# Patient Record
Sex: Female | Born: 1960 | Race: Black or African American | Hispanic: No | Marital: Married | Smoking: Former smoker
Health system: Southern US, Community
[De-identification: ages and names within clinical notes are randomized; demographics above are authoritative.]

## PROBLEM LIST (undated history)

## (undated) DIAGNOSIS — L309 Dermatitis, unspecified: Secondary | ICD-10-CM

## (undated) DIAGNOSIS — I2699 Other pulmonary embolism without acute cor pulmonale: Secondary | ICD-10-CM

## (undated) DIAGNOSIS — M1990 Unspecified osteoarthritis, unspecified site: Secondary | ICD-10-CM

## (undated) DIAGNOSIS — I10 Essential (primary) hypertension: Secondary | ICD-10-CM

## (undated) DIAGNOSIS — R011 Cardiac murmur, unspecified: Secondary | ICD-10-CM

## (undated) DIAGNOSIS — K5790 Diverticulosis of intestine, part unspecified, without perforation or abscess without bleeding: Secondary | ICD-10-CM

## (undated) DIAGNOSIS — N63 Unspecified lump in unspecified breast: Secondary | ICD-10-CM

## (undated) DIAGNOSIS — D649 Anemia, unspecified: Secondary | ICD-10-CM

## (undated) DIAGNOSIS — I82409 Acute embolism and thrombosis of unspecified deep veins of unspecified lower extremity: Secondary | ICD-10-CM

## (undated) DIAGNOSIS — D259 Leiomyoma of uterus, unspecified: Secondary | ICD-10-CM

## (undated) DIAGNOSIS — D4860 Neoplasm of uncertain behavior of unspecified breast: Secondary | ICD-10-CM

## (undated) HISTORY — DX: Anemia, unspecified: D64.9

## (undated) HISTORY — DX: Unspecified lump in unspecified breast: N63.0

## (undated) HISTORY — DX: Essential (primary) hypertension: I10

## (undated) HISTORY — DX: Other pulmonary embolism without acute cor pulmonale: I26.99

## (undated) HISTORY — DX: Neoplasm of uncertain behavior of unspecified breast: D48.60

## (undated) HISTORY — DX: Acute embolism and thrombosis of unspecified deep veins of unspecified lower extremity: I82.409

## (undated) HISTORY — DX: Leiomyoma of uterus, unspecified: D25.9

## (undated) HISTORY — PX: CHOLECYSTECTOMY: SHX55

---

## 1999-05-05 ENCOUNTER — Encounter: Payer: Self-pay | Admitting: Emergency Medicine

## 1999-05-05 ENCOUNTER — Emergency Department (HOSPITAL_COMMUNITY): Admission: EM | Admit: 1999-05-05 | Discharge: 1999-05-05 | Payer: Self-pay | Admitting: Emergency Medicine

## 1999-07-09 ENCOUNTER — Emergency Department (HOSPITAL_COMMUNITY): Admission: EM | Admit: 1999-07-09 | Discharge: 1999-07-09 | Payer: Self-pay

## 1999-08-21 DIAGNOSIS — D259 Leiomyoma of uterus, unspecified: Secondary | ICD-10-CM

## 1999-08-21 HISTORY — DX: Leiomyoma of uterus, unspecified: D25.9

## 2000-01-04 ENCOUNTER — Ambulatory Visit (HOSPITAL_COMMUNITY): Admission: RE | Admit: 2000-01-04 | Discharge: 2000-01-04 | Payer: Self-pay | Admitting: Gastroenterology

## 2000-01-22 ENCOUNTER — Emergency Department (HOSPITAL_COMMUNITY): Admission: EM | Admit: 2000-01-22 | Discharge: 2000-01-22 | Payer: Self-pay | Admitting: Emergency Medicine

## 2000-01-22 ENCOUNTER — Encounter: Payer: Self-pay | Admitting: Emergency Medicine

## 2000-02-04 ENCOUNTER — Emergency Department (HOSPITAL_COMMUNITY): Admission: EM | Admit: 2000-02-04 | Discharge: 2000-02-05 | Payer: Self-pay | Admitting: Emergency Medicine

## 2000-07-18 ENCOUNTER — Encounter: Payer: Self-pay | Admitting: Emergency Medicine

## 2000-07-18 ENCOUNTER — Emergency Department (HOSPITAL_COMMUNITY): Admission: EM | Admit: 2000-07-18 | Discharge: 2000-07-18 | Payer: Self-pay | Admitting: Emergency Medicine

## 2000-12-27 ENCOUNTER — Encounter: Payer: Self-pay | Admitting: Emergency Medicine

## 2000-12-27 ENCOUNTER — Observation Stay (HOSPITAL_COMMUNITY): Admission: EM | Admit: 2000-12-27 | Discharge: 2000-12-27 | Payer: Self-pay | Admitting: Emergency Medicine

## 2001-04-11 ENCOUNTER — Emergency Department (HOSPITAL_COMMUNITY): Admission: EM | Admit: 2001-04-11 | Discharge: 2001-04-11 | Payer: Self-pay | Admitting: Unknown Physician Specialty

## 2001-09-02 ENCOUNTER — Emergency Department (HOSPITAL_COMMUNITY): Admission: EM | Admit: 2001-09-02 | Discharge: 2001-09-03 | Payer: Self-pay | Admitting: Emergency Medicine

## 2002-01-29 ENCOUNTER — Emergency Department (HOSPITAL_COMMUNITY): Admission: EM | Admit: 2002-01-29 | Discharge: 2002-01-30 | Payer: Self-pay | Admitting: Emergency Medicine

## 2002-02-06 ENCOUNTER — Emergency Department (HOSPITAL_COMMUNITY): Admission: EM | Admit: 2002-02-06 | Discharge: 2002-02-06 | Payer: Self-pay | Admitting: Emergency Medicine

## 2002-03-28 ENCOUNTER — Emergency Department (HOSPITAL_COMMUNITY): Admission: EM | Admit: 2002-03-28 | Discharge: 2002-03-28 | Payer: Self-pay | Admitting: Emergency Medicine

## 2002-03-28 ENCOUNTER — Encounter: Payer: Self-pay | Admitting: Emergency Medicine

## 2002-05-15 ENCOUNTER — Inpatient Hospital Stay (HOSPITAL_COMMUNITY): Admission: EM | Admit: 2002-05-15 | Discharge: 2002-05-16 | Payer: Self-pay | Admitting: Emergency Medicine

## 2002-05-15 ENCOUNTER — Encounter (INDEPENDENT_AMBULATORY_CARE_PROVIDER_SITE_OTHER): Payer: Self-pay | Admitting: Cardiology

## 2002-10-09 ENCOUNTER — Encounter: Payer: Self-pay | Admitting: *Deleted

## 2002-10-09 ENCOUNTER — Emergency Department (HOSPITAL_COMMUNITY): Admission: EM | Admit: 2002-10-09 | Discharge: 2002-10-09 | Payer: Self-pay | Admitting: *Deleted

## 2003-03-08 ENCOUNTER — Emergency Department (HOSPITAL_COMMUNITY): Admission: EM | Admit: 2003-03-08 | Discharge: 2003-03-08 | Payer: Self-pay | Admitting: Emergency Medicine

## 2003-06-10 ENCOUNTER — Encounter: Payer: Self-pay | Admitting: Emergency Medicine

## 2003-06-10 ENCOUNTER — Inpatient Hospital Stay (HOSPITAL_COMMUNITY): Admission: EM | Admit: 2003-06-10 | Discharge: 2003-06-15 | Payer: Self-pay | Admitting: Emergency Medicine

## 2003-06-19 ENCOUNTER — Emergency Department (HOSPITAL_COMMUNITY): Admission: EM | Admit: 2003-06-19 | Discharge: 2003-06-19 | Payer: Self-pay | Admitting: Emergency Medicine

## 2003-06-22 ENCOUNTER — Encounter: Admission: RE | Admit: 2003-06-22 | Discharge: 2003-06-22 | Payer: Self-pay | Admitting: Internal Medicine

## 2003-06-24 ENCOUNTER — Encounter: Admission: RE | Admit: 2003-06-24 | Discharge: 2003-06-24 | Payer: Self-pay | Admitting: Internal Medicine

## 2003-07-01 ENCOUNTER — Encounter: Admission: RE | Admit: 2003-07-01 | Discharge: 2003-07-01 | Payer: Self-pay | Admitting: Internal Medicine

## 2003-07-05 ENCOUNTER — Encounter: Admission: RE | Admit: 2003-07-05 | Discharge: 2003-07-05 | Payer: Self-pay | Admitting: Internal Medicine

## 2003-07-19 ENCOUNTER — Encounter: Admission: RE | Admit: 2003-07-19 | Discharge: 2003-07-19 | Payer: Self-pay | Admitting: Internal Medicine

## 2003-07-29 ENCOUNTER — Ambulatory Visit (HOSPITAL_COMMUNITY): Admission: RE | Admit: 2003-07-29 | Discharge: 2003-07-29 | Payer: Self-pay | Admitting: Internal Medicine

## 2003-07-29 ENCOUNTER — Encounter: Admission: RE | Admit: 2003-07-29 | Discharge: 2003-07-29 | Payer: Self-pay | Admitting: Internal Medicine

## 2003-08-05 ENCOUNTER — Encounter: Admission: RE | Admit: 2003-08-05 | Discharge: 2003-08-05 | Payer: Self-pay | Admitting: Internal Medicine

## 2003-08-19 ENCOUNTER — Encounter: Admission: RE | Admit: 2003-08-19 | Discharge: 2003-08-19 | Payer: Self-pay | Admitting: Internal Medicine

## 2003-09-01 ENCOUNTER — Encounter: Admission: RE | Admit: 2003-09-01 | Discharge: 2003-09-01 | Payer: Self-pay | Admitting: Internal Medicine

## 2003-09-01 ENCOUNTER — Ambulatory Visit (HOSPITAL_COMMUNITY): Admission: RE | Admit: 2003-09-01 | Discharge: 2003-09-01 | Payer: Self-pay | Admitting: Internal Medicine

## 2003-09-02 ENCOUNTER — Encounter: Admission: RE | Admit: 2003-09-02 | Discharge: 2003-09-02 | Payer: Self-pay | Admitting: Internal Medicine

## 2003-09-20 ENCOUNTER — Encounter: Admission: RE | Admit: 2003-09-20 | Discharge: 2003-09-20 | Payer: Self-pay | Admitting: Internal Medicine

## 2003-10-21 ENCOUNTER — Encounter: Admission: RE | Admit: 2003-10-21 | Discharge: 2003-10-21 | Payer: Self-pay | Admitting: Internal Medicine

## 2003-10-27 ENCOUNTER — Emergency Department (HOSPITAL_COMMUNITY): Admission: EM | Admit: 2003-10-27 | Discharge: 2003-10-28 | Payer: Self-pay | Admitting: Emergency Medicine

## 2003-11-19 ENCOUNTER — Other Ambulatory Visit: Admission: RE | Admit: 2003-11-19 | Discharge: 2003-11-19 | Payer: Self-pay | Admitting: *Deleted

## 2003-12-02 ENCOUNTER — Encounter: Admission: RE | Admit: 2003-12-02 | Discharge: 2003-12-02 | Payer: Self-pay | Admitting: Internal Medicine

## 2003-12-20 ENCOUNTER — Encounter: Admission: RE | Admit: 2003-12-20 | Discharge: 2003-12-20 | Payer: Self-pay | Admitting: Internal Medicine

## 2003-12-22 ENCOUNTER — Emergency Department (HOSPITAL_COMMUNITY): Admission: EM | Admit: 2003-12-22 | Discharge: 2003-12-22 | Payer: Self-pay | Admitting: Family Medicine

## 2003-12-29 ENCOUNTER — Encounter: Admission: RE | Admit: 2003-12-29 | Discharge: 2003-12-29 | Payer: Self-pay | Admitting: Internal Medicine

## 2004-01-13 ENCOUNTER — Encounter: Admission: RE | Admit: 2004-01-13 | Discharge: 2004-01-13 | Payer: Self-pay | Admitting: Internal Medicine

## 2004-01-24 ENCOUNTER — Emergency Department (HOSPITAL_COMMUNITY): Admission: EM | Admit: 2004-01-24 | Discharge: 2004-01-25 | Payer: Self-pay | Admitting: Emergency Medicine

## 2004-02-03 ENCOUNTER — Encounter: Admission: RE | Admit: 2004-02-03 | Discharge: 2004-02-03 | Payer: Self-pay | Admitting: Internal Medicine

## 2004-03-13 ENCOUNTER — Encounter: Admission: RE | Admit: 2004-03-13 | Discharge: 2004-03-13 | Payer: Self-pay | Admitting: Internal Medicine

## 2004-03-26 ENCOUNTER — Emergency Department (HOSPITAL_COMMUNITY): Admission: EM | Admit: 2004-03-26 | Discharge: 2004-03-27 | Payer: Self-pay | Admitting: Emergency Medicine

## 2004-05-15 ENCOUNTER — Ambulatory Visit: Payer: Self-pay | Admitting: Internal Medicine

## 2004-06-15 ENCOUNTER — Ambulatory Visit: Payer: Self-pay | Admitting: Internal Medicine

## 2004-07-03 ENCOUNTER — Ambulatory Visit: Payer: Self-pay | Admitting: Internal Medicine

## 2004-07-19 ENCOUNTER — Ambulatory Visit: Payer: Self-pay | Admitting: Internal Medicine

## 2004-08-07 ENCOUNTER — Ambulatory Visit: Payer: Self-pay | Admitting: Internal Medicine

## 2004-09-04 ENCOUNTER — Emergency Department (HOSPITAL_COMMUNITY): Admission: EM | Admit: 2004-09-04 | Discharge: 2004-09-05 | Payer: Self-pay | Admitting: Emergency Medicine

## 2004-09-05 ENCOUNTER — Ambulatory Visit: Payer: Self-pay | Admitting: Internal Medicine

## 2004-10-02 ENCOUNTER — Ambulatory Visit: Payer: Self-pay | Admitting: Internal Medicine

## 2004-11-16 ENCOUNTER — Ambulatory Visit: Payer: Self-pay | Admitting: Internal Medicine

## 2004-11-17 ENCOUNTER — Ambulatory Visit: Payer: Self-pay | Admitting: Internal Medicine

## 2004-11-17 ENCOUNTER — Ambulatory Visit (HOSPITAL_COMMUNITY): Admission: RE | Admit: 2004-11-17 | Discharge: 2004-11-17 | Payer: Self-pay | Admitting: Internal Medicine

## 2004-12-21 ENCOUNTER — Ambulatory Visit: Payer: Self-pay | Admitting: Internal Medicine

## 2005-01-11 ENCOUNTER — Ambulatory Visit (HOSPITAL_COMMUNITY): Admission: RE | Admit: 2005-01-11 | Discharge: 2005-01-11 | Payer: Self-pay | Admitting: Internal Medicine

## 2005-01-11 ENCOUNTER — Ambulatory Visit: Payer: Self-pay | Admitting: Internal Medicine

## 2005-01-18 ENCOUNTER — Ambulatory Visit (HOSPITAL_COMMUNITY): Admission: RE | Admit: 2005-01-18 | Discharge: 2005-01-18 | Payer: Self-pay | Admitting: Internal Medicine

## 2005-02-19 ENCOUNTER — Ambulatory Visit: Payer: Self-pay | Admitting: Internal Medicine

## 2005-03-12 ENCOUNTER — Ambulatory Visit: Payer: Self-pay | Admitting: Internal Medicine

## 2005-04-19 ENCOUNTER — Ambulatory Visit: Payer: Self-pay | Admitting: Internal Medicine

## 2005-06-08 ENCOUNTER — Ambulatory Visit: Payer: Self-pay | Admitting: Internal Medicine

## 2005-06-21 ENCOUNTER — Ambulatory Visit: Payer: Self-pay | Admitting: Internal Medicine

## 2005-06-26 ENCOUNTER — Ambulatory Visit: Payer: Self-pay | Admitting: Internal Medicine

## 2005-07-02 ENCOUNTER — Ambulatory Visit (HOSPITAL_COMMUNITY): Admission: RE | Admit: 2005-07-02 | Discharge: 2005-07-02 | Payer: Self-pay | Admitting: Internal Medicine

## 2005-07-02 ENCOUNTER — Ambulatory Visit: Payer: Self-pay | Admitting: Internal Medicine

## 2005-07-30 ENCOUNTER — Ambulatory Visit: Payer: Self-pay | Admitting: Internal Medicine

## 2005-08-08 ENCOUNTER — Encounter: Admission: RE | Admit: 2005-08-08 | Discharge: 2005-08-19 | Payer: Self-pay | Admitting: Internal Medicine

## 2005-08-08 ENCOUNTER — Emergency Department (HOSPITAL_COMMUNITY): Admission: EM | Admit: 2005-08-08 | Discharge: 2005-08-08 | Payer: Self-pay | Admitting: Emergency Medicine

## 2005-08-30 ENCOUNTER — Ambulatory Visit (HOSPITAL_COMMUNITY): Admission: RE | Admit: 2005-08-30 | Discharge: 2005-08-30 | Payer: Self-pay | Admitting: Internal Medicine

## 2005-08-30 ENCOUNTER — Ambulatory Visit: Payer: Self-pay | Admitting: Internal Medicine

## 2005-09-27 ENCOUNTER — Ambulatory Visit: Payer: Self-pay | Admitting: Internal Medicine

## 2005-10-01 ENCOUNTER — Emergency Department (HOSPITAL_COMMUNITY): Admission: EM | Admit: 2005-10-01 | Discharge: 2005-10-01 | Payer: Self-pay | Admitting: Family Medicine

## 2005-10-27 ENCOUNTER — Emergency Department (HOSPITAL_COMMUNITY): Admission: EM | Admit: 2005-10-27 | Discharge: 2005-10-27 | Payer: Self-pay | Admitting: Emergency Medicine

## 2005-11-01 ENCOUNTER — Ambulatory Visit: Payer: Self-pay | Admitting: Internal Medicine

## 2005-11-15 ENCOUNTER — Ambulatory Visit: Payer: Self-pay | Admitting: Internal Medicine

## 2005-11-26 ENCOUNTER — Ambulatory Visit: Payer: Self-pay | Admitting: Hospitalist

## 2005-12-25 ENCOUNTER — Ambulatory Visit: Payer: Self-pay | Admitting: Internal Medicine

## 2005-12-27 ENCOUNTER — Ambulatory Visit: Payer: Self-pay | Admitting: Internal Medicine

## 2006-01-28 ENCOUNTER — Ambulatory Visit: Payer: Self-pay | Admitting: Internal Medicine

## 2006-03-20 DIAGNOSIS — D4860 Neoplasm of uncertain behavior of unspecified breast: Secondary | ICD-10-CM

## 2006-03-20 HISTORY — DX: Neoplasm of uncertain behavior of unspecified breast: D48.60

## 2006-03-20 HISTORY — PX: MASTECTOMY, PARTIAL: SHX709

## 2006-03-27 ENCOUNTER — Encounter: Admission: RE | Admit: 2006-03-27 | Discharge: 2006-03-27 | Payer: Self-pay | Admitting: General Surgery

## 2006-03-28 ENCOUNTER — Ambulatory Visit (HOSPITAL_BASED_OUTPATIENT_CLINIC_OR_DEPARTMENT_OTHER): Admission: RE | Admit: 2006-03-28 | Discharge: 2006-03-28 | Payer: Self-pay | Admitting: General Surgery

## 2006-03-28 ENCOUNTER — Encounter (INDEPENDENT_AMBULATORY_CARE_PROVIDER_SITE_OTHER): Payer: Self-pay | Admitting: *Deleted

## 2006-05-29 DIAGNOSIS — J40 Bronchitis, not specified as acute or chronic: Secondary | ICD-10-CM | POA: Insufficient documentation

## 2006-05-29 DIAGNOSIS — Z86718 Personal history of other venous thrombosis and embolism: Secondary | ICD-10-CM

## 2006-05-29 DIAGNOSIS — D259 Leiomyoma of uterus, unspecified: Secondary | ICD-10-CM | POA: Insufficient documentation

## 2006-05-29 DIAGNOSIS — D509 Iron deficiency anemia, unspecified: Secondary | ICD-10-CM | POA: Insufficient documentation

## 2006-05-29 DIAGNOSIS — I10 Essential (primary) hypertension: Secondary | ICD-10-CM | POA: Insufficient documentation

## 2006-05-29 HISTORY — DX: Personal history of other venous thrombosis and embolism: Z86.718

## 2006-06-14 ENCOUNTER — Ambulatory Visit: Payer: Self-pay | Admitting: Internal Medicine

## 2006-06-14 ENCOUNTER — Encounter (INDEPENDENT_AMBULATORY_CARE_PROVIDER_SITE_OTHER): Payer: Self-pay | Admitting: Pulmonary Disease

## 2006-06-14 LAB — CONVERTED CEMR LAB
BUN: 12 mg/dL (ref 6–23)
CO2: 24 meq/L (ref 19–32)
Calcium: 9.3 mg/dL (ref 8.4–10.5)
Chloride: 103 meq/L (ref 96–112)
Creatinine, Ser: 0.93 mg/dL (ref 0.40–1.20)
Glucose, Bld: 85 mg/dL (ref 70–99)
Potassium: 3.7 meq/L (ref 3.5–5.3)
Sodium: 138 meq/L (ref 135–145)

## 2006-07-01 ENCOUNTER — Ambulatory Visit: Payer: Self-pay | Admitting: Hospitalist

## 2006-07-10 ENCOUNTER — Ambulatory Visit (HOSPITAL_COMMUNITY): Admission: RE | Admit: 2006-07-10 | Discharge: 2006-07-10 | Payer: Self-pay | Admitting: *Deleted

## 2006-07-24 ENCOUNTER — Emergency Department (HOSPITAL_COMMUNITY): Admission: EM | Admit: 2006-07-24 | Discharge: 2006-07-24 | Payer: Self-pay | Admitting: Emergency Medicine

## 2006-09-15 DIAGNOSIS — N920 Excessive and frequent menstruation with regular cycle: Secondary | ICD-10-CM | POA: Insufficient documentation

## 2006-09-19 ENCOUNTER — Encounter: Admission: RE | Admit: 2006-09-19 | Discharge: 2006-09-19 | Payer: Self-pay | Admitting: General Surgery

## 2006-10-02 ENCOUNTER — Emergency Department (HOSPITAL_COMMUNITY): Admission: EM | Admit: 2006-10-02 | Discharge: 2006-10-02 | Payer: Self-pay | Admitting: Emergency Medicine

## 2006-11-19 ENCOUNTER — Emergency Department (HOSPITAL_COMMUNITY): Admission: EM | Admit: 2006-11-19 | Discharge: 2006-11-19 | Payer: Self-pay | Admitting: Emergency Medicine

## 2006-12-03 ENCOUNTER — Ambulatory Visit: Payer: Self-pay | Admitting: Internal Medicine

## 2006-12-03 DIAGNOSIS — R05 Cough: Secondary | ICD-10-CM

## 2006-12-03 DIAGNOSIS — R058 Other specified cough: Secondary | ICD-10-CM | POA: Insufficient documentation

## 2007-02-13 ENCOUNTER — Inpatient Hospital Stay (HOSPITAL_COMMUNITY): Admission: AD | Admit: 2007-02-13 | Discharge: 2007-02-15 | Payer: Self-pay | Admitting: Hospitalist

## 2007-02-13 ENCOUNTER — Ambulatory Visit: Payer: Self-pay | Admitting: Internal Medicine

## 2007-02-13 ENCOUNTER — Encounter (INDEPENDENT_AMBULATORY_CARE_PROVIDER_SITE_OTHER): Payer: Self-pay | Admitting: *Deleted

## 2007-02-13 DIAGNOSIS — R002 Palpitations: Secondary | ICD-10-CM | POA: Insufficient documentation

## 2007-02-14 ENCOUNTER — Encounter (INDEPENDENT_AMBULATORY_CARE_PROVIDER_SITE_OTHER): Payer: Self-pay | Admitting: Hospitalist

## 2007-02-16 ENCOUNTER — Encounter (INDEPENDENT_AMBULATORY_CARE_PROVIDER_SITE_OTHER): Payer: Self-pay | Admitting: *Deleted

## 2007-02-17 ENCOUNTER — Ambulatory Visit: Payer: Self-pay | Admitting: Internal Medicine

## 2007-02-17 LAB — CONVERTED CEMR LAB: INR: 0.9

## 2007-03-03 ENCOUNTER — Ambulatory Visit: Payer: Self-pay | Admitting: Internal Medicine

## 2007-03-03 LAB — CONVERTED CEMR LAB: INR: 1.5

## 2007-03-10 ENCOUNTER — Ambulatory Visit: Payer: Self-pay | Admitting: Internal Medicine

## 2007-03-10 LAB — CONVERTED CEMR LAB: INR: 1.7

## 2007-03-31 ENCOUNTER — Ambulatory Visit: Payer: Self-pay | Admitting: *Deleted

## 2007-03-31 LAB — CONVERTED CEMR LAB: INR: 1.9

## 2007-04-11 ENCOUNTER — Telehealth: Payer: Self-pay | Admitting: *Deleted

## 2007-04-23 ENCOUNTER — Telehealth: Payer: Self-pay | Admitting: *Deleted

## 2007-04-23 ENCOUNTER — Telehealth (INDEPENDENT_AMBULATORY_CARE_PROVIDER_SITE_OTHER): Payer: Self-pay | Admitting: *Deleted

## 2007-05-05 ENCOUNTER — Ambulatory Visit: Payer: Self-pay | Admitting: Internal Medicine

## 2007-05-05 LAB — CONVERTED CEMR LAB: INR: 1.2

## 2007-05-26 ENCOUNTER — Ambulatory Visit: Payer: Self-pay | Admitting: *Deleted

## 2007-05-26 LAB — CONVERTED CEMR LAB
Free T4: 1.24 ng/dL (ref 0.89–1.80)
TSH: 0.898 microintl units/mL (ref 0.350–5.50)

## 2007-06-27 ENCOUNTER — Telehealth: Payer: Self-pay | Admitting: *Deleted

## 2007-06-30 ENCOUNTER — Ambulatory Visit: Payer: Self-pay | Admitting: Hospitalist

## 2007-06-30 LAB — CONVERTED CEMR LAB: INR: 2.1

## 2007-07-03 ENCOUNTER — Ambulatory Visit: Payer: Self-pay | Admitting: Internal Medicine

## 2007-07-03 ENCOUNTER — Encounter (INDEPENDENT_AMBULATORY_CARE_PROVIDER_SITE_OTHER): Payer: Self-pay | Admitting: Internal Medicine

## 2007-07-03 DIAGNOSIS — R51 Headache: Secondary | ICD-10-CM | POA: Insufficient documentation

## 2007-07-03 DIAGNOSIS — R519 Headache, unspecified: Secondary | ICD-10-CM | POA: Insufficient documentation

## 2007-07-03 LAB — CONVERTED CEMR LAB
ALT: 31 units/L (ref 0–35)
AST: 24 units/L (ref 0–37)
Albumin: 4.3 g/dL (ref 3.5–5.2)
Alkaline Phosphatase: 72 units/L (ref 39–117)
BUN: 9 mg/dL (ref 6–23)
CO2: 23 meq/L (ref 19–32)
Calcium: 9.2 mg/dL (ref 8.4–10.5)
Chloride: 101 meq/L (ref 96–112)
Creatinine, Ser: 1.09 mg/dL (ref 0.40–1.20)
Glucose, Bld: 82 mg/dL (ref 70–99)
Potassium: 3.9 meq/L (ref 3.5–5.3)
Sed Rate: 40 mm/hr — ABNORMAL HIGH (ref 0–22)
Sodium: 138 meq/L (ref 135–145)
TSH: 1.135 microintl units/mL (ref 0.350–5.50)
Total Bilirubin: 0.3 mg/dL (ref 0.3–1.2)
Total Protein: 7.5 g/dL (ref 6.0–8.3)

## 2007-07-15 ENCOUNTER — Telehealth: Payer: Self-pay | Admitting: Internal Medicine

## 2007-08-21 DIAGNOSIS — D649 Anemia, unspecified: Secondary | ICD-10-CM

## 2007-08-21 HISTORY — DX: Anemia, unspecified: D64.9

## 2007-09-15 ENCOUNTER — Ambulatory Visit: Payer: Self-pay | Admitting: Internal Medicine

## 2007-09-15 LAB — CONVERTED CEMR LAB: INR: 1.1

## 2007-09-29 ENCOUNTER — Ambulatory Visit: Payer: Self-pay | Admitting: Internal Medicine

## 2007-09-29 LAB — CONVERTED CEMR LAB: INR: 1.7

## 2007-10-27 ENCOUNTER — Ambulatory Visit: Payer: Self-pay | Admitting: Internal Medicine

## 2007-10-27 LAB — CONVERTED CEMR LAB: INR: 2

## 2007-11-13 ENCOUNTER — Encounter (INDEPENDENT_AMBULATORY_CARE_PROVIDER_SITE_OTHER): Payer: Self-pay | Admitting: Internal Medicine

## 2007-11-13 ENCOUNTER — Ambulatory Visit: Payer: Self-pay | Admitting: *Deleted

## 2007-11-13 DIAGNOSIS — R109 Unspecified abdominal pain: Secondary | ICD-10-CM | POA: Insufficient documentation

## 2007-11-13 DIAGNOSIS — R21 Rash and other nonspecific skin eruption: Secondary | ICD-10-CM | POA: Insufficient documentation

## 2007-11-16 LAB — CONVERTED CEMR LAB
ALT: 29 units/L (ref 0–35)
AST: 21 units/L (ref 0–37)
Albumin: 4.2 g/dL (ref 3.5–5.2)
Alkaline Phosphatase: 62 units/L (ref 39–117)
BUN: 12 mg/dL (ref 6–23)
Basophils Absolute: 0 10*3/uL (ref 0.0–0.1)
Basophils Relative: 0 % (ref 0–1)
CO2: 25 meq/L (ref 19–32)
Calcium: 9.2 mg/dL (ref 8.4–10.5)
Chloride: 104 meq/L (ref 96–112)
Creatinine, Ser: 1.05 mg/dL (ref 0.40–1.20)
Eosinophils Absolute: 0.1 10*3/uL (ref 0.0–0.7)
Eosinophils Relative: 2 % (ref 0–5)
Glucose, Bld: 80 mg/dL (ref 70–99)
HCT: 34.5 % — ABNORMAL LOW (ref 36.0–46.0)
Hemoglobin: 11.2 g/dL — ABNORMAL LOW (ref 12.0–15.0)
Lipase: 28 units/L (ref 0–75)
Lymphocytes Relative: 44 % (ref 12–46)
Lymphs Abs: 2.2 10*3/uL (ref 0.7–4.0)
MCHC: 32.5 g/dL (ref 30.0–36.0)
MCV: 85.2 fL (ref 78.0–100.0)
Monocytes Absolute: 0.4 10*3/uL (ref 0.1–1.0)
Monocytes Relative: 7 % (ref 3–12)
Neutro Abs: 2.4 10*3/uL (ref 1.7–7.7)
Neutrophils Relative %: 47 % (ref 43–77)
Platelets: 353 10*3/uL (ref 150–400)
Potassium: 3.7 meq/L (ref 3.5–5.3)
RBC: 4.05 M/uL (ref 3.87–5.11)
RDW: 14.8 % (ref 11.5–15.5)
Sodium: 139 meq/L (ref 135–145)
Total Bilirubin: 0.5 mg/dL (ref 0.3–1.2)
Total Protein: 7.5 g/dL (ref 6.0–8.3)
WBC: 5 10*3/uL (ref 4.0–10.5)

## 2007-11-29 ENCOUNTER — Emergency Department (HOSPITAL_COMMUNITY): Admission: EM | Admit: 2007-11-29 | Discharge: 2007-11-29 | Payer: Self-pay | Admitting: Emergency Medicine

## 2007-12-01 ENCOUNTER — Encounter (INDEPENDENT_AMBULATORY_CARE_PROVIDER_SITE_OTHER): Payer: Self-pay | Admitting: *Deleted

## 2008-02-02 ENCOUNTER — Ambulatory Visit: Payer: Self-pay | Admitting: Internal Medicine

## 2008-02-02 LAB — CONVERTED CEMR LAB: INR: 2.4

## 2008-02-17 ENCOUNTER — Telehealth (INDEPENDENT_AMBULATORY_CARE_PROVIDER_SITE_OTHER): Payer: Self-pay | Admitting: Internal Medicine

## 2008-02-18 DIAGNOSIS — N63 Unspecified lump in unspecified breast: Secondary | ICD-10-CM

## 2008-02-18 HISTORY — DX: Unspecified lump in unspecified breast: N63.0

## 2008-03-01 ENCOUNTER — Inpatient Hospital Stay (HOSPITAL_COMMUNITY): Admission: EM | Admit: 2008-03-01 | Discharge: 2008-03-02 | Payer: Self-pay | Admitting: Emergency Medicine

## 2008-03-01 ENCOUNTER — Ambulatory Visit: Payer: Self-pay | Admitting: Cardiovascular Disease

## 2008-03-02 ENCOUNTER — Encounter: Payer: Self-pay | Admitting: Cardiovascular Disease

## 2008-03-08 ENCOUNTER — Ambulatory Visit: Payer: Self-pay | Admitting: Internal Medicine

## 2008-03-08 ENCOUNTER — Encounter (INDEPENDENT_AMBULATORY_CARE_PROVIDER_SITE_OTHER): Payer: Self-pay | Admitting: Internal Medicine

## 2008-03-08 ENCOUNTER — Ambulatory Visit: Payer: Self-pay | Admitting: *Deleted

## 2008-03-08 DIAGNOSIS — N63 Unspecified lump in unspecified breast: Secondary | ICD-10-CM | POA: Insufficient documentation

## 2008-03-08 LAB — CONVERTED CEMR LAB
ALT: 21 units/L (ref 0–35)
AST: 16 units/L (ref 0–37)
Albumin: 4.1 g/dL (ref 3.5–5.2)
Alkaline Phosphatase: 64 units/L (ref 39–117)
BUN: 9 mg/dL (ref 6–23)
CO2: 25 meq/L (ref 19–32)
Calcium: 9 mg/dL (ref 8.4–10.5)
Chloride: 104 meq/L (ref 96–112)
Creatinine, Ser: 1.06 mg/dL (ref 0.40–1.20)
Glucose, Bld: 76 mg/dL (ref 70–99)
INR: 2.1
Potassium: 3.5 meq/L (ref 3.5–5.3)
Sodium: 140 meq/L (ref 135–145)
Total Bilirubin: 0.2 mg/dL — ABNORMAL LOW (ref 0.3–1.2)
Total Protein: 6.9 g/dL (ref 6.0–8.3)

## 2008-03-11 ENCOUNTER — Encounter: Admission: RE | Admit: 2008-03-11 | Discharge: 2008-03-11 | Payer: Self-pay | Admitting: Internal Medicine

## 2008-03-22 ENCOUNTER — Ambulatory Visit: Payer: Self-pay | Admitting: Infectious Diseases

## 2008-03-22 LAB — CONVERTED CEMR LAB: INR: 2.8

## 2008-03-26 ENCOUNTER — Ambulatory Visit (HOSPITAL_COMMUNITY): Admission: RE | Admit: 2008-03-26 | Discharge: 2008-03-26 | Payer: Self-pay | Admitting: Obstetrics & Gynecology

## 2008-04-05 ENCOUNTER — Ambulatory Visit: Payer: Self-pay

## 2008-04-05 ENCOUNTER — Encounter (INDEPENDENT_AMBULATORY_CARE_PROVIDER_SITE_OTHER): Payer: Self-pay | Admitting: Internal Medicine

## 2008-04-16 ENCOUNTER — Ambulatory Visit: Payer: Self-pay

## 2008-05-24 ENCOUNTER — Ambulatory Visit: Payer: Self-pay | Admitting: Internal Medicine

## 2008-05-24 LAB — CONVERTED CEMR LAB: INR: 1

## 2008-07-19 ENCOUNTER — Ambulatory Visit: Payer: Self-pay | Admitting: Internal Medicine

## 2008-07-19 LAB — CONVERTED CEMR LAB: INR: 1.7

## 2008-07-22 ENCOUNTER — Telehealth: Payer: Self-pay | Admitting: *Deleted

## 2008-10-10 ENCOUNTER — Encounter: Payer: Self-pay | Admitting: Emergency Medicine

## 2008-10-11 ENCOUNTER — Observation Stay (HOSPITAL_COMMUNITY): Admission: EM | Admit: 2008-10-11 | Discharge: 2008-10-12 | Payer: Self-pay | Admitting: Internal Medicine

## 2008-10-11 ENCOUNTER — Ambulatory Visit: Payer: Self-pay | Admitting: Internal Medicine

## 2008-10-11 ENCOUNTER — Encounter: Payer: Self-pay | Admitting: *Deleted

## 2008-10-11 LAB — CONVERTED CEMR LAB
Cholesterol: 166 mg/dL
HDL: 37 mg/dL
LDL Cholesterol: 116 mg/dL
Triglycerides: 67 mg/dL

## 2008-10-12 ENCOUNTER — Encounter: Payer: Self-pay | Admitting: *Deleted

## 2008-10-13 ENCOUNTER — Telehealth (INDEPENDENT_AMBULATORY_CARE_PROVIDER_SITE_OTHER): Payer: Self-pay | Admitting: Internal Medicine

## 2008-10-14 ENCOUNTER — Ambulatory Visit: Payer: Self-pay | Admitting: Internal Medicine

## 2008-10-14 ENCOUNTER — Encounter (INDEPENDENT_AMBULATORY_CARE_PROVIDER_SITE_OTHER): Payer: Self-pay | Admitting: Internal Medicine

## 2008-10-14 LAB — CONVERTED CEMR LAB: INR: 1.6

## 2008-10-15 ENCOUNTER — Encounter: Payer: Self-pay | Admitting: *Deleted

## 2008-10-18 ENCOUNTER — Ambulatory Visit: Payer: Self-pay | Admitting: Internal Medicine

## 2008-10-18 LAB — CONVERTED CEMR LAB: INR: 1.7

## 2008-11-08 ENCOUNTER — Ambulatory Visit: Payer: Self-pay | Admitting: Internal Medicine

## 2008-11-08 LAB — CONVERTED CEMR LAB: INR: 3.8

## 2009-03-28 ENCOUNTER — Ambulatory Visit: Payer: Self-pay | Admitting: Internal Medicine

## 2009-03-28 LAB — CONVERTED CEMR LAB: INR: 1

## 2009-04-05 ENCOUNTER — Ambulatory Visit: Payer: Self-pay | Admitting: Internal Medicine

## 2009-04-05 ENCOUNTER — Encounter (INDEPENDENT_AMBULATORY_CARE_PROVIDER_SITE_OTHER): Payer: Self-pay | Admitting: Internal Medicine

## 2009-04-05 DIAGNOSIS — M2550 Pain in unspecified joint: Secondary | ICD-10-CM | POA: Insufficient documentation

## 2009-04-07 LAB — CONVERTED CEMR LAB
ALT: 55 units/L — ABNORMAL HIGH (ref 0–35)
AST: 30 units/L (ref 0–37)
Albumin: 4.2 g/dL (ref 3.5–5.2)
Alkaline Phosphatase: 63 units/L (ref 39–117)
Anti Nuclear Antibody(ANA): NEGATIVE
BUN: 13 mg/dL (ref 6–23)
CO2: 19 meq/L (ref 19–32)
Calcium: 9.5 mg/dL (ref 8.4–10.5)
Chloride: 105 meq/L (ref 96–112)
Creatinine, Ser: 1.01 mg/dL (ref 0.40–1.20)
Glucose, Bld: 76 mg/dL (ref 70–99)
Potassium: 3.7 meq/L (ref 3.5–5.3)
Rhuematoid fact SerPl-aCnc: 21 intl units/mL — ABNORMAL HIGH (ref 0–20)
Sodium: 141 meq/L (ref 135–145)
Total Bilirubin: 0.4 mg/dL (ref 0.3–1.2)
Total Protein: 7.3 g/dL (ref 6.0–8.3)

## 2009-04-12 ENCOUNTER — Encounter: Admission: RE | Admit: 2009-04-12 | Discharge: 2009-04-12 | Payer: Self-pay | Admitting: Internal Medicine

## 2009-04-12 LAB — HM MAMMOGRAPHY

## 2009-04-20 ENCOUNTER — Encounter (INDEPENDENT_AMBULATORY_CARE_PROVIDER_SITE_OTHER): Payer: Self-pay | Admitting: Internal Medicine

## 2009-05-05 ENCOUNTER — Encounter (INDEPENDENT_AMBULATORY_CARE_PROVIDER_SITE_OTHER): Payer: Self-pay | Admitting: Internal Medicine

## 2009-05-12 LAB — HM COLONOSCOPY

## 2010-01-11 ENCOUNTER — Ambulatory Visit: Payer: Self-pay | Admitting: Internal Medicine

## 2010-01-11 LAB — CONVERTED CEMR LAB
ALT: 21 units/L (ref 0–35)
AST: 22 units/L (ref 0–37)
Albumin: 4 g/dL (ref 3.5–5.2)
Alkaline Phosphatase: 51 units/L (ref 39–117)
Anti Nuclear Antibody(ANA): NEGATIVE
BUN: 10 mg/dL (ref 6–23)
CO2: 25 meq/L (ref 19–32)
Calcium: 8.9 mg/dL (ref 8.4–10.5)
Chloride: 101 meq/L (ref 96–112)
Creatinine, Ser: 0.86 mg/dL (ref 0.40–1.20)
Glucose, Bld: 76 mg/dL (ref 70–99)
INR: 1.4
Potassium: 3.6 meq/L (ref 3.5–5.3)
Rhuematoid fact SerPl-aCnc: <20 intl units/mL (ref 0–20)
Sed Rate: 23 mm/hr — ABNORMAL HIGH (ref 0–22)
Sodium: 136 meq/L (ref 135–145)
Total Bilirubin: 0.3 mg/dL (ref 0.3–1.2)
Total Protein: 6.9 g/dL (ref 6.0–8.3)

## 2010-01-12 ENCOUNTER — Telehealth (INDEPENDENT_AMBULATORY_CARE_PROVIDER_SITE_OTHER): Payer: Self-pay | Admitting: Pharmacist

## 2010-03-07 ENCOUNTER — Ambulatory Visit: Payer: Self-pay | Admitting: Internal Medicine

## 2010-03-07 ENCOUNTER — Encounter: Payer: Self-pay | Admitting: Internal Medicine

## 2010-03-07 LAB — CONVERTED CEMR LAB
ALT: 22 units/L (ref 0–35)
AST: 18 units/L (ref 0–37)
Albumin: 3.6 g/dL (ref 3.5–5.2)
Alkaline Phosphatase: 54 units/L (ref 39–117)
BUN: 10 mg/dL (ref 6–23)
Basophils Absolute: 0 10*3/uL (ref 0.0–0.1)
Basophils Relative: 0 % (ref 0–1)
CO2: 28 meq/L (ref 19–32)
Calcium: 9.4 mg/dL (ref 8.4–10.5)
Chloride: 100 meq/L (ref 96–112)
Creatinine, Ser: 0.91 mg/dL (ref 0.40–1.20)
Eosinophils Absolute: 0.1 10*3/uL (ref 0.0–0.7)
Eosinophils Relative: 1 % (ref 0–5)
Glucose, Bld: 79 mg/dL (ref 70–99)
HCT: 33.5 % — ABNORMAL LOW (ref 36.0–46.0)
Hemoglobin: 11.3 g/dL — ABNORMAL LOW (ref 12.0–15.0)
INR: 0.91 (ref <1.50)
Lymphocytes Relative: 44 % (ref 12–46)
Lymphs Abs: 2.7 10*3/uL (ref 0.7–4.0)
MCHC: 33.8 g/dL (ref 30.0–36.0)
MCV: 91.8 fL (ref >=78.0)
Monocytes Absolute: 0.5 10*3/uL (ref 0.1–1.0)
Monocytes Relative: 7 % (ref 3–12)
Neutro Abs: 3 10*3/uL (ref 1.7–7.7)
Neutrophils Relative %: 48 % (ref 43–77)
Platelets: 271 10*3/uL (ref 150–400)
Potassium: 3.5 meq/L (ref 3.5–5.3)
Prothrombin Time: 12.2 s (ref 11.6–15.2)
RBC: 3.65 M/uL — ABNORMAL LOW (ref 3.87–5.11)
RDW: 15.1 % (ref 11.5–15.5)
Sodium: 135 meq/L (ref 135–145)
Total Bilirubin: 0.4 mg/dL (ref 0.3–1.2)
Total Protein: 7 g/dL (ref 6.0–8.3)
WBC: 6.3 10*3/uL (ref 4.0–10.5)
aPTT: 28 s (ref 24–37)

## 2010-03-16 ENCOUNTER — Ambulatory Visit (HOSPITAL_COMMUNITY): Admission: RE | Admit: 2010-03-16 | Discharge: 2010-03-16 | Payer: Self-pay | Admitting: Obstetrics

## 2010-03-20 ENCOUNTER — Encounter: Admission: RE | Admit: 2010-03-20 | Discharge: 2010-03-20 | Payer: Self-pay | Admitting: Obstetrics

## 2010-03-29 ENCOUNTER — Telehealth: Payer: Self-pay | Admitting: Internal Medicine

## 2010-03-29 ENCOUNTER — Encounter: Payer: Self-pay | Admitting: Internal Medicine

## 2010-04-05 ENCOUNTER — Ambulatory Visit: Payer: Self-pay | Admitting: Internal Medicine

## 2010-04-13 ENCOUNTER — Encounter: Payer: Self-pay | Admitting: Obstetrics

## 2010-04-13 ENCOUNTER — Inpatient Hospital Stay (HOSPITAL_COMMUNITY): Admission: RE | Admit: 2010-04-13 | Discharge: 2010-04-21 | Payer: Self-pay | Admitting: Obstetrics

## 2010-04-13 HISTORY — PX: ABDOMINAL HYSTERECTOMY: SHX81

## 2010-04-21 ENCOUNTER — Telehealth: Payer: Self-pay | Admitting: Internal Medicine

## 2010-04-23 ENCOUNTER — Telehealth: Payer: Self-pay | Admitting: Internal Medicine

## 2010-04-24 ENCOUNTER — Telehealth (INDEPENDENT_AMBULATORY_CARE_PROVIDER_SITE_OTHER): Payer: Self-pay | Admitting: Pharmacist

## 2010-04-25 ENCOUNTER — Telehealth (INDEPENDENT_AMBULATORY_CARE_PROVIDER_SITE_OTHER): Payer: Self-pay | Admitting: Pharmacist

## 2010-04-27 ENCOUNTER — Telehealth (INDEPENDENT_AMBULATORY_CARE_PROVIDER_SITE_OTHER): Payer: Self-pay | Admitting: Pharmacist

## 2010-09-09 DIAGNOSIS — Z7901 Long term (current) use of anticoagulants: Secondary | ICD-10-CM

## 2010-09-09 DIAGNOSIS — Z86718 Personal history of other venous thrombosis and embolism: Secondary | ICD-10-CM

## 2010-09-10 ENCOUNTER — Encounter: Payer: Self-pay | Admitting: Internal Medicine

## 2010-09-19 NOTE — Assessment & Plan Note (Signed)
Summary: 261.CFB  Anticoagulant Therapy Managed by: Barbera Setters. Janie Morning  PharmD CACP PCP: Charlaine Dalton Attending: Lowella Bandy MD Indication 1: Deep vein thrombus Indication 2: Encounter for therapeutic drug monitoring  V58.83 Start date: 07/01/2003 Duration: Indefinite  Patient Assessment Reviewed by: Chancy Milroy PharmD  November 08, 2008 Medication review: verified warfarin dosage & schedule,verified previous prescription medications, verified doses & any changes, verified new medications, reviewed OTC medications, reviewed OTC health products-vitamins supplements etc Complications: none Dietary changes: none   Health status changes: none   Lifestyle changes: none   Recent/future hospitalizations: none   Recent/future procedures: none   Recent/future dental: none Patient Assessment Part 2:  Have you MISSED ANY DOSES or CHANGED TABLETS?  No missed Warfarin doses or changed tablets.  Have you had any BRUISING or BLEEDING ( nose or gum bleeds,blood in urine or stool)?  No reported bruising or bleeding in nose, gums, urine, stool.  Have you STARTED or STOPPED any MEDICATIONS, including OTC meds,herbals or supplements?  No other medications or herbal supplements were started or stopped.  Have you CHANGED your DIET, especially green vegetables,or ALCOHOL intake?  No changes in diet or alcohol intake.  Have you had any ILLNESSES or HOSPITALIZATIONS?  No reported illnesses or hospitalizations  Have you had any signs of CLOTTING?(chest discomfort,dizziness,shortness of breath,arms tingling,slurred speech,swelling or redness in leg)    No chest discomfort, dizziness, shortness of breath, tingling in arm, slurred speech, swelling, or redness in leg.     Treatment  Target INR: 2.0-3.0 INR: 3.8  Date: 11/08/2008 Regimen In:  155.0mg /week INR reflects regimen in: 3.8  New  Tablet strength: : 10mg  Regimen Out:     Sunday: 2 Tablet     Monday: 2 Tablet     Tuesday: 2 Tablet  Wednesday: 2 & 1/2 Tablet     Thursday: 2 Tablet      Friday: 2 Tablet     Saturday: 2 Tablet Total Weekly: 145.0mg /week mg  Next INR Due: 12/06/2008 Adjusted by: Barbera Setters. Alexandria Lodge III PharmD CACP   Return to anticoagulation clinic:  12/06/2008 Time of next visit: 1615

## 2010-09-19 NOTE — Assessment & Plan Note (Signed)
Summary: 261/CFB  Anticoagulant Therapy Managed by: Barbera Setters. Janie Morning  PharmD CACP PCP: Charlaine Dalton Attending: Lowella Bandy MD Indication 1: Deep vein thrombus Indication 2: Encounter for therapeutic drug monitoring  V58.83 Start date: 07/01/2003 Duration: Indefinite  Patient Assessment Reviewed by: Chancy Milroy PharmD  September 15, 2007 Medication review: verified warfarin dosage & schedule,verified previous prescription medications, verified doses & any changes, verified new medications, reviewed OTC medications, reviewed OTC health products-vitamins supplements etc Complications: none Dietary changes: none   Health status changes: none   Lifestyle changes: none   Recent/future hospitalizations: none   Recent/future procedures: none   Recent/future dental: none Patient Assessment Part 2:  Have you MISSED ANY DOSES or CHANGED TABLETS?  No missed Warfarin doses or changed tablets.  Have you had any BRUISING or BLEEDING ( nose or gum bleeds,blood in urine or stool)?  No reported bruising or bleeding in nose, gums, urine, stool.  Have you STARTED or STOPPED any MEDICATIONS, including OTC meds,herbals or supplements?  No other medications or herbal supplements were started or stopped.  Have you CHANGED your DIET, especially green vegetables,or ALCOHOL intake?  No changes in diet or alcohol intake.  Have you had any ILLNESSES or HOSPITALIZATIONS?  No reported illnesses or hospitalizations  Have you had any signs of CLOTTING?(chest discomfort,dizziness,shortness of breath,arms tingling,slurred speech,swelling or redness in leg)    No chest discomfort, dizziness, shortness of breath, tingling in arm, slurred speech, swelling, or redness in leg.     Treatment  Target INR: 2.0-3.0 INR: 1.1  Date: 09/15/2007 Regimen In:  155.0mg /week INR reflects regimen in: 1.1  New  Tablet strength: : 10mg  Regimen Out:     Sunday: 2 & 1/2 Tablet     Monday: 2 & 1/2 Tablet     Tuesday:  2 & 1/2 Tablet     Wednesday: 2 & 1/2 Tablet     Thursday: 2 & 1/2 Tablet      Friday: 2 & 1/2 Tablet     Saturday: 2 & 1/2 Tablet Total Weekly: 175.0mg /week mg  Next INR Due: 09/29/2007 Adjusted by: Barbera Setters. Alexandria Lodge III PharmD CACP   Return to anticoagulation clinic:  09/29/2007 Time of next visit: 1400

## 2010-09-19 NOTE — Assessment & Plan Note (Signed)
Summary: FU/ SB.   Vital Signs:  Patient Profile:   50 Years Old Female Height:     71 inches (180.34 cm) Weight:      273.1 pounds (124.14 kg) BMI:     38.23 Temp:     98.0 degrees F (36.67 degrees C) oral Pulse rate:   89 / minute BP sitting:   137 / 81  (right arm)  Pt. in pain?   no  Vitals Entered By: Chinita Pester RN (November 13, 2007 9:41 AM)              Is Patient Diabetic? No Nutritional Status BMI of > 30 = obese  Have you ever been in a relationship where you felt threatened, hurt or afraid?No   Does patient need assistance? Functional Status Self care Ambulation Normal     PCP:  Shannan Harper  Chief Complaint:  After eating; have abd pain.  History of Present Illness: 50 yo F who presents with some abd pain.  Sometimes when she eats about 10-15 minutes later she has abd pain.  She has a hx of fibroids.  She get nauseated but she doesn't thrown up.  It only happens when she eats and then takes her meds.  She eats first and then the takes her medicines - coumadin, maxzide and iron.  This has been going on for 2-3 months.  She only takes 1 pill of iron per day.  She has no hx of PUD or ulcers. She does have a hx of GERD, but she's not on any meds.  She's been on iron for 3 years.  Her coumadin was incresed about 3-4 months ago.  She always has black stools, most of the time they are formed, but sometimes loose and sometimes tarry.  No blood in urine and no abnormal vaginal bleeding.  She has her GB out.  The pain is located in the midepigastric area, but also down in a line from there to her lower abd.  The pain does not radiate.  She gets some SOB with the pain, no diaphoresis, she feels "drained" with the pain.  The pain lasts about 1-2 minutes and is severe and described as a sharp pain.  It's 10/10 at it's worst.  It's only associated with dinner which is when she takes her meds.  She can eat any other time without taking her meds and she has no pain.  But when she eats,  then takes her meds, she has the pain. She does not smoke or drink or use any illicit drugs. She has no pain now, nor has she eaten or drank anything yet today. She states she's had eczema since she was a child.  The rash she has on her body is sometimes itchy, is always present, never painful.  She sometimes gets it on her ears and face, but she does not get it on the insides of her elbows or behind the knees.    Updated Prior Medication List: MAXZIDE-25 37.5-25 MG TABS (TRIAMTERENE-HCTZ) Take 1 tablet by mouth once a day FERROUS SULFATE 325 (65 FE) MG TABS (FERROUS SULFATE) Take 1 tablet by mouth once a day COUMADIN 10 MG TAB (WARFARIN SODIUM) Take as directed. COUMADIN 5 MG TAB (WARFARIN SODIUM) Take as directed.  Current Allergies (reviewed today): No known allergies   Past Medical History:    Reviewed history from 05/29/2006 and no changes required:       Obesity       Hypertension  H/o multiple episodes of PE, DVTs 2004       Family h/o colon cancer       Bronchitis       H/o Dizziness, MRI/MRA neg, ENT f/u pen dec 2007       H/o menorrhagia with h/o fibroids       H/o iron def anemialast Hb 10.2 3/07       Left partial mastectomy with fibrocystic, phylloids tumour changes       S/p colonoscopy 2001 internal hemorrhois, no polyps       H/o self limited episode of Afib 2003  Past Surgical History:    Reviewed history from 05/29/2006 and no changes required:       Left partial mastectomy with fibrocystic, phylloids tumour changes    Risk Factors: Tobacco use:  never Passive smoke exposure:  no Drug use:  no HIV high-risk behavior:  no Alcohol use:  no Exercise:  no Seatbelt use:  100 %  Family History Risk Factors:    Family History of MI in females < 71 years old:  no    Family History of MI in males < 57 years old:  no   Review of Systems      See HPI   Physical Exam  General:     Well-developed,well-nourished,in no acute distress; alert,appropriate  and cooperative throughout examination Lungs:     Clear Heart:     normal rate, regular rhythm, and no murmur.   Abdomen:     soft, non-tender, normal bowel sounds, no distention, no masses, no guarding, no rigidity, and no rebound tenderness, obese.   Extremities:     No edema Neurologic:     alert & oriented X3.   Skin:     Small to medium sized patchy lesions on her chest, elbows and arms.  The areas appear dry and are flaky/scaly.  The skin on her elbows is thick and whitish. Cervical Nodes:     no anterior cervical adenopathy and no posterior cervical adenopathy.   Axillary Nodes:     no R axillary adenopathy and no L axillary adenopathy.   Psych:     Appropriate    Impression & Recommendations:  Problem # 1:  ABDOMINAL PAIN (ICD-789.00) Most likely related to the timing of her meds.  I suggested she try taking her meds before she eats to see if this would help her pain.  If anything, the iron pills would be the most likely culprit.  She was told to hold her iron if changing the order in which she takes her meds does not help relieve the pain.  Will check LFTs and a lipase to be complete.  No urgent need for imaging at this time as she is pain-free.  It may also be considered that she could have a retained CBD stone (despite being s/p chole) which can cause abd pain after eating, so the next step would be an abd [place] if needed.  Will also check a CBC to see if she needs to be on iron anymore.  Orders: T-Comprehensive Metabolic Panel [phone]) T-CBC w/Diff [phone]) T-Lipase [phone])   Problem # 2:  SKIN RASH (ICD-782.1) The pt was also seen by Dr. Aundria Rud and he and I do not think the rash she has is eczema - it is not in the usual places and looks more like psoriasis.  Will refer to dermatology for a more definitive dx and tx. Future Orders: Dermatology Referral (Derma) ... 11/25/2007  Problem # 3:  HYPERTENSION (ICD-401.9) BP at goal.  No changes made.  Will  check Cr and K today. Her updated medication list for this problem includes:    Maxzide-25 37.5-25 Mg Tabs (Triamterene-hctz) .Marland Kitchen... Take 1 tablet by mouth once a day  BP today: 137/81 Prior BP: 125/80 (07/03/2007)  Labs Reviewed: Creat: 1.09 (07/03/2007)   Complete Medication List: 1)  Maxzide-25 37.5-25 Mg Tabs (Triamterene-hctz) .... Take 1 tablet by mouth once a day 2)  Ferrous Sulfate 325 (65 Fe) Mg Tabs (Ferrous sulfate) .... Take 1 tablet by mouth once a day 3)  Coumadin 10 Mg Tab (Warfarin sodium) .... Take as directed. 4)  Coumadin 5 Mg Tab (Warfarin sodium) .... Take as directed.   Patient Instructions: 1)  Please schedule a follow-up appointment as needed. 2)  Try taking your medicines BEFORE you eat your meal. 3)  You will be called with any abnormalities in your bloodwork from today. 4)  You will be called with an appointment to see a Dermatologist, but if you don't hear from [place] in the next 2 weeks, call us back.    ]

## 2010-09-19 NOTE — Progress Notes (Signed)
Summary: Telemagagement of OP warfarin s/p dischg from hosp over Labor Da  Phone Note From Other Clinic   Caller: Nurse Call For: [name] PharmD CACP Reason for Call: Schedule Patient Appt, Medication Check, Diagnosis Check Request: Talk with Provider, Call Patient Action Taken: Phone Call Completed, Provider Notified, Patient called Details for Reason: HHA RN Called reporting INR = 2.85 on alt 15mg /10mg  warfarin every other day since discharge from hospital. Details of Complaint: Oozing at [facility] site. Dr. Gwenlyn Perking decreased dose yesterday to 10mg  warfarin by mouth once daily until seen in [facility] on Tuesday 6-Sep-11. Details of Action Taken: Patient called and apprised the same. Summary of Call: Telemanagement of OP warfarin s/p discharge from hospital over Labor Day weekend Initial call taken by: [name] PharmD

## 2010-09-19 NOTE — Assessment & Plan Note (Signed)
Summary: PATIENT COMING IN AT 2:00/ SB.  Anticoagulant Therapy Managed by: Barbera Setters. Janie Morning  PharmD CACP PCP: Charlaine Dalton Attending: Clydie Braun MD Indication 1: Deep vein thrombus Indication 2: Encounter for therapeutic drug monitoring  V58.83 Start date: 07/01/2003 Duration: Indefinite  Patient Assessment Reviewed by: Chancy Milroy PharmD  March 22, 2008 Medication review: verified warfarin dosage & schedule,verified previous prescription medications, verified doses & any changes, verified new medications, reviewed OTC medications, reviewed OTC health products-vitamins supplements etc Complications: none Dietary changes: none   Health status changes: none   Lifestyle changes: none   Recent/future hospitalizations: none   Recent/future procedures: none   Recent/future dental: none Patient Assessment Part 2:  Have you MISSED ANY DOSES or CHANGED TABLETS?  No missed Warfarin doses or changed tablets.  Have you had any BRUISING or BLEEDING ( nose or gum bleeds,blood in urine or stool)?  No reported bruising or bleeding in nose, gums, urine, stool.  Have you STARTED or STOPPED any MEDICATIONS, including OTC meds,herbals or supplements?  No other medications or herbal supplements were started or stopped.  Have you CHANGED your DIET, especially green vegetables,or ALCOHOL intake?  No changes in diet or alcohol intake.  Have you had any ILLNESSES or HOSPITALIZATIONS?  No reported illnesses or hospitalizations  Have you had any signs of CLOTTING?(chest discomfort,dizziness,shortness of breath,arms tingling,slurred speech,swelling or redness in leg)    No chest discomfort, dizziness, shortness of breath, tingling in arm, slurred speech, swelling, or redness in leg.     Treatment  Target INR: 2.0-3.0 INR: 2.8  Date: 03/22/2008 Regimen In:  120.0mg /week INR reflects regimen in: 2.8  New  Tablet strength: : 10mg  Next INR Due: 04/19/2008 Adjusted by: Barbera Setters. Alexandria Lodge III  PharmD CACP   Return to anticoagulation clinic:  04/19/2008 Time of next visit: 1445

## 2010-09-19 NOTE — Assessment & Plan Note (Signed)
Summary: medical clearance for surgery/gg  see last note   Vital Signs:  Patient profile:   50 year old female Height:      71 inches (180.34 cm) Weight:      254.0 pounds (115.45 kg) BMI:     35.55 Temp:     97.6 degrees F (36.44 degrees C) oral Pulse rate:   64 / minute BP sitting:   152 / 96  (left arm) Cuff size:   large  Vitals Entered By: Cynda Familia Duncan Dull) (April 05, 2010 2:55 PM) CC: med refill, out of bp meds, discuss upcoming surgery Is Patient Diabetic? No Nutritional Status BMI of > 30 = obese  Have you ever been in a relationship where you felt threatened, hurt or afraid?No   Does patient need assistance? Functional Status Self care Ambulation Normal   Primary Care Natalynn Pedone:  Zara Council MD  CC:  med refill, out of bp meds, and discuss upcoming surgery.  History of Present Illness: 50 y/o female with pmh as described in the EMR; who come to the clinic to discuss upcoming hysterectomy and to get refills on her BP meds.  Patient is really upset and altered due to the surgery, all the social changes coming in her life and also due to the cost and expenses she is passing through.   Patient is compliant with her medications and denies any CP, SOB, HA, nausea, vomiting, melena, hematochezia, blurred vision or any other complaints.  Patient BP is elevated today due to been out of her medications and also due to all the stress that she is dealing with at this time.  Patient had a medical clearance visit for her surgery during prior visit and will be seen again for pre-surgery labs on Monday next week.  Preventive Screening-Counseling & Management  Alcohol-Tobacco     Smoking Status: never     Year Quit: years     Passive Smoke Exposure: no  Problems Prior to Update: 1)  Pulmonary Embolism, Hx of  (ICD-V12.51) 2)  Hypertension  (ICD-401.9) 3)  Anemia-iron Deficiency  (ICD-280.9) 4)  Arthralgia  (ICD-719.40) 5)  Preventive Health Care  (ICD-V70.0) 6)   Lump or Mass in Breast  (ICD-611.72) 7)  Abdominal Pain  (ICD-789.00) 8)  Skin Rash  (ICD-782.1) 9)  Headache  (ICD-784.0) 10)  Hx of Palpitations  (ICD-785.1) 11)  Cough  (ICD-786.2) 12)  Menorrhagia  (ICD-626.2) 13)  Fibroids, Uterus  (ICD-218.9) 14)  Obesity Nos  (ICD-278.00) 15)  Bronchitis Nos  (ICD-490)  Current Problems (verified): 1)  Pulmonary Embolism, Hx of  (ICD-V12.51) 2)  Hypertension  (ICD-401.9) 3)  Anemia-iron Deficiency  (ICD-280.9) 4)  Arthralgia  (ICD-719.40) 5)  Preventive Health Care  (ICD-V70.0) 6)  Lump or Mass in Breast  (ICD-611.72) 7)  Abdominal Pain  (ICD-789.00) 8)  Skin Rash  (ICD-782.1) 9)  Headache  (ICD-784.0) 10)  Hx of Palpitations  (ICD-785.1) 11)  Cough  (ICD-786.2) 12)  Menorrhagia  (ICD-626.2) 13)  Fibroids, Uterus  (ICD-218.9) 14)  Obesity Nos  (ICD-278.00) 15)  Bronchitis Nos  (ICD-490)  Medications Prior to Update: 1)  Maxzide-25 37.5-25 Mg Tabs (Triamterene-Hctz) .... Take 1 Tablet By Mouth Once A Day 2)  Ferrous Sulfate 325 (65 Fe) Mg Tabs (Ferrous Sulfate) .... Take 1 Tablet By Mouth Once A Day 3)  Cvs Omeprazole 20 Mg Tbec (Omeprazole) .... Take One Tablet By Mouth Once Daily 4)  Warfarin Sodium 10 Mg Tabs (Warfarin Sodium) .... Tablet Strength: 10mg  Take As  Directed 5)  Proventil Hfa 108 (90 Base) Mcg/act  Aers (Albuterol Sulfate) .... 2 Puffs As Required For Breathing Problem.  Current Medications (verified): 1)  Maxzide-25 37.5-25 Mg Tabs (Triamterene-Hctz) .... Take 1 Tablet By Mouth Once A Day 2)  Ferrous Sulfate 325 (65 Fe) Mg Tabs (Ferrous Sulfate) .... Take 1 Tablet By Mouth Once A Day 3)  Cvs Omeprazole 20 Mg Tbec (Omeprazole) .... Take One Tablet By Mouth Once Daily 4)  Warfarin Sodium 10 Mg Tabs (Warfarin Sodium) .... Tablet Strength: 10mg  Take As Directed 5)  Proventil Hfa 108 (90 Base) Mcg/act  Aers (Albuterol Sulfate) .... 2 Puffs As Required For Breathing Problem.  Allergies (verified): No Known Drug  Allergies  Past History:  Past Medical History: Last updated: 05/29/2006 Obesity Hypertension H/o multiple episodes of PE, DVTs 2004 Family h/o colon cancer Bronchitis H/o Dizziness, MRI/MRA neg, ENT f/u pen dec 2007 H/o menorrhagia with h/o fibroids H/o iron def anemialast Hb 10.2 3/07 Left partial mastectomy with fibrocystic, phylloids tumour changes S/p colonoscopy 2001 internal hemorrhois, no polyps H/o self limited episode of Afib 2003  Past Surgical History: Last updated: 05/29/2006 Left partial mastectomy with fibrocystic, phylloids tumour changes  Risk Factors: Exercise: no (03/07/2010)  Risk Factors: Smoking Status: never (04/05/2010) Passive Smoke Exposure: no (04/05/2010)  Review of Systems       As per HPI.  Physical Exam  General:  alert, well-developed, well-nourished, and well-hydrated.   Lungs:  normal respiratory effort and normal breath sounds.   Heart:  normal rate and regular rhythm.   Abdomen:  soft and non-tender.   Neurologic:  alert & oriented X3, cranial nerves II-XII intact, strength normal in all extremities, and gait normal.     Impression & Recommendations:  Problem # 1:  PULMONARY EMBOLISM, HX OF (ICD-V12.51) Patient will continue tx for her PE and increased coagulation risk with coumadin long-life tx. Therapy would be follow by Dr. Alexandria Lodge in the coumadin clinic as soons as she had her hysterectomy done. Patient 's Gynecologist has been advised to start therapy with lovenox post surgery and to start dual therapy with coumadin priro to discharge, during follow up will determine if her INR is therapeutic and stable to stop the lovenox.  Her updated medication list for this problem includes:    Warfarin Sodium 10 Mg Tabs (Warfarin sodium) .Marland Kitchen... Tablet strength: 10mg  take as directed  Orders: No Charge Patient Arrived (NCPA0) (NCPA0)  Problem # 2:  ANEMIA-IRON DEFICIENCY (ICD-280.9) Secondary to menorrhagia due to fibroids. Patient will  received hysterectomy per Dr. Clearance Coots.  Her updated medication list for this problem includes:    Ferrous Sulfate 325 (65 Fe) Mg Tabs (Ferrous sulfate) .Marland Kitchen... Take 1 tablet by mouth once a day  Problem # 3:  HYPERTENSION (ICD-401.9) BP milddly elevated today, due to stress of current situation and alo because she ran out of her meds. will continue current regimen, will refill her meds and will ask her to follow a low sodium diet.  Her updated medication list for this problem includes:    Maxzide-25 37.5-25 Mg Tabs (Triamterene-hctz) .Marland Kitchen... Take 1 tablet by mouth once a day  Orders: No Charge Patient Arrived (NCPA0) (NCPA0)  Problem # 4:  FIBROIDS, UTERUS (ICD-218.9) Patient will received her hysterectomy next week by Dr. Clearance Coots. Hgb, electrolytes, renal function and coagulation panel within normal limits. She also had a recent EKG and cardiac enzymes check during last admission and everything was normal. She would also received pre-surgical visit during her appointmnet  on Monday, because of that will hold on any other studies at this time.  Orders: No Charge Patient Arrived (NCPA0) (NCPA0)  Complete Medication List: 1)  Maxzide-25 37.5-25 Mg Tabs (Triamterene-hctz) .... Take 1 tablet by mouth once a day 2)  Ferrous Sulfate 325 (65 Fe) Mg Tabs (Ferrous sulfate) .... Take 1 tablet by mouth once a day 3)  Cvs Omeprazole 20 Mg Tbec (Omeprazole) .... Take one tablet by mouth once daily 4)  Warfarin Sodium 10 Mg Tabs (Warfarin sodium) .... Tablet strength: 10mg  take as directed 5)  Proventil Hfa 108 (90 Base) Mcg/act Aers (Albuterol sulfate) .... 2 puffs as required for breathing problem.  Patient Instructions: 1)  Please schedule a follow-up appointment in 2 month. 2)  Follow with your gynecology appoinment. 3)  Take your meds as prescribed.

## 2010-09-19 NOTE — Consult Note (Signed)
Summary: [facility] [id number]  [facility] [id number]   Imported By: Florinda Marker 12/03/2007 15:17:36  _____________________________________________________________________  External Attachment:    Type:   Image     Comment:   External Document

## 2010-09-19 NOTE — Assessment & Plan Note (Signed)
Summary: STOMACH PAIN/SB/DS(HEAD)/DS   Vital Signs:  Patient Profile:   50 Years Old Female Height:     71 inches (180.34 cm) Weight:      277.0 pounds (125.91 kg) BMI:     38.77 Temp:     97.8 degrees F (36.56 degrees C) oral Pulse rate:   80 / minute BP sitting:   137 / 88  (right arm) Cuff size:   large  Pt. in pain?   yes    Location:   abdomen    Intensity:   6    Type:       ? UPSET  Vitals Entered By: Theotis Barrio NT II (February 02, 2008 2:38 PM)              Is Patient Diabetic? No Nutritional Status BMI of > 30 = obese  Have you ever been in a relationship where you felt threatened, hurt or afraid?No   Does patient need assistance? Functional Status Self care Ambulation Normal     PCP:  Shannan Harper  Chief Complaint:  ABD PAIN  FOR ABOUT 2 WEEKS .  History of Present Illness: This is a 50 year old woman with a past medical history of Obesity Hypertension H/o multiple episodes of PE, DVTs 2004 Family h/o colon cancer Bronchitis H/o Dizziness, MRI/MRA neg, ENT f/u pen dec 2007 H/o menorrhagia with h/o fibroids H/o iron def anemialast Hb 10.2 3/07 Left partial mastectomy with fibrocystic, phylloids tumour changes S/p colonoscopy 2001 internal hemorrhois, no polyps H/o self limited episode of Afib 2003   who is coming in today for a complaint of abdominal pain.  She has had this complaint for about 6 months now.  It started as mid-abdominal/epigastric pain only after dinner and thought to be from her medicine (she was always taking them before dinenr), but her pain has now progressed over the past few weeks to almost all day, not made worse or better by food.  It feels like "acidic" pain.  It has no radiation.  It is associated with nausea, but no vomiting, no regurgitation, no weight loss, no hematemesis, melena or hematochezia.  She also complains of worsening constipation for the past few weeks.  She has no fever.    Prior Medications Reviewed Using:  Patient Recall  Prior Medication List:  MAXZIDE-25 37.5-25 MG TABS (TRIAMTERENE-HCTZ) Take 1 tablet by mouth once a day FERROUS SULFATE 325 (65 FE) MG TABS (FERROUS SULFATE) Take 1 tablet by mouth once a day COUMADIN 10 MG TAB (WARFARIN SODIUM) Take as directed. COUMADIN 5 MG TAB (WARFARIN SODIUM) Take as directed.   Current Allergies (reviewed today): No known allergies     Risk Factors: Tobacco use:  never Passive smoke exposure:  no Drug use:  no HIV high-risk behavior:  no Alcohol use:  no Exercise:  no Seatbelt use:  100 %  Family History Risk Factors:    Family History of MI in females < 56 years old:  no    Family History of MI in males < 66 years old:  no   Review of Systems  General      Complains of loss of appetite.      Denies chills, fatigue, fever, and weight loss.  CV      Denies chest pain or discomfort and shortness of breath with exertion.  Resp      Denies cough and shortness of breath.  GI      See HPI  GU  Complains of abnormal vaginal bleeding.      Her menorrhagia is getting worse.  Known for uterine fibroids.   Physical Exam  General:     alert, well-developed, well-nourished, and well-hydrated.   Head:     normocephalic and atraumatic.   Eyes:     vision grossly intact.   Mouth:     pharynx pink and moist.   Lungs:     Normal respiratory effort, chest expands symmetrically. Lungs are clear to auscultation, no crackles or wheezes. Heart:     Normal rate and regular rhythm. S1 and S2 normal without gallop, murmur, click, rub or other extra sounds. Abdomen:     soft, no masses, no guarding, no rigidity, and bowel sounds hypoactive.   Has some tenderness in epigastrium and left upper quadrant, and some discomfort in mid abdomen.  Palpable stool. Extremities:     No clubbing, cyanosis, edema, or deformity noted with normal full range of motion of all joints.   Neurologic:     alert & oriented X3.      Impression &  Recommendations:  Problem # 1:  ABDOMINAL PAIN (ICD-789.00) Gastritis vs peptic ulcer, +/- constipation.  Will do a trial of omeprazole 40 mg two times a day for 30 days, and also suggested Miralax powder or other OTC laxatives for her constipation.  If her pain is not resolved in a month or if she develops any red flag signs (weight loss, rectal bleeding), will refer her to GI for endoscopy.  Problem # 2:  MENORRHAGIA (ICD-626.2) Refer to GYN. Orders: Gynecologic Referral (Gyn)   Complete Medication List: 1)  Maxzide-25 37.5-25 Mg Tabs (Triamterene-hctz) .... Take 1 tablet by mouth once a day 2)  Ferrous Sulfate 325 (65 Fe) Mg Tabs (Ferrous sulfate) .... Take 1 tablet by mouth once a day 3)  Coumadin 10 Mg Tab (Warfarin sodium) .... Take as directed. 4)  Coumadin 5 Mg Tab (Warfarin sodium) .... Take as directed. 5)  Omeprazole 40 Mg Cpdr (Omeprazole) .... Take 1 capsule by mouth two times a day   Patient Instructions: 1)  Please schedule a follow-up appointment in 1 month to see if your abdominal pain has responded to the medication   Prescriptions: OMEPRAZOLE 40 MG  CPDR (OMEPRAZOLE) Take 1 capsule by mouth two times a day  #60 x 2   Entered and Authorized by:   Ruben Reason. Candis Musa MD   Signed by:   Ruben Reason. Candis Musa MD on 02/02/2008   Method used:   Print then Give to Patient   RxID:   [id number]  ]

## 2010-09-19 NOTE — Progress Notes (Signed)
Summary: refill/ hla  Phone Note Refill Request Message from:  Fax from Pharmacy on July 15, 2007 2:04 PM  Refills Requested: Medication #1:  MAXZIDE-25 37.5-25 MG TABS Take 1 tablet by mouth once a day   Last Refilled: 10/10 Initial call taken by: Marin Roberts RN,  July 15, 2007 2:04 PM  Follow-up for Phone Call        Refill approved-nurse to complete Follow-up by: Ulyess Mort MD,  July 15, 2007 3:33 PM      Prescriptions: MAXZIDE-25 37.5-25 MG TABS (TRIAMTERENE-HCTZ) Take 1 tablet by mouth once a day  #30 x 4   Entered and Authorized by:   Ulyess Mort MD   Signed by:   Ulyess Mort MD on 07/15/2007   Method used:   Telephoned to ...       [facility] [phone]*       [address] [place], [place]  [address]       Ph: [phone] or [phone]       Fax: [phone]   RxID:   [id number]

## 2010-09-19 NOTE — Miscellaneous (Signed)
Clinical Lists Changes Hospital Discharge  Date of admission: 10/11/08  Date of discharge: 10/12/08  Brief reason for admission/active problems: Epigastric pain of unclear etiology and noncardiac chest pain.  Also, pt was subtherapeutic with regards to her chronic coumadin therapy.   Followup needed:  Pt will f/u with Dr.Groce on 10/14/08 for a PT/INR.  She will need a minimum of 5 days treatment of Lovenox, and 2 days of overlap once she becomes therapeutic on her coumadin.    The medication and problem lists have been updated.  Please see the dictated discharge summary for details.  Problems: Assessed PULMONARY EMBOLISM, HX OF as comment only - Pt was noncompliant with her coumadin and with her Dr. Alexandria Lodge follow-ups.  No documented DVT or PE during this hospitalization, and D-Dimer negative.  However, pt will be bridged with Lovenox as she is in needed of life-long anticoagulation 2/2 3 prior PEs.  Her updated medication list for this problem includes:    Warfarin Sodium 10 Mg Tabs (Warfarin sodium) .Marland Kitchen... Tablet strength: 10mg  take as directed    Lovenox 120 Mg/0.63ml Soln (Enoxaparin sodium) ..... Inject 115mg  subcutaneously twice daily  Medications: Added new medication of LOVENOX 120 MG/0.8ML SOLN (ENOXAPARIN SODIUM) Inject 115mg  subcutaneously twice daily - Signed Changed medication from OMEPRAZOLE 40 MG  CPDR (OMEPRAZOLE) Take 1 capsule by mouth two times a day to [facility] OMEPRAZOLE 20 MG TBEC (OMEPRAZOLE) Take one tablet by mouth once daily Rx of LOVENOX 120 MG/0.8ML SOLN (ENOXAPARIN SODIUM) Inject 115mg  subcutaneously twice daily;  #1 x 0;  Signed;  Entered by: Genia Del MD;  Authorized by: Genia Del MD;  Method used: Electronically to [facility] [phone]*, [address] [place], [place]  [address], Ph: [phone] or [phone], Fax: [phone] Observations: Added new observation of COUM DOSE: N/A (10/12/2008 12:30) Added new observation of COUM TOT WK: N/A (10/12/2008  12:30)    Prescriptions: LOVENOX 120 MG/0.8ML SOLN (ENOXAPARIN SODIUM) Inject 115mg  subcutaneously twice daily  #1 x 0   Entered and Authorized by:   Genia Del MD   Signed by:   Genia Del MD on 10/12/2008   Method used:   Electronically to        [facility] [phone]* (retail)       [address] [place], [place]  [address]       Ph: [phone] or [phone]       Fax: [phone]   RxID:   [id number]    Impression & Recommendations:  Problem # 1:  PULMONARY EMBOLISM, HX OF (ICD-V12.51) Pt was noncompliant with her coumadin and with her Dr. Alexandria Lodge follow-ups.  No documented DVT or PE during this hospitalization, and D-Dimer negative.  However, pt will be bridged with Lovenox as she is in needed of life-long anticoagulation 2/2 3 prior PEs.  Her updated medication list for this problem includes:    Warfarin Sodium 10 Mg Tabs (Warfarin sodium) .Marland Kitchen... Tablet strength: 10mg  take as directed    Lovenox 120 Mg/0.58ml Soln (Enoxaparin sodium) ..... Inject 115mg  subcutaneously twice daily   Complete Medication List: 1)  Maxzide-25 37.5-25 Mg Tabs (Triamterene-hctz) .... Take 1 tablet by mouth once a day 2)  Ferrous Sulfate 325 (65 Fe) Mg Tabs (Ferrous sulfate) .... Take 1 tablet by mouth once a day 3)  [facility] Omeprazole 20 Mg Tbec (Omeprazole) .... Take one tablet by mouth once daily 4)  Warfarin Sodium 10 Mg Tabs (Warfarin  sodium) .... Tablet strength: 10mg  take as directed 5)  Proventil Hfa 108 (90 Base) Mcg/act Aers (Albuterol sulfate) .... 2 puffs as required for breathing problem. 6)  Lovenox 120 Mg/0.38ml Soln (Enoxaparin sodium) .... Inject 115mg  subcutaneously twice daily  Anticoagulation Management Assessment/Plan:       Anticoagulation Management History:      Her last INR was 1.7.    Appended Document:  Pt is taking Coumadin 15mg  once daily at time of discharge.  This will be adjusted at time of follow-up with Dr. Alexandria Lodge.

## 2010-09-19 NOTE — Assessment & Plan Note (Signed)
Summary: COU/CH  Anticoagulant Therapy Managed by: Barbera Setters. Alexandria Lodge III  PharmD CACP PCP: Shannan Harper [facility] Attending: Chauncey Reading DO Indication 1: Deep vein thrombus Indication 2: Encounter for therapeutic drug monitoring  V58.83 Start date: 07/01/2003 Duration: Indefinite  Patient Assessment Reviewed by: Chancy Milroy PharmD  October 14, 2008 Medication review: verified warfarin dosage & schedule,verified previous prescription medications, verified doses & any changes, verified new medications, reviewed OTC medications, reviewed OTC health products-vitamins supplements etc Complications: none Dietary changes: none   Health status changes: none   Lifestyle changes: none   Recent/future hospitalizations: yes  Details: Admitted to hospital with questionable pneumonia/"shadow" on CT/CXRAY---for which the team commenced her upon treatment doses of LMWH b/c upon admission her INR was noted to be subtherapeutic. She frequently IS subtherapeutic because of suspected and even volunteered admission of non-compliance--often due to difficulty getting/paying for her medications she states. Recent/future procedures: none   Recent/future dental: none Patient Assessment Part 2:  Have you MISSED ANY DOSES or CHANGED TABLETS?  No missed Warfarin doses or changed tablets.  Have you had any BRUISING or BLEEDING ( nose or gum bleeds,blood in urine or stool)?  No reported bruising or bleeding in nose, gums, urine, stool.  Have you STARTED or STOPPED any MEDICATIONS, including OTC meds,herbals or supplements?  No other medications or herbal supplements were started or stopped.  Have you CHANGED your DIET, especially green vegetables,or ALCOHOL intake?  No changes in diet or alcohol intake.  Have you had any ILLNESSES or HOSPITALIZATIONS?  Yes. Discharged from hospital on 23-Feb-10.  Have you had any signs of CLOTTING?(chest discomfort,dizziness,shortness of breath,arms tingling,slurred speech,swelling or  redness in leg)    No chest discomfort, dizziness, shortness of breath, tingling in arm, slurred speech, swelling, or redness in leg.     Treatment  Target INR: 2.0-3.0 INR: 1.6  Date: 10/14/2008 Regimen In:  N/A INR reflects regimen in: 1.6  New  Tablet strength: : 10mg  Next INR Due: 10/18/2008 Adjusted by: Barbera Setters. Alexandria Lodge III PharmD CACP   Return to anticoagulation clinic:  10/18/2008 Time of next visit: 1430   Comments: Will dispense to her another 3 days of LMWH. Increase her warfarin dosing to previous dose (prior to admission) and repeat INR on 1-Mar-10

## 2010-09-19 NOTE — Consult Note (Signed)
Summary: [facility]  [facility]   Imported By: Florinda Marker 04/22/2009 15:26:25  _____________________________________________________________________  External Attachment:    Type:   Image     Comment:   External Document

## 2010-09-19 NOTE — Assessment & Plan Note (Signed)
Summary: resch from 09/08/cfb  Anticoagulant Therapy Managed by: Barbera Setters. [name]  PharmD CACP PCP: Shannan Harper Indication 1: Deep vein thrombus Indication 2: Encounter for therapeutic drug monitoring  V58.83 Start date: 07/01/2003 Duration: Indefinite  Patient Assessment Reviewed by: Chancy Milroy PharmD  May 05, 2007 Medication review: verified warfarin dosage & schedule, verified previous prescription medications, verified doses & any changes, verified new medications, reviewed OTC medications, reviewed OTC health products-vitamins supplements etc Complications: none Comments: When queried--states that she drinks a lot of Lipton's Green Tea beverage--possibley accounting for decreased INR response. Dietary changes: none   Health status changes: none   Lifestyle changes: none   Recent/future hospitalizations: none   Recent/future procedures: none   Recent/future dental: none Patient Assessment Part 2:  Have you MISSED ANY DOSES or CHANGED TABLETS?  No missed Warfarin doses or changed tablets.  Have you had any BRUISING or BLEEDING ( nose or gum bleeds,blood in urine or stool)?  No reported bruising or bleeding in nose, gums, urine, stool.  Have you STARTED or STOPPED any MEDICATIONS, including OTC meds,herbals or supplements?  No other medications or herbal supplements were started or stopped.  Have you CHANGED your DIET, especially green vegetables,or ALCOHOL intake?  No changes in diet or alcohol intake.  Have you had any ILLNESSES or HOSPITALIZATIONS?  No reported illnesses or hospitalizations  Have you had any signs of CLOTTING?(chest discomfort,dizziness,shortness of breath,arms tingling,slurred speech,swelling or redness in leg)    No chest discomfort, dizziness, shortness of breath, tingling in arm, slurred speech, swelling, or redness in leg.     Treatment  Target INR: 2.0-3.0 INR: 1.2  Date: 05/05/2007 Regimen In:  135.0mg /week INR reflects regimen in:  1.2  New  Tablet strength: : 5mg  Regimen Out:     Sunday: 4 Tablet     Monday: 4 Tablet     Tuesday: 4 Tablet     Wednesday: 4 Tablet     Thursday: 4 Tablet      Friday: 4 Tablet     Saturday: 4 Tablet Total Weekly: 140mg /wk mg  Next INR Due: 05/12/2007 Adjusted by: Barbera Setters. Alexandria Lodge III PharmD CACP   Return to anticoagulation clinic:  05/12/2007   Comments: Because she is out of her 10mg  warfarin---she is requesting dispensing of 5mg  warfarin tablets.

## 2010-09-19 NOTE — Progress Notes (Signed)
Summary: phone/gg  Phone Note From Other Clinic   Summary of Call: Received a call from Dr Coral Ceo with [facility] [phone] ) He wants to do a hysterectomy on Tom[place] and needs a letter of Medical Clearance before surgery.  Surgery is scheduled for 8/26. Also pt is on coumadin and he needs recommendation of when to stop, if pt needs lovenox after surgery. I will fax to his office [phone]  appointment scheduled for 8/17. pt aware.   Please have M/Cleanance faxed into Dr Clearance Coots. Initial call taken by: Merrie Roof RN,  March 29, 2010 10:33 AM  Follow-up for Phone Call        Please make sure patient has followup with coumadin clinic after surgery is done.   Thanks!!!     Appended Document: phone/gg Medical Clearance has been faxed to Dr Clearance Coots. Post surgical coumadin appointment made for 04/17/10 Dr Clearance Coots will call Dr Alexandria Lodge about when to stop and restart coumadin.

## 2010-09-19 NOTE — Progress Notes (Signed)
Summary: refill/gg  Phone Note Refill Request  on April 23, 2007 12:46 PM  Refills Requested: Medication #1:  COUMADIN 10 MG TAB Take as directed.. refill written on 03/31/07 but was not filled.  pt out of coumadin.  Can I refill today with one refill using the 8/11 Rx?  Initial call taken by: Merrie Roof RN,  April 23, 2007 12:46 PM  Follow-up for Phone Call        Can you ask Dr. Alexandria Lodge? ..................................................................Marland KitchenUlyess Mort MD  April 23, 2007 3:14 PM   Additional Follow-up for Phone Call Additional follow up Details #1::        okay to refill per Dr Alexandria Lodge Additional Follow-up by: Merrie Roof RN,  April 23, 2007 3:34 PM

## 2010-09-19 NOTE — Assessment & Plan Note (Signed)
Summary: COU/VS  Anticoagulant Therapy Managed by: Barbera Setters. Janie Morning  PharmD CACP PCP: Charlaine Dalton Attending: Manning Charity MD Indication 1: Deep vein thrombus Indication 2: Encounter for therapeutic drug monitoring  V58.83 Start date: 07/01/2003 Duration: Indefinite  Patient Assessment Reviewed by: Chancy Milroy PharmD  March 10, 2007 Medication review: verified warfarin dosage & schedule,verified previous prescription medications, verified doses & any changes, verified new medications, reviewed OTC medications, reviewed OTC health products-vitamins supplements etc Complications: none Dietary changes: none   Health status changes: none   Lifestyle changes: none   Recent/future hospitalizations: none   Recent/future procedures: none   Recent/future dental: none Patient Assessment Part 2:  Have you MISSED ANY DOSES or CHANGED TABLETS?  No missed Warfarin doses or changed tablets.  Have you had any BRUISING or BLEEDING ( nose or gum bleeds,blood in urine or stool)?  No reported bruising or bleeding in nose, gums, urine, stool.  Have you STARTED or STOPPED any MEDICATIONS, including OTC meds,herbals or supplements?  No other medications or herbal supplements were started or stopped.  Have you CHANGED your DIET, especially green vegetables,or ALCOHOL intake?  No changes in diet or alcohol intake.  Have you had any ILLNESSES or HOSPITALIZATIONS?  No reported illnesses or hospitalizations  Have you had any signs of CLOTTING?(chest discomfort,dizziness,shortness of breath,arms tingling,slurred speech,swelling or redness in leg)    No chest discomfort, dizziness, shortness of breath, tingling in arm, slurred speech, swelling, or redness in leg.     Treatment  Target INR: 2.0-3.0 INR: 1.7  Date: 03/10/2007 Regimen In:  115.0mg /week INR reflects regimen in: 1.7  New  Tablet strength: : 10mg  Regimen Out:     Sunday: 2 Tablet     Monday: 1 & 1/2 Tablet     Tuesday: 1  Tablet     Wednesday: 1 & 1/2 Tablet     Thursday: 2 Tablet      Friday: 1 & 1/2 Tablet     Saturday: 2 Tablet Total Weekly: 125.0mg /week mg  Next INR Due: 03/31/2007 Adjusted by: Barbera Setters. Alexandria Lodge III PharmD CACP   Return to anticoagulation clinic:  03/31/2007 Time of next visit: 1415

## 2010-09-19 NOTE — Assessment & Plan Note (Signed)
Summary: pt.inr per dr yerrabapu/cfb  Anticoagulant Therapy Managed by: Barbera Setters. Janie Morning  PharmD CACP PCP: Charlaine Dalton Attending: Ned Grace MD Indication 1: Deep vein thrombus Indication 2: Encounter for therapeutic drug monitoring  V58.83 Start date: 07/01/2003 Duration: Indefinite  Patient Assessment Reviewed by: Chancy Milroy PharmD  February 17, 2007 Medication review: verified warfarin dosage & schedule,verified previous prescription medications, verified doses & any changes, verified new medications, reviewed OTC medications, reviewed OTC health products-vitamins supplements etc Complications: none Dietary changes: none   Health status changes: none   Lifestyle changes: none   Recent/future hospitalizations: none   Recent/future procedures: none   Recent/future dental: none Patient Assessment Part 2:  Have you MISSED ANY DOSES or CHANGED TABLETS?  No missed Warfarin doses or changed tablets.  Have you had any BRUISING or BLEEDING ( nose or gum bleeds,blood in urine or stool)?  No reported bruising or bleeding in nose, gums, urine, stool.  Have you STARTED or STOPPED any MEDICATIONS, including OTC meds,herbals or supplements?  No other medications or herbal supplements were started or stopped.  Have you CHANGED your DIET, especially green vegetables,or ALCOHOL intake?  No changes in diet or alcohol intake.  Have you had any ILLNESSES or HOSPITALIZATIONS?  Recent hospitalization, discharged on Saturday 28-Jun-08 for suspicion of PE which was subsequently ruled out.    Have you had any signs of CLOTTING?(chest discomfort,dizziness,shortness of breath,arms tingling,slurred speech,swelling or redness in leg)    No chest discomfort, dizziness, shortness of breath, tingling in arm, slurred speech, swelling, or redness in leg.     Treatment  Target INR: 2.0-3.0 INR: 0.9  Date: 02/17/2007 INR reflects regimen in: 0.9  New  Tablet strength: : 5mg  Regimen Out:     Sunday:  3 Tablet     Monday: 2 & 1/2 Tablet     Tuesday: 3 Tablet     Wednesday: 2 & 1/2 Tablet     Thursday: 3 Tablet      Friday: 2 & 1/2 Tablet     Saturday: 3 Tablet Total Weekly: 97.5mg /week mg  Next INR Due: 02/24/2007 Adjusted by: Barbera Setters. Alexandria Lodge III PharmD CACP   Return to anticoagulation clinic:  02/24/2007 Time of next visit: 1430   Comments: Has been on 10mg  once daily since discharge. Her previous dosing history reveals that she was taking as much as 100mg /wk on several occasions.  I will increase to 97.5mg /wk between now and next visit on Monday 7-Jul-08.

## 2010-09-19 NOTE — Assessment & Plan Note (Signed)
Summary: COU/VS  Anticoagulant Therapy Managed by: Barbera Setters. Janie Morning  PharmD CACP PCP: Charlaine Dalton Attending: Lowella Bandy MD Indication 1: Deep vein thrombus Indication 2: Encounter for therapeutic drug monitoring  V58.83 Start date: 07/01/2003 Duration: Indefinite  Patient Assessment Reviewed by: Chancy Milroy PharmD  March 03, 2007 Medication review: verified warfarin dosage & schedule,verified previous prescription medications, verified doses & any changes, verified new medications, reviewed OTC medications, reviewed OTC health products-vitamins supplements etc Complications: none Dietary changes: none   Health status changes: none   Lifestyle changes: none   Recent/future hospitalizations: none   Recent/future procedures: none   Recent/future dental: none Patient Assessment Part 2:  Have you MISSED ANY DOSES or CHANGED TABLETS?  No missed Warfarin doses or changed tablets.  Have you had any BRUISING or BLEEDING ( nose or gum bleeds,blood in urine or stool)?  No reported bruising or bleeding in nose, gums, urine, stool.  Have you STARTED or STOPPED any MEDICATIONS, including OTC meds,herbals or supplements?  No other medications or herbal supplements were started or stopped.  Have you CHANGED your DIET, especially green vegetables,or ALCOHOL intake?  No changes in diet or alcohol intake.  Have you had any ILLNESSES or HOSPITALIZATIONS?  No reported illnesses or hospitalizations  Have you had any signs of CLOTTING?(chest discomfort,dizziness,shortness of breath,arms tingling,slurred speech,swelling or redness in leg)    No chest discomfort, dizziness, shortness of breath, tingling in arm, slurred speech, swelling, or redness in leg.     Treatment  Target INR: 2.0-3.0 INR: 1.5  Date: 03/03/2007 Regimen In:  97.5mg /week INR reflects regimen in: 1.5  New  Tablet strength: : 10mg  Regimen Out:     Sunday: 1 & 1/2 Tablet     Monday: 2 Tablet     Tuesday: 1 & 1/2  Tablet     Wednesday: 1 & 1/2 Tablet     Thursday: 2 Tablet      Friday: 1 & 1/2 Tablet     Saturday: 1 & 1/2 Tablet Total Weekly: 115.0mg /week mg  Next INR Due: 03/17/2007 Adjusted by: Barbera Setters. Alexandria Lodge III PharmD CACP   Return to anticoagulation clinic:  03/17/2007 Time of next visit: 1430

## 2010-09-19 NOTE — Assessment & Plan Note (Signed)
Summary: ACUTE-REF TO WOMEN'S FOR HEAVY CYCLE X 1 MONTH DOES NOT GO TO...   Vital Signs:  Patient profile:   50 year old female Height:      71 inches (180.34 cm) Weight:      254.7 pounds (115.77 kg) Temp:     97 degrees F (36.11 degrees C) oral Pulse rate:   71 / minute BP sitting:   122 / 77  (right arm) Cuff size:   large  Vitals Entered By: Theotis Barrio NT II (March 07, 2010 2:33 PM) CC: REQUEST  GYN REFERRAL  FOR HEAVY CYCLE  Is Patient Diabetic? No Nutritional Status BMI of > 30 = obese  Have you ever been in a relationship where you felt threatened, hurt or afraid?No   Does patient need assistance? Functional Status Self care Ambulation Normal Comments RQUEST GYN REFERRAL FOR HEAVY BLEEDING   Primary Care Provider:  Zara Council MD  CC:  REQUEST  GYN REFERRAL  FOR HEAVY CYCLE .  History of Present Illness: 50 y/o female with pmh as described in the EMR; who come to the clinic complaining of heavy menstrual bleeding for the last 4 weeks; patient was using higher dose of coumadin to put her INR into therapeutic range, but stopped at the moment the bleeding begun. She is currently having palpitations with activities, but denies dizziness, lightheadness or syncope.  Patient is compliant with her medications and denies any CP,SOB, HA, nausea, vomiting, melena, hematochezia,blurred vision or any other complaints.  Orthostatic vital signs were WNL at the office.  Preventive Screening-Counseling & Management  Alcohol-Tobacco     Smoking Status: never     Year Quit: years     Passive Smoke Exposure: no  Caffeine-Diet-Exercise     Does Patient Exercise: no  Problems Prior to Update: 1)  Pulmonary Embolism, Hx of  (ICD-V12.51) 2)  Hypertension  (ICD-401.9) 3)  Anemia-iron Deficiency  (ICD-280.9) 4)  Arthralgia  (ICD-719.40) 5)  Preventive Health Care  (ICD-V70.0) 6)  Lump or Mass in Breast  (ICD-611.72) 7)  Abdominal Pain  (ICD-789.00) 8)  Skin Rash   (ICD-782.1) 9)  Headache  (ICD-784.0) 10)  Hx of Palpitations  (ICD-785.1) 11)  Cough  (ICD-786.2) 12)  Menorrhagia  (ICD-626.2) 13)  Fibroids, Uterus  (ICD-218.9) 14)  Obesity Nos  (ICD-278.00) 15)  Bronchitis Nos  (ICD-490)  Current Problems (verified): 1)  Pulmonary Embolism, Hx of  (ICD-V12.51) 2)  Hypertension  (ICD-401.9) 3)  Anemia-iron Deficiency  (ICD-280.9) 4)  Arthralgia  (ICD-719.40) 5)  Preventive Health Care  (ICD-V70.0) 6)  Lump or Mass in Breast  (ICD-611.72) 7)  Abdominal Pain  (ICD-789.00) 8)  Skin Rash  (ICD-782.1) 9)  Headache  (ICD-784.0) 10)  Hx of Palpitations  (ICD-785.1) 11)  Cough  (ICD-786.2) 12)  Menorrhagia  (ICD-626.2) 13)  Fibroids, Uterus  (ICD-218.9) 14)  Obesity Nos  (ICD-278.00) 15)  Bronchitis Nos  (ICD-490)  Medications Prior to Update: 1)  Maxzide-25 37.5-25 Mg Tabs (Triamterene-Hctz) .... Take 1 Tablet By Mouth Once A Day 2)  Ferrous Sulfate 325 (65 Fe) Mg Tabs (Ferrous Sulfate) .... Take 1 Tablet By Mouth Once A Day 3)  Cvs Omeprazole 20 Mg Tbec (Omeprazole) .... Take One Tablet By Mouth Once Daily 4)  Warfarin Sodium 10 Mg Tabs (Warfarin Sodium) .... Tablet Strength: 10mg  Take As Directed 5)  Proventil Hfa 108 (90 Base) Mcg/act  Aers (Albuterol Sulfate) .... 2 Puffs As Required For Breathing Problem. 6)  Mobic 7.5 Mg Tabs (Meloxicam) .Marland KitchenMarland KitchenMarland Kitchen  Take 1 Tablet By Mouth Once A Day, You May Take Up To Two Tab in A Day.  Current Medications (verified): 1)  Maxzide-25 37.5-25 Mg Tabs (Triamterene-Hctz) .... Take 1 Tablet By Mouth Once A Day 2)  Ferrous Sulfate 325 (65 Fe) Mg Tabs (Ferrous Sulfate) .... Take 1 Tablet By Mouth Once A Day 3)  Cvs Omeprazole 20 Mg Tbec (Omeprazole) .... Take One Tablet By Mouth Once Daily 4)  Warfarin Sodium 10 Mg Tabs (Warfarin Sodium) .... Tablet Strength: 10mg  Take As Directed 5)  Proventil Hfa 108 (90 Base) Mcg/act  Aers (Albuterol Sulfate) .... 2 Puffs As Required For Breathing Problem.  Allergies (verified): No  Known Drug Allergies  Past History:  Past Medical History: Last updated: 05/29/2006 Obesity Hypertension H/o multiple episodes of PE, DVTs 2004 Family h/o colon cancer Bronchitis H/o Dizziness, MRI/MRA neg, ENT f/u pen dec 2007 H/o menorrhagia with h/o fibroids H/o iron def anemialast Hb 10.2 3/07 Left partial mastectomy with fibrocystic, phylloids tumour changes S/p colonoscopy 2001 internal hemorrhois, no polyps H/o self limited episode of Afib 2003  Past Surgical History: Last updated: 05/29/2006 Left partial mastectomy with fibrocystic, phylloids tumour changes  Risk Factors: Exercise: no (03/07/2010)  Risk Factors: Smoking Status: never (03/07/2010) Passive Smoke Exposure: no (03/07/2010)  Review of Systems       As per HPI.  Physical Exam  General:  alert, well-developed, well-nourished, and well-hydrated.   Lungs:  normal respiratory effort and normal breath sounds.   Heart:  normal rate and regular rhythm.   Abdomen:  soft and non-tender.   Extremities:  no cyanosis, clubbing or edema; good pulses. Neurologic:  alert & oriented X3, cranial nerves II-XII intact, strength normal in all extremities, and gait normal.     Impression & Recommendations:  Problem # 1:  MENORRHAGIA (ICD-626.2) Patient with hx of fibroid and also recently restarted on coumading ata  higher dose to achieved therapeutic anticoagulation range for PE's (needs long-life therapy, since she has experienced more than 3 episodes so far).  Patient bleeding could be secondary to herfibroids and aggravated by coumadin therapy; also secondary to menopausal changes and endometrial hyperplasia/malignancy. Since she is having mild symptoms of anemia, Will check coagulation panel, CBC with diff and CMET. Will also increased her ferrous sulfate to three times daily and change to every other day her maxzide until acute bleeding is over, to avoid hypotension. Willmake referral to GYN for further evaluation  and to determine need of hysterectomy.  Orders: T-CBC w/Diff ([id number]) T-CMP with Estimated GFR ([id number]) T-Protime, Auto ([id number]) T-PTT ([id number]) Gynecologic Referral (Gyn)  Problem # 2:  HYPERTENSION (ICD-401.9) Well controlled and stable. Willcontinue same regimen,but fornow will ask patient to use BP medications every other day while acute bleeding is present.  Her updated medication list for this problem includes:    Maxzide-25 37.5-25 Mg Tabs (Triamterene-hctz) .Marland Kitchen... Take 1 tablet by mouth once a day  Problem # 3:  ANEMIA-IRON DEFICIENCY (ICD-280.9) Secondary to heavy vaginal bleeding. Will increased ferous sulfate to three times a day and will make referral to GYN in order to address main cause of her anemiaand excessive bleeding.  Her updated medication list for this problem includes:    Ferrous Sulfate 325 (65 Fe) Mg Tabs (Ferrous sulfate) .Marland Kitchen... Take 1 tablet by mouth once a day  Complete Medication List: 1)  Maxzide-25 37.5-25 Mg Tabs (Triamterene-hctz) .... Take 1 tablet by mouth once a day 2)  Ferrous Sulfate 325 (65 Fe) Mg Tabs (Ferrous sulfate) .Marland KitchenMarland KitchenMarland Kitchen  Take 1 tablet by mouth once a day 3)  Cvs Omeprazole 20 Mg Tbec (Omeprazole) .... Take one tablet by mouth once daily 4)  Warfarin Sodium 10 Mg Tabs (Warfarin sodium) .... Tablet strength: 10mg  take as directed 5)  Proventil Hfa 108 (90 Base) Mcg/act Aers (Albuterol sulfate) .... 2 puffs as required for breathing problem.  Patient Instructions: 1)  Please schedule a follow-up appointment in 1-2 month. 2)  Follow with your gynecology appoinment. 3)  Increase your iron pills dose to three times a day. 4)  start taking your BP meds every other day for now. 5)  Keep yourself hydrated. 6)  You will be called with any abnormalities in the tests scheduled or performed today.  If you don't hear from [place] within a week from when the test was performed, you can assume that your test was normal. Process Orders Check  Orders Results:     [facility]: ABN not required for this insurance Tests Sent for requisitioning (March 07, 2010 3:29 PM):     03/07/2010: [facility] -- T-CBC w/Diff [[id number]] (signed)     03/07/2010: [facility] -- T-CMP with Estimated GFR [[id number]] (signed)     03/07/2010: [facility] -- T-Protime, Auto [[id number]] (signed)     03/07/2010: [facility] -- T-PTT [[id number]] (signed)    Prevention & Chronic Care Immunizations   Influenza vaccine: Not documented   Influenza vaccine deferral: Deferred  (01/11/2010)    Tetanus booster: Not documented   Td booster deferral: Deferred  (01/11/2010)    Pneumococcal vaccine: Not documented  Colorectal Screening   Hemoccult: Not documented   Hemoccult action/deferral: Deferred  (01/11/2010)    Colonoscopy: diverticula hemorrhoids  (05/12/2009)   Colonoscopy action/deferral: repeat in 5 years  (05/12/2009)   Colonoscopy due: 05/2014  Other Screening   Pap smear: Not documented    Mammogram: BI-RADS CATEGORY 3:  Probably benign finding(s) - short interval^MM DIGITAL DIAGNOSTIC BILAT  (04/12/2009)   Mammogram due: 09/2008   Smoking status: never  (03/07/2010)  Lipids   Total Cholesterol: 166  (10/11/2008)   LDL: 116  (10/11/2008)   LDL Direct: Not documented   HDL: 37  (10/11/2008)   Triglycerides: 67  (10/11/2008)  Hypertension   Last Blood Pressure: 122 / 77  (03/07/2010)   Serum creatinine: 0.86  (01/11/2010)   Serum potassium 3.6  (01/11/2010)  Self-Management Support :   Personal Goals (by the next clinic visit) :      Personal blood pressure goal: 130/80  (03/07/2010)   Patient will work on the following items until the next clinic visit to reach self-care goals:     Medications and monitoring: take my medicines every day, bring all of my medications to every visit, weigh myself weekly, examine my feet every day  (01/11/2010)      Eating: drink diet soda or water instead of juice or soda, eat more vegetables, use fresh or frozen vegetables, eat foods that are low in salt, eat baked foods instead of fried foods, eat fruit for snacks and desserts, limit or avoid alcohol  (03/07/2010)     Activity: take a 30 minute walk every day  (03/07/2010)    Hypertension self-management support: BP self-monitoring log, Written self-care plan, Education handout, Pre-printed educational material  (01/11/2010)

## 2010-09-19 NOTE — Assessment & Plan Note (Signed)
Summary: COUMADIN/ SB.  Anticoagulant Therapy Managed by: Barbera Setters. Janie Morning  PharmD CACP PCP: Charlaine Dalton Attending: Manning Charity MD Indication 1: Deep vein thrombus Indication 2: Encounter for therapeutic drug monitoring  V58.83 Start date: 07/01/2003 Duration: Indefinite  Patient Assessment Reviewed by: Chancy Milroy PharmD  October 27, 2007 Medication review: verified warfarin dosage & schedule,verified previous prescription medications, verified doses & any changes, verified new medications, reviewed OTC medications, reviewed OTC health products-vitamins supplements etc Complications: none Dietary changes: none   Health status changes: none   Lifestyle changes: none   Recent/future hospitalizations: none   Recent/future procedures: none   Recent/future dental: none Patient Assessment Part 2:  Have you MISSED ANY DOSES or CHANGED TABLETS?  No missed Warfarin doses or changed tablets.  Have you had any BRUISING or BLEEDING ( nose or gum bleeds,blood in urine or stool)?  No reported bruising or bleeding in nose, gums, urine, stool.  Have you STARTED or STOPPED any MEDICATIONS, including OTC meds,herbals or supplements?  No other medications or herbal supplements were started or stopped.  Have you CHANGED your DIET, especially green vegetables,or ALCOHOL intake?  No changes in diet or alcohol intake.  Have you had any ILLNESSES or HOSPITALIZATIONS?  No reported illnesses or hospitalizations  Have you had any signs of CLOTTING?(chest discomfort,dizziness,shortness of breath,arms tingling,slurred speech,swelling or redness in leg)    No chest discomfort, dizziness, shortness of breath, tingling in arm, slurred speech, swelling, or redness in leg.     Treatment  Target INR: 2.0-3.0 INR: 2.0  Date: 10/27/2007 Regimen In:  190.0mg /week INR reflects regimen in: 2.0  New  Tablet strength: : 10mg  Regimen Out:     Sunday: 1 & 1/2 Tablet     Monday: 2 Tablet     Tuesday: 1  & 1/2 Tablet     Wednesday: 2 Tablet     Thursday: 1 & 1/2 Tablet      Friday: 2 Tablet     Saturday: 1 & 1/2 Tablet Total Weekly: 120.0mg /week mg  Next INR Due: 11/10/2007 Adjusted by: Barbera Setters. Alexandria Lodge III PharmD CACP   Return to anticoagulation clinic:  11/10/2007 Time of next visit: 1430   Comments: Patient, on her own volition--had DECREASED her dose from that instructed---to "just 1.5 x 10mg  = 15mg " warfarin by mouth once daily --- citing that the higher doses I had asked her to take were causing her GI irritation (DENIES BRBPR or melanotic stools; no hematemesis, etc.) I have asked her to take this regimen exactly as prescribed and re-evaluate in 2 weeks. I suspect that all along, she has had issues with compliance that makes her true responsiveness to warfarin hard to determine, i.e. WHAT DOSE IS she on?

## 2010-09-19 NOTE — Progress Notes (Signed)
  Phone Note From Other Clinic   Caller: Nurse Call For: [name] PharmD CACP Reason for Call: Schedule Patient Appt, Medication Check, Diagnosis Check Request: Talk with Provider, Call Patient Action Taken: Phone Call Completed, Provider Notified, Patient called Details for Reason: [facility] RN Arline Asp called at home of patient reporting INR = 2.8 on warfarin 10mg  by mouth once daily.  Details of Complaint: No complaints. Wound revealed no acute bleeding but with evidence of continued bleeding (but no current active bleeding). Details of Action Taken: Continue 10mg  warfarin by mouth once daily. INR to be performed on Friday 9-Sep-11 at 1000h. Summary of Call: Telemanagment of warfarin. Initial call taken by: [name] PharmD CACP
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
## 2010-09-19 NOTE — Assessment & Plan Note (Signed)
Summary: coumadin [mkj]  Anticoagulant Therapy Managed by: Barbera Setters. Janie Morning  PharmD CACP PCP: Charlaine Dalton Attending: Ned Grace MD Indication 1: Deep vein thrombus Indication 2: Encounter for therapeutic drug monitoring  V58.83 Start date: 07/01/2003 Duration: Indefinite  Patient Assessment Reviewed by: Chancy Milroy PharmD  March 28, 2009 Medication review: verified warfarin dosage & schedule,verified previous prescription medications, verified doses & any changes, verified new medications, reviewed OTC medications, reviewed OTC health products-vitamins supplements etc Complications: none Dietary changes: none   Health status changes: none   Lifestyle changes: none   Recent/future hospitalizations: none   Recent/future procedures: none   Recent/future dental: none Patient Assessment Part 2:  Have you MISSED ANY DOSES or CHANGED TABLETS?  No missed Warfarin doses or changed tablets.  Have you had any BRUISING or BLEEDING ( nose or gum bleeds,blood in urine or stool)?  No reported bruising or bleeding in nose, gums, urine, stool.  Have you STARTED or STOPPED any MEDICATIONS, including OTC meds,herbals or supplements?  No other medications or herbal supplements were started or stopped.  Have you CHANGED your DIET, especially green vegetables,or ALCOHOL intake?  No changes in diet or alcohol intake.  Have you had any ILLNESSES or HOSPITALIZATIONS?  No reported illnesses or hospitalizations  Have you had any signs of CLOTTING?(chest discomfort,dizziness,shortness of breath,arms tingling,slurred speech,swelling or redness in leg)    No chest discomfort, dizziness, shortness of breath, tingling in arm, slurred speech, swelling, or redness in leg.     Treatment  Target INR: 2.0-3.0 INR: 1.0  Date: 03/28/2009 Regimen In:  145.0mg /week INR reflects regimen in: 1.0   Next INR Due: 05/02/2009 Adjusted by: Barbera Setters. Alexandria Lodge III PharmD CACP   Return to anticoagulation  clinic:  05/02/2009 Time of next visit: 1500   Comments: Patient states she has only been taking "10"mg warfarin. She is always provided with written and 2-D picture-gram(s) of her dosing regimen from visit to visit. She is non-compliant with her instructions even today. She is citing that she has been on warfarin for 6 years and is ready to come off--citing that even when she is sub-therapeutic--nothing happens--insofar as VTE. Discussed with Dr. Phifer/Attending Physician today. She indicates best course of action would to have patient seen in [facility] by first available physician--for the purpose of having a conversation of FULL DISCLOSESURE/RISKS vs. BENEFITS of discontinuing or remaining on warfarin.   Allergies: No Known Drug Allergies

## 2010-09-19 NOTE — Progress Notes (Signed)
Summary: med refill/gp  Phone Note Refill Request Message from:  Fax from Pharmacy on February 17, 2008 8:39 AM  Refills Requested: Medication #1:  MAXZIDE-25 37.5-25 MG TABS Take 1 tablet by mouth once a day   Last Refilled: 01/27/2008  Method Requested: electronic Initial call taken by: Chinita Pester RN,  February 17, 2008 8:40 AM  Follow-up for Phone Call        Filled electronically.          Appended Document: med refill/gp    Prescriptions: MAXZIDE-25 37.5-25 MG TABS (TRIAMTERENE-HCTZ) Take 1 tablet by mouth once a day  #31 x 3   Entered and Authorized by:   Zara Council MD   Signed by:   Zara Council MD on 02/17/2008   Method used:   Electronically sent to ...       [facility] [phone]*       [address] [place], [place]  [address]       Ph: [phone] or [phone]       Fax: [phone]   RxID:   [id number]

## 2010-09-19 NOTE — Progress Notes (Signed)
  Phone Note Outgoing Call   Summary of Call: Called number and the message was for a Renee so I didn;t leave a message. I am concerned about coumadin - indicated for PE per notes. Last INR indicates she was not taking or was non comlianet. Last note Dr Gwenlyn Perking seemed to indicate she was to hold for surgery and use bridging lovenox. Sugery was to be the 26th I think and Dr Gwenlyn Perking cleary indicated pt was to F/U with Dr Alexandria Lodge after sugery but I see no appt s with Dr Alexandria Lodge. Are we able to we additional info before I refill this?  Follow-up for Phone Call        Called pt and got her. Just got out of surgery. Was on Lovenox - last dose yesterday. Coumadin 15 mg once daily. Has Dr Alexandria Lodge appt Tues 6th. Will fill Follow-up by: Blanch Media MD,  April 21, 2010 5:01 PM    New/Updated Medications: WARFARIN SODIUM 10 MG TABS (WARFARIN SODIUM) Tablet Strength: 10mg . Please provide a scored tablet. Take one and one half pill by mouth once daily until you see Dr Alexandria Lodge on 04/25/10 Prescriptions: WARFARIN SODIUM 10 MG TABS (WARFARIN SODIUM) Tablet Strength: 10mg . Please provide a scored tablet. Take one and one half pill by mouth once daily until you see Dr Alexandria Lodge on 04/25/10  #45 x 0   Entered and Authorized by:   Blanch Media MD   Signed by:   Blanch Media MD on 04/21/2010   Method used:   Electronically to        [facility] [phone]* (retail)       [address] [place], [place]  [address]       Ph: [phone] or [phone]       Fax: [phone]   RxID:   [id number]

## 2010-09-19 NOTE — Assessment & Plan Note (Signed)
Summary: FU VISIT/DS   Vital Signs:  Patient profile:   50 year old female Height:      71 inches (180.34 cm) Weight:      246.3 pounds (111.95 kg) BMI:     34.48 Temp:     97.2 degrees F (36.22 degrees C) oral Pulse rate:   89 / minute BP sitting:   131 / 92  (right arm)  Vitals Entered By: Stanton Kidney Ditzler RN (April 05, 2009 10:37 AM) Is Patient Diabetic? No Pain Assessment Patient in pain? no      Nutritional Status BMI of > 30 = obese Nutritional Status Detail appetite good  Have you ever been in a relationship where you felt threatened, hurt or afraid?denies   Does patient need assistance? Functional Status Self care Ambulation Normal Comments Discuss Coumadin. Cramps past 3-4 weeks legs, toes, hands and arms. 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 In here today to discuss if she can stop her coumadin. She says she has been on the coumadin for a long time now. She is worried about the bruises on her legs and is also having muscle cramps and stiffness of hands, with occasional swelling. She is also concerned that she is not able to eat as she wishes because of the possible interaction with coumadin. She has had subtherapeutic INR many times without any bad results. So she wants [place] to consider stopping her coumadin. She wants to try aspirin instead.   Depression History:      The patient denies a depressed mood most of the day and a diminished interest in her usual daily activities.         Preventive Screening-Counseling & Management  Alcohol-Tobacco     Smoking Status: never     Year Quit: years     Passive Smoke Exposure: no  Caffeine-Diet-Exercise     Does Patient Exercise: no  Medications Prior to Update: 1)  Maxzide-25 37.5-25 Mg Tabs (Triamterene-Hctz) .... Take 1 Tablet By Mouth Once A Day 2)  Ferrous Sulfate 325 (65 Fe) Mg Tabs  (Ferrous Sulfate) .... Take 1 Tablet By Mouth Once A Day 3)  [facility] Omeprazole 20 Mg Tbec (Omeprazole) .... Take One Tablet By Mouth Once Daily 4)  Warfarin Sodium 10 Mg Tabs (Warfarin Sodium) .... Tablet Strength: 10mg  Take As Directed 5)  Proventil Hfa 108 (90 Base) Mcg/act  Aers (Albuterol Sulfate) .... 2 Puffs As Required For Breathing Problem. 6)  Lovenox 120 Mg/0.77ml Soln (Enoxaparin Sodium) .... Inject 115mg  Subcutaneously Twice Daily  Allergies: No Known Drug Allergies  Past History:  Past Medical History: Last updated: 05/29/2006 Obesity Hypertension H/o multiple episodes of PE, DVTs 2004 Family h/o colon cancer Bronchitis H/o Dizziness, MRI/MRA neg, ENT f/u pen dec 2007 H/o menorrhagia with h/o fibroids H/o iron def anemialast Hb 10.2 3/07 Left partial mastectomy with fibrocystic, phylloids tumour changes S/p colonoscopy 2001 internal hemorrhois, no polyps H/o self limited episode of Afib 2003  Past Surgical History: Last updated: 05/29/2006 Left partial mastectomy with fibrocystic, phylloids tumour changes  Risk Factors: Exercise: no (04/05/2009)  Risk Factors: Smoking Status: never (04/05/2009) Passive Smoke Exposure: no (04/05/2009)  Review of Systems       Patient denies fever, weight loss, fatigue, weakness/numbness, headache, NVD, abdominal pain, hematemesis/melena, chest pain, SOB, palpitations, cough, rashes.    Physical Exam  General:  alert and well-developed.   Head:  normocephalic and atraumatic.   Lungs:  normal respiratory effort and normal breath sounds.   Heart:  normal rate and regular rhythm.   Abdomen:  soft and non-tender.   Msk:  no evidence of joint or spine tenderness/ swelling.  Pulses:  normal peripheral pulses Extremities:  no cyanosis, clubbing or edema  Neurologic:  alert & oriented X3.  non focal.  Psych:  normally interactive.     Impression & Recommendations:  Problem # 1:  PULMONARY EMBOLISM, HX OF (ICD-V12.51) Discussed  pros and cons of stopping warfarin. Explained to her the need to continue long-term treatment given multiple episodes of clots in the past. She is willing to continue with coumadin for the time being and discuss more on follow up visit.   Her updated medication list for this problem includes:    Warfarin Sodium 10 Mg Tabs (Warfarin sodium) .Marland Kitchen... Tablet strength: 10mg  take as directed    Lovenox 120 Mg/0.11ml Soln (Enoxaparin sodium) ..... Inject 115mg  subcutaneously twice daily  Problem # 2:  HYPERTENSION (ICD-401.9) Borderling high BP noted. Patient says she feels stressed. Will check b-met. No change in regimen today.   Her updated medication list for this problem includes:    Maxzide-25 37.5-25 Mg Tabs (Triamterene-hctz) .Marland Kitchen... Take 1 tablet by mouth once a day  Problem # 3:  ARTHRALGIA (ICD-719.40) No evidence of active inflammation or swelling. Will check screening blood work to look for connective tissue disorder. Will treat symptomatically for now.   Orders: T-Comprehensive Metabolic Panel [phone]) T-RA (Rheumatoid Factor) [phone]) Jackie Plum ([id number])  Problem # 4:  PREVENTIVE HEALTH CARE (ICD-V70.0) Will order colonoscopy and mammogram. She will need a pap smear on next visit.  Orders: Mammogram (Screening) (Mammo) Gastroenterology Referral (GI)  Complete Medication List: 1)  Maxzide-25 37.5-25 Mg Tabs (Triamterene-hctz) .... Take 1 tablet by mouth once a day 2)  Ferrous Sulfate 325 (65 Fe) Mg Tabs (Ferrous sulfate) .... Take 1 tablet by mouth once a day 3)  [facility]vs Omeprazole 20 Mg Tbec (Omeprazole) .... Take one tablet by mouth once daily 4)  Warfarin Sodium 10 Mg Tabs (Warfarin sodium) .... Tablet strength: 10mg  take as directed 5)  Proventil Hfa 108 (90 Base) Mcg/act Aers (Albuterol sulfate) .... 2 puffs as required for breathing problem. 6)  Lovenox 120 Mg/0.12ml Soln (Enoxaparin sodium) .... Inject 115mg  subcutaneously twice daily  Patient Instructions: 1)  Please  schedule a follow-up appointment in 1 month. 2)  Please keep up the appointment with Dr. Alexandria Lodge.  Prescriptions: WARFARIN SODIUM 10 MG TABS (WARFARIN SODIUM) Tablet Strength: 10mg  Take as directed  #90 x 3   Entered and Authorized by:   Zara Council MD   Signed by:   Zara Council MD on 04/05/2009   Method used:   Electronically to        [facility] [phone]* (retail)       [address] [place], [place]  [address]       Ph: [phone] or [phone]       Fax: [phone]   RxID:   [id number]

## 2010-09-19 NOTE — Progress Notes (Signed)
Summary: Telemanagement of warfarin  Phone Note From Other Clinic   Caller: Nurse Call For: [name] PharmD CACP Reason for Call: Schedule Patient Appt, Medication Check, Diagnosis Check Request: Talk with Provider, Call Patient Action Taken: Phone Call Completed, Provider Notified, Patient called Details for Reason: [facility] RN Arline Asp calls from the home of the patient reporting INR today = 2.4 Details of Complaint: Wound dry, minimal bleeding. No other complaints--no other sites/sources of bleeding. Details of Action Taken: Patient instructed to continue taking 2x5mg  = 10mg  warfarin by mouth once daily until seen in [facility] on Monday 12-Sep-11. Summary of Call: Telemanagement of warfarin. Initial call taken by: [name] PharmD CACP

## 2010-09-19 NOTE — Letter (Signed)
Summary: Consultant Letter  All     ,     Phone:   Fax:     03/29/2010  Dear: Dr. Clearance Coots  I am writing to report my evaluation of [name], a 50 Years Old Female on whom I consulted 03/17/2010 at . When I saw the patient the Chief Complaint was "REQUEST  GYN REFERRAL  FOR HEAVY CYCLE ".  At that time the history of the present illness was as follows: 51 y/o female with pmh significant for multiple episodes of PE and Fibroids, who require long-term anticoagulation therapy and until now unable to be compliant with it due to heavy menstrual cycle and anemia.  She is currently otherwise stable and pretty healthy and is clear for from our medical point of view for surgical intervention; would be safe to stop coumadin prior to surgery , but she will need lovenox after surgical proccedureis over with overlapping coumadin therapy to reach therapeutic INR after surgery is over.  Will arrange visits with Dr. Alexandria Lodge at the coumadin clinic once surgery is done for further adjustments to her coumadin and to determine length  of lovenox therapy.  Patient is compliant with rest of her medications and denies any CP,SOB, HA, nausea, vomiting, melena, hematochezia, blurred vision or any other complaints.  The medication list includes: MAXZIDE-25 37.5-25 MG TABS (TRIAMTERENE-HCTZ) Take 1 tablet by mouth once a day FERROUS SULFATE 325 (65 FE) MG TABS (FERROUS SULFATE) Take 1 tablet by mouth once a day [facility] OMEPRAZOLE 20 MG TBEC (OMEPRAZOLE) Take one tablet by mouth once daily WARFARIN SODIUM 10 MG TABS (WARFARIN SODIUM) Tablet Strength: 10mg  Take as directed PROVENTIL HFA 108 (90 BASE) MCG/ACT  AERS (ALBUTEROL SULFATE) 2 puffs as required for breathing problem.    Yours truly,   Vassie Loll MD

## 2010-09-19 NOTE — Progress Notes (Signed)
Summary: rash, call for appt/ hla  Phone Note Call from Patient   Caller: Patient Reason for Call: Acute Illness Summary of Call: pt called c/o rash across chest and itching x 3days, appt made for 9/4. asking about coumadin refill also. Initial call taken by: Marin Roberts RN,  April 23, 2007 1:45 PM

## 2010-09-19 NOTE — Progress Notes (Signed)
Summary: refill/ hla  Phone Note Refill Request Message from:  Patient on June 27, 2007 4:26 PM  Refills Requested: Medication #1:  COUMADIN 10 MG TAB Take as directed. Initial call taken by: Marin Roberts RN,  June 27, 2007 4:27 PM  Follow-up for Phone Call        Refill approved-nurse to complete Follow-up by: Ulyess Mort MD,  June 27, 2007 5:02 PM      Prescriptions: COUMADIN 10 MG TAB (WARFARIN SODIUM) Take as directed.  #60 x 1   Entered and Authorized by:   Ulyess Mort MD   Signed by:   Ulyess Mort MD on 06/27/2007   Method used:   Electronically sent to ...       [facility] [phone]*       [address] [place], [place]  [address]       Ph: [phone] or [phone]       Fax: [phone]   RxID:   [id number]

## 2010-09-19 NOTE — Progress Notes (Signed)
Summary: refill/gg  Phone Note Refill Request  on July 22, 2008 3:35 PM  Refills Requested: Medication #1:  MAXZIDE-25 37.5-25 MG TABS Take 1 tablet by mouth once a day  Method Requested: Electronic Initial call taken by: Merrie Roof RN,  July 22, 2008 3:35 PM  Follow-up for Phone Call        Refill approved-nurse to complete  Additional Follow-up for Phone Call Additional follow up Details #1::        Rx called to pharmacy Additional Follow-up by: Merrie Roof RN,  July 23, 2008 11:34 AM      Prescriptions: MAXZIDE-25 37.5-25 MG TABS (TRIAMTERENE-HCTZ) Take 1 tablet by mouth once a day  #30 Tablet x 2   Entered and Authorized by:   Zara Council MD   Signed by:   Zara Council MD on 07/22/2008   Method used:   Telephoned to ...       [facility] [phone]* (retail)       [address] [place], [place]  [address]       Ph: [phone] or [phone]       Fax: [phone]   RxID:   [id number]

## 2010-09-19 NOTE — Assessment & Plan Note (Signed)
Summary: CHECKUP/SB.   Vital Signs:  Patient profile:   50 year old female Height:      71 inches (180.34 cm) Weight:      256.06 pounds (116.39 kg) BMI:     35.84 Temp:     97.5 degrees F (36.39 degrees C) oral Pulse rate:   69 / minute BP sitting:   133 / 84  (left arm) Cuff size:   large  Vitals Entered By: Angelina Ok RN (Jan 11, 2010 2:51 PM) CC: Depression Is Patient Diabetic? No Pain Assessment Patient in pain? no      Nutritional Status BMI of > 30 = obese  Have you ever been in a relationship where you felt threatened, hurt or afraid?No   Does patient need assistance? Functional Status Self care Ambulation Normal Comments Left shoulder and knee pain. Problem sleeping. Need refill on Coumadin.   Primary Care Provider:  Zara Council MD  CC:  Depression.  History of Present Illness: [name] is a 50 year old Female with PMH/problems as outlined in the EMR, who presents to the [facility] with chief complaint(s) of:     1. Pain in the shoulder and knee. It's been going on for the past month or so. Has had similar problem off and on in the past that seemed to respond to oral meds. . No fevers. Off and on. Left shoulder, left knee hurting and no pain on any other joints, it feels only left side is bothering her. Able to walk okay. Occasional pain in the neck. No family history of arthritis. Denies morning stiffness. Hurts regardless of activities.    2. Needs refills on her coumadin. She has been off her meds for sometime wants refill. She has had trouble taking the meds on regular basis as prescribed. Has not seen Dr. Alexandria Lodge for a long time. She is willing to continue taking coumadin.   Depression History:      The patient denies a depressed mood most of the day and a diminished interest in her usual daily activities.         Preventive Screening-Counseling & Management  Alcohol-Tobacco     Smoking Status: never     Year Quit: years     Passive Smoke Exposure:  no  Current Medications (verified): 1)  Maxzide-25 37.5-25 Mg Tabs (Triamterene-Hctz) .... Take 1 Tablet By Mouth Once A Day 2)  Ferrous Sulfate 325 (65 Fe) Mg Tabs (Ferrous Sulfate) .... Take 1 Tablet By Mouth Once A Day 3)  Cvs Omeprazole 20 Mg Tbec (Omeprazole) .... Take One Tablet By Mouth Once Daily 4)  Warfarin Sodium 10 Mg Tabs (Warfarin Sodium) .... Tablet Strength: 10mg  Take As Directed 5)  Proventil Hfa 108 (90 Base) Mcg/act  Aers (Albuterol Sulfate) .... 2 Puffs As Required For Breathing Problem. 6)  Ketorolac Tromethamine 10 Mg Tabs (Ketorolac Tromethamine) .... Please Take One Pill As Needed For Pain, Up To Four Times A Day  Allergies (verified): No Known Drug Allergies  Past History:  Past Medical History: Last updated: 05/29/2006 Obesity Hypertension H/o multiple episodes of PE, DVTs 2004 Family h/o colon cancer Bronchitis H/o Dizziness, MRI/MRA neg, ENT f/u pen dec 2007 H/o menorrhagia with h/o fibroids H/o iron def anemialast Hb 10.2 3/07 Left partial mastectomy with fibrocystic, phylloids tumour changes S/p colonoscopy 2001 internal hemorrhois, no polyps H/o self limited episode of Afib 2003  Past Surgical History: Last updated: 05/29/2006 Left partial mastectomy with fibrocystic, phylloids tumour changes  Risk Factors: Exercise: no (  04/05/2009)  Risk Factors: Smoking Status: never (01/11/2010) Passive Smoke Exposure: no (01/11/2010)  Review of Systems       as per HPI  Physical Exam  General:  alert and well-developed.   Head:  normocephalic and atraumatic.   Eyes:  vision grossly intact.   Mouth:  pharynx pink and moist.   Neck:  supple.   Lungs:  normal respiratory effort and normal breath sounds.   Heart:  normal rate and regular rhythm.   Abdomen:  soft and non-tender.   Msk:  Mildly tender left shoulder and left knee, normal range of motion. Able to walk normally. No other joint problems.  Pulses:  normal peripheral pulses  Extremities:   no cyanosis, clubbing or edema  Neurologic:  non focal.  Psych:  normally interactive.     Impression & Recommendations:  Problem # 1:  ARTHRALGIA (ICD-719.40) Mild tenderness noted. She has had similar problem in the past; few months ago had a weakly positive RA and negative ANA. I will repeat the tests today and treat her symptomatically. One thing of concern is that her problem is unilateral that makes me wonder if it is something spinal with referred pain, but her troubles are located only in the joints. Won't image C-spine for now.   Problem # 2:  PULMONARY EMBOLISM, HX OF (ICD-V12.51) She has taker herself off coumadin a few times in the past. She needs to come back on her regular dose. I will refill her coumadin and get a repeat INR next week.   The following medications were removed from the medication list:    Lovenox 120 Mg/0.35ml Soln (Enoxaparin sodium) ..... Inject 115mg  subcutaneously twice daily Her updated medication list for this problem includes:    Warfarin Sodium 10 Mg Tabs (Warfarin sodium) .Marland Kitchen... Tablet strength: 10mg  take as directed  Orders: T-Protime (in-house) ([id number])  Problem # 3:  HYPERTENSION (ICD-401.9) Well-controlled. Continue the current regimen.   Her updated medication list for this problem includes:    Maxzide-25 37.5-25 Mg Tabs (Triamterene-hctz) .Marland Kitchen... Take 1 tablet by mouth once a day  Complete Medication List: 1)  Maxzide-25 37.5-25 Mg Tabs (Triamterene-hctz) .... Take 1 tablet by mouth once a day 2)  Ferrous Sulfate 325 (65 Fe) Mg Tabs (Ferrous sulfate) .... Take 1 tablet by mouth once a day 3)  Cvs Omeprazole 20 Mg Tbec (Omeprazole) .... Take one tablet by mouth once daily 4)  Warfarin Sodium 10 Mg Tabs (Warfarin sodium) .... Tablet strength: 10mg  take as directed 5)  Proventil Hfa 108 (90 Base) Mcg/act Aers (Albuterol sulfate) .... 2 puffs as required for breathing problem. 6)  Mobic 7.5 Mg Tabs (Meloxicam) .... Take 1 tablet by mouth once a  day, you may take up to two tab in a day.  Other Orders: T-Comprehensive Metabolic Panel [phone]) T-Sed Rate (Automated) [id number]) T-Rheumatoid Factor [phone]) T-Antinuclear Antib (ANA) [phone])  Patient Instructions: 1)  Please schedule a follow-up appointment in 2 weeks. 2)  We will let you know if anything wrong with your lab work.   Prescriptions: WARFARIN SODIUM 10 MG TABS (WARFARIN SODIUM) Tablet Strength: 10mg  Take as directed  #90 x 3   Entered and Authorized by:   Zara Council MD   Signed by:   Zara Council MD on 01/11/2010   Method used:   Print then Give to Patient   RxID:   [id number] MOBIC 7.5 MG TABS (MELOXICAM) Take 1 tablet by mouth once a day, you may take up to two tab  in a day.  #15 x 1   Entered and Authorized by:   Zara Council MD   Signed by:   Zara Council MD on 01/11/2010   Method used:   Print then Give to Patient   RxID:   [id number]   Prevention & Chronic Care Immunizations   Influenza vaccine: Not documented   Influenza vaccine deferral: Deferred  (01/11/2010)    Tetanus booster: Not documented   Td booster deferral: Deferred  (01/11/2010)    Pneumococcal vaccine: Not documented  Colorectal Screening   Hemoccult: Not documented   Hemoccult action/deferral: Deferred  (01/11/2010)    Colonoscopy: diverticula hemorrhoids  (05/12/2009)   Colonoscopy action/deferral: repeat in 5 years  (05/12/2009)   Colonoscopy due: 05/2014  Other Screening   Pap smear: Not documented    Mammogram: BI-RADS CATEGORY 3:  Probably benign finding(s) - short interval^MM DIGITAL DIAGNOSTIC BILAT  (04/12/2009)   Mammogram due: 09/2008   Smoking status: never  (01/11/2010)  Lipids   Total Cholesterol: 166  (10/11/2008)   LDL: 116  (10/11/2008)   LDL Direct: Not documented   HDL: 37  (10/11/2008)   Triglycerides: 67  (10/11/2008)  Hypertension   Last Blood Pressure: 133 / 84  (01/11/2010)   Serum creatinine: 1.01   (04/05/2009)   Serum potassium 3.7  (04/05/2009) CMP ordered     Hypertension flowsheet reviewed?: Yes   Progress toward BP goal: Unchanged  Self-Management Support :    Patient will work on the following items until the next clinic visit to reach self-care goals:     Medications and monitoring: take my medicines every day, bring all of my medications to every visit, weigh myself weekly, examine my feet every day  (01/11/2010)     Eating: drink diet soda or water instead of juice or soda, eat more vegetables, use fresh or frozen vegetables, eat foods that are low in salt, eat baked foods instead of fried foods, eat fruit for snacks and desserts, limit or avoid alcohol  (01/11/2010)     Activity: take a 30 minute walk every day, take the stairs instead of the elevator, park at the far end of the parking lot  (01/11/2010)    Hypertension self-management support: BP self-monitoring log, Written self-care plan, Education handout, Pre-printed educational material  (01/11/2010)   Hypertension self-care plan printed.   Hypertension education handout printed    Vital Signs:  Patient profile:   50 year old female Height:      71 inches (180.34 cm) Weight:      256.06 pounds (116.39 kg) BMI:     35.84 Temp:     97.5 degrees F (36.39 degrees C) oral Pulse rate:   69 / minute BP sitting:   133 / 84  (left arm) Cuff size:   large  Vitals Entered By: Angelina Ok RN (Jan 11, 2010 2:51 PM)   Process Orders Check Orders Results:     [facility]: ABN not required for this insurance Tests Sent for requisitioning (Jan 12, 2010 8:32 AM):     01/11/2010: [facility] -- T-Comprehensive Metabolic Panel [[id number]] (signed)     01/11/2010: [facility] -- T-Sed Rate (Automated) [phone] (signed)     01/11/2010: [facility] -- T-Rheumatoid Factor [id number] (signed)     01/11/2010: [facility] -- T-Antinuclear  Antib (ANA) [[id number]] (signed)   Last LDL:  116 (10/11/2008 2:45:07 PM)      Laboratory Results   Blood Tests   Date/Time Received: Jan 11, 2010 4:22 PM Date/Time Reported: Alric Quan  Jan 11, 2010 4:22 PM    INR: 1.4   (Normal Range: 0.88-1.12   Therap INR: 2.0-3.5)

## 2010-09-19 NOTE — Assessment & Plan Note (Signed)
Summary: ACUTE-HEART PALPATATIONS PER PT(YERRABAPU)/CFB   Vital Signs:  Patient Profile:   50 Years Old Female Height:     71 inches (180.34 cm) Weight:      279.5 pounds (127.05 kg) BMI:     39.12 Temp:     98.7 degrees F (37.06 degrees C) oral Pulse rate:   81 / minute BP sitting:   136 / 88  (right arm) Cuff size:   large  Vitals Entered By: Theotis Barrio (February 13, 2007 3:12 PM)             Is Patient Diabetic? No Nutritional Status normal  Does patient need assistance? Functional Status Self care Ambulation Normal   PCP:  Shannan Harper  Chief Complaint:  sob/ palpatation for about two weeks.  History of Present Illness: Pt is a 50yr old lady with h/o HTN, obesity, unprovoked  DVT/PE in 2004, prior h/o Afib in 2003, spontaneously stopped Coumadin 1 yr ago. She presents today with h/o 2wks of heart palpitations on and off ass with SOB, occ CP, diaphoresis and nausea. She has also felt light headed occ. She is anxious and seems to her when she was afib. Also the fact that she stopped the Coumadin on her own  predisposes her for blood clots and that scares her. She has not had recent heavy periods, infections, stress. Her cardiac risk factors are HTN, Obesity, unknown lipid status, family history of CAD.    Current Allergies: No known allergies     Risk Factors:  Tobacco use:  quit    Year quit:  years Passive smoke exposure:  no Drug use:  no HIV high-risk behavior:  no Alcohol use:  no Exercise:  no Seatbelt use:  100 %  Family History Risk Factors:    Family History of MI in females < 33 years old:  no    Family History of MI in males < 53 years old:  no    Physical Exam  General:     alert, well-developed, overweight-appearing, and diaphoretic.   Head:     normocephalic.   Eyes:     vision grossly intact, pupils equal, pupils round, and pupils reactive to light.   Mouth:     pharynx pink and moist and fair dentition.   Neck:     supple, no  thyromegaly, and no JVD.   Lungs:     normal respiratory effort, no intercostal retractions, no accessory muscle use, normal breath sounds, no crackles, and no wheezes.  O2 sats on RA 97-100% Heart:     normal rate, regular rhythm, no murmur, no gallop, and no rub.   Abdomen:     soft, non-tender, and normal bowel sounds.   Extremities:     Trace edema, Homan's neg, no discrepancy in leg diameter. Neurologic:     Non focal    Impression & Recommendations:  Problem # 1:  PULMONARY EMBOLISM, HX OF (ICD-V12.51) From history and presentation it appears that this could very well be another episode of PE, especially with her past history of several episodes of unprovoked PE. Her sats are stable for now but will go ahead and admit her on tele, put her on therapeutic dose of Lovenox till PE ruled out. MEanwhile will go ahead and check a CBC, TSH, CMET, Cardiac enzymes, D-Dimer and CT angio chest stat. She has multiple cardiac risk factors to r/o angina/ MI too. A 2 D Echoardiogram will be done to r/o any motion abn, and also r/o  Pul HTN with her h/o HTN and ? OSA. Pt will benefit from outpt sleep study in the long run.       Medication Administration  Infusion # 1:    Comments: 22 G IVSL inserted into left hand, blood returned, flushed well. It is noted that the catheter was not inserted completely to the hub d/t a bend in the vein. Sterile dressing applied. Pt tolerated well. ..................................................................Marland KitchenHenderson Cloud  February 13, 2007 5:12 PM   Orders Added: 1)  Est. Patient Level IV [81191]  Report called to 3700. Accepting nurse is Victorino Dike. Pt transported to 3705-1 via wheelchair...................................................................Marland KitchenHenderson Cloud  February 13, 2007 6:07 PM

## 2010-09-19 NOTE — Assessment & Plan Note (Signed)
Summary: COU/VS  Anticoagulant Therapy Managed by: Barbera Setters. Janie Morning  PharmD CACP PCP: Charlaine Dalton Attending: Eliseo Gum MD Indication 1: Deep vein thrombus Indication 2: Encounter for therapeutic drug monitoring  V58.83 Start date: 07/01/2003 Duration: Indefinite  Patient Assessment Reviewed by: Chancy Milroy PharmD  September 29, 2007 Medication review: verified warfarin dosage & schedule,verified previous prescription medications, verified doses & any changes, verified new medications, reviewed OTC medications, reviewed OTC health products-vitamins supplements etc Complications: none Dietary changes: none   Health status changes: none   Lifestyle changes: none   Recent/future hospitalizations: none   Recent/future procedures: none   Recent/future dental: none Patient Assessment Part 2:  Have you MISSED ANY DOSES or CHANGED TABLETS?  No missed Warfarin doses or changed tablets.  Have you had any BRUISING or BLEEDING ( nose or gum bleeds,blood in urine or stool)?  No reported bruising or bleeding in nose, gums, urine, stool.  Have you STARTED or STOPPED any MEDICATIONS, including OTC meds,herbals or supplements?  No other medications or herbal supplements were started or stopped.  Have you CHANGED your DIET, especially green vegetables,or ALCOHOL intake?  No changes in diet or alcohol intake.  Have you had any ILLNESSES or HOSPITALIZATIONS?  No reported illnesses or hospitalizations  Have you had any signs of CLOTTING?(chest discomfort,dizziness,shortness of breath,arms tingling,slurred speech,swelling or redness in leg)    No chest discomfort, dizziness, shortness of breath, tingling in arm, slurred speech, swelling, or redness in leg.     Treatment  Target INR: 2.0-3.0 INR: 1.7  Date: 09/29/2007 Regimen In:  175.0mg /week INR reflects regimen in: 1.7  New  Tablet strength: : 10mg  Regimen Out:     Monday: 3 Tablet     Wednesday: 3 Tablet       Friday: 3  Tablet  Total Weekly: 190.0mg /week mg  Next INR Due: 10/13/2007 Adjusted by: Barbera Setters. Groce III PharmD CACP   Return to anticoagulation clinic:  10/13/2007 Time of next visit: 1500   Comments: Increase to 3x10mg  tablets warfarin on MWF; 2.5x10mg  tablets warfarin on S/Tu/Th/Sa = 190mg  total per week

## 2010-09-19 NOTE — Assessment & Plan Note (Signed)
Summary: 261/cfb  Anticoagulant Therapy Managed by: Barbera Setters. [name]  PharmD CACP PCP: Shannan Harper Indication 1: Deep vein thrombus Indication 2: Encounter for therapeutic drug monitoring  V58.83 Start date: 07/01/2003 Duration: Indefinite  Patient Assessment Reviewed by: Chancy Milroy PharmD  June 30, 2007 Medication review: verified warfarin dosage & schedule,verified previous prescription medications, verified doses & any changes, verified new medications, reviewed OTC medications, reviewed OTC health products-vitamins supplements etc Complications: none Dietary changes: none   Health status changes: none   Lifestyle changes: none   Recent/future hospitalizations: none   Recent/future procedures: none   Recent/future dental: none Patient Assessment Part 2:  Have you MISSED ANY DOSES or CHANGED TABLETS?  No missed Warfarin doses or changed tablets.  Have you had any BRUISING or BLEEDING ( nose or gum bleeds,blood in urine or stool)?  No reported bruising or bleeding in nose, gums, urine, stool.  Have you STARTED or STOPPED any MEDICATIONS, including OTC meds,herbals or supplements?  No other medications or herbal supplements were started or stopped.  Have you CHANGED your DIET, especially green vegetables,or ALCOHOL intake?  No changes in diet or alcohol intake.  Have you had any ILLNESSES or HOSPITALIZATIONS?  No reported illnesses or hospitalizations  Have you had any signs of CLOTTING?(chest discomfort,dizziness,shortness of breath,arms tingling,slurred speech,swelling or redness in leg)    No chest discomfort, dizziness, shortness of breath, tingling in arm, slurred speech, swelling, or redness in leg.     Treatment  Target INR: 2.0-3.0 INR: 2.1  Date: 06/30/2007 Regimen In:  155.0mg /week INR reflects regimen in: 2.1  New  Tablet strength: : 10mg  Regimen Out:     Sunday: 2 Tablet     Monday: 2 & 1/2 Tablet     Tuesday: 2 Tablet     Wednesday: 2 & 1/2  Tablet     Thursday: 2 Tablet      Friday: 2 & 1/2 Tablet     Saturday: 2 Tablet Total Weekly: 155.0mg /week mg  Next INR Due: 07/21/2007 Adjusted by: Barbera Setters. Alexandria Lodge III PharmD CACP   Return to anticoagulation clinic:  07/21/2007 Time of next visit: 1615

## 2010-09-19 NOTE — Assessment & Plan Note (Signed)
Summary: EST-REGULAR CHECK UP/CFB   Vital Signs:  Patient Profile:   50 Years Old Female Height:     71 inches (180.34 cm) Weight:      278.4 pounds (126.55 kg) BMI:     38.97 Temp:     97.1 degrees F (36.17 degrees C) oral Pulse rate:   73 / minute BP sitting:   126 / 81  (right arm)  Pt. in pain?   no  Vitals Entered By: Krystal Eaton Duncan Dull) (March 08, 2008 2:30 PM)              Is Patient Diabetic? No Nutritional Status BMI of > 30 = obese  Have you ever been in a relationship where you felt threatened, hurt or afraid?Unable to ask   Does patient need assistance? Functional Status Self care Ambulation Normal     PCP:  Shannan Harper  Chief Complaint:  HFU.  History of Present Illness: Ms. [name] comes to the clinic today for routine follow-up. She is accompanied by her friend. 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 Also recently found to have a right breast lesion during a scan done for her pulm embolism. She is following up with [facility] hospital for that and is scheduled to have a stress test to get clearance for the operative procedure she is going to undergo at the [facility] hospital.  Recent hospital admission for chest pain. Denies any active presenting complaint. She wants to do something about her weight, wants [place] to consider drug treatment. Had been on Sibutramine before, she is not sure whether that helped her any. Wants [place] to prescribe inhalation treatment for her bronchitis.       Prior Medication List:  MAXZIDE-25 37.5-25 MG TABS (TRIAMTERENE-HCTZ) Take 1 tablet by mouth once a day FERROUS SULFATE 325 (65 FE) MG TABS (FERROUS SULFATE) Take 1 tablet by mouth once a day COUMADIN 10 MG TAB (WARFARIN SODIUM) Take as directed. COUMADIN 5 MG TAB (WARFARIN SODIUM) Take as directed. OMEPRAZOLE 40 MG  CPDR (OMEPRAZOLE) Take 1 capsule by mouth two times a day   Current Allergies (reviewed today): No known allergies   Past Medical  History:    Reviewed history from 05/29/2006 and no changes required:       Obesity       Hypertension       H/o multiple episodes of PE, DVTs 2004       Family h/o colon cancer       Bronchitis       H/o Dizziness, MRI/MRA neg, ENT f/u pen dec 2007       H/o menorrhagia with h/o fibroids       H/o iron def anemialast Hb 10.2 3/07       Left partial mastectomy with fibrocystic, phylloids tumour changes       S/p colonoscopy 2001 internal hemorrhois, no polyps       H/o self limited episode of Afib 2003    Risk Factors: Tobacco use:  never Passive smoke exposure:  no Drug use:  no HIV high-risk behavior:  no Alcohol use:  no Exercise:  no Seatbelt use:  100 %  Family History Risk Factors:    Family History of MI in females < 33 years old:  no    Family History of MI in males < 56 years old:  no   Review of Systems      See HPI   Physical Exam  General:  alert and overweight-appearing.   Head:     normocephalic, atraumatic, and no abnormalities observed.   Eyes:     vision grossly intact, pupils equal, pupils round, and pupils reactive to light.   Ears:     no external deformities.   Nose:     no external deformity.   Mouth:     pharynx pink and moist, no erythema, and no exudates.   Neck:     supple.   Lungs:     normal respiratory effort and normal breath sounds.   Heart:     normal rate, regular rhythm, and no murmur.   Abdomen:     soft, non-tender, and normal bowel sounds.   Pulses:     Normal peripheral pulses Extremities:     No edema or calf swelling Neurologic:     alert & oriented X3, cranial nerves II-XII intact, and strength normal in all extremities.   Axillary Nodes:     No axillary LN.  Psych:     Oriented X3 and normally interactive.      Impression & Recommendations:  Problem # 1:  LUMP OR MASS IN BREAST (ICD-611.72) Assessment: Comment Only Noted on recent CT. Per patient she is already following-up for this with the [facility]  hospital. Will follow their recommendation on this.   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 Alexandria Lodge.  Her updated medication list for this problem includes:    Warfarin Sodium 10 Mg Tabs (Warfarin sodium) .Marland Kitchen... Tablet strength: 10mg  take as directed   Problem # 3:  HYPERTENSION (ICD-401.9) Continue present regimen. Will check b-met to keep an eye on electrolytes and renal function.  Her updated medication list for this problem includes:    Maxzide-25 37.5-25 Mg Tabs (Triamterene-hctz) .Marland Kitchen... Take 1 tablet by mouth once a day  Orders: T-Comprehensive Metabolic Panel ([id number])   Problem # 4:  OBESITY NOS (ICD-278.00) Will ask her to continue with life style modifications for now. Will consider restarting sibutramine when she comes for follow-up.  Problem # 5:  BRONCHITIS NOS (ICD-490) Stable at the moment. Will go ahead with the prescription for albuterol, and review during next visit. A possibility of OSA was raised during last hospital visit. Will consider sleep studies if breathing troublesome.   Her updated medication list for this problem includes:    Proventil Hfa 108 (90 Base) Mcg/act Aers (Albuterol sulfate) .Marland Kitchen... 2 puffs as required for breathing problem.   Complete Medication List: 1)  Maxzide-25 37.5-25 Mg Tabs (Triamterene-hctz) .... Take 1 tablet by mouth once a day 2)  Ferrous Sulfate 325 (65 Fe) Mg Tabs (Ferrous sulfate) .... Take 1 tablet by mouth once a day 3)  Omeprazole 40 Mg Cpdr (Omeprazole) .... Take 1 capsule by mouth two times a day 4)  Warfarin Sodium 10 Mg Tabs (Warfarin sodium) .... Tablet strength: 10mg  take as directed 5)  Proventil Hfa 108 (90 Base) Mcg/act Aers (Albuterol sulfate) .... 2 puffs as required for breathing problem.   Patient Instructions: 1)  Please schedule a follow-up appointment in 1 month. 2)  Your prescriptions have been sent to your pharmacy. Please let us know if you need  any refills.   Prescriptions: WARFARIN SODIUM 10 MG TABS (WARFARIN SODIUM) Tablet Strength: 10mg  Take as directed  #50 x 3   Entered and Authorized by:   Zara Council MD   Signed by:   Zara Council MD on 03/08/2008   Method used:  Electronically sent to ...       [facility]  [address] [facility] [phone]*       [address] [place], [place]  [address]       Ph: [phone] or [phone]       Fax: [phone]   RxID:   [id number] PROVENTIL HFA 108 (90 BASE) MCG/ACT  AERS (ALBUTEROL SULFATE) 2 puffs as required for breathing problem.  #1 x 3   Entered and Authorized by:   Zara Council MD   Signed by:   Zara Council MD on 03/08/2008   Method used:   Electronically sent to ...       [facility]  [address] [facility] [phone]*       [address] [place], [place]  [address]       Ph: [phone] or [phone]       Fax: [phone]   RxID:   [id number]  ]

## 2010-09-19 NOTE — Progress Notes (Signed)
Summary: Office Visit: Work Conservation officer, historic buildings Visit: Work Excuse   Imported By: Florinda Marker 03/07/2007 15:40:04  _____________________________________________________________________  External Attachment:    Type:   Image     Comment:   External Document

## 2010-09-19 NOTE — Assessment & Plan Note (Signed)
Summary: COUM/ SB.  Anticoagulant Therapy Managed by: Barbera Setters. Janie Morning  PharmD CACP PCP: Charlaine Dalton Attending: Margarito Liner MD Indication 1: Deep vein thrombus Indication 2: Encounter for therapeutic drug monitoring  V58.83 Start date: 07/01/2003 Duration: Indefinite  Patient Assessment Reviewed by: Chancy Milroy PharmD  March 08, 2008 Medication review: verified warfarin dosage & schedule,verified previous prescription medications, verified doses & any changes, verified new medications, reviewed OTC medications, reviewed OTC health products-vitamins supplements etc Complications: none Dietary changes: none   Health status changes: none   Lifestyle changes: none   Recent/future hospitalizations: none   Recent/future procedures: none   Recent/future dental: none Patient Assessment Part 2:  Have you MISSED ANY DOSES or CHANGED TABLETS?  No missed Warfarin doses or changed tablets.  Have you had any BRUISING or BLEEDING ( nose or gum bleeds,blood in urine or stool)?  No reported bruising or bleeding in nose, gums, urine, stool.  Have you STARTED or STOPPED any MEDICATIONS, including OTC meds,herbals or supplements?  No other medications or herbal supplements were started or stopped.  Have you CHANGED your DIET, especially green vegetables,or ALCOHOL intake?  No changes in diet or alcohol intake.  Have you had any ILLNESSES or HOSPITALIZATIONS?  No reported illnesses or hospitalizations  Have you had any signs of CLOTTING?(chest discomfort,dizziness,shortness of breath,arms tingling,slurred speech,swelling or redness in leg)    No chest discomfort, dizziness, shortness of breath, tingling in arm, slurred speech, swelling, or redness in leg.     Treatment  Target INR: 2.0-3.0 INR: 2.1  Date: 03/08/2008 Regimen In:  110.0mg /week INR reflects regimen in: 2.1  New  Tablet strength: : 10mg  Regimen Out:     Sunday: 1 & 1/2 Tablet     Monday: 2 Tablet     Tuesday: 1 & 1/2  Tablet     Wednesday: 2 Tablet     Thursday: 1 & 1/2 Tablet      Friday: 2 Tablet     Saturday: 1 & 1/2 Tablet Total Weekly: 120.0mg /week mg  Next INR Due: 04/05/2008 Adjusted by: Barbera Setters. Alexandria Lodge III PharmD CACP   Return to anticoagulation clinic:  04/05/2008 Time of next visit: 1430

## 2010-09-19 NOTE — Assessment & Plan Note (Signed)
Summary: COU/VS  Anticoagulant Therapy Managed by: Barbera Setters. Janie Morning  PharmD CACP PCP: Charlaine Dalton Attending: Manning Charity MD Indication 1: Deep vein thrombus Indication 2: Encounter for therapeutic drug monitoring  V58.83 Start date: 07/01/2003 Duration: Indefinite  Patient Assessment Reviewed by: Chancy Milroy PharmD  March 31, 2007 Medication review: verified warfarin dosage & schedule,verified previous prescription medications, verified doses & any changes, verified new medications, reviewed OTC medications, reviewed OTC health products-vitamins supplements etc Complications: none Dietary changes: none   Health status changes: none   Lifestyle changes: none   Recent/future hospitalizations: none   Recent/future procedures: none   Recent/future dental: none Patient Assessment Part 2:  Have you MISSED ANY DOSES or CHANGED TABLETS?  No missed Warfarin doses or changed tablets.  Have you had any BRUISING or BLEEDING ( nose or gum bleeds,blood in urine or stool)?  No reported bruising or bleeding in nose, gums, urine, stool.  Have you STARTED or STOPPED any MEDICATIONS, including OTC meds,herbals or supplements?  No other medications or herbal supplements were started or stopped.  Have you CHANGED your DIET, especially green vegetables,or ALCOHOL intake?  No changes in diet or alcohol intake.  Have you had any ILLNESSES or HOSPITALIZATIONS?  No reported illnesses or hospitalizations  Have you had any signs of CLOTTING?(chest discomfort,dizziness,shortness of breath,arms tingling,slurred speech,swelling or redness in leg)    No chest discomfort, dizziness, shortness of breath, tingling in arm, slurred speech, swelling, or redness in leg.     Treatment  Target INR: 2.0-3.0 INR: 1.9  Date: 03/31/2007 Regimen In:  125.0mg /week INR reflects regimen in: 1.9  New  Tablet strength: : 10mg  Regimen Out:     Sunday: 2 Tablet     Monday: 2 Tablet     Tuesday: 2 Tablet  Wednesday: 1 & 1/2 Tablet     Thursday: 2 Tablet      Friday: 2 Tablet     Saturday: 2 Tablet Total Weekly: 135.0mg /week mg  Next INR Due: 04/28/2007 Adjusted by: Barbera Setters. Alexandria Lodge III PharmD CACP   Return to anticoagulation clinic:  04/28/2007 Time of next visit: 1415     Prescriptions: COUMADIN 10 MG TAB (WARFARIN SODIUM) Take as directed.  #60 x 1   Entered by:   Chancy Milroy PharmD   Authorized by:   Manning Charity MD   Signed by:   Chancy Milroy PharmD on 03/31/2007   Method used:   Print then Give to Patient   RxID:   [id number]

## 2010-09-19 NOTE — Assessment & Plan Note (Signed)
Summary: coumadin [mkj]  Anticoagulant Therapy Managed by: Barbera Setters. Janie Morning  PharmD CACP PCP: Charlaine Dalton Attending: Lowella Bandy MD Indication 1: Deep vein thrombus Indication 2: Encounter for therapeutic drug monitoring  V58.83 Start date: 07/01/2003 Duration: Indefinite  Patient Assessment Reviewed by: Chancy Milroy PharmD  July 19, 2008 Medication review: verified warfarin dosage & schedule,verified previous prescription medications, verified doses & any changes, verified new medications, reviewed OTC medications, reviewed OTC health products-vitamins supplements etc Complications: none Dietary changes: none   Health status changes: none   Lifestyle changes: none   Recent/future hospitalizations: none   Recent/future procedures: none   Recent/future dental: none Patient Assessment Part 2:  Have you MISSED ANY DOSES or CHANGED TABLETS?  No missed Warfarin doses or changed tablets.  Have you had any BRUISING or BLEEDING ( nose or gum bleeds,blood in urine or stool)?  No reported bruising or bleeding in nose, gums, urine, stool.  Have you STARTED or STOPPED any MEDICATIONS, including OTC meds,herbals or supplements?  No other medications or herbal supplements were started or stopped.  Have you CHANGED your DIET, especially green vegetables,or ALCOHOL intake?  No changes in diet or alcohol intake.  Have you had any ILLNESSES or HOSPITALIZATIONS?  No reported illnesses or hospitalizations  Have you had any signs of CLOTTING?(chest discomfort,dizziness,shortness of breath,arms tingling,slurred speech,swelling or redness in leg)    No chest discomfort, dizziness, shortness of breath, tingling in arm, slurred speech, swelling, or redness in leg.     Treatment  Target INR: 2.0-3.0 INR: 1.7  Date: 07/19/2008 Regimen In:  135.0mg /week INR reflects regimen in: 1.7  New  Tablet strength: : 10mg  Regimen Out:     Sunday: 2 Tablet     Monday: 2 Tablet     Tuesday: 2  Tablet     Wednesday: 2 Tablet     Thursday: 2 Tablet      Friday: 2 Tablet     Saturday: 2 Tablet Total Weekly: 140.0mg /week mg  Next INR Due: 08/09/2008 Adjusted by: Barbera Setters. Alexandria Lodge III PharmD CACP   Return to anticoagulation clinic:  08/09/2008 Time of next visit: 1445

## 2010-09-19 NOTE — Assessment & Plan Note (Signed)
Summary: SB.  Anticoagulant Therapy Managed by: Barbera Setters. Janie Morning  PharmD CACP PCP: Shannan Harper Indication 1: Deep vein thrombus Indication 2: Encounter for therapeutic drug monitoring  V58.83 Start date: 07/01/2003 Duration: Indefinite              Appended Document: Coumadin Clinic    Anticoagulant Therapy Managed by: Barbera Setters. Janie Morning  PharmD CACP PCP: Charlaine Dalton Attending: Manning Charity MD Indication 1: Deep vein thrombus Indication 2: Encounter for therapeutic drug monitoring  V58.83 Start date: 07/01/2003 Duration: Indefinite  Patient Assessment Reviewed by: Chancy Milroy PharmD  May 26, 2007 Medication review: verified warfarin dosage & schedule,verified previous prescription medications, verified doses & any changes, verified new medications, reviewed OTC medications, reviewed OTC health products-vitamins supplements etc Complications: none Dietary changes: none   Health status changes: none   Lifestyle changes: none   Recent/future hospitalizations: none   Recent/future procedures: none   Recent/future dental: none Patient Assessment Part 2:  Have you MISSED ANY DOSES or CHANGED TABLETS?  No missed Warfarin doses or changed tablets.  Have you had any BRUISING or BLEEDING ( nose or gum bleeds,blood in urine or stool)?  No reported bruising or bleeding in nose, gums, urine, stool.  Have you STARTED or STOPPED any MEDICATIONS, including OTC meds,herbals or supplements?  No other medications or herbal supplements were started or stopped.  Have you CHANGED your DIET, especially green vegetables,or ALCOHOL intake?  No changes in diet or alcohol intake.  Have you had any ILLNESSES or HOSPITALIZATIONS?  No reported illnesses or hospitalizations  Have you had any signs of CLOTTING?(chest discomfort,dizziness,shortness of breath,arms tingling,slurred speech,swelling or redness in leg)    No chest discomfort, dizziness, shortness of breath, tingling in  arm, slurred speech, swelling, or redness in leg.     Treatment  Target INR: 2.0-3.0 INR: 1.9  Date: 05/26/2007 Regimen In:  140mg /wk INR reflects regimen in: 1.9  New  Tablet strength: : 10mg  Regimen Out:     Sunday: 2 Tablet     Monday: 2 & 1/2 Tablet     Tuesday: 2 Tablet     Wednesday: 2 & 1/2 Tablet     Thursday: 2 Tablet      Friday: 2 & 1/2 Tablet     Saturday: 2 Tablet Total Weekly: 155.0mg /week mg  Next INR Due: 06/02/2007 Adjusted by: Barbera Setters. Alexandria Lodge III PharmD CACP   Return to anticoagulation clinic:  06/02/2007 Time of next visit: 1400   Comments: Discussed with Dr. Lyda Perone:  Patient has cited recent weight gain, and  sluggishness.  She has documented anemia and mennorrhagia. Given the sluggishness of her response to escalating doses of warfarin, we elect to get a thyroid panel to determine if she might be hypothyroid. Dr. Lyda Perone agrees and has ordered these tests.

## 2010-09-19 NOTE — Progress Notes (Signed)
  Phone Note From Other Clinic   Caller: [name], Lab [facility] Call For: [name] PharmD CACP Reason for Call: Schedule Patient Appt, Medication Check, Diagnosis Check Request: Talk with Provider, Call Patient Action Taken: Phone Call Completed, Provider Notified, Patient called Details for Reason: Called by French Ana in [facility] reporting INR for patient seen by their PCP today outside of my clinic. Details of Complaint: INR = 1.4  This reflects per patient reporting--that she has ONLY been taking 1/2 of last prescribed dose. She was supposed to be taking 2x10mg  for 6 days of week and 2 and 1/2 x 10mg  (25mg ) on ONE day of the week. She has only been taking 10mg  warfarin by mouth once daily per her reporting. She denies any symptoms of VTE or signs suggestive of bleeding to me. Details of Action Taken: Patient has been instructed to recommence taking 2x10mg  = 20mg  warfarin by mouth once daily and see me in follow up in the anticoagulation clinic on 6-Jun-11 at 2:30pm. Summary of Call: Telemanagment of warfarin. Initial call taken by: [name] PharmD CACP

## 2010-09-19 NOTE — Assessment & Plan Note (Signed)
Summary: ACUTE-PERSISTANT COUGH X 1WK/(YERRABAPU)/CFB   Vital Signs:  Patient Profile:   50 Years Old Female Height:     71 inches (180.34 cm) Weight:      276.01 pounds (125.46 kg) O2 Sat:      100 % Temp:     97.8 degrees F (36.56 degrees C) oral Pulse rate:   75 / minute BP sitting:   116 / 70  (right arm)  Pt. in pain?   yes    Location:   chest    Intensity:   10    Type:       ache  Vitals Entered By: Angelina Ok RN (December 03, 2006 2:50 PM) Oxygen therapy Room Air              Is Patient Diabetic? No Nutritional Status Obese  Have you ever been in a relationship where you felt threatened, hurt or afraid?No   Does patient need assistance? Functional Status Self care Ambulation Normal   Chief Complaint:  Cough and clear thick mucous.  History of Present Illness: Patient states was diagnosed with pneumonia in ED 2 weeks ago, and although most of her symptoms have improved, she still has a nagging productive cough of loose clear sputum, occurring during day and at night and embarassing. Patient states does hve postnasal drip sometimes. Patient denies any fever, nausea, emesis, SOB, CP. Patient denies any use of ACE inhibitors and denies any greater frequency of cough in the supine position. Patient denies any heartburn, or belching. Patient states took full course of antibiotics, but was unable to afford the  tussionex. No other complaints.  Prior Medications (reviewed today): MAXZIDE-25 37.5-25 MG TABS (TRIAMTERENE-HCTZ) Take 1 tablet by mouth once a day ALLEGRA-D 60-120 MG TB12 (FEXOFENADINE-PSEUDOEPHEDRINE) 60 mg two times a day as needed FERROUS SULFATE 325 (65 FE) MG TABS (FERROUS SULFATE) Take 1 tablet by mouth once a day VITAMIN E 200 UNIT CAPS (VITAMIN E) Take 1 tablet by mouth once a day VITAMIN C 500 MG TABS (ASCORBIC ACID) Take 1 tablet by mouth once a day Current Allergies (reviewed today): No known allergies     Risk Factors:  Tobacco use:  quit   Year quit:  years Passive smoke exposure:  no Drug use:  no HIV high-risk behavior:  no Alcohol use:  no Exercise:  no  Family History Risk Factors:    Family History of MI in females < 73 years old:  no    Family History of MI in males < 30 years old:  no   Review of Systems       The patient complains of prolonged cough.  The patient denies fever, hoarseness, chest pain, syncope, dyspnea on exhertion, and hemoptysis.         Per HPI   Physical Exam  General:     alert, well-developed, well-nourished, and normal appearance.   Head:     normocephalic and atraumatic.   Eyes:     vision grossly intact, pupils equal, pupils round, pupils reactive to light, and pupils react to accomodation.   Ears:     R ear normal and L ear normal.   Nose:     Nasal turbinates nonerythemous, nonedematous, with clear nasal discharge. Mouth:     pharynx pink and moist, no erythema, no exudates, and no lesions.   Neck:     supple, full ROM, and no JVD.   Lungs:     normal respiratory effort, no intercostal retractions, no  accessory muscle use, normal breath sounds, no crackles, and no wheezes.   Heart:     normal rate, regular rhythm, no murmur, no gallop, no rub, and no JVD.   Abdomen:     soft, non-tender, normal bowel sounds, no distention, no masses, no guarding, no rigidity, and no rebound tenderness.   Msk:     normal ROM.   Pulses:     2 + BDP and radial pulses Extremities:     No c/c/e    Impression & Recommendations:  Problem # 1:  COUGH (ICD-786.2) Likely secondary to postnasal drip. Patient recently treated for PNA, with some sxs of postnasal. Will treat symptomatically with tessalon Perles 100mg  three times a day x 10 days. If cough unimproved after 2 weeks may consider a trial of PPI for presumed GERD. Patient not on an ACE inhibitor. Patient advised if worsening sxs and no improvement with cough to call back to be seen. Follow up with PCP in next 1-2 months.  Problem #  2:  HYPERTENSION (ICD-401.9) Well controlled. Continue current medications with maxzide. Her updated medication list for this problem includes:    Maxzide-25 37.5-25 Mg Tabs (Triamterene-hctz) .Marland Kitchen... Take 1 tablet by mouth once a day   Medications Added to Medication List This Visit: 1)  Maxzide-25 37.5-25 Mg Tabs (Triamterene-hctz) .... Take 1 tablet by mouth once a day 2)  Ferrous Sulfate 325 (65 Fe) Mg Tabs (Ferrous sulfate) .... Take 1 tablet by mouth once a day 3)  Vitamin E 200 Unit Caps (Vitamin e) .... Take 1 tablet by mouth once a day 4)  Vitamin C 500 Mg Tabs (Ascorbic acid) .... Take 1 tablet by mouth once a day 5)  Tessalon Perles 100 Mg Caps (Benzonatate) .Marland Kitchen.. 1 tablet 3 times daily   Patient Instructions: 1)  Follow up with PCP in next 1-2 months for chronic issues 2)  Tessalon Perles 100mg  1 tablet 3 times daily x 10 days 3)  If no improvement with cough and worsening symptoms after 10-14 days, please call back to be seen in the clinic. 4)  Continue current medications. Prescriptions: TESSALON PERLES 100 MG CAPS (BENZONATATE) 1 tablet 3 times daily  #30 x 1   Entered and Authorized by:   Ramiro Harvest MD   Signed by:   Ramiro Harvest MD on 12/03/2006   Method used:   Print then Give to Patient   RxID:   [id number]

## 2010-09-19 NOTE — Miscellaneous (Signed)
Summary: LIPIDS  Clinical Lists Changes  Observations: Added new observation of LDL: 116 mg/dL (82/95/6213 08:65) Added new observation of HDL: 37 mg/dL (78/46/9629 52:84) Added new observation of TRIGLYC TOT: 67 mg/dL (13/24/4010 27:25) Added new observation of CHOLESTEROL: 166 mg/dL (36/64/4034 74:25)

## 2010-09-19 NOTE — Progress Notes (Signed)
  Phone Note Other Incoming   Caller: [facility]. Reason for Call: Discuss lab or test results Summary of Call: Call from [facility] with results of PT (30) and INR (2.8). Patient is following with Dr. Alexandria Lodge on Tuesday and currently has been using coumadin daily 10mg  with alternated 15mg  dose. [facility] reported that she was woozing to much from her wound and since she is in the high normal therapeutic range, I have order for her not to take any more 15mg  and to continue just with 10mg  daily until she follow with Dr. Alexandria Lodge on Tuesday. [facility] will try to contact Dr. Alexandria Lodge through his pager number tomorrow as well for further instructions.

## 2010-09-30 ENCOUNTER — Emergency Department (HOSPITAL_COMMUNITY): Payer: Self-pay

## 2010-09-30 ENCOUNTER — Observation Stay (HOSPITAL_COMMUNITY)
Admission: EM | Admit: 2010-09-30 | Discharge: 2010-10-01 | DRG: 313 | Disposition: A | Discharge status: Home / Self Care | Payer: Self-pay | Source: Other Acute Inpatient Hospital | Attending: Internal Medicine | Admitting: Internal Medicine

## 2010-09-30 ENCOUNTER — Encounter: Payer: Self-pay | Admitting: Internal Medicine

## 2010-09-30 ENCOUNTER — Emergency Department (HOSPITAL_COMMUNITY)
Admission: EM | Admit: 2010-09-30 | Discharge: 2010-09-30 | Disposition: A | Discharge status: Other Acute Inpatient Hospital | Payer: Self-pay | Attending: Emergency Medicine | Admitting: Emergency Medicine

## 2010-09-30 DIAGNOSIS — Z86718 Personal history of other venous thrombosis and embolism: Secondary | ICD-10-CM | POA: Insufficient documentation

## 2010-09-30 DIAGNOSIS — R110 Nausea: Secondary | ICD-10-CM | POA: Insufficient documentation

## 2010-09-30 DIAGNOSIS — D509 Iron deficiency anemia, unspecified: Secondary | ICD-10-CM | POA: Insufficient documentation

## 2010-09-30 DIAGNOSIS — R0789 Other chest pain: Principal | ICD-10-CM | POA: Insufficient documentation

## 2010-09-30 DIAGNOSIS — Z86711 Personal history of pulmonary embolism: Secondary | ICD-10-CM | POA: Insufficient documentation

## 2010-09-30 DIAGNOSIS — R079 Chest pain, unspecified: Secondary | ICD-10-CM | POA: Insufficient documentation

## 2010-09-30 DIAGNOSIS — I10 Essential (primary) hypertension: Secondary | ICD-10-CM | POA: Insufficient documentation

## 2010-09-30 DIAGNOSIS — E669 Obesity, unspecified: Secondary | ICD-10-CM | POA: Insufficient documentation

## 2010-09-30 DIAGNOSIS — M542 Cervicalgia: Secondary | ICD-10-CM | POA: Insufficient documentation

## 2010-09-30 LAB — DIFFERENTIAL
Basophils Absolute: 0 10*3/uL (ref 0.0–0.1)
Basophils Relative: 0 % (ref 0–1)
Eosinophils Absolute: 0.1 10*3/uL (ref 0.0–0.7)
Eosinophils Relative: 1 % (ref 0–5)
Lymphocytes Relative: 46 % (ref 12–46)
Lymphs Abs: 2.8 10*3/uL (ref 0.7–4.0)
Monocytes Absolute: 0.4 10*3/uL (ref 0.1–1.0)
Monocytes Relative: 7 % (ref 3–12)
Neutro Abs: 2.9 10*3/uL (ref 1.7–7.7)
Neutrophils Relative %: 46 % (ref 43–77)

## 2010-09-30 LAB — POCT I-STAT, CHEM 8
BUN: 10 mg/dL (ref 6–23)
Calcium, Ion: 1.19 mmol/L (ref 1.12–1.32)
Chloride: 102 mEq/L (ref 96–112)
Creatinine, Ser: 1 mg/dL (ref 0.4–1.2)
Glucose, Bld: 85 mg/dL (ref 70–99)
HCT: 36 % (ref 36.0–46.0)
Hemoglobin: 12.2 g/dL (ref 12.0–15.0)
Potassium: 3.6 mEq/L (ref 3.5–5.1)
Sodium: 139 mEq/L (ref 135–145)
TCO2: 24 mmol/L (ref 0–100)

## 2010-09-30 LAB — PROTIME-INR
INR: 0.99 (ref 0.00–1.49)
Prothrombin Time: 13.3 seconds (ref 11.6–15.2)

## 2010-09-30 LAB — CBC
HCT: 33.9 % — ABNORMAL LOW (ref 36.0–46.0)
Hemoglobin: 11.8 g/dL — ABNORMAL LOW (ref 12.0–15.0)
MCH: 29.6 pg (ref 26.0–34.0)
MCHC: 34.8 g/dL (ref 30.0–36.0)
MCV: 85 fL (ref 78.0–100.0)
Platelets: 297 10*3/uL (ref 150–400)
RBC: 3.99 MIL/uL (ref 3.87–5.11)
RDW: 13.6 % (ref 11.5–15.5)
WBC: 6.2 10*3/uL (ref 4.0–10.5)

## 2010-09-30 LAB — HEPATIC FUNCTION PANEL
ALT: 27 U/L (ref 0–35)
AST: 21 U/L (ref 0–37)
Albumin: 3.3 g/dL — ABNORMAL LOW (ref 3.5–5.2)
Alkaline Phosphatase: 53 U/L (ref 39–117)
Bilirubin, Direct: 0.1 mg/dL (ref 0.0–0.3)
Indirect Bilirubin: 0.3 mg/dL (ref 0.3–0.9)
Total Bilirubin: 0.4 mg/dL (ref 0.3–1.2)
Total Protein: 6.7 g/dL (ref 6.0–8.3)

## 2010-09-30 LAB — APTT: aPTT: 31 seconds (ref 24–37)

## 2010-09-30 LAB — LIPASE, BLOOD: Lipase: 32 U/L (ref 11–59)

## 2010-09-30 LAB — POCT CARDIAC MARKERS
CKMB, poc: 1.4 ng/mL (ref 1.0–8.0)
Myoglobin, poc: 49.6 ng/mL (ref 12–200)
Troponin i, poc: <0.05 ng/mL (ref 0.00–0.09)

## 2010-09-30 LAB — D-DIMER, QUANTITATIVE: D-Dimer, Quant: 0.22 ug/mL-FEU (ref 0.00–0.48)

## 2010-10-01 LAB — LIPID PANEL
Cholesterol: 165 mg/dL (ref 0–200)
HDL: 46 mg/dL (ref >39)
LDL Cholesterol: 111 mg/dL — ABNORMAL HIGH (ref 0–99)
Total CHOL/HDL Ratio: 3.6 RATIO
Triglycerides: 42 mg/dL (ref <150)
VLDL: 8 mg/dL (ref 0–40)

## 2010-10-01 LAB — CBC
HCT: 34.5 % — ABNORMAL LOW (ref 36.0–46.0)
Hemoglobin: 11.9 g/dL — ABNORMAL LOW (ref 12.0–15.0)
MCH: 29.1 pg (ref 26.0–34.0)
MCHC: 34.5 g/dL (ref 30.0–36.0)
MCV: 84.4 fL (ref 78.0–100.0)
Platelets: 285 10*3/uL (ref 150–400)
RBC: 4.09 MIL/uL (ref 3.87–5.11)
RDW: 13.6 % (ref 11.5–15.5)
WBC: 5.7 10*3/uL (ref 4.0–10.5)

## 2010-10-01 LAB — CARDIAC PANEL(CRET KIN+CKTOT+MB+TROPI)
CK, MB: 1.2 ng/mL (ref 0.3–4.0)
CK, MB: 1.3 ng/mL (ref 0.3–4.0)
CK, MB: 1.3 ng/mL (ref 0.3–4.0)
Relative Index: 1.1 (ref 0.0–2.5)
Relative Index: 1.2 (ref 0.0–2.5)
Relative Index: 1.2 (ref 0.0–2.5)
Total CK: 110 U/L (ref 7–177)
Total CK: 109 U/L (ref 7–177)
Total CK: 108 U/L (ref 7–177)
Troponin I: <0.01 ng/mL (ref 0.00–0.06)
Troponin I: <0.01 ng/mL (ref 0.00–0.06)
Troponin I: 0.01 ng/mL (ref 0.00–0.06)

## 2010-10-01 LAB — HEMOGLOBIN A1C
Hgb A1c MFr Bld: 5.6 % (ref <5.7)
Mean Plasma Glucose: 114 mg/dL (ref <117)

## 2010-10-01 LAB — HEPARIN LEVEL (UNFRACTIONATED): Heparin Unfractionated: 0.79 IU/mL — ABNORMAL HIGH (ref 0.30–0.70)

## 2010-10-01 LAB — TSH: TSH: 0.705 u[IU]/mL (ref 0.350–4.500)

## 2010-10-01 LAB — HOMOCYSTEINE: Homocysteine: 4.3 umol/L (ref 4.0–15.4)

## 2010-10-02 ENCOUNTER — Telehealth: Payer: Self-pay | Admitting: *Deleted

## 2010-10-02 LAB — LUPUS ANTICOAGULANT PANEL
DRVVT: 38.6 secs (ref 36.2–44.3)
Lupus Anticoagulant: NOT DETECTED
PTT Lupus Anticoagulant: 37.1 secs (ref 30.0–45.6)

## 2010-10-02 LAB — CARDIOLIPIN ANTIBODIES, IGG, IGM, IGA
Anticardiolipin IgA: 4 APL U/mL — ABNORMAL LOW (ref <22)
Anticardiolipin IgG: 2 GPL U/mL — ABNORMAL LOW (ref <23)
Anticardiolipin IgM: 4 MPL U/mL — ABNORMAL LOW (ref <11)

## 2010-10-02 LAB — ANTITHROMBIN III: AntiThromb III Func: 133 % — ABNORMAL HIGH (ref 76–126)

## 2010-10-02 LAB — BETA-2-GLYCOPROTEIN I ABS, IGG/M/A
Beta-2 Glyco I IgG: 0 G Units (ref <20)
Beta-2-Glycoprotein I IgA: 8 A Units (ref <20)
Beta-2-Glycoprotein I IgM: 4 M Units (ref <20)

## 2010-10-02 LAB — PROTEIN S ACTIVITY: Protein S Activity: 82 % (ref 69–129)

## 2010-10-02 LAB — PROTEIN C ACTIVITY: Protein C Activity: 134 % — ABNORMAL HIGH (ref 75–133)

## 2010-10-02 NOTE — Telephone Encounter (Signed)
Pt calls and states she needs a return to work note, addressed to: attn: BJ freeman fax # [phone], i am sending this to several md's since i cannot see a disch note at the moment.

## 2010-10-03 ENCOUNTER — Encounter: Payer: Self-pay | Admitting: Internal Medicine

## 2010-10-04 LAB — FACTOR 5 LEIDEN

## 2010-10-04 LAB — PROTEIN S, TOTAL: Protein S Ag, Total: 100 % (ref 70–140)

## 2010-10-04 LAB — PROTHROMBIN GENE MUTATION

## 2010-10-04 LAB — PROTEIN C, TOTAL: Protein C, Total: 139 % (ref 70–140)

## 2010-10-04 NOTE — Telephone Encounter (Signed)
Ella Bodo, I only worked to admit this patient overnight. I believe Dr. Dorthula Rue helped to care for and discharge the patient. Therefore, she may be able to provided the needed documentation. Thank you.  Sincerely, Burnett Harry

## 2010-10-11 NOTE — Initial Assessments (Signed)
INTERNAL MEDICINE ADMISSION HISTORY AND PHYSICAL Attending: Dr. Ninetta Lights First contact: Dr. Anselm Jungling [phone]) Second contact: Dr. Denton Meek [phone]) Weekend, After Hours: First Contact [phone]), Second Contact [phone])   CC: Neck pain/Left shoulder/Chest pain  HPI: Patient is a 50 yr old F with pmhx as described below who presents to [facility] for evaluation of worsening neck and back pain that started on the day of admission and which are associated with left shoulder/arm numbness and chest pain. Neck pain was described as being throbbing in nature, localized to left neck, without neck stiffness. Afterwards, patient had onset of left shoulder pain described as numbness which originates from left shoulder and extends to left wrist. This shoulder pain has been ongoing for a few weeks, but acutely intensified prior to admission. Patient also describes chest pain located under left breast described as sharp, constant, not associated with palpitations, diaphoresis, or SOB. Chest pain start while at rest, without aggravating or alleviating factors. Patient also complains of achy upper back pain. The constellation of pain prompted the patient's presentation today. Reports that current symptoms are not like PE episodes. Denies fever, chills, coughing, blurry vision, slurred speech, focal weakness, n/v/diarrhea. No recent trauma, falls, car accidents. 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 1) MAXZIDE-25 37.5-25 MG TABS (TRIAMTERENE-HCTZ) Take 1 tablet by mouth once a day 2) FERROUS SULFATE 325 (65 FE) MG TABS (FERROUS SULFATE) Take 1 tablet by mouth once a day 3) WARFARIN SODIUM 10 MG TABS (WARFARIN SODIUM) Tablet Strength: 10mg . Please provide a scored tablet. Take one and one half pill by mouth once daily until you see Dr Alexandria Lodge on 04/25/10  SOCIAL HISTORY: Engaged. Works in Office manager. No alcohol, drugs, or tobacco use.  FAMILY HISTORY: Colon cancer - Mother CAD with MI - Father, died age 91yo secondary to MI DVT, PE - Sister, Aunt Diabetes HTN Multiple cancer diagnoses incling bone cancer, stomach cancer, colorectal cancer.  ROS: per HPI  VITALS: T:97.9  P:75  BP:120/84  R:20  O2SAT:98%  ON: RA  PHYSICAL EXAM: General:  alert, well-developed, NAD, cooperative, A&Ox3 Head:  normocephalic and atraumatic.   Eyes:  PERRLA, EOMI, vision grossly intact, conjuctive and sclerae within normal limits.   Mouth:  MMM, no erythema, no exudates, or lesions.   Neck:  supple, full ROM, trachea midline, no palp masses, no JVD, no carotid bruits.   Lungs:  CTAB, normal respiratory effort Heart: RRR, no M/R/G Abdomen:  soft, NT, ND, BS present and normoactive, no palpable masses Musculoskeletal: hypertonisity of left trapezius muscle Ext: ttp over bicipital groove, pain on left shoulder raising arm above head, empty can test positive on left side, no edema or cyanosis lower extremities, Homan's sign negative BL. Neurologic:  CN II-XII intact,+5 strength globally, sensation grossly intact.   Skin:  turgor normal and no rashes.    LABS: iSTAT Chem 8 TCO2  24                0-100            mmol/L  Ionized Calcium                         1.19              1.12-1.32        mmol/L  Hemoglobin (HGB)                        12.2               12.0-15.0        g/dL  Hematocrit (HCT)                        36.0              36.0-46.0        %  Sodium (NA)                             139               135-145          mEq/L  Potassium (K)                           3.6               3.5-5.1          mEq/L  Chloride                                102               96-112           mEq/L  Glucose                                 85                70-99            mg/dL  BUN                                     10                6-23             mg/dL  Creatinine                              1.0               0.4-1.2          mg/dL  CBC WBC                                      6.2               4.0-10.5  K/uL  RBC                                     3.99              3.87-5.11        MIL/uL  Hemoglobin (HGB)                        11.8       l      12.0-15.0        g/dL  Hematocrit (HCT)                        33.9       l      36.0-46.0        %  MCV                                     85.0              78.0-100.0       fL  MCH -                                   29.6              26.0-34.0        pg  MCHC                                    34.8              30.0-36.0        g/dL  RDW                                     13.6              11.5-15.5        %  Platelet Count (PLT)                    297               150-400          K/uL  Neutrophils, %                          46                43-77            %  Lymphocytes, %                          46                12-46            %  Monocytes, %                            7  3-12             %  Eosinophils, %                          1                 0-5              %  Basophils, %                            0                 0-1              %  Neutrophils, Absolute                   2.9               1.7-7.7          K/uL  Lymphocytes, Absolute                   2.8               0.7-4.0          K/uL  Monocytes, Absolute                      0.4               0.1-1.0          K/uL  Eosinophils, Absolute                   0.1               0.0-0.7          K/uL  Basophils, Absolute                     0.0               0.0-0.1          K/uL  CE: CKMB, POC                                1.4               1.0-8.0          ng/mL  Troponin I, POC                         <0.05             0.00-0.09        ng/mL  Myoglobin, POC                          49.6              12-200           ng/mL  Hepatic Function Panel:  Bilirubin, Total                        0.4               0.3-1.2          mg/dL  Bilirubin, Direct  0.1               0.0-0.3          mg/dL  Indirect Bilirubin                      0.3               0.3-0.9          mg/dL  Alkaline Phosphatase                    53                39-117           U/L  SGOT (AST)                              21                0-37             U/L  SGPT (ALT)                              27                0-35             U/L  Total  Protein                          6.7               6.0-8.3          g/dL  Albumin-Blood                           3.3        l      3.5-5.2          g/dL  Lipase                                   32                11-59            U/L  Protime ( Prothrombin Time)              3.3               11.6-15.2        seconds INR                                      0.99              0.00-1.49 PTT(a-Partial Thromboplastn Time)        31                24-37            seconds   IMAGING: 1) CHEST - 2 View - Mild cardiomegaly.  Central pulmonary vascular prominence.  No segmental infiltrate or congestive heart failure. IMPRESSION: Mild cardiomegaly. Central pulmonary vascular prominence. No segmental infiltrate or congestive heart failure.   ASSESSMENT AND PLAN:  1)Atypical chest pain - Etiology unclear at this time, differential diagnoses are vast including musculoskeletal pain, PE, acute cardiac event, gastritis. Symptoms are most consistent  with musculoskeletal etiology given reproducibility of chest, back, shoulder pain with palpation, and positive extremity exam as described above. However, in the setting of subtherapeutic INR and noncompliance with Coumadin, recurrent PE is a consideration, however although the patient is not tachycardic, tachypneic, or desaturating. Additionally, with siginificant risk factors including obesity, hypertension, medication noncompliance myocardial ishemia/ infarction possible, first set CE negative, EKG showing no new ST/ T changes at this time. Demand ishemia in the setting of chronic iron-deficiency anemia a possibility, although unlikely with stable baseline Hgb, no active bleeding, and no EKG changes. Additionally, patient has extended history of dyspepsia, improved with GI cocktail. PNA/ PTX ruled out via Chest Xray. Physical exam shows signs of rotator cuff tendinopathy may be cause of left shoulder pain.  - Will check D-Dimer, then CT Angio if indicated - Continue cycle cardiac enzymes, cardiology consult if patient rules in. - Serial EKGs - Check TSH - Check Fasting Lipid Panel and HgA1c for risk stratification. - Start full dose lovenox as to bridge to initiate coumadin - Further management of rotator cuff tendinopathy as an outpatient which may include steroid injection and/or physical therapy. Consider Sport medicine referral.   2) Nausea - Abdominal pain seems to be most consitent with chronic gastritis. Patient admitedly not taking prescribed Omeprazole. Lipase within normal limits.  - Will continue with as needed GI cocktail  3) HTN - stable. Continue home medications.  4) VTE PROPH - Full dose Lovenox    I have seen and examined the patient. I reviewed the resident/fellow note and agree with the findings and plan of care as documented. My additions and revisions are included.    Signature:     _____________________________________     Internal Medicine Teaching Service  Attending  Date:       _____________________________________

## 2010-10-17 ENCOUNTER — Encounter: Payer: Self-pay | Admitting: Internal Medicine

## 2010-10-25 NOTE — Discharge Summary (Addendum)
[name], FLIPPO             ACCOUNT NO.:  [id number]  MEDICAL RECORD NO.:  [id number]           PATIENT TYPE:  I  LOCATION:  4705                         FACILITY:  [facility]  PHYSICIAN:  Katrina Stack.D.DATE OF BIRTH:  1961/02/22  DATE OF ADMISSION:  09/30/2010 DATE OF DISCHARGE:  10/01/2010                              DISCHARGE SUMMARY   DISCHARGE DIAGNOSES: 1. Atypical chest pain, most likely musculoskeletal, ruled out for     acute coronary syndrome and pulmonary embolism. 2. Nausea likely secondary to gastritis. 3. Hypertension. 4. Obesity. 5. 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 6. History of menorrhagia with history of uterine fibroids - status     post total abdominal hysterectomy. 7. Iron-deficiency anemia. 8. History of cholecystectomy.  DISCHARGE MEDICATIONS WITH ACCURATE DOSES: 1. Aspirin 81 mg, take 1 tablet by mouth once daily. 2. Maxzide 37.5/25 mg, take 1 tablet by mouth once a day. 3. Ferrous sulfate 325 mg, take 1 tablet by mouth once a day. 4. Tylenol 650 mg, take 1 tablet q.6 h. p.r.n. for pain. 5. Omeprazole 20 mg, take 1 capsule by mouth in the morning.  DISPOSITION AND FOLLOWUP:  The patient was discharged home in stable and improved condition from [name] on October 01, 2010.  The patient has been scheduled a followup appointment with [name] on October 17, 2010, at 8:15 a.m.  At that time, it needs to be discussed with the patient about restarting her back on Coumadin.  The patient has a history of DVTs and PE, and she was supposed to be on Coumadin.  It needs to be discussed with the patient and may be [name] if she needs to be on lifelong Coumadin.  PROCEDURES PERFORMED:  Chest x-ray on September 30, 2010, which showed mild cardiomegaly.  Central pulmonary vascular prominence.  CONSULTATIONS:  None.  ADMITTING HISTORY AND PHYSICAL:  The patient is a 50 year old  woman with past medical history significant for DVT and PEs who presented to  [name] for evaluation of worsening neck and back pain that started on the day of admission, which is associated with left shoulder and arm numbness, and chest pain.  Neck pain was described as being throbbing in nature, localized to the left neck without deafness.  Afterwards, the patient had onset of left shoulder.  Pain was described as numbness which originated from the left shoulder and extends to the left wrist.  The shoulder pain has been ongoing for weeks, but acutely intensified prior to admission.  The patient also describes chest pain located under the left breast described as sharp, constant, and not associated with palpitations, diaphoresis, or shortness of breath.  The patient also complains of upper back pain.  The constellation of pain grounded the patient's admission today.  She reports that current symptoms are not like Pulmonary embolism episodes.  Denies fever, chills, coughing, blurry vision,  slurredspeech, focal weakness, nausea, vomiting, or diarrhea.  No history of recent trauma or car accidents.  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  VITALS:  Temperature 97.9, pulse 75, blood pressure 128/84, respiratory rate 20, O2 sats 98% on room air. PHYSICAL EXAMINATION: GENERAL:  Alert, well developed, in no acute distress, cooperative. HEAD:  Normocephalic and atraumatic. EYES:  Pupils equal, round, and reactive to light.  Extraocular movements intact.  Vision grossly intact. MOUTH:  Mucous membranes moist and pink.  No erythema, no exudates or lesions. NECK:  Supple.  Full range of motion.  Trachea midline.  No palpable masses. LUNGS:  Clear to auscultation bilaterally.  Normal respiratory effort. HEART:  Regular rate and rhythm.  No murmurs, rubs, or gallops. ABDOMEN:  Soft and nontender.  Bowel sounds present and normoactive.  No palpable  masses. MUSCULOSKELETAL:  Hypertonicity of left trapezius muscle. EXTREMITIES:  Tender to palpation over by bicipital group.  Pain on the left shoulder raising arm above head.  No edema, cyanosis, or clubbing of all extremities. NEUROLOGIC:  Cranial nerves II-XII intact.  Strength 5/5 bilaterally in all four extremities.  Sensation is intact to light touch.  ADMISSION LABORATORIES:  White cell count of 6.2, hemoglobin 11.8, hematocrit 33.9, platelets of 297.  Her sodium was 139, potassium 3.6, chloride 102, glucose 85, BUN 10, creatinine 1.  HOSPITAL COURSE BY PROBLEM: 1. Atypical chest pain.  Differential for her chest pain included PE,     acute coronary syndrome, gastritis, but given the location of her     chest pain, duration as well as tenderness being reproducible on     physical exam, this was most likely musculoskeletal in     origin.  The patient was ruled out for acute coronary syndrome with     three sets of cardiac enzymes (negative).  Her EKG did     not show any new ST-T changes at that time. Pulmonary Embolism was      also ruled out with negative D-dimer ( 0.22). 3. Nausea.  The patient complained of some nausea during her     admission, which was thought likely to be secondary to gastritis.     The patient was started back on omeprazole for her gastritis. 4. History of PE and DVT.  The patient has a strong history of     previous episodes of PE and DVT in 2004.  The patient needs to be     on lifelong Coumadin, but the patient is off Coumadin since     November 2011.  We discussed with the patient that she needs to be     on lifelong Coumadin, and she understood and verbalizes this, but she would also like to discuss with Dr.      Alexandria [name] and would like her PCP to address this problem again with her next     visit in the clinic.  The patient was discharged home on aspirin 81 mg.       Hypercoagulable panel was also drawn, but that was pending at the      time of  discharge.  DISCHARGE VITALS:  Temperature 96.9, pulse 79, respiratory rate 20, blood pressure 115/81, O2 sats 97% on room.  DISCHARGE LABORATORIES:  Hemoglobin 11.9, hematocrit 34.5, white cell count 5.7, platelets 285.  Her A1c was 5.6.  Her TSH was 0.705.    ______________________________ Elyse Jarvis, MD   ______________________________ Johny Sax, M.D.    MS/MEDQ  D:  10/02/2010  T:  10/03/2010  Job:  [id number]  Electronically Signed by Elyse Jarvis MD on 10/19/2010 10:43:38 AM Electronically Signed by Johny Sax M.D. on 10/24/2010  07:32:31 PM

## 2010-10-30 ENCOUNTER — Encounter: Payer: Self-pay | Admitting: Internal Medicine

## 2010-11-02 LAB — PROTIME-INR
INR: 2.05 — ABNORMAL HIGH (ref 0.00–1.49)
INR: 2.39 — ABNORMAL HIGH (ref 0.00–1.49)
Prothrombin Time: 23.3 seconds — ABNORMAL HIGH (ref 11.6–15.2)
Prothrombin Time: 26.2 seconds — ABNORMAL HIGH (ref 11.6–15.2)

## 2010-11-03 LAB — CBC
HCT: 34 % — ABNORMAL LOW (ref 36.0–46.0)
HCT: 34.5 % — ABNORMAL LOW (ref 36.0–46.0)
HCT: 36.9 % (ref 36.0–46.0)
HCT: 37.2 % (ref 36.0–46.0)
Hemoglobin: 11.5 g/dL — ABNORMAL LOW (ref 12.0–15.0)
Hemoglobin: 11.8 g/dL — ABNORMAL LOW (ref 12.0–15.0)
Hemoglobin: 12.6 g/dL (ref 12.0–15.0)
Hemoglobin: 12.6 g/dL (ref 12.0–15.0)
MCH: 30.8 pg (ref 26.0–34.0)
MCH: 30.8 pg (ref 26.0–34.0)
MCH: 31.1 pg (ref 26.0–34.0)
MCH: 31.2 pg (ref 26.0–34.0)
MCHC: 33.7 g/dL (ref 30.0–36.0)
MCHC: 33.9 g/dL (ref 30.0–36.0)
MCHC: 34.1 g/dL (ref 30.0–36.0)
MCHC: 34.2 g/dL (ref 30.0–36.0)
MCV: 90.9 fL (ref 78.0–100.0)
MCV: 91.2 fL (ref 78.0–100.0)
MCV: 91.3 fL (ref 78.0–100.0)
MCV: 91.3 fL (ref 78.0–100.0)
Platelets: 295 10*3/uL (ref 150–400)
Platelets: 317 10*3/uL (ref 150–400)
Platelets: 367 10*3/uL (ref 150–400)
Platelets: 390 10*3/uL (ref 150–400)
RBC: 3.72 MIL/uL — ABNORMAL LOW (ref 3.87–5.11)
RBC: 3.77 MIL/uL — ABNORMAL LOW (ref 3.87–5.11)
RBC: 4.04 MIL/uL (ref 3.87–5.11)
RBC: 4.1 MIL/uL (ref 3.87–5.11)
RDW: 13 % (ref 11.5–15.5)
RDW: 13 % (ref 11.5–15.5)
RDW: 13.3 % (ref 11.5–15.5)
RDW: 13.5 % (ref 11.5–15.5)
WBC: 12.4 10*3/uL — ABNORMAL HIGH (ref 4.0–10.5)
WBC: 5.2 10*3/uL (ref 4.0–10.5)
WBC: 6.6 10*3/uL (ref 4.0–10.5)
WBC: 7.1 10*3/uL (ref 4.0–10.5)

## 2010-11-03 LAB — BASIC METABOLIC PANEL
BUN: 10 mg/dL (ref 6–23)
CO2: 28 mEq/L (ref 19–32)
Calcium: 9.4 mg/dL (ref 8.4–10.5)
Chloride: 100 mEq/L (ref 96–112)
Creatinine, Ser: 0.87 mg/dL (ref 0.4–1.2)
GFR calc Af Amer: >60 mL/min (ref >60)
GFR calc non Af Amer: >60 mL/min (ref >60)
Glucose, Bld: 87 mg/dL (ref 70–99)
Potassium: 3.4 mEq/L — ABNORMAL LOW (ref 3.5–5.1)
Sodium: 135 mEq/L (ref 135–145)

## 2010-11-03 LAB — SURGICAL PCR SCREEN
MRSA, PCR: NEGATIVE
Staphylococcus aureus: NEGATIVE

## 2010-11-03 LAB — TYPE AND SCREEN
ABO/RH(D): B POS
Antibody Screen: NEGATIVE

## 2010-11-03 LAB — WOUND CULTURE: Culture: NO GROWTH

## 2010-11-03 LAB — PROTIME-INR
INR: 1.07 (ref 0.00–1.49)
INR: 1.15 (ref 0.00–1.49)
INR: 1.4 (ref 0.00–1.49)
INR: 1.51 — ABNORMAL HIGH (ref 0.00–1.49)
INR: 1.54 — ABNORMAL HIGH (ref 0.00–1.49)
INR: 1.55 — ABNORMAL HIGH (ref 0.00–1.49)
INR: 1.77 — ABNORMAL HIGH (ref 0.00–1.49)
Prothrombin Time: 14.1 seconds (ref 11.6–15.2)
Prothrombin Time: 14.9 seconds (ref 11.6–15.2)
Prothrombin Time: 17.4 seconds — ABNORMAL HIGH (ref 11.6–15.2)
Prothrombin Time: 18.4 seconds — ABNORMAL HIGH (ref 11.6–15.2)
Prothrombin Time: 18.7 seconds — ABNORMAL HIGH (ref 11.6–15.2)
Prothrombin Time: 18.8 seconds — ABNORMAL HIGH (ref 11.6–15.2)
Prothrombin Time: 20.8 seconds — ABNORMAL HIGH (ref 11.6–15.2)

## 2010-11-03 LAB — APTT
aPTT: 54 seconds — ABNORMAL HIGH (ref 24–37)
aPTT: 75 seconds — ABNORMAL HIGH (ref 24–37)
aPTT: 81 seconds — ABNORMAL HIGH (ref 24–37)

## 2010-11-03 LAB — ABO/RH: ABO/RH(D): B POS

## 2010-11-03 LAB — PREGNANCY, URINE: Preg Test, Ur: NEGATIVE

## 2010-12-05 LAB — COMPREHENSIVE METABOLIC PANEL
ALT: 42 U/L — ABNORMAL HIGH (ref 0–35)
AST: 34 U/L (ref 0–37)
Albumin: 3.5 g/dL (ref 3.5–5.2)
Alkaline Phosphatase: 56 U/L (ref 39–117)
BUN: 9 mg/dL (ref 6–23)
CO2: 23 mEq/L (ref 19–32)
Calcium: 9.3 mg/dL (ref 8.4–10.5)
Chloride: 107 mEq/L (ref 96–112)
Creatinine, Ser: 0.94 mg/dL (ref 0.4–1.2)
GFR calc Af Amer: >60 mL/min (ref >60)
GFR calc non Af Amer: >60 mL/min (ref >60)
Glucose, Bld: 98 mg/dL (ref 70–99)
Potassium: 3.7 mEq/L (ref 3.5–5.1)
Sodium: 138 mEq/L (ref 135–145)
Total Bilirubin: 1 mg/dL (ref 0.3–1.2)
Total Protein: 6.6 g/dL (ref 6.0–8.3)

## 2010-12-05 LAB — BASIC METABOLIC PANEL
BUN: 6 mg/dL (ref 6–23)
CO2: 22 mEq/L (ref 19–32)
Calcium: 8.4 mg/dL (ref 8.4–10.5)
Chloride: 108 mEq/L (ref 96–112)
Creatinine, Ser: 1.09 mg/dL (ref 0.4–1.2)
GFR calc Af Amer: >60 mL/min (ref >60)
GFR calc non Af Amer: 53 mL/min — ABNORMAL LOW (ref >60)
Glucose, Bld: 83 mg/dL (ref 70–99)
Potassium: 4.1 mEq/L (ref 3.5–5.1)
Sodium: 137 mEq/L (ref 135–145)

## 2010-12-05 LAB — CBC
HCT: 33 % — ABNORMAL LOW (ref 36.0–46.0)
HCT: 34.4 % — ABNORMAL LOW (ref 36.0–46.0)
HCT: 35 % — ABNORMAL LOW (ref 36.0–46.0)
Hemoglobin: 11.3 g/dL — ABNORMAL LOW (ref 12.0–15.0)
Hemoglobin: 11.6 g/dL — ABNORMAL LOW (ref 12.0–15.0)
Hemoglobin: 11.6 g/dL — ABNORMAL LOW (ref 12.0–15.0)
MCHC: 33 g/dL (ref 30.0–36.0)
MCHC: 33.7 g/dL (ref 30.0–36.0)
MCHC: 34.1 g/dL (ref 30.0–36.0)
MCV: 85 fL (ref 78.0–100.0)
MCV: 85.5 fL (ref 78.0–100.0)
MCV: 85.6 fL (ref 78.0–100.0)
Platelets: 263 10*3/uL (ref 150–400)
Platelets: 281 10*3/uL (ref 150–400)
Platelets: 296 10*3/uL (ref 150–400)
RBC: 3.87 MIL/uL (ref 3.87–5.11)
RBC: 4.01 MIL/uL (ref 3.87–5.11)
RBC: 4.12 MIL/uL (ref 3.87–5.11)
RDW: 15.3 % (ref 11.5–15.5)
RDW: 15.5 % (ref 11.5–15.5)
RDW: 15.5 % (ref 11.5–15.5)
WBC: 4.4 10*3/uL (ref 4.0–10.5)
WBC: 5.1 10*3/uL (ref 4.0–10.5)
WBC: 5.4 10*3/uL (ref 4.0–10.5)

## 2010-12-05 LAB — LIPID PANEL
Cholesterol: 166 mg/dL (ref 0–200)
HDL: 37 mg/dL — ABNORMAL LOW (ref >39)
LDL Cholesterol: 116 mg/dL — ABNORMAL HIGH (ref 0–99)
Total CHOL/HDL Ratio: 4.5 RATIO
Triglycerides: 67 mg/dL (ref <150)
VLDL: 13 mg/dL (ref 0–40)

## 2010-12-05 LAB — FOLATE: Folate: 7.7 ng/mL

## 2010-12-05 LAB — URINALYSIS, ROUTINE W REFLEX MICROSCOPIC
Bilirubin Urine: NEGATIVE
Glucose, UA: NEGATIVE mg/dL
Hgb urine dipstick: NEGATIVE
Ketones, ur: NEGATIVE mg/dL
Nitrite: NEGATIVE
Protein, ur: NEGATIVE mg/dL
Specific Gravity, Urine: 1.013 (ref 1.005–1.030)
Urobilinogen, UA: 1 mg/dL (ref 0.0–1.0)
pH: 7.5 (ref 5.0–8.0)

## 2010-12-05 LAB — RETICULOCYTES
RBC.: 4.01 MIL/uL (ref 3.87–5.11)
Retic Count, Absolute: 48.1 10*3/uL (ref 19.0–186.0)
Retic Ct Pct: 1.2 % (ref 0.4–3.1)

## 2010-12-05 LAB — DIFFERENTIAL
Basophils Absolute: 0 10*3/uL (ref 0.0–0.1)
Basophils Relative: 0 % (ref 0–1)
Eosinophils Absolute: 0 10*3/uL (ref 0.0–0.7)
Eosinophils Relative: 1 % (ref 0–5)
Lymphocytes Relative: 42 % (ref 12–46)
Lymphs Abs: 2.3 10*3/uL (ref 0.7–4.0)
Monocytes Absolute: 0.4 10*3/uL (ref 0.1–1.0)
Monocytes Relative: 7 % (ref 3–12)
Neutro Abs: 2.7 10*3/uL (ref 1.7–7.7)
Neutrophils Relative %: 50 % (ref 43–77)

## 2010-12-05 LAB — PROTIME-INR
INR: 1.2 (ref 0.00–1.49)
INR: 1.3 (ref 0.00–1.49)
INR: 1.5 (ref 0.00–1.49)
Prothrombin Time: 15.5 seconds — ABNORMAL HIGH (ref 11.6–15.2)
Prothrombin Time: 16.9 seconds — ABNORMAL HIGH (ref 11.6–15.2)
Prothrombin Time: 18.4 seconds — ABNORMAL HIGH (ref 11.6–15.2)

## 2010-12-05 LAB — URINE CULTURE: Colony Count: 7000

## 2010-12-05 LAB — CARDIAC PANEL(CRET KIN+CKTOT+MB+TROPI)
CK, MB: 0.9 ng/mL (ref 0.3–4.0)
CK, MB: 1.1 ng/mL (ref 0.3–4.0)
CK, MB: 1.2 ng/mL (ref 0.3–4.0)
Relative Index: 0.8 (ref 0.0–2.5)
Relative Index: 1 (ref 0.0–2.5)
Relative Index: 1 (ref 0.0–2.5)
Total CK: 111 U/L (ref 7–177)
Total CK: 113 U/L (ref 7–177)
Total CK: 122 U/L (ref 7–177)
Troponin I: <0.01 ng/mL (ref 0.00–0.06)
Troponin I: <0.01 ng/mL (ref 0.00–0.06)
Troponin I: <0.01 ng/mL (ref 0.00–0.06)

## 2010-12-05 LAB — IRON AND TIBC
Iron: 92 ug/dL (ref 42–135)
Saturation Ratios: 34 % (ref 20–55)
TIBC: 274 ug/dL (ref 250–470)
UIBC: 182 ug/dL

## 2010-12-05 LAB — VITAMIN B12: Vitamin B-12: 282 pg/mL (ref 211–911)

## 2010-12-05 LAB — HEMOCCULT GUIAC POC 1CARD (OFFICE): Fecal Occult Bld: NEGATIVE

## 2010-12-05 LAB — H. PYLORI ANTIBODY, IGG: H Pylori IgG: 6.3 {ISR} — ABNORMAL HIGH

## 2010-12-05 LAB — D-DIMER, QUANTITATIVE: D-Dimer, Quant: <0.22 ug/mL-FEU (ref 0.00–0.48)

## 2010-12-05 LAB — LIPASE, BLOOD: Lipase: 29 U/L (ref 11–59)

## 2010-12-05 LAB — FERRITIN: Ferritin: 18 ng/mL (ref 10–291)

## 2010-12-22 ENCOUNTER — Encounter: Payer: Self-pay | Admitting: Internal Medicine

## 2011-01-02 NOTE — Consult Note (Signed)
NAMEMCKINZI, ERIKSEN             ACCOUNT NO.:  [id number]   MEDICAL RECORD NO.:  [id number]          PATIENT TYPE:  INP   LOCATION:  0105                         FACILITY:  [facility]   PHYSICIAN:  Noralyn Pick. Eden Emms, MD, FACCDATE OF BIRTH:  Oct 07, 1960   DATE OF CONSULTATION:  03/01/2008  DATE OF DISCHARGE:                                 CONSULTATION   A 47-year patient of Dr. Juanito Doom seen in the ER at [facility] for  chest pain.  The chest pain is approximately 46 hours old. This is  atypical.  It is sharp, it is in the center of her chest.  It is  associated with some shortness of breath.   There is no pleuritic component.  No cough or fever.   The patient does have a history of pulmonary emboli and two previous  DVTs.  She is on chronic Coumadin therapy that is followed by Dr. Shaune Leeks at [facility].   The patient had a chest x-ray that showed no acute disease.  Her CT scan  done in the ER showed no pulmonary emboli.   The patient has not had relief with analgesics.   The pain is in the center of her chest.  It can radiate up into both  shoulders and feels like a tightness.   There is no associated diaphoresis or pre-syncope.  There is a question  of a previous history of SVT.  She does note occasional palpitations,  however, while I was examining her, she said she felt palpitation there  monitor showed normal sinus rhythm.   IDENTIFICATION:  The patient's review of systems is otherwise negative.   PAST MEDICAL HISTORY:  1. Is remarkable for a cyst in left breast removed by Dr. Zachery Dakins.      She did not have breast cancer.  Unfortunately, it appears that her      CT scan that was done in the ER showed an enlarging right breast      nodule that needs further workup and mammogram.  2. The patient has had a history of cholecystectomy.  3. There is a question of fibroid tumors.  4. Anemia.  5. She has had two previous DVTs and PE.  6. She has had lower extremity edema.   SHE  IS ALLERGIC TO DILAUDID.   She was on Coumadin 15 mg a day, iron and triamterene  hydrochlorothiazide 37.5/25.   FAMILY HISTORY:  Is remarkable for premature coronary disease on her  father's side.   The patient has two children.  She is divorced.  She works as a Engineer, site at [employer].  She is otherwise sedentary and overweight.  She  does not smoke or drink.   PHYSICAL EXAMINATION:  Is remarkable for morbidly obese black female in  no distress.  Affect is appropriate, blood pressure is 130/70, pulse is  70 and regular, respiratory rate 14, afebrile.  HEENT:  Unremarkable.  Carotids are normal without bruit.  No  lymphadenopathy, thyromegaly, JVP elevation.  LUNGS:  Clear with good diaphragmatic motion.  No wheezing.  S1-S2 with distant heart sounds.  PMI  is not palpable.  ABDOMEN:  Protuberant, status post cholecystectomy.  Bowel sounds  positive.  No AAA.  No tenderness, no bruit.  No hepatosplenomegaly or  hepatojugular reflux.  Distal pulses are intact.  Trace edema.  NEURO:  Nonfocal.  SKIN:  Warm and dry.  No muscular weakness.  There is not an EKG in the  chart.  ER note indicates no acute changes.   I pulled up her EKG from use and it was normal with a ventricular rate  of 68, PR interval of 162, QRS 78, QT 434.  A chest x-ray shows no  active disease.  CT scan was as indicated with no PE and enlarging right  breast nodule.   LAB WORK:  Is remarkable for an anemia with a hematocrit of 31 and  potassium 3.3.  Other labs unremarkable.   IMPRESSION:  1. Atypical chest pain.  Admit for 24 observation. Check 2-D      echocardiogram in the morning.  If no regional wall motion      abnormalities can be discharged to follow-up with Dr. Daleen Squibb for      possible outpatient Myoview.  2. 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  Followup with Shaune Leeks      for pro times.  3. Anemia.  Continue iron  replacement.  Followup with primary care MD.  4. Lower extremity edema.  Continue triamterene hydrochlorothiazide,      decrease salt intake, weight loss.  5. History of left breast cyst with enlarging nodule in the right      breast.  Needs followup MRI or mammogram. I specifically told this      to the patient to make sure it did not fall through the cracks.      Noralyn Pick. Eden Emms, MD, [facility]  Electronically Signed     PCN/MEDQ  D:  03/01/2008  T:  03/01/2008  Job:  [id number]

## 2011-01-02 NOTE — Discharge Summary (Signed)
[name], [name]             ACCOUNT NO.:  [id number]   MEDICAL RECORD NO.:  [id number]          PATIENT TYPE:  INP   LOCATION:  2037                         FACILITY:  [facility]   PHYSICIAN:  Judie Grieve, MD  DATE OF BIRTH:  [birth year]   DATE OF ADMISSION:  02/13/2007  DATE OF DISCHARGE:  02/15/2007                               DISCHARGE SUMMARY   ATTENDING PHYSICIAN:  Eliseo Gum, M.D.   DISCHARGE DIAGNOSES:  1. Palpitations, etiology unclear, resolved during the      hospitalization.  2. 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  Restarted back on Coumadin therapy  3. History of atrial fibrillation with rapid ventricular response,      onset in September, 2003, self-limited.  4. Hypertension.  5. Obesity.  6. Iron deficiency anemia with history of menorrhagia from fibroids,      hemoglobin 11.7 on the day of discharge.  7. 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 Zachery Dakins in August, 2007.  8. Status post cholecystectomy.  9. Family history significant for colon carcinoma.  Mother passed in      59s with colon carcinoma.  Grandmother also had a history.  10.Hypokalemia, secondary to Maxzide.  11.Obesity, body mass index 39.  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   DISCHARGE MEDICATIONS:  1. Coumadin 10 mg p.o. daily for goal INR 2-3.  2. Ferrous sulfate 325 mg p.o. b.i.d.  3. Maxzide 37.5/25 mg p.o. daily.  4. Potassium chloride 20 mEq p.o. daily.   FOLLOWUP:  1. Ms. Strebel has been discharged home in fair condition, to follow up      at outpatient clinic, [name], on      Thursday, that is February 20, 2007.  On the same day, she will get her      PT/INR and also Coumadin clinic followup with Dr. Michaell Cowing to adjust      her Coumadin level.  2. She also needs  to follow up with cardiology, Dr. Algie Coffer, on February 25, 2007, at 3 p.m. for a possible stress test given her recent      history of palpitations and significant cardiac risk factors.  3. She also will follow up with a gynecologist for discussion on      further treatment of her fibroids, possible hysterectomy.  4. At the time of her followup, need to check her CBC to make sure      hemoglobin is stable.  Also need to check on her clinical status,      especially palpitations, and make sure she follows up with      cardiology.  5. Need to check hypercoagulable panel during her clinic followup.  6. Need to check a BMET to follow up on potassium and consider      discontinuing K-Dur replacement.  7. In case her symptoms were to recur,  she needs to contact us as soon      as possible and that she might need an event monitor in the future      to monitor her rhythm at  the time of symptoms.   CONSULTATIONS:  We called cardiology, Dr. Algie Coffer, who suggested  outpatient management with stress test and, as mentioned above, the  patient will follow on February 25, 2007, at 3 p.m. in their office.   PROCEDURES:  1. She had a CT angio on March 15, 2007, which was negative for PE.  2. 2D echocardiogram was done on February 14, 2007, which showed overall      left ventricular systolic function normal, left ventricular      ejection fraction estimated 65%, left ventricular wall thickness      mildly increased.   HISTORY OF PRESENT ILLNESS:  Ms. [name] is a 50 year old obese  African-American woman with a past medical history significant for  multiple DVTs and PEs, last one in 2004, off of Coumadin since one year  prior to admission, hypertension, iron deficiency, history of atrial  fibrillation with RVR in September, 2003, self-limited, anemia,  secondary to iron deficiency from menorrhagia, secondary to her  fibroids, presented to the [facility] with a two-week  history of  experiencing bouts of palpitations described as harder and  stronger beats with skipped beats sensation.  Occurs one to two times  per day, but a bit more frequently on the day of admission, accompanied  with dyspnea, sharp pinching, nonradiating substernal chest pain 10/10,  lasting for seconds, sometimes associated with dizziness and presyncope.  She said she felt this way when she had atrial fibrillation when she had  PEs in the past.  Family history is also significant for first- and  second-degree premature coronary artery disease dad, but no sudden  deaths.  She was also off of warfarin since one year, which was  concerning for PE at the time of admission, so she was admitted for  further evaluation.   PHYSICAL EXAMINATION:  VITAL SIGNS:  Temperature 37.0, blood pressure  136/88, pulse 81, respiratory rate 18, O2 sat 100% on room air.  Weight  127 kg.  GENERAL EXAMINATION:  No acute distress, obese.  EYES:  Pupils equal, reactive to light.  Extraocular muscles are intact.  Anicteric.  ENT:  Oropharynx clear, moist mucous membranes.  NECK:  Supple, no lymphadenopathy.  No JVD.  No carotid bruit.  No  thyromegaly.  RESPIRATORY SYSTEM:  Clear to auscultation with superficial breath.  CVS:  Regular rate and rhythm.  No murmurs, gallops or rubs.  GI:  Soft, nontender, nondistended.  Positive bowel sounds.  No  pulsatile masses or organomegaly.  EXTREMITIES:  No cyanosis, clubbing or edema.  Macular-papular violet  plaques on left shin since warfarin.  GU:  Deferred.  SKIN:  Warm and dry, no rash.  LYMPHADENOPATHY:  None appreciated.  MUSCULOSKELETAL SYSTEM:  Within normal limits.  NEUROLOGICAL EXAMINATION:  Alert, awake, oriented x3.  Cranial nerves II-  XII intact, moving four extremities, Babinski's negative.  Strength 5/5,  symmetrically.  PSYCH:  Appropriate.   ADMISSION LABS:  ABG 7.429, pCO2 29.1, pO2 88.5, bicarb 19.8 on room air.  CBC with hemoglobin 11.3, baseline 9.4  in February, 2008.  White  count 7.2, platelets 371, MCV 83.0, ANC 3.8.  BMET:  Sodium 138,  potassium 3.2, chloride 107, bicarb 21, BUN 15, creatinine 0.96 and  glucose 155.  RDW 16.0.  Anion gap 10, bilirubin 0.4, alk phos 54, AST  20, ALT 32, protein 7.2, albumin 3.6, calcium 9.3.  Cardiac enzymes x3:  Negative.  D-dimer:  0.36, PT 13.2, INR 1.2, PTT 30.   HOSPITAL COURSE:  1. Paroxysmal palpitations with associated dyspnea and substernal      intermittent chest pain.  With her history of atrial fibrillation      at the time of admission, differential diagnosis was broad.  PVCs      and extrasystoles that she had were consistent with skipped beats.      We did check a 2D echocardiogram, which was negative for valvular      abnormalities except for mild pulmonic regurgitation.  No signs of      mitral valve prolapse and no murmur on exam.    We did check a TSH level, which was within normal limits at 1.49, so  unlikely that her symptoms were secondary to that.  There was also a  question of anxiety versus panic disorder, but the patient denied any  significant stress or change in the life situation recently.  Given her multiple cardiovascular risk factors, including hypertension,  obesity, premature family history of coronary artery disease, we were  concerned about acute coronary syndrome, and she had an EKG which was  negative for signs of ischemia.  Cardiac enzymes were cycled, which were  negative as well.  On telemetry except for occasional skipped beats, her  rhythm remained pretty stable.  She denied having any episodes of  arrhythmia or atrial fibrillation throughout her hospitalization and her  symptoms also resolved on the day of discharge.  So the decision was  made to consult a cardiologist, who also suggested outpatient management  with a stress test, and an appointment was made for February 25, 2007, at 3  p.m. with Dr. Algie Coffer for further followup.  She was also intimated  that  in case her symptoms were to recur, she needs to contact us as soon as  possible and that she might need an event monitor in the future to  monitor her rhythm at the time of symptoms.   1. Past history of multiple DVTs and PEs and paroxysmal atrial      fibrillation in 2003, now with history of vague chest pain and      transient shortness of breath, which was concerning for a PE at the      time of admission, especially given the fact that she was off of      the Coumadin for a year prior to the day of admission. This      decision of discontinuing coumdin was made partly per patients      choice , noncompliance issues and Dr. Alexandria Lodge also suggested this      since she has been on coumadin for >2 years from her last PE in      2004.    Her CT angiogram was checked, which was negative for PE.  But given  her hypercoagulable status and history of multiple PEs and DVTs, we have decided to restart her back on Coumadin.  She was started on 10 mg of  Coumadin and she will follow with the Coumadin clinic, Dr. Michaell Cowing, for  further management of this.  There is no documented evidence of workup  for hypercoagulable status, so we recommend workup for hypercoagulable  status as an outpatient during her hospital visit.   1. History of anemia with, most likely, iron  deficiency given her iron      studies with iron of 37, TIBC 343, percent saturation 11%      (decreased at 11%), ferritin decreased at 14.  This is most likely      secondary to menorrhagia from fibroids, and she will continue iron      sulfate p.o. b.i.d. and needs to follow with her gynecologist      further for discussion regarding possible hysterectomy.   1. Hypertension, well controlled.  She was continued on Maxzide      37.5/25 mg p.o. daily.  2. Hypokalemia, secondary to diuretic.  She was placed on replacement      therapy with K-Dur 20 mEq p.o. daily at the time of discharge.  Her      BMET needs to be checked at the  time of followup, and if it remains      stable, the potassium chloride can be discontinued.  3. Obesity, with BMI of 39.  Need to further assess lifestyle      modification and continue followup during the outpatient visits.  4. History of first-degree family member with colon carcinoma.  Mother      passed in 80s with colon carcinoma.  Her last colonoscopy was in      2007, showing internal hemorrhoids.  Need a repeat colonoscopy as      an outpatient as soon as possible, given significant family      history.  5. History of paroxysmal atrial fibrillation 2003, self limited      episode, currently normal sinus rhythm.  Again, anticoagulation has      been reinitiated.   DISCHARGE VITAL SIGNS:  Temperature 97.3, blood pressure 113-130,  diastolic blood pressure 77-86, pulse 75, respiratory rate 18.   LABS:  Hemoglobin 11.7, white count 5.4.  BMET:  Sodium 135, potassium  4.1 from 3.6, chloride 107, bicarb 22, BUN 13, creatinine 1.86.  USD  negative.  UA:  Negative.  HCG:  Negative.  Microalbumin/creatinine  ratio:  7.2.      Judie Grieve, MD  Electronically Signed     SY/MEDQ  D:  02/17/2007  T:  02/17/2007  Job:  [id number]

## 2011-01-02 NOTE — Discharge Summary (Signed)
[name], [name]             ACCOUNT NO.:  [id number]   MEDICAL RECORD NO.:  [id number]          PATIENT TYPE:  INP   LOCATION:  1406                         FACILITY:  [facility]   PHYSICIAN:  Madolyn Frieze. Jens Som, MD, FACCDATE OF BIRTH:  14-Jun-1961   DATE OF ADMISSION:  03/01/2008  DATE OF DISCHARGE:  03/02/2008                               DISCHARGE SUMMARY   DISPOSITION:  [name] is being discharged home after being seen by  Dr. Jens Som.   FOLLOW UP:  1. She is to follow up with Dr. Juanito Doom, her primary cardiologist.  2. She will also follow up with Dr. Zara Council at the [facility].   DISCHARGE DIAGNOSES:  1. Chest pain felt to be atypical, negative cardiac enzymes.  EKG      without acute changes.  2. Abnormal CT of the chest.  The patient found to have enlarging      right breast nodule by CT scan.  3. Anemia.  4. History of deep venous thrombosis/pulmonary emboli.  5. Chronic anticoagulation therapy with Coumadin with history of      noncompliance secondary to heavy menorrhagia.  6. Questionable obstructive sleep apnea.  7. Obesity.  8. Poor insight to health.  9. Pulmonary arterial hypertension noted on CT of the chest.  10.Small hiatal hernia noted on CT of the chest.  11.A 2-D echocardiogram obtained this admission.  Preliminary report      read by Dr. Myrtis Ser showing technically difficult study.  EF appears      normal with no left wall motion abnormality noted.  12.Hypokalemia as she was found to have a potassium of 3.3 this      admission.  13.Hypertension.   HOSPITAL COURSE:  [name] is a very pleasant 50 year old African  American female with past medical history as stated above who had a  brief hospitalization, but multiple issues to address.  She presented to  [facility] Room complaining of chest discomfort with a past  medical history noted for DVTs and pulmonary emboli.  There is also a  question whether or not  she has a history of paroxysmal atrial fib per  patient.  [name] was admitted.  She was found to have a  subtherapeutic INR of 1.2.  [name] did confirm that she does miss  doses of her Coumadin secondary to heavy menstrual bleeding and she felt  the Coumadin made this worse.  The patient with poor understanding of  her multiple risk factors for CVA.  She is followed in the [facility], but states she has not been very diligent in  preventative medicine care.  She does get her INR levels checked by Dr.  Shaune Leeks, pharmacist at the clinic.  [name] underwent CT of the  chest as stated above and a 2-D echocardiogram.  She also had blood work  that ruled out acute myocardial infarction.  CT of the chest of course  negative for pulmonary emboli.  EKG without acute changes.   On day of discharge, Dr. Olga Millers  in to examine and assess the  patient.  Blood pressure is stable at 122/83, 100% on room air.  Patient  being discharged home to follow up with Dr. Daleen Squibb.   DISCHARGE MEDICATIONS:  1. I have given the patient 40 mEq of KCl prior to discharge and      written her a prescription for potassium 20 mEq daily.  2. I have given her a prescription for nitroglycerin p.r.n.  3. I have given her 15 tablets of Ambien 5 mg p.r.n. for sleep.  4. She is to continue her Coumadin at 15 mg daily or as prescribed by      Dr. Michaell Cowing at her next lab check.  INR will be March 16, 2008 at 8      a.m.  5. Triamterine/HCTZ 37.5/25 mg daily.  6. Ferrous sulfate 325 mg daily.   I have spent greater than 45 minutes addressing the patient's concerns  and scheduling her for follow up outpatient.  I have got her set up for  a stress test at our office on March 16, 2008 at 8 a.m. for the stress  test.  She will need blood work including a BMET checked at that time.  She has a follow up with Dr. Daleen Squibb March 19, 2008 at 4:30.  She is  scheduled for a diagnostic mammogram at the [facility] March 05, 2008 at 10:20 a.m.  Then she has a follow up appointment  with Dr. Polly Cobia at the  [facility] on March 08, 2008.  [name] states her next Coumadin check is this coming Thursday, not  Monday, March 16, 2008.   I have given her a copy of her PT/INR that was obtained here this  admission.  I have also given her copy of her chest CT for her own  records.  I have strongly encouraged her to follow up with a primary  care physician for continuity of care.  She has multiple risk factors  for chronic illnesses.  I am also concerned that [name] may have  obstructive sleep apnea.  Her husband confirms that she has periods of  disruptive sleep.  This can be followed up outpatient.  Hopefully, Dr.  Daleen Squibb can arrange for a sleep study.  If not, we will ask that Dr. Polly Cobia  address this.   Duration of discharge encounter well over 1 hour.      Dorian Pod, ACNP      Madolyn Frieze. Jens Som, MD, [facility]  Electronically Signed    MB/MEDQ  D:  03/02/2008  T:  03/02/2008  Job:  [id number]

## 2011-01-02 NOTE — Discharge Summary (Signed)
[name], [name]            ACCOUNT NO.:  [id number]   MEDICAL RECORD NO.:  [id number]          PATIENT TYPE:  INP   LOCATION:  4731                         FACILITY:  [facility]   PHYSICIAN:  C. Ulyess Mort, M.D.DATE OF BIRTH:  27-Jun-1961   DATE OF ADMISSION:  10/11/2008  DATE OF DISCHARGE:  10/12/2008                               DISCHARGE SUMMARY   DISCHARGE DIAGNOSES:  1. Epigastric discomfort of unclear etiology.  2. Noncardiac chest pain.  3. History of deep venous thrombosis and pulmonary embolus on chronic      Coumadin therapy, currently with Coumadin noncompliance.  4. Hypertension.  5. Iron-deficiency anemia.  6. Obesity.  7. History of cholecystectomy.   DISCHARGE MEDICATIONS:  1. Triamterene - hydrochlorothiazide 37.5 - 25 mg p.o. once daily.  2. Ferrous sulfate 325 mg p.o. once daily.  3. Albuterol MDI 2 puffs every 6 hours as needed for shortness of      breath.  4. Coumadin 15 mg p.o. once daily.  5. Lovenox 120 mg - 0.8 mL inject 115 mg subcutaneously twice daily.  6. Omeprazole 20 mg p.o. daily.   DISPOSITION AND FOLLOWUP:  The patient is being discharged from [facility] on October 12, 2008.  She is to follow up with  Dr. Erline Levine in the [facility] on October 14, 2008, at 3:00 p.m. in the afternoon.  At which time, she will need a PT  and INR checked and also her Coumadin adjusted.  She began her Lovenox  therapy on October 11, 2008.  She will need a minimum of 5 days of  Lovenox therapy and a minimum of 2 days of overlapping Lovenox and  Coumadin once the patient's INR becomes therapeutic.  The patient will  need to follow up with Dr. Zara Council in the [facility], and the clinic was closed at the time of discharge.  The patient  is being provided with the clinic phone number and will contact the  clinic as needed to schedule an outpatient followup.   PROCEDURES PERFORMED:  1.  Acute abdominal x-ray on October 10, 2008, shows benign-appearing      abdomen and chest  2. CT of abdomen with contrast on October 10, 2008, reveals negative      CT scan of abdomen, previous cholecystectomy, bibasilar atelectasis      and/or pneumonia.  3. CT pelvis with contrast on October 10, 2008, shows massive      leiomyomatous enlargements of the uterus including a large      pedunculated lesion, projecting into the right side of the pelvis,      possible hydrosalpinx on the right.  No inflammatory changes are      evident.   CONSULTATIONS:  None.   BRIEF ADMITTING HISTORY AND PHYSICAL:  The patient is a 50 year old  African American female with history of hypertension and 3 prior  episodes of pulmonary embolus on chronic Coumadin therapy who presents  with epigastric and chest discomfort.  The patient had acute onset of  epigastric pain on the morning of admission,  which she said radiated  into her chest.  The pain is described as tingling and sharp.  The  patient denies diaphoresis, shortness of breath, syncope,  lightheadedness, or radiation of the pain.  The patient states that the  pain was constant, only relieved by Dilaudid in the [facility] ED.  The  patient does endorse a feeling of clamminess with some occasional nausea  but no fever, nausea, or vomiting or diarrhea.  The patient denies any  sick contacts.  The patient states that her current pain is different  than her 3 prior episodes of pulmonary embolus.   PHYSICAL EXAMINATION:  VITAL SIGNS:  Temperature 97.7, blood pressure  142/85, pulse 88, respiration 20, saturating at 100% on room air.  GENERAL:  The patient is in mild discomfort.  HEENT:  Eyes, pupils equal, round, and reactive to light.  Extraocular  movements intact.  ENT, oropharynx is clear.  NECK:  Supple.  CHEST:  Respirations clear to auscultation bilaterally.  Good air  movement.  No wheezes appreciated.  CARDIOVASCULAR:  Regular rate and  rhythm.  No murmurs, rubs, or gallops.  Chest wall is mildly tender to palpation anteriorly.  GASTROINTESTINAL:  Abdomen is obese, soft, nontender, nondistended with  positive bowel sounds.  EXTREMITIES:  Trace lower extremity edema bilaterally.  GENITOURINARY:  There is no CVA tenderness.  SKIN:  No lesions.  LYMPHATIC:  No lymphadenopathy.  MUSCULOSKELETAL:  No joint abnormalities.  NEUROLOGIC:  Alert and oriented x3.  Cranial nerves II through XII  grossly intact.  Nonfocal neuro exam.  PSYCH:  Anxious.   LABORATORY DATA:  Sodium 138, potassium 3.7, chloride 107, bicarb 23,  BUN 9, creatinine 0.94, glucose 98.  White blood cell count 5.4,  hemoglobin 11.6, platelets 281.  Lipase 29.  INR 1.5.   HOSPITAL COURSE:  1. Epigastric discomfort:  The patient was admitted to [facility] for evaluation of her acute-onset epigastric      discomfort, and the patient was provided initially with narcotics      in order to relieve her epigastric discomfort that was radiating      into her chest.  She also got a EKG upon reaching the floor at the      [facility], and the EKG showed no evidence of acute      ischemia.  She also had her cardiac enzymes cycled x3, and there      was no elevation in any of her biomarkers.  There was some concern      that the patient might have had recurrence of a pulmonary embolus      by primary team and discussed obtaining a CT angiogram of the      chest; however, in the setting of the patient needing chronic      Coumadin therapy, it was decided that a CT angiogram would not      change her acute management, and subsequently, the patient was      placed on therapeutic Lovenox in order to be bridged with her      Coumadin, which was nontherapeutic at the time of admission.  The      patient was also provided a GI cocktail on the first day of      admission, which did provide her with some relief.  Fecal occult      blood scan was  obtained, which was negative, and H. pylori antigen  was obtained and the results were pending at the time of discharge.      The patient had a UA and urine culture, which were negative as      well.  The patient's pain regimen was changed from IV morphine to      p.o. Percocet, and on the second of hospitalization, she described      a complete resolution of her abdominal pain and chest pain.  The      primary team felt that her chest pain was noncardiac in origin.      Her epigastric discomfort had resolved at time of discharge, and      was determined to be of unclear etiology.  2. Subtherapeutic INR:  As mentioned in #1, the primary team decided      to treat the patient with therapeutic Lovenox and that her INR was      subtherapeutic and Lovenox was started on her first day of      hospitalization and will need to be continued for a minimum of 5      days, and the patient will also need 2 days of overlapping once her      INR level becomes therapeutic.  The patient stated that she was      aware of the importance of taking her Coumadin every day and also      aware of the importance of following up with Dr. Erline Levine in the      [facility].  The patient will be      provided with a prescription for both Lovenox and Coumadin at the      time of discharge and that she stated that she has been out of her      Coumadin.  3. Hypertension:  The patient was maintained on her home dosage of      Maxzide.  Throughout her hospitalization, her blood pressures were      well controlled.   DISCHARGE VITAL SIGNS:  Temperature 97.5, blood pressure 115/81, pulse  77, respirations 18, saturating 94% on room air.   DISCHARGE LABORATORY DATA:  White blood cell count 5.1, hemoglobin 11.6,  platelets 296.  Sodium 137, potassium 4.1, chloride 108, bicarb 22, BUN  6, creatinine 1.09, glucose 83.  PT 15.5, INR 1.2.   The patient was discharged home in stable and improved  condition.      Genia Del, MD  Electronically Signed      C. Ulyess Mort, M.D.  Electronically Signed    ZF/MEDQ  D:  10/12/2008  T:  10/13/2008  Job:  [id number]   cc:   Zara Council, MD  Dr. Erline Levine

## 2011-01-05 NOTE — H&P (Signed)
[name], [name]                       ACCOUNT NO.:  [id number]   MEDICAL RECORD NO.:  [id number]                   PATIENT TYPE:  INP   LOCATION:  0101                                 FACILITY:  [facility]   PHYSICIAN:  Corinna L. Lendell Caprice, MD             DATE OF BIRTH:  Sep 06, 1960   DATE OF ADMISSION:  06/10/2003  DATE OF DISCHARGE:                                HISTORY & PHYSICAL   CHIEF COMPLAINT:  Shortness of breath and chest pain.   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  She presents to the  emergency room with a several day history of worsening dyspnea, especially  with exertion.  She feels short of breath to even talk.  She also has had  pleuritic chest pain, which is described as shock-like worsened by deep  inspiration.  The pain is located in the right breast and upper left chest  as well substernal area.  The patient states that this pain is similar to  the type of pain that she has had in the past with her previous pulmonary  emboli.  She had been on Coumadin for five or six years but stopped it on  her own.  She is taking aspirin.  She does not smoke.  She is on no birth  control pills.  She has had no recent surgery or long trips.   PAST MEDICAL HISTORY:  1. As above.  2. She has had an admission for a self limited episode of rapid atrial     fibrillation.   MEDICATIONS:  Aspirin 4 adult strength a day.   SOCIAL HISTORY:  The patient does not smoke, drink, or use drugs.  She is a  Electrical engineer.  She is married.   FAMILY HISTORY:  Her mother died in her 38s of colon cancer.  Her father  died at age 46 of a myocardial infarction.  Her sister and her aunt have  both had thrombotic events, both DVTs and pulmonary emboli.  There is also  diabetes and hypertension in the family.   GYNECOLOGIC HISTORY:  The patient has had a miscarriage, an abortion, and  one  successful pregnancy.  She has noticed heavy periods more frequently,  sometimes every two weeks recently and usually has heavy periods.  She has  not had a gynecologic examination for some time.   REVIEW OF SYSTEMS:  CONSTITUTIONAL:  No fevers, chills, or weight loss.  HEENT:  No headache or sore throat.  RESPIRATORY:  As above.  No cough.  CARDIOVASCULAR:  As above.  She denies palpitations recently.  GASTROINTESTINAL:  She had a colonoscopy several years ago for a history of  rectal bleeding and was found to have internal hemorrhoids.  GENITOURINARY:  No dysuria or hematuria.  MUSCULOSKELETAL:  No arthralgias or myalgias.  SKIN:  No rash.  PSYCHIATRIC:  No depression.  NEUROLOGIC:  No seizures.  ENDOCRINE:  No diabetes.  HEMATOLOGIC:  As above.   PHYSICAL EXAMINATION:  VITAL SIGNS:  Her oxygen saturation is 100% on room  air.  Blood pressure is 170/91 initially, now it is 137/90, temperature  initially was 99.6, pulse is 84.  GENERAL:  The patient appears to be in some discomfort.  She is overweight.  HEENT:  Normocephalic atraumatic.  Pupils equal, round, and reactive to  light.  Sclerae are nonicteric.  Moist mucous membranes.  NECK:  Supple.  No lymphadenopathy.  No thyromegaly.  LUNGS:  Clear to auscultation bilaterally without wheezes, rhonchi, or  rales.  CARDIOVASCULAR:  Regular rate and rhythm without murmurs, rubs, or gallops.  No chest wall tenderness.  ABDOMEN:  Soft, nontender, nondistended.  GENITOURINARY/RECTAL:  Deferred.  EXTREMITIES:  No clubbing, cyanosis, or edema.  No calf tenderness.  [name]'  sign negative.  Pulses are intact.  SKIN:  No rash.  PSYCHIATRIC:  Normal affect.  NEUROLOGIC:  Alert and oriented.  Cranial nerves, sensory, and motor  examination intact.   LABORATORY DATA:  Her white blood cell count is 5.9, hemoglobin 10.2,  hematocrit 30.5, MCV 80, platelet count 406.  Her complete metabolic panel  is normal.  CPK is 230.  MB fraction is 2.4.   Troponin 0.1.  EKG shows  normal sinus rhythm.  Chest x-ray is reportedly negative.  CAT scan wet  reading shows probable pulmonary emboli, multiple according to ER physician,  who spoke with the radiologist.  There were no deep venous thromboses in the  legs.   ASSESSMENT/PLAN:  1. Acute pulmonary embolus.  Although the computed tomographic scan is not     100% definitive for a pulmonary embolus, my clinical suspicion is high     given the patient's symptoms and history.  She needs to be on life long     Coumadin unless she develops complications.  I, therefore, feel that no     further workup is indicated at this time and I will start the patient on     Lovenox and Coumadin.  She will get an INR now and daily INRs.  She will     remain on bedrest and oxygen.  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  I will check an anticardiolipin antibody.  She has no     primary care physician and we will need to set up outpatient follow up.  2. Anemia possibly secondary to menorrhagia.  I will check a ferritin level.     I will also check a urine pregnancy.  She will also need annual     examination as an outpatient, as she has had no pap smear recently.                                               Corinna L. Lendell Caprice, MD    CLS/MEDQ  D:  06/10/2003  T:  06/10/2003  Job:  [id number]

## 2011-01-05 NOTE — Procedures (Signed)
[facility]  Patient:    [name], [name]                    MRN: [id number] Proc. Date: 01/03/00 Adm. Date:  [id number] Disc. Date: [id number] Attending:  Orland Mustard CC:         Anna Genre. Little, M.D.                           Procedure Report  PROCEDURE:   Colonoscopy.  MEDICATIONS:  Fentanyl 75 mcg, versed 7 mg IV.  SCOPE:  Adult Olympus video colonoscope.  INDICATION FOR PROCEDURE:  Strong family history of colon cancer with a personal history of rectal bleeding.  DESCRIPTION OF PROCEDURE:  The procedure had been explained to the patient and consent obtained.  With the patient in the left lateral decubitus position, digital exam was performed and the Olympus adult video colonoscope inserted and advanced under direct vision. The prep was excellent. We were able to advance to the cecum without difficulty. The ileocecal valve and appendiceal orifice were identified. The scope was withdrawn. The cecum, ascending colon, hepatic flexure, transverse colon, splenic flexure, descending and sigmoid colon were seen well upon removal. No polyps seen. No significant diverticular disease. The scope withdrawn in the rectum. The rectum free of polyps or internal hemorrhoids in the rectum. The scope was withdrawn. The patient tolerated the procedure well.  ASSESSMENT: 1. Internal hemorrhoids. 2. No evidence of polyps despite her strong family history of colon cancer.  PLAN:  Will recommend repeating in 5 years due to her family history of colon cancer and will give instructions for hemorrhoids. DD:  01/04/00 TD:  01/09/00 Job: [id number]

## 2011-01-05 NOTE — Discharge Summary (Signed)
[name], [name]                       ACCOUNT NO.:  [id number]   MEDICAL RECORD NO.:  [id number]                   PATIENT TYPE:  INP   LOCATION:  0372                                 FACILITY:  [facility]   PHYSICIAN:  Thora Lance, M.D.               DATE OF BIRTH:  Sep 24, 1960   DATE OF ADMISSION:  05/15/2002  DATE OF DISCHARGE:  05/16/2002                                 DISCHARGE SUMMARY   REASON FOR ADMISSION:  This is a 50 year old female with a history of deep  vein thrombosis and pulmonary embolism who presented with new onset  palpitations and shortness of breath on the night of 05/14/02.  She had some  associated nausea and lightheadedness.  The patient had a history of two  miscarriages, and multiple complications of deep vein thrombosis and  pulmonary embolism around pregnancies in the past.  She had been reported  being treated with subcutaneous heparin during pregnancy in the past, but no  long-term anticoagulation.   PHYSICAL EXAMINATION:  VITAL SIGNS:  Blood pressure 168/125, heart rate 134,  respiratory rate 32, temperature 98.7, oxygen saturation was 100% on 2 L.  HEART:  Tachycardic rhythm.  ABDOMEN:  Mild epigastric discomfort.  LUNGS:  Clear.   LABORATORY DATA:  On admission, CBC showed white blood cell count of 9.8,  hemoglobin 11.9, platelets 393.  Urinalysis showed trace of hemoglobin.  Chemistry showed sodium 140, potassium 3.3, chloride 111, bicarbonate 24,  glucose 113, BUN 14, creatinine 1.1, calcium 9.9.  Total protein 8.2,  albumin 4.0, SGOT 29, SGPT 40, alkaline phosphatase 72, total bilirubin 0.5.  Urine drug screen was negative.  Creatinine kinase was 140, creatinine  kinase MB was 1.2, troponin-I was 0.01, and remained negative.  TSH was  1.58.  EKG showed atrial fibrillation with a rapid ventricular response.   HOSPITAL COURSE:  The patient was admitted with a new onset atrial  fibrillation with rapid ventricular response.  She was  started on an IV  Diltiazem drip.  Her rhythm spontaneously converted to normal sinus rhythm,  and she remained in normal sinus rhythm throughout the hospitalization.  TSH  was checked and was negative.  A CT scan of the chest was negative for  pulmonary embolus.  Echocardiogram was done, and is pending at the time of  this dictation.  Her cardiac enzymes remained negative.  Her potassium was  repleted and documented to be normal prior to discharge.  She was started on  Lovenox, but this was discontinued at discharge.  She was also started on  oral aspirin.  Her blood pressure at discharge was 121/63, and telemetry  showed normal sinus rhythm.   DISCHARGE DIAGNOSIS:  Atrial fibrillation with rapid ventricular response.   PROCEDURE:  1. CT scan of the chest.  2. Echocardiogram.   DISCHARGE MEDICATIONS:  1. Aspirin 325 mg one q.d.  2. Atenolol 50 mg one q.d.   DIET:  Regular.   ACTIVITY:  As tolerated.  No work note for 9/26 through 05/18/02 given.   FOLLOWUP:  In two weeks with Dr. Earl Gala.  Told to call office or go to hospital if recurrent palpitations, shortness  of breath, or any chest pain develop.                                                 Thora Lance, M.D.    Delorse Limber  D:  05/16/2002  T:  05/16/2002  Job:  [id number]   cc:   Theressa Millard, M.D.  [address] [place]  [place] [address]  Fax: [phone]

## 2011-01-05 NOTE — Op Note (Signed)
NAMEGESSICA, [name]             ACCOUNT NO.:  [id number]   MEDICAL RECORD NO.:  [id number]          PATIENT TYPE:  AMB   LOCATION:  NESC                         FACILITY:  [facility]   PHYSICIAN:  Anselm Pancoast. Weatherly, M.D.DATE OF BIRTH:  14-Sep-1960   DATE OF PROCEDURE:  03/28/2006  DATE OF DISCHARGE:                                 OPERATIVE REPORT   PREOPERATIVE DIAGNOSIS:  Recurrent large cyst, left breast.   OPERATION:  Left partial mastectomy.   POSTOPERATIVE DIAGNOSIS:  Fibrocystic disease with intraluminal  fibroadenoma, or possibly __________  type breast mass.   ANESTHESIA:  General.   SURGEON:  Anselm Pancoast. Zachery Dakins, M.D.   HISTORY:  [name] is a 50 year old female who is still  premenopausal, who I first saw in March with a cyst easily palpable in the  left breast.  She had an ultrasound and mammograms at [facility] and  clinically this was thought to be about a 3 cm cyst.  On examination it  appeared larger to me and I did an aspiration cytology.  She was on Coumadin  for a history of previous pulmonary embolism, and the area was gone after  the aspiration, but I saw her back in the office approximately six weeks  later and the cyst was obviously reoccurring.  I discussed with her about we  could repeat the aspiration, or possibly excision.  Since she was on  Coumadin we elected to repeat the aspiration.  The first cytology, which I  had sent the fluid, showed nothing suspicious, and we did a second  aspiration.  I then saw her again in July and obviously the area had  reoccurred and I recommended that we proceed with excision of this.  She had  missed several appointments and had been mailed cards to her about the  follow up and, during this interim, she had been taken off her Coumadin  because of now about 3 years since her last problem with the pulmonary  embolus.  She is for excision of this now.  I saw her in the office about 2  weeks ago.  She had a  period last week and the cyst has decreased in size  since her period, but it is still easily identified.  There is not  significant other areas of fibrocystic disease within her breast.   DESCRIPTION OF PROCEDURE:  Preoperatively the patient was identified, the  side marked.  I did give her a gram of Kefzol and she was taken to the  operative suite.  I made an areolar type incision, and then with the nurse  kind of pushing inferior, the mass is in the kind of medial upper area of  the left breast.  We dissected down so it elevated the skin from the actual  breast tissue, then we could kind of push the mass kind of to the field and  then started dissecting around this cyst.  I entered into the cyst and of  course the fluid came out and because of the size, but noted there was an  obvious lesion within the actual cyst that was not  the typical just  fibrocystic disease.  The area was completely excised, including this lesion  on the center, and on frozen examination they think it is benign.  There is  a little reserve made.  They want to do other specimens on whether this is a  variation of the cyst, or __________.  Clinically we are hopeful that this  is going to be a fibroadenoma associated with this fibrocystic disease.  Careful hemostasis was made.  A few sutures of 4-0 Vicryl, but most of the  bleeders were controlled with cautery.  No drain was placed and the wound  was closed in a couple of layers with 4-0 Vicryl and then a 4-0 Vicryl  subcuticular and Benzoin and Steri-strips on the areolar incision.  The  patient tolerated the procedure nicely and she was extubated and sent back  to the recovery room in stable postoperative condition.  We await the final  path report and I will see her back in the office on Tuesday or Wednesday of  next week.           ______________________________  Anselm Pancoast. Zachery Dakins, M.D.     WJW/MEDQ  D:  03/28/2006  T:  03/28/2006  Job:  [id number]

## 2011-01-05 NOTE — Discharge Summary (Signed)
NAMESYMPHANIE, [name]                       ACCOUNT NO.:  [id number]   MEDICAL RECORD NO.:  [id number]                   PATIENT TYPE:  INP   LOCATION:  0342                                 FACILITY:  [facility]   PHYSICIAN:  Sherin Quarry, MD                   DATE OF BIRTH:  11/21/1960   DATE OF ADMISSION:  06/10/2003  DATE OF DISCHARGE:  06/15/2003                                 DISCHARGE SUMMARY   [name] is a 50 year old lady with a past history of two documented  pulmonary emboli and a deep vein thrombophlebitis.  She presented on June 10, 2003, to the emergency room with a several day history of worsening  dyspnea especially with exertion.  She felt short of breath with minimal  activity.  She also had pleuritic chest pain which was made worse by taking  a deep breath.  The pain was described as being in the right breast and  upper chest area.  The patient had been taking Coumadin for five or six  years because of these recurrent episodes of thrombophlebitis but had  stopped it on her own.  She has no history of smoking, and does not use  birth control pills.   PHYSICAL EXAMINATION:  Physical exam at the time of admission as described  by Dr. Crista Curb:  VITAL SIGNS:  O2 saturation was 100% on room air, blood pressure was 170/91  initially, then came down to 137/90, temperature was 99.6, pulse was 84.  HEENT:  Within normal limits.  CHEST:  Clear to auscultation.  There was no wheezing or rhonchi.  CARDIOVASCULAR:  Normal S1 and S2.  There were no murmurs, rubs, or gallops.  ABDOMEN:  Soft and normal bowel sounds without masses, tenderness, or  organomegaly.  NEUROLOGIC:  Testing normal.  EXTREMITIES:  Normal.   LABORATORY DATA:  Initial laboratory studies obtained included a CBC which  revealed a hemoglobin of 10.2, the INR was 1.1, CMET was within normal  limits.  Ferritin was 4.  Urine pregnancy test was negative.  A  hypercoagulable screen was  performed.  The anticardiolipin antibody was less  than 11, which is apparently normal.  X-ray studies obtained included a  chest x-ray which showed some peribronchial thickening and bibasilar  atelectasis.  EKG showed normal sinus rhythm.  A CT scan of the chest showed  no large pulmonary emboli; the contrast was described as poorly contrasted.  No deep vein thrombophlebitis was identified.   HOSPITAL COURSE:  During the course of the hospitalization the patient was  seen in consultation by Dr. Daleen Squibb of the cardiology service.  He recommended  outpatient Cardiolite study and this was arranged.  He agreed with the idea  of anticoagulating the patient in light of her history.  On June 15, 2003, arrangements were made for the patient to receive Lovenox as an  outpatient, and therefore it  was felt prudent to proceed with her discharge.  During the course of her hospitalization she received 120 mg of Lovenox  subcu b.i.d.  She was started on Coumadin 10 mg daily which was given on a  daily basis with the INR coming up to 1.5 by Tuesday.  On June 15, 2003,  the patient was discharged.   DISCHARGE DIAGNOSES:  1. Possible pulmonary embolus.  2. Anemia with iron deficiency.  3. 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   DISCHARGE MEDICATIONS:  1. The patient will be advised to take Lovenox 120 mg subcu every 12 hours     for the next three days.  2. She will also be advised to take Coumadin 1-1/2, 5 mg tablets on Tuesday,     Wednesday, and Thursday.  She will then be advised to come to [facility] to have her prothrombin time measured on Friday.  It was very     clearly explained to the patient that it was essential that she have her     prothrombin time measured on a regular basis in order for chronic     Coumadin therapy to be safe.   CONDITION ON DISCHARGE:  Good.                                               Sherin Quarry, MD    SY/MEDQ  D:   06/15/2003  T:  06/15/2003  Job:  [id number]   cc:   [facility]   Jesse Sans. Wall, M.D.

## 2011-01-05 NOTE — Consult Note (Signed)
NAMEBRICELYN, FREESTONE                       ACCOUNT NO.:  [id number]   MEDICAL RECORD NO.:  [id number]                   PATIENT TYPE:  INP   LOCATION:  0342                                 FACILITY:  [facility]   PHYSICIAN:  Jesse Sans. Wall, M.D.                DATE OF BIRTH:  01-Mar-1961   DATE OF CONSULTATION:  06/14/2003  DATE OF DISCHARGE:                                   CONSULTATION   CARDIOLOGY CONSULTATION:   REASON FOR CONSULTATION:  We were asked by the [facility] Hospital[facility] team to  evaluate Ms. [name] for some chest discomfort and a history of what  sounds like SVT.   BRIEF HISTORY:  She is a delightful 50 year old African-American female who  presented on 07-04-03 with pronounced sharp chest pain over her  right breast which then became sort of a dull ache.  It was accompanied by  dyspnea.   Her cardiac enzymes have been negative.  She has had some recurrent  tightness in her chest which occurred Friday night, June 11, 2003.  She  was given nitroglycerin x2 with some question of relief.  Her cardiac  enzymes remained negative x3 after that event.   She had a CT scan which is equivocal of pulmonary embolus.  She has had a  previous pulmonary embolus at age [age].  She has had DVT twice.  She has  never had a procoagulant workup.   PAST MEDICAL HISTORY:  In addition to the above she reported to emergency  room in April 2004 with a heart rate of 220 beats per minute she says.  She  probably had some SVT which she said broke with some medication.  That was  the first time she had ever had this.   She has a family history of coronary disease with father dying of an MI age  78.  She has no other conventional risk factors other than obesity.  She  does not know her lipid panel or status.   Previous surgery includes cholecystectomy.   Her allergies are DILAUDID.  On admission she was only on aspirin.  She  stopped her Coumadin when she had her child  and has not resumed it.   SOCIAL HISTORY:  She lives in [place].  She is separated, has two  children.  She does not smoke or drink or use drugs such as cocaine.   FAMILY HISTORY:  As above.   REVIEW OF SYSTEMS:  Is really noncontributory.   PHYSICAL EXAMINATION:  VITAL SIGNS:  Her blood pressure 132/75, her pulse is  68 and regular, temperature is 98.1, respiratory rate is 20, O2 saturations  98% on room air.  GENERAL:  She is in no acute distress.  Skin is warm and dry.  Alert and  oriented x3.  Neuro exam is intact.  HEENT:  Normal.  NECK:  No JVD; there is no carotid bruits with  good upstrokes.  Thyroid is  not enlarged.  LUNGS:  Clear; no rub is appreciated.  HEART:  Regular rate and rhythm without murmur, rub, or gallop.  ABDOMEN:  Soft, good bowel sounds.  There is some mild right upper quadrant  tenderness.  EXTREMITIES:  No edema; pulses are brisk bilaterally.  There is no evidence  of DVT.  NEUROLOGIC:  Intact.   ELECTROCARDIOGRAM:  EKG is completely normal with a normal PR, QRS, and QTC  interval.  There is no ST segment changes.   LABORATORY DATA:  Really unremarkable.   Cardiolipin antibody was less than 0.12.  D-dimer was 0.38.  INR is 1.4.   ASSESSMENT AND RECOMMENDATION:  1. Chest discomfort most likely attributable to her pulmonary process.  Even     with an equivocal pulmonary embolus by CT scan, history of a previous     pulmonary embolus with no other risk factors certainly warrants Coumadin     and perhaps lifelong Coumadin.  I would recommend a hematology     coagulation consult to do a coagulation profile to rule out any     underlying factor deficiency or excess.  2. Chest pain with minimal cardiac risk factors.  I would check a fasting     lipid panel.  I will schedule her for followup with me in the  office at     [facility] post discharge.  We may do a functional study at that     time but I would not do that at this time.  3. Obesity.   Clearly with her weight she is at high risk of developing     metabolic syndrome.  I would recommend dietary counseling and a regular     exercise program once discharged.  I will talk to her about this in the     office as well.  4. History of what sounds like supraventricular tachycardia.  If this     becomes a recurrent problem we could recommend electrophysiological     consultation and possible ablation therapy.  I discussed this with     patient quite openly.   Thank you very much for the consultation.                                               Thomas C. Wall, M.D.    TCW/MEDQ  D:  06/14/2003  T:  06/14/2003  Job:  [id number]   cc:   Corinna L. Lendell Caprice, MD  Fax: [phone]   Melissa L. Ladona Ridgel, MD  Fax: [phone]

## 2011-01-27 ENCOUNTER — Other Ambulatory Visit: Payer: Self-pay | Admitting: Internal Medicine

## 2011-01-27 DIAGNOSIS — I10 Essential (primary) hypertension: Secondary | ICD-10-CM

## 2011-01-29 ENCOUNTER — Other Ambulatory Visit: Payer: Self-pay | Admitting: Internal Medicine

## 2011-02-07 ENCOUNTER — Other Ambulatory Visit: Payer: Self-pay | Admitting: Internal Medicine

## 2011-02-07 ENCOUNTER — Encounter: Payer: Self-pay | Admitting: Internal Medicine

## 2011-02-07 ENCOUNTER — Ambulatory Visit (INDEPENDENT_AMBULATORY_CARE_PROVIDER_SITE_OTHER): Payer: Self-pay | Admitting: Internal Medicine

## 2011-02-07 VITALS — BP 127/87 | HR 76 | Temp 97.3°F | Ht 71.0 in | Wt 264.0 lb

## 2011-02-07 DIAGNOSIS — Z0000 Encounter for general adult medical examination without abnormal findings: Secondary | ICD-10-CM | POA: Insufficient documentation

## 2011-02-07 DIAGNOSIS — M1990 Unspecified osteoarthritis, unspecified site: Secondary | ICD-10-CM | POA: Insufficient documentation

## 2011-02-07 DIAGNOSIS — I10 Essential (primary) hypertension: Secondary | ICD-10-CM

## 2011-02-07 DIAGNOSIS — Z86718 Personal history of other venous thrombosis and embolism: Secondary | ICD-10-CM

## 2011-02-07 DIAGNOSIS — D509 Iron deficiency anemia, unspecified: Secondary | ICD-10-CM

## 2011-02-07 DIAGNOSIS — M129 Arthropathy, unspecified: Secondary | ICD-10-CM

## 2011-02-07 DIAGNOSIS — I82409 Acute embolism and thrombosis of unspecified deep veins of unspecified lower extremity: Secondary | ICD-10-CM

## 2011-02-07 LAB — FERRITIN: Ferritin: 92 ng/mL (ref 10–291)

## 2011-02-07 LAB — IRON AND TIBC
%SAT: 17 % — ABNORMAL LOW (ref 20–55)
Iron: 54 ug/dL (ref 42–145)
TIBC: 318 ug/dL (ref 250–470)
UIBC: 264 ug/dL

## 2011-02-07 LAB — BASIC METABOLIC PANEL
BUN: 14 mg/dL (ref 6–23)
CO2: 25 mEq/L (ref 19–32)
Calcium: 10 mg/dL (ref 8.4–10.5)
Chloride: 101 mEq/L (ref 96–112)
Creat: 1.05 mg/dL (ref 0.50–1.10)
Glucose, Bld: 87 mg/dL (ref 70–99)
Potassium: 4 mEq/L (ref 3.5–5.3)
Sodium: 138 mEq/L (ref 135–145)

## 2011-02-07 LAB — CBC
HCT: 38.9 % (ref 36.0–46.0)
Hemoglobin: 13.6 g/dL (ref 12.0–15.0)
MCH: 29.3 pg (ref 26.0–34.0)
MCHC: 35 g/dL (ref 30.0–36.0)
MCV: 83.8 fL (ref 78.0–100.0)
Platelets: 333 10*3/uL (ref 150–400)
RBC: 4.64 MIL/uL (ref 3.87–5.11)
RDW: 12.3 % (ref 11.5–15.5)
WBC: 7.7 10*3/uL (ref 4.0–10.5)

## 2011-02-07 LAB — VITAMIN B12: Vitamin B-12: 457 pg/mL (ref 211–911)

## 2011-02-07 MED ORDER — OMEPRAZOLE MAGNESIUM 20 MG PO TBEC: 20.0000 mg | DELAYED_RELEASE_TABLET | Freq: Every day | ORAL | Status: DC

## 2011-02-07 MED ORDER — IBUPROFEN 600 MG PO TABS: 600.0000 mg | ORAL_TABLET | Freq: Four times a day (QID) | ORAL | Status: AC

## 2011-02-07 NOTE — Progress Notes (Signed)
  Subjective:    Patient ID: [name], female    DOB: 04-02-1961, 50 y.o.   MRN: [id number]  HPI This is a 50 year old female with past history see significant for hypertension, recurrent  history of PEs and DVTs, iron deficiency anemia who presented to the clinic with joint pain. Patient noted that she is having joint pain all over the body which is progressively getting worse. If she is sitting for too long she would have sever pain and would be not able to get up from the chair. She can not pinpoint and the pain is worse in the morning. She noted swelling and decreased range of motion due to pain. Denies any fevers or chills, recent travel, insect bite.   Review of Systems  Constitutional: Negative for fever and chills.  HENT: Negative for neck pain.   Respiratory: Negative for chest tightness, shortness of breath and wheezing.   Cardiovascular: Negative for chest pain, palpitations and leg swelling.  Gastrointestinal: Negative for abdominal pain, diarrhea, constipation and abdominal distention.  Genitourinary: Negative for difficulty urinating.  Musculoskeletal: Positive for joint swelling and arthralgias. Negative for back pain.  Neurological: Negative for dizziness, weakness and light-headedness.       Objective:   Physical Exam  Constitutional: She is oriented to person, place, and time. She appears well-developed.  HENT:  Head: Normocephalic.  Neck: Neck supple.  Cardiovascular: Normal rate, regular rhythm and normal heart sounds.   Pulmonary/Chest: Effort normal and breath sounds normal.  Abdominal: Soft. Bowel sounds are normal. She exhibits distension. There is no tenderness.  Musculoskeletal: Normal range of motion. She exhibits edema and tenderness.       Right shoulder: She exhibits bony tenderness and swelling.       Left shoulder: She exhibits bony tenderness.       Right wrist: She exhibits bony tenderness and swelling.       Left wrist: She exhibits  tenderness and swelling.       Right ankle: She exhibits swelling. tenderness.       Left ankle: She exhibits swelling. tenderness.  Neurological: She is alert and oriented to person, place, and time.  Skin: No rash noted.          Assessment & Plan:

## 2011-02-07 NOTE — Assessment & Plan Note (Signed)
Pressure well controlled. Continue current regimen. Will check basic metabolic panel today.

## 2011-02-07 NOTE — Assessment & Plan Note (Signed)
#  1 mammogram: Per screening center mammogram was performed in August of 2011 which was within normal limits. We'll obtain records. #2 colonoscopy: 2010 with no acute process. Recommendation was to repeat in 5 years due to significant history of colon cancer.

## 2011-02-07 NOTE — Assessment & Plan Note (Signed)
Patient was noted to have oligoarthritis. It seems every single joint is affected. Patient noted she had symptoms in the past but never that severe. Patient had prior notes history of arthritis elevated ESR but no further workup was initiated. I will obtain today ESR., C-reactive protein and rheumatoid factor. I started the patient on ibuprofen 600 every 6 hours and informed her that she needs to take with food. Patient has not history of GERD but has not been taking any Prilosec but since starting ibuprofen and I will restart it. Awaiting results for further management.

## 2011-02-07 NOTE — Assessment & Plan Note (Signed)
The patient was noted to have anemia due to iron deficiency disease is currently taking ferrous sulfate. Not documentation of any of anemia panel. Will obtain today.

## 2011-02-08 ENCOUNTER — Other Ambulatory Visit: Payer: Self-pay | Admitting: Internal Medicine

## 2011-02-08 ENCOUNTER — Other Ambulatory Visit: Payer: Self-pay | Admitting: *Deleted

## 2011-02-08 DIAGNOSIS — M1990 Unspecified osteoarthritis, unspecified site: Secondary | ICD-10-CM

## 2011-02-08 LAB — SEDIMENTATION RATE: Sed Rate: 38 mm/hr — ABNORMAL HIGH (ref 0–22)

## 2011-02-08 LAB — FOLATE RBC: RBC Folate: 669 ng/mL (ref >=366)

## 2011-02-08 LAB — C-REACTIVE PROTEIN: CRP: 0.9 mg/dL — ABNORMAL HIGH (ref <0.6)

## 2011-02-08 LAB — RHEUMATOID FACTOR: Rhuematoid fact SerPl-aCnc: <10 IU/mL (ref <=14)

## 2011-02-09 LAB — CYCLIC CITRUL PEPTIDE ANTIBODY, IGG: Cyclic Citrullin Peptide Ab: <2 U/mL (ref 0.0–5.0)

## 2011-02-12 NOTE — Progress Notes (Signed)
Addended by: Bufford Spikes on: 02/12/2011 09:30 AM   Modules accepted: Orders

## 2011-04-02 ENCOUNTER — Emergency Department (HOSPITAL_COMMUNITY): Payer: Self-pay

## 2011-04-02 ENCOUNTER — Emergency Department (HOSPITAL_COMMUNITY)
Admission: EM | Admit: 2011-04-02 | Discharge: 2011-04-02 | Disposition: A | Discharge status: Home / Self Care | Payer: Self-pay | Attending: Emergency Medicine | Admitting: Emergency Medicine

## 2011-04-02 ENCOUNTER — Encounter (HOSPITAL_COMMUNITY): Payer: Self-pay

## 2011-04-02 DIAGNOSIS — M25569 Pain in unspecified knee: Secondary | ICD-10-CM | POA: Insufficient documentation

## 2011-04-02 DIAGNOSIS — R079 Chest pain, unspecified: Secondary | ICD-10-CM | POA: Insufficient documentation

## 2011-04-02 DIAGNOSIS — M7989 Other specified soft tissue disorders: Secondary | ICD-10-CM | POA: Insufficient documentation

## 2011-04-02 DIAGNOSIS — I10 Essential (primary) hypertension: Secondary | ICD-10-CM | POA: Insufficient documentation

## 2011-04-02 DIAGNOSIS — M79609 Pain in unspecified limb: Secondary | ICD-10-CM

## 2011-04-02 DIAGNOSIS — R0602 Shortness of breath: Secondary | ICD-10-CM | POA: Insufficient documentation

## 2011-04-02 DIAGNOSIS — Z86718 Personal history of other venous thrombosis and embolism: Secondary | ICD-10-CM | POA: Insufficient documentation

## 2011-04-02 LAB — DIFFERENTIAL
Basophils Absolute: 0 10*3/uL (ref 0.0–0.1)
Basophils Relative: 0 % (ref 0–1)
Eosinophils Absolute: 0.1 10*3/uL (ref 0.0–0.7)
Eosinophils Relative: 1 % (ref 0–5)
Lymphocytes Relative: 39 % (ref 12–46)
Lymphs Abs: 2.3 10*3/uL (ref 0.7–4.0)
Monocytes Absolute: 0.5 10*3/uL (ref 0.1–1.0)
Monocytes Relative: 8 % (ref 3–12)
Neutro Abs: 3.1 10*3/uL (ref 1.7–7.7)
Neutrophils Relative %: 52 % (ref 43–77)

## 2011-04-02 LAB — CBC
HCT: 37.2 % (ref 36.0–46.0)
Hemoglobin: 12.6 g/dL (ref 12.0–15.0)
MCH: 29.4 pg (ref 26.0–34.0)
MCHC: 33.9 g/dL (ref 30.0–36.0)
MCV: 86.7 fL (ref 78.0–100.0)
Platelets: 275 10*3/uL (ref 150–400)
RBC: 4.29 MIL/uL (ref 3.87–5.11)
RDW: 13.4 % (ref 11.5–15.5)
WBC: 6 10*3/uL (ref 4.0–10.5)

## 2011-04-02 LAB — BASIC METABOLIC PANEL
BUN: 13 mg/dL (ref 6–23)
CO2: 26 mEq/L (ref 19–32)
Calcium: 9.8 mg/dL (ref 8.4–10.5)
Chloride: 102 mEq/L (ref 96–112)
Creatinine, Ser: 0.94 mg/dL (ref 0.50–1.10)
GFR calc Af Amer: >60 mL/min (ref >60)
GFR calc non Af Amer: >60 mL/min (ref >60)
Glucose, Bld: 82 mg/dL (ref 70–99)
Potassium: 3.9 mEq/L (ref 3.5–5.1)
Sodium: 137 mEq/L (ref 135–145)

## 2011-04-02 LAB — POCT I-STAT TROPONIN I: Troponin i, poc: 0 ng/mL (ref 0.00–0.08)

## 2011-04-02 LAB — PROTIME-INR
INR: 0.97 (ref 0.00–1.49)
Prothrombin Time: 13.1 seconds (ref 11.6–15.2)

## 2011-04-02 MED ORDER — IOHEXOL 300 MG/ML  SOLN: 100.0000 mL | Freq: Once | INTRAMUSCULAR | Status: DC | PRN

## 2011-05-15 LAB — CBC
HCT: 33.3 — ABNORMAL LOW
Hemoglobin: 11.4 — ABNORMAL LOW
MCHC: 34
MCV: 82.6
Platelets: 343
RBC: 4.04
RDW: 14.4
WBC: 7

## 2011-05-15 LAB — DIFFERENTIAL
Basophils Absolute: 0.1
Basophils Relative: 1
Eosinophils Absolute: 0.1
Eosinophils Relative: 2
Lymphocytes Relative: 38
Lymphs Abs: 2.6
Monocytes Absolute: 0.5
Monocytes Relative: 8
Neutro Abs: 3.7
Neutrophils Relative %: 52

## 2011-05-15 LAB — POCT CARDIAC MARKERS
CKMB, poc: 1.5
Myoglobin, poc: 57.1
Operator id: 1192
Troponin i, poc: <0.05

## 2011-05-15 LAB — BASIC METABOLIC PANEL
BUN: 11
CO2: 25
Calcium: 9.2
Chloride: 105
Creatinine, Ser: 1.04
GFR calc Af Amer: >60
GFR calc non Af Amer: 57 — ABNORMAL LOW
Glucose, Bld: 97
Potassium: 3.6
Sodium: 138

## 2011-05-15 LAB — PROTIME-INR
INR: 2.4 — ABNORMAL HIGH
Prothrombin Time: 26.6 — ABNORMAL HIGH

## 2011-05-17 LAB — DIFFERENTIAL
Basophils Absolute: 0
Basophils Relative: 0
Eosinophils Absolute: 0.1
Eosinophils Relative: 1
Lymphocytes Relative: 37
Lymphs Abs: 2.5
Monocytes Absolute: 0.5
Monocytes Relative: 8
Neutro Abs: 3.7
Neutrophils Relative %: 54

## 2011-05-17 LAB — POCT CARDIAC MARKERS
CKMB, poc: 2
CKMB, poc: 2.1
Myoglobin, poc: 107
Myoglobin, poc: 112
Operator id: 280141
Operator id: 280141
Troponin i, poc: <0.05
Troponin i, poc: <0.05

## 2011-05-17 LAB — PROTIME-INR
INR: 1.2
Prothrombin Time: 15.6 — ABNORMAL HIGH

## 2011-05-17 LAB — BASIC METABOLIC PANEL
BUN: 12
CO2: 27
Calcium: 9
Chloride: 103
Creatinine, Ser: 1.06
GFR calc Af Amer: >60
GFR calc non Af Amer: 55 — ABNORMAL LOW
Glucose, Bld: 98
Potassium: 3.3 — ABNORMAL LOW
Sodium: 137

## 2011-05-17 LAB — CBC
HCT: 30.3 — ABNORMAL LOW
Hemoglobin: 10.1 — ABNORMAL LOW
MCHC: 33.3
MCV: 83.3
Platelets: 319
RBC: 3.63 — ABNORMAL LOW
RDW: 14.6
WBC: 6.9

## 2011-06-06 LAB — BASIC METABOLIC PANEL
BUN: 12
BUN: 13
CO2: 22
CO2: 24
Calcium: 9
Calcium: 9.3
Chloride: 107
Chloride: 108
Creatinine, Ser: 0.86
Creatinine, Ser: 0.97
GFR calc Af Amer: >60
GFR calc Af Amer: >60
GFR calc non Af Amer: >60
GFR calc non Af Amer: >60
Glucose, Bld: 84
Glucose, Bld: 93
Potassium: 3.6
Potassium: 4.1
Sodium: 135
Sodium: 138

## 2011-06-06 LAB — MICROALBUMIN / CREATININE URINE RATIO
Creatinine, Urine: 213.4
Microalb Creat Ratio: 7.2
Microalb, Ur: 1.53

## 2011-06-06 LAB — CARDIAC PANEL(CRET KIN+CKTOT+MB+TROPI)
CK, MB: 1.7
CK, MB: 1.8
CK, MB: 2.2
Relative Index: 1
Relative Index: 1
Relative Index: 1.1
Total CK: 168
Total CK: 184 — ABNORMAL HIGH
Total CK: 206 — ABNORMAL HIGH
Troponin I: 0.01
Troponin I: 0.01
Troponin I: 0.02

## 2011-06-06 LAB — LIPID PANEL
Cholesterol: 176
HDL: 41
LDL Cholesterol: 124 — ABNORMAL HIGH
Total CHOL/HDL Ratio: 4.3
Triglycerides: 56
VLDL: 11

## 2011-06-06 LAB — DIFFERENTIAL
Basophils Absolute: 0
Basophils Relative: 0
Eosinophils Absolute: 0.1
Eosinophils Relative: 1
Lymphocytes Relative: 41
Lymphs Abs: 2.9
Monocytes Absolute: 0.3
Monocytes Relative: 5
Neutro Abs: 3.8
Neutrophils Relative %: 53

## 2011-06-06 LAB — PROTIME-INR
INR: 1
INR: 1
Prothrombin Time: 12.8
Prothrombin Time: 13

## 2011-06-06 LAB — CBC
HCT: 33.5 — ABNORMAL LOW
HCT: 33.9 — ABNORMAL LOW
HCT: 35.1 — ABNORMAL LOW
HCT: 35.5 — ABNORMAL LOW
Hemoglobin: 11.2 — ABNORMAL LOW
Hemoglobin: 11.3 — ABNORMAL LOW
Hemoglobin: 11.7 — ABNORMAL LOW
Hemoglobin: 11.8 — ABNORMAL LOW
MCHC: 33
MCHC: 33.3
MCHC: 33.4
MCHC: 33.7
MCV: 83
MCV: 83.5
MCV: 83.8
MCV: 85
Platelets: 353
Platelets: 359
Platelets: 371
Platelets: 374
RBC: 3.98
RBC: 4.04
RBC: 4.21
RBC: 4.24
RDW: 15.9 — ABNORMAL HIGH
RDW: 15.9 — ABNORMAL HIGH
RDW: 15.9 — ABNORMAL HIGH
RDW: 16 — ABNORMAL HIGH
WBC: 5.4
WBC: 6
WBC: 6.8
WBC: 7.2

## 2011-06-06 LAB — RAPID URINE DRUG SCREEN, HOSP PERFORMED
Amphetamines: NOT DETECTED
Barbiturates: NOT DETECTED
Benzodiazepines: NOT DETECTED
Cocaine: NOT DETECTED
Opiates: NOT DETECTED
Tetrahydrocannabinol: NOT DETECTED

## 2011-06-06 LAB — BLOOD GAS, ARTERIAL
Acid-base deficit: 4.6 — ABNORMAL HIGH
Bicarbonate: 18.9 — ABNORMAL LOW
FIO2: 0.21
O2 Saturation: 96.5
Patient temperature: 98.6
TCO2: 19.8
pCO2 arterial: 29.1 — ABNORMAL LOW
pH, Arterial: 7.429 — ABNORMAL HIGH
pO2, Arterial: 88.5

## 2011-06-06 LAB — COMPREHENSIVE METABOLIC PANEL
ALT: 32
AST: 23
Albumin: 3.6
Alkaline Phosphatase: 54
BUN: 15
CO2: 21
Calcium: 9.3
Chloride: 107
Creatinine, Ser: 0.96
GFR calc Af Amer: >60
GFR calc non Af Amer: >60
Glucose, Bld: 155 — ABNORMAL HIGH
Potassium: 3.2 — ABNORMAL LOW
Sodium: 138
Total Bilirubin: 0.5
Total Protein: 7.2

## 2011-06-06 LAB — URINALYSIS, DIPSTICK ONLY
Bilirubin Urine: NEGATIVE
Glucose, UA: NEGATIVE
Hgb urine dipstick: NEGATIVE
Ketones, ur: NEGATIVE
Leukocytes, UA: NEGATIVE
Nitrite: NEGATIVE
Protein, ur: NEGATIVE
Specific Gravity, Urine: 1.024
Urobilinogen, UA: 0.2
pH: 6

## 2011-06-06 LAB — D-DIMER, QUANTITATIVE: D-Dimer, Quant: 0.36

## 2011-06-06 LAB — IRON AND TIBC
Iron: 37 — ABNORMAL LOW
Saturation Ratios: 11 — ABNORMAL LOW
TIBC: 343
UIBC: 306

## 2011-06-06 LAB — TSH: TSH: 1.495

## 2011-06-06 LAB — PREGNANCY, URINE: Preg Test, Ur: NEGATIVE

## 2011-06-06 LAB — FERRITIN: Ferritin: 14 (ref 10–291)

## 2011-06-06 LAB — MAGNESIUM: Magnesium: 2.1

## 2011-06-06 LAB — APTT: aPTT: 30

## 2011-06-06 LAB — T4, FREE: Free T4: 1.21

## 2011-07-19 ENCOUNTER — Encounter: Payer: Self-pay | Admitting: Internal Medicine

## 2011-07-19 ENCOUNTER — Ambulatory Visit (INDEPENDENT_AMBULATORY_CARE_PROVIDER_SITE_OTHER): Payer: Self-pay | Admitting: Internal Medicine

## 2011-07-19 VITALS — BP 130/87 | HR 79 | Temp 97.5°F | Ht 71.0 in | Wt 277.2 lb

## 2011-07-19 DIAGNOSIS — I10 Essential (primary) hypertension: Secondary | ICD-10-CM

## 2011-07-19 DIAGNOSIS — M1990 Unspecified osteoarthritis, unspecified site: Secondary | ICD-10-CM

## 2011-07-19 DIAGNOSIS — M129 Arthropathy, unspecified: Secondary | ICD-10-CM

## 2011-07-19 MED ORDER — TRIAMTERENE-HCTZ 37.5-25 MG PO TABS: 1.0000 | ORAL_TABLET | Freq: Every day | ORAL | Status: DC

## 2011-07-19 MED ORDER — MELOXICAM 7.5 MG PO TABS: 15.0000 mg | ORAL_TABLET | Freq: Every day | ORAL | Status: DC

## 2011-07-19 NOTE — Progress Notes (Signed)
  Subjective:    Patient ID: [name], female    DOB: [birth year], 50 y.o.   MRN: [id number]  HPI [name] is a 50 year old woman with past medical history significant for iron deficiency anemia, hypertension and arthritis who presents for left knee pain.  1.) Left knee pain - patient states that her left knee swells up. She is having difficulty with ambulation. She states that her knee is often stiff in the morning or after sitting for a long time. Patient states that she was taking Aleve which did help with the pain however she stopped taking it as she is afraid of the potential side effects. She also feels that her recent weight gain is contributing to worsening pain symptoms. No fevers or chills reported.  Patient has no other complaints or concerns today. She denies chest pain, cough, sob, headache, N/V, changes in abdominal and urinary character.    Review of Systems  All other systems reviewed and are negative.       Objective:   Physical Exam  Constitutional: She appears well-developed.  Cardiovascular: Normal rate.   Pulmonary/Chest: Effort normal.  Musculoskeletal:       Joint effusion present over her medial aspect of the anterior knee. Warmth to touch over the medial aspect No erythema Decreased range of motion secondary to pain Difficulty with extension and flexion of the knee Difficulty with external and internal rotation of the knee Right knee within normal limits.          Assessment & Plan:

## 2011-07-19 NOTE — Assessment & Plan Note (Signed)
Left knee pain most consistent with osteoarthritis. Patient would benefit from steroid injection and tap fluid, however due to time constraints as patient was 30 minutes late to her appointment cannot perform the injection today. We'll prescribe patient meloxicam for pain. Patient was encouraged to contact the clinic tomorrow or next week for possible knee injection. She would also benefit from sports medicine referral for further evaluation of knee as well as physical therapy. Patient just needs to update her insurance information before a sports medicine referral can be made. Patient was also encouraged to use ice packs to help with inflammation and swelling. X-ray of the knee to assess extent of arthritis.

## 2011-07-19 NOTE — Patient Instructions (Signed)
Please take Meloxicam once daily. Please use ice packs for knee, use 15 minutes at a time 3-4 times daily.  Please submit all of your insurance information and contact the clinic when it is done.

## 2011-07-19 NOTE — Progress Notes (Signed)
Pt was called and informed to got to the Radiology/X-ray dept on 1st floor to have knee x-ray done (at any time).  She agreed.

## 2011-07-19 NOTE — Assessment & Plan Note (Signed)
Lab Results  Component Value Date   NA 137 04/02/2011   K 3.9 04/02/2011   CL 102 04/02/2011   CO2 26 04/02/2011   BUN 13 04/02/2011   CREATININE 0.94 04/02/2011   CREATININE 1.05 02/07/2011    BP Readings from Last 3 Encounters:  07/19/11 130/87  02/07/11 127/87  04/05/10 152/96    Assessment: Hypertension control:  controlled  Progress toward goals:  at goal Barriers to meeting goals:  no barriers identified  Plan: Hypertension treatment:  continue current medications

## 2011-07-24 ENCOUNTER — Ambulatory Visit (HOSPITAL_COMMUNITY)
Admission: RE | Admit: 2011-07-24 | Discharge: 2011-07-24 | Disposition: A | Discharge status: Home / Self Care | Payer: Self-pay | Source: Ambulatory Visit | Attending: Internal Medicine | Admitting: Internal Medicine

## 2011-07-24 DIAGNOSIS — M1710 Unilateral primary osteoarthritis, unspecified knee: Secondary | ICD-10-CM | POA: Insufficient documentation

## 2011-07-24 DIAGNOSIS — M1990 Unspecified osteoarthritis, unspecified site: Secondary | ICD-10-CM

## 2011-07-24 DIAGNOSIS — IMO0002 Reserved for concepts with insufficient information to code with codable children: Secondary | ICD-10-CM | POA: Insufficient documentation

## 2011-07-24 DIAGNOSIS — M25469 Effusion, unspecified knee: Secondary | ICD-10-CM | POA: Insufficient documentation

## 2011-07-24 DIAGNOSIS — M25569 Pain in unspecified knee: Secondary | ICD-10-CM | POA: Insufficient documentation

## 2011-08-27 ENCOUNTER — Ambulatory Visit (INDEPENDENT_AMBULATORY_CARE_PROVIDER_SITE_OTHER): Payer: Self-pay | Admitting: Internal Medicine

## 2011-08-27 ENCOUNTER — Encounter: Payer: Self-pay | Admitting: Internal Medicine

## 2011-08-27 VITALS — BP 135/76 | HR 77 | Temp 97.0°F | Ht 71.0 in | Wt 275.7 lb

## 2011-08-27 DIAGNOSIS — M1990 Unspecified osteoarthritis, unspecified site: Secondary | ICD-10-CM

## 2011-08-27 DIAGNOSIS — M129 Arthropathy, unspecified: Secondary | ICD-10-CM

## 2011-08-27 MED ORDER — NAPROXEN 250 MG PO TABS: 250.0000 mg | ORAL_TABLET | Freq: Two times a day (BID) | ORAL | Status: DC

## 2011-08-27 MED ORDER — DICLOFENAC SODIUM 1 % TD GEL: 1.0000 "application " | Freq: Three times a day (TID) | TRANSDERMAL | Status: DC | PRN

## 2011-08-27 NOTE — Patient Instructions (Signed)
1. You can use cold compresses on a regular basis 2. You can use a splint for the knee

## 2011-08-28 NOTE — Assessment & Plan Note (Addendum)
An x-ray was obtained during the last office visit which showed moderate amount of joint effusion and osteoarthritis. Patient would benefit from removal of the fluids and possible steroid injection. At this point, not comfortably to do so due to significant swelling . I started patient naproxen twice a day and prescribed diclofenac gel and recommended to use cold packs on her knee. Furthermore advised her to obtain all documents to get insurance approved as soon as possible.. I would like to see her in 2 weeks for further evaluation and management.

## 2011-08-28 NOTE — Progress Notes (Signed)
Subjective:   Patient ID: Tammy DANIELSEN female   DOB: November 17, 1960 51 y.o.   MRN: [id number]  HPI: [name] is a 51 y.o. female with past medical history significant as outlined below who presented to the clinic for left knee pain which has been present for some time. She was seen in November in the clinic on and was started on Mobic and was recommended to use cold compresses. She was trying to work on her insurance to be eligible  for referral to sports medicine which she had not this point. Patient reports her pain has significantly worsened since her last office visit in she noticed swelling. Denies any redness, fevers or chills but reports that her knee sometimes gives out and she hears a popping. She further mentioned that her knee is stiff she has trouble walking due to the pain.    Past Medical History  Diagnosis Date  . Hypertension   . Breast mass 02/2008     noted on mammogram March 11, 2008 3 cm simple cyst at 9 to 10:00 position of the right breast as well as multiple other smaller simple cyst, 2.6 cm mass at the 10:00 position of the right breast also representing complex cyst,  . 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 Fibroid uterus  2001     status post total abdominal hysterectomy, right and left salpingo-oophorectomy , done by Dr. Clearance Coots  . Anemia 2009     Baseline hemoglobin at 11, secondary to iron deficiency  . Deep vein thrombosis (DVT)      history of multiple  DVTs in 1989, 1989, 2004, on chronic anticoagulation with Coumadin  . PE (pulmonary embolism)      history of multiple PEs in 1984, 1989, 2004-  electronic data reviewed in [place] and EMR and I was not able to find single CT angiogram that was positive for pulmonary  embolus nor was I able to find Doppler studies that were positive for DVTs   Current Outpatient Prescriptions  Medication Sig Dispense Refill  . ferrous sulfate 325 (65 FE) MG tablet Take 325 mg by mouth daily. Take with food.       . triamterene-hydrochlorothiazide (MAXZIDE-25) 37.5-25 MG per tablet Take 1 each (1 tablet total) by mouth daily.  30 tablet  11  . diclofenac sodium (VOLTAREN) 1 % GEL Apply 1 application topically 3 (three) times daily as needed (Pain).  1 Tube  0  . naproxen (NAPROSYN) 250 MG tablet Take 1 tablet (250 mg total) by mouth 2 (two) times daily with a meal.  14 tablet  0   Review of Systems: Constitutional: Denies fever, chills, diaphoresis, appetite change and fatigue.   Respiratory: Denies SOB, DOE, cough, chest tightness,  and wheezing.   Cardiovascular: Denies chest pain, palpitations and leg swelling.  Gastrointestinal: Denies nausea, vomiting, abdominal pain, diarrhea, constipation Skin: Denies pallor, rash and wound.      Objective:  Physical Exam: Filed Vitals:   08/27/11 1034  BP: 135/76  Pulse: 77  Temp: 97 F (36.1 C)  TempSrc: Oral  Height: 5\' 11"  (1.803 m)  Weight: 275 lb 11.2 oz (125.057 kg)   Constitutional: Vital signs reviewed.  Patient is a well-developed and well-nourished in no acute distress and cooperative with exam. Alert and  oriented x3.  Neck: Supple,  Cardiovascular: RRR, S1 normal, S2 normal,  pulses symmetric and intact bilaterally Pulmonary/Chest: CTAB, no wheezes, rales, or rhonchi Abdominal: Soft. Non-tender, non-distended, bowel sounds are normal,  Musculoskeletal:   Left knee: Swelling noted. Mild decrease in range of motion due to pain. Tenderness medial and lateral joint area. No locking. No instability and joint noted. No erythema noted. Motor strength 5 / 5 bilaterally  Neurological: A&O x3,  no focal motor deficit, sensory intact to light touch bilaterally.  Skin: Warm, dry and intact. No rash, cyanosis, or  clubbing.

## 2011-09-12 ENCOUNTER — Encounter: Payer: Self-pay | Admitting: Internal Medicine

## 2011-10-01 ENCOUNTER — Ambulatory Visit (INDEPENDENT_AMBULATORY_CARE_PROVIDER_SITE_OTHER): Payer: Self-pay | Admitting: Internal Medicine

## 2011-10-01 ENCOUNTER — Encounter: Payer: Self-pay | Admitting: Internal Medicine

## 2011-10-01 DIAGNOSIS — Z0000 Encounter for general adult medical examination without abnormal findings: Secondary | ICD-10-CM

## 2011-10-01 DIAGNOSIS — M1990 Unspecified osteoarthritis, unspecified site: Secondary | ICD-10-CM

## 2011-10-01 DIAGNOSIS — D509 Iron deficiency anemia, unspecified: Secondary | ICD-10-CM

## 2011-10-01 DIAGNOSIS — M129 Arthropathy, unspecified: Secondary | ICD-10-CM

## 2011-10-01 DIAGNOSIS — I10 Essential (primary) hypertension: Secondary | ICD-10-CM

## 2011-10-01 DIAGNOSIS — Z23 Encounter for immunization: Secondary | ICD-10-CM

## 2011-10-01 DIAGNOSIS — N6009 Solitary cyst of unspecified breast: Secondary | ICD-10-CM

## 2011-10-01 LAB — CBC WITH DIFFERENTIAL/PLATELET
Basophils Absolute: 0 10*3/uL (ref 0.0–0.1)
Basophils Relative: 0 % (ref 0–1)
Eosinophils Absolute: 0.1 10*3/uL (ref 0.0–0.7)
Eosinophils Relative: 2 % (ref 0–5)
HCT: 39.1 % (ref 36.0–46.0)
Hemoglobin: 13.1 g/dL (ref 12.0–15.0)
Lymphocytes Relative: 45 % (ref 12–46)
Lymphs Abs: 2.9 10*3/uL (ref 0.7–4.0)
MCH: 28.8 pg (ref 26.0–34.0)
MCHC: 33.5 g/dL (ref 30.0–36.0)
MCV: 85.9 fL (ref 78.0–100.0)
Monocytes Absolute: 0.5 10*3/uL (ref 0.1–1.0)
Monocytes Relative: 8 % (ref 3–12)
Neutro Abs: 2.9 10*3/uL (ref 1.7–7.7)
Neutrophils Relative %: 45 % (ref 43–77)
Platelets: 342 10*3/uL (ref 150–400)
RBC: 4.55 MIL/uL (ref 3.87–5.11)
RDW: 13.3 % (ref 11.5–15.5)
WBC: 6.4 10*3/uL (ref 4.0–10.5)

## 2011-10-01 LAB — COMPREHENSIVE METABOLIC PANEL
ALT: 30 U/L (ref 0–35)
AST: 21 U/L (ref 0–37)
Albumin: 4.2 g/dL (ref 3.5–5.2)
Alkaline Phosphatase: 70 U/L (ref 39–117)
BUN: 12 mg/dL (ref 6–23)
CO2: 23 mEq/L (ref 19–32)
Calcium: 9.8 mg/dL (ref 8.4–10.5)
Chloride: 103 mEq/L (ref 96–112)
Creat: 1.05 mg/dL (ref 0.50–1.10)
Glucose, Bld: 93 mg/dL (ref 70–99)
Potassium: 4.2 mEq/L (ref 3.5–5.3)
Sodium: 138 mEq/L (ref 135–145)
Total Bilirubin: 0.3 mg/dL (ref 0.3–1.2)
Total Protein: 7.2 g/dL (ref 6.0–8.3)

## 2011-10-01 LAB — TSH: TSH: 0.968 u[IU]/mL (ref 0.350–4.500)

## 2011-10-01 LAB — LIPID PANEL
Cholesterol: 210 mg/dL — ABNORMAL HIGH (ref 0–200)
HDL: 53 mg/dL (ref >39)
LDL Cholesterol: 136 mg/dL — ABNORMAL HIGH (ref 0–99)
Total CHOL/HDL Ratio: 4 Ratio
Triglycerides: 103 mg/dL (ref <150)
VLDL: 21 mg/dL (ref 0–40)

## 2011-10-01 MED ORDER — ACETAMINOPHEN 500 MG PO TABS: 500.0000 mg | ORAL_TABLET | Freq: Four times a day (QID) | ORAL | Status: AC | PRN

## 2011-10-01 NOTE — Assessment & Plan Note (Signed)
Blood pressure well controlled. Continue current regimen.  BP Readings from Last 3 Encounters:  10/01/11 122/81  08/27/11 135/76  07/19/11 130/87

## 2011-10-01 NOTE — Patient Instructions (Signed)
You can take Naproxen twice a day , Tylenol every 6 hours and if you are taking Aleve take it  at least 6 hours after Naproxen.

## 2011-10-01 NOTE — Assessment & Plan Note (Signed)
Will obtain CBC today  

## 2011-10-01 NOTE — Assessment & Plan Note (Signed)
Concerning for  inflammatory arthritis. Therefore I will obtain labs including rheumatoid factor, ANA, CCP, CRP, ESR, hepatitis panel, TSH, uric acid. Furthermore I will obtain an x-ray of her left ankle and foot to evaluate for any changes. I may consider a trial of prednisone even if patient is seronegative for RA . Patient had mildly elevated inflammatory markers in the past. Awaiting labs for further management.

## 2011-10-01 NOTE — Progress Notes (Signed)
Subjective:   Patient ID: [name] female   DOB: 19-Jan-1961 51 y.o.   MRN: [id number]  HPI: [name] is a 51 y.o.  female with past medical history significant as outlined below who presented to the clinic with continuous knee pain and swelling. Patient was started on the Naproxen 250 mg twice a day and Voltaren gel which has improved her symptoms somewhat. She noted that her swelling has gone down but she noted no noted swelling in her ankles and feet. Further reports about shoulder pain, wrist pain, fatigue. She denies any fevers but instead is experiencing hot flushes. Noted weight gain. Furthermore mention that she has been experiencing some vision problems. She's currently wearing glasses and has not been seen by ophthalmologist for a couple of years.    Past Medical History  Diagnosis Date  . Hypertension   . Breast mass 02/2008     noted on mammogram March 11, 2008 3 cm simple cyst at 9 to 10:00 position of the right breast as well as multiple other smaller simple cyst, 2.6 cm mass at the 10:00 position of the right breast also representing complex cyst,  . 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 Fibroid uterus  2001     status post total abdominal hysterectomy, right and left salpingo-oophorectomy , done by Dr. Clearance Coots  . Anemia 2009     Baseline hemoglobin at 11, secondary to iron deficiency  . Deep vein thrombosis (DVT)      history of multiple  DVTs in 1989, 1989, 2004, on chronic anticoagulation with Coumadin  . PE (pulmonary embolism)      history of multiple PEs in 1984, 1989, 2004-  electronic data reviewed in [place] [facility] and EMR and I was not able to find single CT angiogram that was positive for pulmonary embolus nor was I able to  find Doppler studies that were positive for DVTs   Current Outpatient Prescriptions  Medication Sig Dispense Refill  . acetaminophen (TYLENOL) 500 MG tablet Take 1 tablet (500 mg total) by mouth every 6 (six) hours as needed for pain.  30 tablet  0  . diclofenac sodium (VOLTAREN) 1 % GEL Apply 1 application topically 3 (three) times daily as needed (Pain).  1 Tube  0  . ferrous sulfate 325 (65 FE) MG tablet Take 325 mg by mouth daily. Take with food.       . naproxen (NAPROSYN) 250 MG tablet Take 1 tablet (250 mg total) by mouth 2 (two) times daily with a meal.  14 tablet  0  . triamterene-hydrochlorothiazide (MAXZIDE-25) 37.5-25 MG per tablet Take 1 each (1 tablet total) by mouth daily.  30 tablet  11   Family History  Problem Relation Age of Onset  . Stroke Neg Hx    History   Social History  . Marital Status: Married    Spouse Name: N/A    Number of Children: N/A  . Years of Education: N/A   Social History Main Topics  . Smoking status: Never Smoker   . Smokeless tobacco: None  . Alcohol Use: No  . Drug Use: No  . Sexually Active: None   Other Topics Concern  . None   Social History Narrative  . None   Review of Systems: Constitutional: Denies  fever, chills, diaphoresis, appetite change but noted fatigue.   Respiratory: Denies SOB, DOE, cough, chest tightness,  and wheezing.   Cardiovascular: Denies chest pain, palpitations Gastrointestinal: Denies nausea, vomiting, abdominal pain, diarrhea, constipation, blood in stool and abdominal distention.  Genitourinary: Denies dysuria, urgency, frequency, hematuria, flank pain and difficulty urinating.  Musculoskeletal: Noted myalgias, back pain, joint swelling, arthralgias and gait problem due to pain in the joints.  Skin: Denies pallor, rash and wound.  Neurological: Denies dizziness light-headedness,  and headaches.    Objective:  Physical Exam: Filed Vitals:   10/01/11 1603  BP: 122/81  Pulse: 75  Temp: 98.2 F (36.8  C)  TempSrc: Oral  Height: 5\' 11"  (1.803 m)  Weight: 281 lb (127.461 kg)   Constitutional: Vital signs reviewed.  Patient is a well-developed and well-nourished,  in no acute distress and cooperative with exam. Alert and oriented x3.  Eyes: PERRL, EOMI, conjunctivae normal, No scleral icterus.  Neck: Supple,  Cardiovascular: RRR, S1 normal, S2 normal, no MRG, pulses symmetric and intact bilaterally Pulmonary/Chest: CTAB, no wheezes, rales, or rhonchi Abdominal: Soft. Non-tender, non-distended, bowel sounds are normal,  Musculoskeletal: No joint deformities, erythema, or stiffness, Decreased ROM due to pain of left knee but if distracted full ROM. Mild tenderness of palpation around the joint most in the left knee and left ankle. Swelling present of the left knee but improved.   Neurological: A&O x3, Strenght is normal and symmetric bilaterallyno focal motor deficit, sensory intact to light touch bilaterally.  Skin: Warm, dry and eczema changes noted on legs. No rash.

## 2011-10-02 DIAGNOSIS — N6009 Solitary cyst of unspecified breast: Secondary | ICD-10-CM | POA: Insufficient documentation

## 2011-10-02 LAB — SEDIMENTATION RATE: Sed Rate: 25 mm/hr — ABNORMAL HIGH (ref 0–22)

## 2011-10-02 LAB — HEPATITIS PANEL, ACUTE
HCV Ab: NEGATIVE
Hep A IgM: NEGATIVE
Hep B C IgM: NEGATIVE
Hepatitis B Surface Ag: NEGATIVE

## 2011-10-02 LAB — RHEUMATOID FACTOR: Rhuematoid fact SerPl-aCnc: <10 IU/mL (ref <=14)

## 2011-10-02 LAB — CYCLIC CITRUL PEPTIDE ANTIBODY, IGG: Cyclic Citrullin Peptide Ab: <2 U/mL (ref 0.0–5.0)

## 2011-10-02 LAB — C-REACTIVE PROTEIN: CRP: 0.72 mg/dL — ABNORMAL HIGH (ref <0.60)

## 2011-10-02 NOTE — Progress Notes (Signed)
Addended by: Almyra Deforest on: 10/02/2011 12:10 PM   Modules accepted: Orders

## 2011-10-03 LAB — HIV ANTIBODY (ROUTINE TESTING W REFLEX): HIV: NONREACTIVE

## 2011-10-03 LAB — ANA: Anti Nuclear Antibody(ANA): NEGATIVE

## 2011-10-03 LAB — URIC ACID: Uric Acid, Serum: 6.1 mg/dL (ref 2.4–7.0)

## 2011-10-05 ENCOUNTER — Other Ambulatory Visit: Payer: Self-pay | Admitting: Internal Medicine

## 2011-10-05 DIAGNOSIS — I10 Essential (primary) hypertension: Secondary | ICD-10-CM

## 2011-10-05 DIAGNOSIS — M1990 Unspecified osteoarthritis, unspecified site: Secondary | ICD-10-CM

## 2011-10-05 MED ORDER — NAPROXEN 500 MG PO TABS: 500.0000 mg | ORAL_TABLET | Freq: Two times a day (BID) | ORAL | Status: DC

## 2011-10-05 NOTE — Assessment & Plan Note (Signed)
Labs negative for inflammatory disease but patient can also have seronegative disease . The key therapy is NSAIDs. I will increase Naproxen to 500 mg bid. I would also consider to recheck uric acid during next office visit since sometimes uric acid can vary and can cause swollen joints.

## 2011-12-24 ENCOUNTER — Encounter: Payer: Self-pay | Admitting: Internal Medicine

## 2012-03-21 ENCOUNTER — Other Ambulatory Visit: Payer: Self-pay | Admitting: Internal Medicine

## 2012-04-14 ENCOUNTER — Inpatient Hospital Stay (HOSPITAL_COMMUNITY)
Admission: EM | Admit: 2012-04-14 | Discharge: 2012-04-19 | DRG: 690 | Disposition: A | Discharge status: Home / Self Care | Payer: MEDICAID | Attending: Internal Medicine | Admitting: Internal Medicine

## 2012-04-14 ENCOUNTER — Emergency Department (HOSPITAL_COMMUNITY): Payer: Self-pay

## 2012-04-14 ENCOUNTER — Encounter (HOSPITAL_COMMUNITY): Payer: Self-pay | Admitting: *Deleted

## 2012-04-14 DIAGNOSIS — K5790 Diverticulosis of intestine, part unspecified, without perforation or abscess without bleeding: Secondary | ICD-10-CM | POA: Diagnosis present

## 2012-04-14 DIAGNOSIS — I10 Essential (primary) hypertension: Secondary | ICD-10-CM | POA: Diagnosis present

## 2012-04-14 DIAGNOSIS — D638 Anemia in other chronic diseases classified elsewhere: Secondary | ICD-10-CM | POA: Diagnosis present

## 2012-04-14 DIAGNOSIS — Z86718 Personal history of other venous thrombosis and embolism: Secondary | ICD-10-CM

## 2012-04-14 DIAGNOSIS — N83202 Unspecified ovarian cyst, left side: Secondary | ICD-10-CM | POA: Diagnosis present

## 2012-04-14 DIAGNOSIS — R7989 Other specified abnormal findings of blood chemistry: Secondary | ICD-10-CM | POA: Diagnosis present

## 2012-04-14 DIAGNOSIS — E876 Hypokalemia: Secondary | ICD-10-CM | POA: Diagnosis present

## 2012-04-14 DIAGNOSIS — E785 Hyperlipidemia, unspecified: Secondary | ICD-10-CM | POA: Diagnosis present

## 2012-04-14 DIAGNOSIS — K5730 Diverticulosis of large intestine without perforation or abscess without bleeding: Secondary | ICD-10-CM | POA: Diagnosis present

## 2012-04-14 DIAGNOSIS — L259 Unspecified contact dermatitis, unspecified cause: Secondary | ICD-10-CM | POA: Diagnosis present

## 2012-04-14 DIAGNOSIS — N83209 Unspecified ovarian cyst, unspecified side: Secondary | ICD-10-CM | POA: Diagnosis present

## 2012-04-14 DIAGNOSIS — N10 Acute tubulo-interstitial nephritis: Principal | ICD-10-CM | POA: Diagnosis present

## 2012-04-14 DIAGNOSIS — A498 Other bacterial infections of unspecified site: Secondary | ICD-10-CM | POA: Diagnosis present

## 2012-04-14 DIAGNOSIS — N9489 Other specified conditions associated with female genital organs and menstrual cycle: Secondary | ICD-10-CM | POA: Diagnosis present

## 2012-04-14 DIAGNOSIS — Z79899 Other long term (current) drug therapy: Secondary | ICD-10-CM

## 2012-04-14 DIAGNOSIS — D649 Anemia, unspecified: Secondary | ICD-10-CM | POA: Diagnosis present

## 2012-04-14 DIAGNOSIS — E860 Dehydration: Secondary | ICD-10-CM | POA: Diagnosis present

## 2012-04-14 DIAGNOSIS — R509 Fever, unspecified: Secondary | ICD-10-CM | POA: Diagnosis present

## 2012-04-14 DIAGNOSIS — Z86711 Personal history of pulmonary embolism: Secondary | ICD-10-CM

## 2012-04-14 DIAGNOSIS — N12 Tubulo-interstitial nephritis, not specified as acute or chronic: Secondary | ICD-10-CM | POA: Diagnosis present

## 2012-04-14 HISTORY — DX: Diverticulosis of intestine, part unspecified, without perforation or abscess without bleeding: K57.90

## 2012-04-14 HISTORY — DX: Dermatitis, unspecified: L30.9

## 2012-04-14 LAB — CBC WITH DIFFERENTIAL/PLATELET
Basophils Absolute: 0 10*3/uL (ref 0.0–0.1)
Basophils Relative: 0 % (ref 0–1)
Eosinophils Absolute: 0 10*3/uL (ref 0.0–0.7)
Eosinophils Relative: 0 % (ref 0–5)
HCT: 35 % — ABNORMAL LOW (ref 36.0–46.0)
Hemoglobin: 12.3 g/dL (ref 12.0–15.0)
Lymphocytes Relative: 24 % (ref 12–46)
Lymphs Abs: 2.1 10*3/uL (ref 0.7–4.0)
MCH: 28.9 pg (ref 26.0–34.0)
MCHC: 35.1 g/dL (ref 30.0–36.0)
MCV: 82.2 fL (ref 78.0–100.0)
Monocytes Absolute: 1.1 10*3/uL — ABNORMAL HIGH (ref 0.1–1.0)
Monocytes Relative: 13 % — ABNORMAL HIGH (ref 3–12)
Neutro Abs: 5.7 10*3/uL (ref 1.7–7.7)
Neutrophils Relative %: 64 % (ref 43–77)
Platelets: 303 10*3/uL (ref 150–400)
RBC: 4.26 MIL/uL (ref 3.87–5.11)
RDW: 13.2 % (ref 11.5–15.5)
WBC: 9 10*3/uL (ref 4.0–10.5)

## 2012-04-14 LAB — URINALYSIS, ROUTINE W REFLEX MICROSCOPIC
Glucose, UA: NEGATIVE mg/dL
Nitrite: NEGATIVE
Protein, ur: 30 mg/dL — AB
Specific Gravity, Urine: 1.02 (ref 1.005–1.030)
Urobilinogen, UA: 4 mg/dL — ABNORMAL HIGH (ref 0.0–1.0)
pH: 6 (ref 5.0–8.0)

## 2012-04-14 LAB — COMPREHENSIVE METABOLIC PANEL
ALT: 121 U/L — ABNORMAL HIGH (ref 0–35)
AST: 87 U/L — ABNORMAL HIGH (ref 0–37)
Albumin: 3.3 g/dL — ABNORMAL LOW (ref 3.5–5.2)
Alkaline Phosphatase: 105 U/L (ref 39–117)
BUN: 11 mg/dL (ref 6–23)
CO2: 22 mEq/L (ref 19–32)
Calcium: 9.1 mg/dL (ref 8.4–10.5)
Chloride: 97 mEq/L (ref 96–112)
Creatinine, Ser: 1.15 mg/dL — ABNORMAL HIGH (ref 0.50–1.10)
GFR calc Af Amer: 62 mL/min — ABNORMAL LOW (ref >90)
GFR calc non Af Amer: 53 mL/min — ABNORMAL LOW (ref >90)
Glucose, Bld: 109 mg/dL — ABNORMAL HIGH (ref 70–99)
Potassium: 2.9 mEq/L — ABNORMAL LOW (ref 3.5–5.1)
Sodium: 131 mEq/L — ABNORMAL LOW (ref 135–145)
Total Bilirubin: 0.6 mg/dL (ref 0.3–1.2)
Total Protein: 7.5 g/dL (ref 6.0–8.3)

## 2012-04-14 LAB — URINE MICROSCOPIC-ADD ON

## 2012-04-14 LAB — LIPASE, BLOOD: Lipase: 33 U/L (ref 11–59)

## 2012-04-14 MED ORDER — ACETAMINOPHEN 325 MG PO TABS
650.0000 mg | ORAL_TABLET | Freq: Once | ORAL | Status: AC
  Administered 2012-04-14: 650 mg via ORAL
  Filled 2012-04-14: qty 2

## 2012-04-14 MED ORDER — IOHEXOL 300 MG/ML  SOLN
100.0000 mL | Freq: Once | INTRAMUSCULAR | Status: AC | PRN
  Administered 2012-04-14: 100 mL via INTRAVENOUS

## 2012-04-14 MED ORDER — SODIUM CHLORIDE 0.9 % IV BOLUS (SEPSIS)
1000.0000 mL | Freq: Once | INTRAVENOUS | Status: AC
  Administered 2012-04-14: 1000 mL via INTRAVENOUS

## 2012-04-14 MED ORDER — CEPHALEXIN 500 MG PO CAPS
500.0000 mg | ORAL_CAPSULE | Freq: Once | ORAL | Status: AC
  Administered 2012-04-14: 500 mg via ORAL
  Filled 2012-04-14: qty 1

## 2012-04-14 MED ORDER — POTASSIUM CHLORIDE 20 MEQ/15ML (10%) PO LIQD
40.0000 meq | Freq: Once | ORAL | Status: AC
  Administered 2012-04-14: 40 meq via ORAL
  Filled 2012-04-14: qty 30

## 2012-04-14 MED ORDER — HYDROMORPHONE HCL PF 1 MG/ML IJ SOLN
1.0000 mg | Freq: Once | INTRAMUSCULAR | Status: AC
  Administered 2012-04-14: 1 mg via INTRAVENOUS
  Filled 2012-04-14: qty 1

## 2012-04-14 MED ORDER — ONDANSETRON HCL 4 MG/2ML IJ SOLN
4.0000 mg | Freq: Once | INTRAMUSCULAR | Status: AC
  Administered 2012-04-14: 4 mg via INTRAVENOUS
  Filled 2012-04-14: qty 2

## 2012-04-14 NOTE — ED Notes (Signed)
Pt to CT

## 2012-04-14 NOTE — ED Provider Notes (Signed)
History     CSN: [id number]  Arrival date & time 04/14/12  1725   First MD Initiated Contact with Patient 04/14/12 1901      Chief Complaint  Patient presents with  . Abdominal Pain    (Consider location/radiation/quality/duration/timing/severity/associated sxs/prior treatment) HPI  51 y.o. female appears uncomfortable c/o epigastric pain 10/10 constant, drill like pain x4 days. Pt has had nausea but denies  vomiting, change in bowel habits, SOB or chest pain.  Pt is passing gas. She has not noticed being feverish, denies sick contacts or recent travel.   Past Medical History  Diagnosis Date  . Hypertension   . Breast mass 02/2008     Noted on mammogram March 11, 2008 3 cm simple cyst at 9 to 10:00 position of the right breast as well as multiple other smaller simple cyst, 2.6 cm mass at the 10:00 position of the right breast also representing complex cyst,  . 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 Fibroid uterus  2001     status post total abdominal hysterectomy, right and left salpingo-oophorectomy , done by Dr. Clearance Coots  . Anemia 2009     Baseline hemoglobin at 11, secondary to iron deficiency  . Deep vein thrombosis (DVT)      history of multiple  DVTs in 1989, 1989, 2004, on chronic anticoagulation with Coumadin  . PE (pulmonary embolism)      history of multiple PEs in 1984, 1989, 2004-  electronic data reviewed in [facility] [facility] and EMR and I was not able to find single CT angiogram that was positive for pulmonary embolus nor was I able to find Doppler studies that were positive for DVTs    Past Surgical History  Procedure Date  . Abdominal hysterectomy 04/13/2010    done by Dr. Clearance Coots  . 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 Zachery Dakins    Family History  Problem Relation Age of Onset  . Stroke Neg Hx     History  Substance Use Topics  . Smoking status: Never Smoker   . Smokeless tobacco: Not on file  . Alcohol Use: No    OB History    Grav Para Term Preterm Abortions TAB SAB Ect Mult Living                  Review of Systems  Gastrointestinal: Positive for abdominal pain.  All other systems reviewed and are negative.    Allergies  Review of patient's allergies indicates no known allergies.  Home Medications   Current Outpatient Rx  Name Route Sig Dispense Refill  . FERROUS SULFATE 325 (65 FE) MG PO TABS Oral Take 325 mg by mouth daily. Take with food.     Marland Kitchen NAPROXEN 500 MG PO TABS Oral Take 500 mg by mouth 2 (two) times daily with a meal.    . NAPROXEN SODIUM 220 MG PO TABS Oral Take 440 mg by mouth 2 (two) times daily with a meal.    . TRIAMTERENE-HCTZ 37.5-25 MG PO TABS Oral Take 1 tablet by mouth daily.      BP 154/81  Pulse 108  Temp 103 F (39.4 C) (Oral)  Resp 22  Ht 5\' 11"  (  1.803 m)  Wt 278 lb (126.1 kg)  BMI 38.77 kg/m2  SpO2 100%  Physical Exam  Nursing note and vitals reviewed. Constitutional: She is oriented to person, place, and time. She appears well-developed and well-nourished. No distress.       Obese  HENT:  Head: Normocephalic and atraumatic.  Eyes: Conjunctivae and EOM are normal. Pupils are equal, round, and reactive to light.  Neck: Normal range of motion.  Cardiovascular: Normal rate, regular rhythm and normal heart sounds.   Pulmonary/Chest: Effort normal and breath sounds normal. No respiratory distress. She has no wheezes. She has no rales. She exhibits no tenderness.  Abdominal: Soft. Bowel sounds are normal.  Musculoskeletal: Normal range of motion.  Neurological: She is alert and oriented to person, place, and time.  Psychiatric: She has a normal mood  and affect.    ED Course  Procedures (including critical care time)  Labs Reviewed  CBC WITH DIFFERENTIAL - Abnormal; Notable for the following:    HCT 35.0 (*)     Monocytes Relative 13 (*)     Monocytes Absolute 1.1 (*)     All other components within normal limits  COMPREHENSIVE METABOLIC PANEL - Abnormal; Notable for the following:    Sodium 131 (*)     Potassium 2.9 (*)     Glucose, Bld 109 (*)     Creatinine, Ser 1.15 (*)     Albumin 3.3 (*)     AST 87 (*)     ALT 121 (*)     GFR calc non Af Amer 53 (*)     GFR calc Af Amer 62 (*)     All other components within normal limits  LIPASE, BLOOD  URINALYSIS, ROUTINE W REFLEX MICROSCOPIC   Dg Chest 2 View  04/14/2012  *RADIOLOGY REPORT*  Clinical Data: Fever.  Abdominal pain.  CHEST - 2 VIEW  Comparison: CT and plain films of the chest 04/02/2011.  Findings: Lungs are clear.  There is cardiomegaly.  No pneumothorax or pleural fluid.  IMPRESSION: Cardiomegaly without acute disease.   Original Report Authenticated By: Bernadene Bell. Maricela Curet, M.D.     Date: 04/14/2012  Rate: 95  Rhythm: normal sinus rhythm  QRS Axis: normal  Intervals: normal  ST/T Wave abnormalities: normal  Conduction Disutrbances:none  Narrative Interpretation:   Old EKG Reviewed: unchanged    No diagnosis found.    MDM  51 y.o. c/o 10/10 epigastric pain with nausea x4 days. She is febrile to 103 here. Abdominal exam shows tenderness to mild palpation of the epigastrium. Patient had cholecystectomy in 1994.  dDx PUD Cholecystis Pancreatitis Lower lobe pneumonia  Potassium found to be 2.90 supplemented with 40 mEq by mouth. LFTs alkaline phosphatase mildly elevated. I will CT abdomen pelvis with contrast.  Care Remains with attending Dr. Effie Shy at shift change.        Wynetta Emery, PA-C 04/14/12 2118

## 2012-04-14 NOTE — ED Notes (Signed)
Pt reports pain in abdomen and back which started on Friday.  Pt reports urine is concentrated and has a strong odor.  Some nausea but no vomiting.  No dysuria.

## 2012-04-14 NOTE — ED Notes (Addendum)
Pt sts she has been having pain x 4 days. Hx of PE, prescribed coumadin in which she has not been taking for >1 year. Patient sts she is experiencing abdominal pain that shoots up her abdomen, nausea, and small amounts of vomiting. Patient temp 103.0 and she is flushed and diaphoretic. Pt sts she is having pain through to her back and up both of her thighs.

## 2012-04-14 NOTE — ED Provider Notes (Signed)
[name] is a 51 y.o. female with nausea, vomiting, and abdominal pain since yesterday. She presents with fever, but did not know she had one. She has felt warm for 2 days. No cough.  Abdomen, moderate epigastric discomfort without rebound, tenderness, and mild, left lower quadrant tenderness. Hypoactive bowel sounds  Patient's pain improved after treatment with analgesics in the ED. CT scan ordered to evaluate for colitis and complications from prior surgery.   Medical screening examination/treatment/procedure(s) were conducted as a shared visit with non-physician practitioner(s) and myself.  I personally evaluated the patient during the encounter  Laboratory evaluation reveals located in the and urinary tract infection, urinary culture is ordered. Creatinine, elevated at 1.1, somewhat higher than her baseline. CT scan is consistent with pyelonephritis. There is also nonspecific. Liver abnormality and likely left ovarian cyst.   Reevaluation: Patient remains sedated, and somewhat confused from the Dilaudid, given several hours ago. Repeat vital signs are reassuring.    Emergency department treatments. Oral potassium x2 doses oral Keflex. IV fluid boluses, x2, with maintenance fluids to follow.   Reeval: 01:05- patient feels too weak to go home. Repeat vital signs are reassuring   Plan: Admit- Hospitalist  Diagnoses #1 pyelonephritis. #2 hypokalemia . #3 dehydration     Flint Melter, MD 04/17/12 1729

## 2012-04-15 DIAGNOSIS — R509 Fever, unspecified: Secondary | ICD-10-CM | POA: Diagnosis present

## 2012-04-15 DIAGNOSIS — N12 Tubulo-interstitial nephritis, not specified as acute or chronic: Secondary | ICD-10-CM | POA: Diagnosis present

## 2012-04-15 DIAGNOSIS — N9489 Other specified conditions associated with female genital organs and menstrual cycle: Secondary | ICD-10-CM | POA: Diagnosis present

## 2012-04-15 DIAGNOSIS — E876 Hypokalemia: Secondary | ICD-10-CM | POA: Diagnosis present

## 2012-04-15 LAB — BASIC METABOLIC PANEL
BUN: 9 mg/dL (ref 6–23)
CO2: 24 mEq/L (ref 19–32)
Calcium: 8.4 mg/dL (ref 8.4–10.5)
Chloride: 104 mEq/L (ref 96–112)
Creatinine, Ser: 0.96 mg/dL (ref 0.50–1.10)
GFR calc Af Amer: 77 mL/min — ABNORMAL LOW (ref >90)
GFR calc non Af Amer: 66 mL/min — ABNORMAL LOW (ref >90)
Glucose, Bld: 131 mg/dL — ABNORMAL HIGH (ref 70–99)
Potassium: 3.2 mEq/L — ABNORMAL LOW (ref 3.5–5.1)
Sodium: 137 mEq/L (ref 135–145)

## 2012-04-15 LAB — IRON AND TIBC
Iron: 27 ug/dL — ABNORMAL LOW (ref 42–135)
Saturation Ratios: 13 % — ABNORMAL LOW (ref 20–55)
TIBC: 208 ug/dL — ABNORMAL LOW (ref 250–470)
UIBC: 181 ug/dL (ref 125–400)

## 2012-04-15 LAB — RETICULOCYTES
RBC.: 3.86 MIL/uL — ABNORMAL LOW (ref 3.87–5.11)
Retic Count, Absolute: 42.5 10*3/uL (ref 19.0–186.0)
Retic Ct Pct: 1.1 % (ref 0.4–3.1)

## 2012-04-15 LAB — HEMOGLOBIN A1C
Hgb A1c MFr Bld: 5.9 % — ABNORMAL HIGH (ref <5.7)
Mean Plasma Glucose: 123 mg/dL — ABNORMAL HIGH (ref <117)

## 2012-04-15 LAB — CBC
HCT: 32.6 % — ABNORMAL LOW (ref 36.0–46.0)
Hemoglobin: 11.2 g/dL — ABNORMAL LOW (ref 12.0–15.0)
MCH: 28.7 pg (ref 26.0–34.0)
MCHC: 34.4 g/dL (ref 30.0–36.0)
MCV: 83.6 fL (ref 78.0–100.0)
Platelets: 243 10*3/uL (ref 150–400)
RBC: 3.9 MIL/uL (ref 3.87–5.11)
RDW: 13.3 % (ref 11.5–15.5)
WBC: 6.8 10*3/uL (ref 4.0–10.5)

## 2012-04-15 LAB — FOLATE: Folate: 6.9 ng/mL

## 2012-04-15 LAB — FERRITIN: Ferritin: 414 ng/mL — ABNORMAL HIGH (ref 10–291)

## 2012-04-15 LAB — MAGNESIUM: Magnesium: 2.2 mg/dL (ref 1.5–2.5)

## 2012-04-15 LAB — POTASSIUM: Potassium: 3.5 mEq/L (ref 3.5–5.1)

## 2012-04-15 LAB — PHOSPHORUS: Phosphorus: 3 mg/dL (ref 2.3–4.6)

## 2012-04-15 LAB — VITAMIN B12: Vitamin B-12: 402 pg/mL (ref 211–911)

## 2012-04-15 MED ORDER — SODIUM CHLORIDE 0.9 % IV SOLN
INTRAVENOUS | Status: DC
  Administered 2012-04-15 – 2012-04-16 (×5): via INTRAVENOUS

## 2012-04-15 MED ORDER — ENOXAPARIN SODIUM 40 MG/0.4ML ~~LOC~~ SOLN
40.0000 mg | SUBCUTANEOUS | Status: DC
  Administered 2012-04-15 – 2012-04-19 (×5): 40 mg via SUBCUTANEOUS
  Filled 2012-04-15 (×6): qty 0.4

## 2012-04-15 MED ORDER — POTASSIUM CHLORIDE CRYS ER 20 MEQ PO TBCR
40.0000 meq | EXTENDED_RELEASE_TABLET | ORAL | Status: AC
  Administered 2012-04-15 (×2): 40 meq via ORAL
  Filled 2012-04-15 (×2): qty 2

## 2012-04-15 MED ORDER — SODIUM CHLORIDE 0.9 % IV BOLUS (SEPSIS)
1000.0000 mL | Freq: Once | INTRAVENOUS | Status: AC
  Administered 2012-04-15: 1000 mL via INTRAVENOUS

## 2012-04-15 MED ORDER — SODIUM CHLORIDE 0.9 % IV SOLN
INTRAVENOUS | Status: DC
  Administered 2012-04-15: 03:00:00 via INTRAVENOUS

## 2012-04-15 MED ORDER — MORPHINE SULFATE 2 MG/ML IJ SOLN
2.0000 mg | INTRAMUSCULAR | Status: DC | PRN
  Administered 2012-04-15 – 2012-04-16 (×4): 2 mg via INTRAVENOUS
  Filled 2012-04-15 (×5): qty 1

## 2012-04-15 MED ORDER — POTASSIUM CHLORIDE CRYS ER 20 MEQ PO TBCR
40.0000 meq | EXTENDED_RELEASE_TABLET | Freq: Once | ORAL | Status: AC
  Administered 2012-04-15: 40 meq via ORAL
  Filled 2012-04-15: qty 2

## 2012-04-15 MED ORDER — PROMETHAZINE HCL 25 MG/ML IJ SOLN
12.5000 mg | INTRAMUSCULAR | Status: DC | PRN
  Administered 2012-04-15: 12.5 mg via INTRAVENOUS
  Filled 2012-04-15: qty 1

## 2012-04-15 MED ORDER — IBUPROFEN 200 MG PO TABS
ORAL_TABLET | ORAL | Status: AC
  Administered 2012-04-15: 400 mg
  Filled 2012-04-15: qty 2

## 2012-04-15 MED ORDER — ONDANSETRON HCL 4 MG/2ML IJ SOLN
4.0000 mg | Freq: Four times a day (QID) | INTRAMUSCULAR | Status: AC | PRN
  Administered 2012-04-15 (×2): 4 mg via INTRAVENOUS
  Filled 2012-04-15 (×2): qty 2

## 2012-04-15 MED ORDER — DEXTROSE 5 % IV SOLN
2.0000 g | INTRAVENOUS | Status: DC
  Administered 2012-04-15 – 2012-04-17 (×3): 2 g via INTRAVENOUS
  Filled 2012-04-15 (×4): qty 2

## 2012-04-15 MED ORDER — TRAMADOL HCL 50 MG PO TABS
50.0000 mg | ORAL_TABLET | Freq: Four times a day (QID) | ORAL | Status: DC | PRN
  Administered 2012-04-15 – 2012-04-16 (×2): 50 mg via ORAL
  Filled 2012-04-15 (×2): qty 1

## 2012-04-15 MED ORDER — ZOLPIDEM TARTRATE 5 MG PO TABS
5.0000 mg | ORAL_TABLET | Freq: Every evening | ORAL | Status: DC | PRN
  Administered 2012-04-16 – 2012-04-18 (×2): 5 mg via ORAL
  Filled 2012-04-15 (×3): qty 1

## 2012-04-15 MED ORDER — KETOROLAC TROMETHAMINE 30 MG/ML IJ SOLN: 30.0000 mg | Freq: Once | INTRAMUSCULAR | Status: DC

## 2012-04-15 MED ORDER — ACETAMINOPHEN 325 MG PO TABS
650.0000 mg | ORAL_TABLET | ORAL | Status: DC | PRN
  Administered 2012-04-15 – 2012-04-16 (×3): 650 mg via ORAL
  Filled 2012-04-15 (×3): qty 2

## 2012-04-15 MED ORDER — ONDANSETRON HCL 4 MG/2ML IJ SOLN: 4.0000 mg | Freq: Three times a day (TID) | INTRAMUSCULAR | Status: DC | PRN

## 2012-04-15 NOTE — H&P (Signed)
Internal Medicine Attending Admission Note Date: 04/15/2012  Patient name: [name] Medical record number: [id number] Date of birth: 02/05/1961 Age: 51 y.o. Gender: female  I saw and evaluated the patient and discussed her care with house. I reviewed the resident's note and I agree with the resident's findings and plan as documented in the resident's note, with the following additional comments.  Chief Complaint(s): Abdominal pain  History - key components related to admission: Patient is a 50 year old woman with history of hypertension, recurrent DVT/PE, iron deficiency anemia, and other problems as outlined in medical history admitted with complaint of mid abdominal pain that began 4 days prior to admission.  She reports associated nausea and back pain.  She denies dysuria or urinary frequency.  She denies emesis, bright red blood per rectum, or melena  Physical Exam - key components related to admission:  Temp (24hrs), Avg:98.6 F (37 C), Min:97.4 F (36.3 C), Max:103 F (39.4 C)   Filed Vitals:   04/15/12 0645 04/15/12 0844 04/15/12 0845 04/15/12 0846  BP: 145/92 104/55 123/74 115/70  Pulse: 71 64 66 68  Temp: 98 F (36.7 C)     TempSrc: Oral     Resp: 20 18 18 18   Height:      Weight:      SpO2: 100%      General: Alert, oriented Lungs: Clear Heart: Regular; S1-S2, no S3, no S4, no murmurs Back: Right CVA tenderness Abdomen: Bowel sounds present, soft; moderate upper abdominal tenderness; no rebound; no apparent hepatosplenomegaly Extremities: No edema; no calf tenderness  Lab results:   Basic Metabolic Panel:  Basename 04/15/12 0915 04/15/12 0908 04/15/12 0430 04/14/12 1945  NA -- -- 137 131*  K 3.5 -- 3.2* --  CL -- -- 104 97  CO2 -- -- 24 22  GLUCOSE -- -- 131* 109*  BUN -- -- 9 11  CREATININE -- -- 0.96 1.15*  CALCIUM -- -- 8.4 9.1  MG -- 2.2 -- --  PHOS -- 3.0 -- --   Liver Function Tests:  [facility] 04/14/12 1945  AST 87*  ALT 121*    ALKPHOS 105  BILITOT 0.6  PROT 7.5  ALBUMIN 3.3*    Basename 04/14/12 1945  LIPASE 33  AMYLASE --    CBC:  Basename 04/15/12 0435 04/14/12 1945  WBC 6.8 9.0  NEUTROABS -- 5.7  HGB 11.2* 12.3  HCT 32.6* 35.0*  MCV 83.6 82.2  PLT 243 303    Hemoglobin A1C:  Basename 04/15/12 0430  HGBA1C 5.9*    Anemia Panel:  Basename 04/15/12 0908  VITAMINB12 --  FOLATE --  FERRITIN --  TIBC --  IRON --  RETICCTPCT 1.1   Urinalysis    Component Value Date/Time   COLORURINE AMBER* 04/14/2012 2050   APPEARANCEUR CLOUDY* 04/14/2012 2050   LABSPEC 1.020 04/14/2012 2050   PHURINE 6.0 04/14/2012 2050   GLUCOSEU NEGATIVE 04/14/2012 2050   HGBUR LARGE* 04/14/2012 2050   BILIRUBINUR SMALL* 04/14/2012 2050   KETONESUR TRACE* 04/14/2012 2050   PROTEINUR 30* 04/14/2012 2050   UROBILINOGEN 4.0* 04/14/2012 2050   NITRITE NEGATIVE 04/14/2012 2050   LEUKOCYTESUR LARGE* 04/14/2012 2050   Urine microscopic: WBCs too numerous to count, RBC field obscured by WBCs, many squamous epithelial, many bacteria    Imaging results:  Dg Chest 2 View  04/14/2012  *RADIOLOGY REPORT*  Clinical Data: Fever.  Abdominal pain.  CHEST - 2 VIEW  Comparison: CT and plain films of the chest 04/02/2011.  Findings: Lungs  are clear.  There is cardiomegaly.  No pneumothorax or pleural fluid.  IMPRESSION: Cardiomegaly without acute disease.   Original Report Authenticated By: Bernadene Bell. Maricela Curet, M.D.    Ct Abdomen Pelvis W Contrast  04/14/2012  *RADIOLOGY REPORT*  Clinical Data: Abdominal pain  CT ABDOMEN AND PELVIS WITH CONTRAST  Technique:  Multidetector CT imaging of the abdomen and pelvis was performed following the standard protocol during bolus administration of intravenous contrast.  Contrast: OMNIPAQUE IOHEXOL 300 MG/ML  SOLN  Comparison: 10/10/2008  Findings: Mild bibasilar scarring or atelectasis.  Low attenuation of the liver is nonspecific post contrast however suggests fatty infiltration.  No focal lesion  identified in the absent gallbladder.  No biliary ductal dilatation.  Unremarkable spleen with the exception of a punctate calcification. Unremarkable pancreas and adrenal glands.  Lobular renal contours with heterogeneous/striated enhancement.  No hydronephrosis or hydroureter.  No bowel obstruction.  No CT evidence for colitis.  Colonic diverticulosis.  Normal appendix.  No free intraperitoneal air or fluid.  The normal caliber vasculature.  Thin-walled bladder. Nonspecific 6.5 cm left adnexal cyst.  Absent uterus.  No pelvic adenopathy.  Lower thoracic and L3-4 degenerative changes.  No acute osseous finding.  IMPRESSION: Striated renal enhancement is a pattern that can be seen with pyelonephritis (favored) or infarctions. No abscess.  Low attenuation of the liver is nonspecific post contrast however suggests steatosis.  Nonspecific left adnexal/ovarian cyst measures up to 6.5 cm. Recommend a non emergent pelvic ultrasound follow-up.   Original Report Authenticated By: Waneta Martins, M.D.     Other results: EKG:  Normal sinus rhythm  Assessment & Plan by Problem:  1.  Pyelonephritis.  Patient presents with abdominal pain, nausea, fever to 103F, pyuria/bacteria on urinalysis, and CT findings suggesting pyelonephritis.  Plan is empiric IV ceftriaxone pending results of blood and urine cultures; symptomatic treatment for pain and nausea; maintenance IV fluid.  2.  Left adnexal/ovarian cyst.  CT shows a nonspecific left adnexal/ovarian cyst measuring 6.5 cm.  The plan is pelvic ultrasound once patient symptoms have improved.  3.  History of recurrent venous thromboembolism.  According to chart, patient was on chronic anti-coagulation in the past with warfarin, but decided against continuing warfarin in 2011.  It is unclear whether a factor Xa inhibitor such as rivaroxaban has been considered.  Would discuss this with patient's PCP and with Dr. Alexandria Lodge in the clinic;  if it hasn't been addressed with  patient, then we could do this prior to discharge.    Principal Problem:  *Pyelonephritis Active Problems:  HYPERTENSION  PULMONARY EMBOLISM, HX OF  DVT (deep venous thrombosis)  Fever  Hypokalemia  Normocytic anemia  Adnexal mass

## 2012-04-15 NOTE — Progress Notes (Signed)
ANTIBIOTIC CONSULT NOTE - INITIAL  Pharmacy Consult for ceftriaxone Indication: Possible pyelonephritis  No Known Allergies  Patient Measurements: Height: 5\' 11"  (180.3 cm) Weight: 278 lb (126.1 kg) IBW/kg (Calculated) : 70.8   Vital Signs: Temp: 98.2 F (36.8 C) (08/27 0315) Temp src: Oral (08/27 0315) BP: 120/68 mmHg (08/27 0315) Pulse Rate: 63  (08/27 0315) Intake/Output from previous day:   Intake/Output from this shift:    Labs:  [facility] 04/14/12 1945  WBC 9.0  HGB 12.3  PLT 303  LABCREA --  CREATININE 1.15*   Estimated Creatinine Clearance: 83 ml/min (by C-G formula based on Cr of 1.15). No results found for this basename: VANCOTROUGH:2,VANCOPEAK:2,VANCORANDOM:2,GENTTROUGH:2,GENTPEAK:2,GENTRANDOM:2,TOBRATROUGH:2,TOBRAPEAK:2,TOBRARND:2,AMIKACINPEAK:2,AMIKACINTROU:2,AMIKACIN:2, in the last 72 hours   Microbiology: No results found for this or any previous visit (from the past 720 hour(s)).  Medical History: Past Medical History  Diagnosis Date  . Hypertension   . Breast mass 02/2008     Noted on mammogram March 11, 2008 3 cm simple cyst at 9 to 10:00 position of the right breast as well as multiple other smaller simple cyst, 2.6 cm mass at the 10:00 position of the right breast also representing complex cyst,  . 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 Fibroid uterus  2001     status post total abdominal hysterectomy, right and left salpingo-oophorectomy , done by Dr. Clearance Coots  . Anemia 2009     Baseline hemoglobin at 11, secondary to iron deficiency  . Deep vein thrombosis (DVT)      history of multiple  DVTs in 1989, 1989, 2004, on chronic anticoagulation with Coumadin  . PE (pulmonary embolism)      history of multiple PEs in  1984, 1989, 2004-  electronic data reviewed in [facility] and EMR and I was not able to find single CT angiogram that was positive for pulmonary embolus nor was I able to find Doppler studies that were positive for DVTs    Medications:  Scheduled:    . sodium chloride   Intravenous STAT  . acetaminophen  650 mg Oral Once  . cephALEXin  500 mg Oral Once  . enoxaparin (LOVENOX) injection  40 mg Subcutaneous Q24H  .  HYDROmorphone (DILAUDID) injection  1 mg Intravenous Once  . ibuprofen      . ondansetron (ZOFRAN) IV  4 mg Intravenous Once  . potassium chloride  40 mEq Oral Once  . potassium chloride SA  40 mEq Oral Once  . sodium chloride  1,000 mL Intravenous Once  . sodium chloride  1,000 mL Intravenous Once   Assessment: 51 yo female admitted with possible pyelonephritis. Pharmacy consulted to manage ceftriaxone.   Plan:  1. Ceftriaxone 2gm IV Q24H. 2. Pharmacy will sign-off as ceftriaxone does not require renal dose adjustment.   Emeline Gins 04/15/2012,4:01 AM

## 2012-04-15 NOTE — Progress Notes (Signed)
Subjective: Pt c/o 6/10 shooting central ab pain associated with back pain; morphine 2 mg helps for pain but she does not want Dilaudid; she feels her stomach is upset; weak; nauseated; lightheaded; she mentions she feels horrible. She reports always feeling cold; she reports dark urine; denies dysuria, reports her urine was tea colored prior to admission.  She reports a family history of blood clots on both sides of her family.  She has a history of blood clots as well and was on Coumadin via [facility] from 2004-2011.  Last clot was in 2004 and denies OCP use at that time.  She takes Triamterinene-HCTZ 37.5-25 mg for HTN. Pt denies smoking cigarettes.  Pt reports h/o asthma   Objective: Vital signs in last 24 hours: Filed Vitals:   04/15/12 0645 04/15/12 0844 04/15/12 0845 04/15/12 0846  BP: 145/92 104/55 123/74 115/70  Pulse: 71 64 66 68  Temp: 98 F (36.7 C)     TempSrc: Oral     Resp: 20 18 18 18   Height:      Weight:      SpO2: 100%      Weight change:   Intake/Output Summary (Last 24 hours) at 04/15/12 1123 Last data filed at 04/15/12 0846  Gross per 24 hour  Intake    360 ml  Output      0 ml  Net    360 ml   Vitals reviewed. General: resting in be nauseated and retching, moaning in pain HEENT: no scleral icterus Cardiac: RRR, no rubs, murmurs or gallops Pulm: clear to auscultation bilaterally, no wheezes, rales, or rhonchi Abd: soft, obese, mod TTP LUQ but also min ttp other quadrants, +b/l CVA ttp right>left, nondistended, BS present (normal) Ext: warm and well perfused, no pedal edema Skin: eczematous patches to trunk, extremities  Neuro: alert and oriented X3 Lab Results: Basic Metabolic Panel:  Lab 04/15/12 1610 04/15/12 0908 04/15/12 0430 04/14/12 1945  NA -- -- 137 131*  K 3.5 -- 3.2* --  CL -- -- 104 97  CO2 -- -- 24 22  GLUCOSE -- -- 131* 109*  BUN -- -- 9 11  CREATININE -- -- 0.96 1.15*  CALCIUM -- -- 8.4 9.1  MG -- 2.2 -- --  PHOS -- 3.0 -- --    Liver Function Tests:  Lab 04/14/12 1945  AST 87*  ALT 121*  ALKPHOS 105  BILITOT 0.6  PROT 7.5  ALBUMIN 3.3*    Lab 04/14/12 1945  LIPASE 33  AMYLASE --   CBC:  Lab 04/15/12 0435 04/14/12 1945  WBC 6.8 9.0  NEUTROABS -- 5.7  HGB 11.2* 12.3  HCT 32.6* 35.0*  MCV 83.6 82.2  PLT 243 303   Hemoglobin A1C:  Lab 04/15/12 0430  HGBA1C 5.9*   Anemia Panel:  Lab 04/15/12 0908  VITAMINB12 --  FOLATE --  FERRITIN --  TIBC --  IRON --  RETICCTPCT 1.1   Urinalysis:  Lab 04/14/12 2050  COLORURINE AMBER*  LABSPEC 1.020  PHURINE 6.0  GLUCOSEU NEGATIVE  HGBUR LARGE*  BILIRUBINUR SMALL*  KETONESUR TRACE*  PROTEINUR 30*  UROBILINOGEN 4.0*  NITRITE NEGATIVE  LEUKOCYTESUR LARGE*   Misc. Labs:  pending HA1C, blood and urine cxs  Micro Results:pendnig  Studies/Results: Dg Chest 2 View  04/14/2012  *RADIOLOGY REPORT*  Clinical Data: Fever.  Abdominal pain.  CHEST - 2 VIEW  Comparison: CT and plain films of the chest 04/02/2011.  Findings: Lungs are clear.  There is cardiomegaly.  No  pneumothorax or pleural fluid.  IMPRESSION: Cardiomegaly without acute disease.   Original Report Authenticated By: Bernadene Bell. Maricela Curet, M.D.    Ct Abdomen Pelvis W Contrast  04/14/2012  *RADIOLOGY REPORT*  Clinical Data: Abdominal pain  CT ABDOMEN AND PELVIS WITH CONTRAST  Technique:  Multidetector CT imaging of the abdomen and pelvis was performed following the standard protocol during bolus administration of intravenous contrast.  Contrast: OMNIPAQUE IOHEXOL 300 MG/ML  SOLN  Comparison: 10/10/2008  Findings: Mild bibasilar scarring or atelectasis.  Low attenuation of the liver is nonspecific post contrast however suggests fatty infiltration.  No focal lesion identified in the absent gallbladder.  No biliary ductal dilatation.  Unremarkable spleen with the exception of a punctate calcification. Unremarkable pancreas and adrenal glands.  Lobular renal contours with  heterogeneous/striated enhancement.  No hydronephrosis or hydroureter.  No bowel obstruction.  No CT evidence for colitis.  Colonic diverticulosis.  Normal appendix.  No free intraperitoneal air or fluid.  The normal caliber vasculature.  Thin-walled bladder. Nonspecific 6.5 cm left adnexal cyst.  Absent uterus.  No pelvic adenopathy.  Lower thoracic and L3-4 degenerative changes.  No acute osseous finding.  IMPRESSION: Striated renal enhancement is a pattern that can be seen with pyelonephritis (favored) or infarctions. No abscess.  Low attenuation of the liver is nonspecific post contrast however suggests steatosis.  Nonspecific left adnexal/ovarian cyst measures up to 6.5 cm. Recommend a non emergent pelvic ultrasound follow-up.   Original Report Authenticated By: Waneta Martins, M.D.    Medications: Scheduled Meds:    . acetaminophen  650 mg Oral Once  . cefTRIAXone (ROCEPHIN)  IV  2 g Intravenous Q24H  . cephALEXin  500 mg Oral Once  . enoxaparin (LOVENOX) injection  40 mg Subcutaneous Q24H  .  HYDROmorphone (DILAUDID) injection  1 mg Intravenous Once  . ibuprofen      . ondansetron (ZOFRAN) IV  4 mg Intravenous Once  . potassium chloride  40 mEq Oral Once  . potassium chloride SA  40 mEq Oral Once  . potassium chloride SA  40 mEq Oral Q4H  . sodium chloride  1,000 mL Intravenous Once  . sodium chloride  1,000 mL Intravenous Once  . DISCONTD: sodium chloride   Intravenous STAT  . DISCONTD: ketorolac  30 mg Intravenous Once   Continuous Infusions:    . sodium chloride 125 mL/hr at 04/15/12 0052   PRN Meds:.acetaminophen, iohexol, morphine injection, ondansetron (ZOFRAN) IV, traMADol, DISCONTD: ondansetron (ZOFRAN) IV Assessment/Plan: 51 y.o woman PMH HTN, phyllodes tumor left breast, fibroid uterus s/p hysterectomy, anemia, multiple PE/DVT who presented 8/26 with abdominal pain, fever.    1. Acute Pyelonephritis  -Spoke with radiologist who stated CT shows evidence of right but  also possible minimal left pyelo -pain control morphine 2 mg q4, Ultram 50 mg q6, Tylenol 650 mg q 4  -fever resolved-prn Tylenol -will monitor cbc  -Abx Rocephin started 8/27 -pending blood and urine Cxs   2. H/o HYPERTENSION   -will monitor bp  -not currently on med   3. PULMONARY EMBOLISM, HX OF/ H/O DVT (deep venous thrombosis) -pt refuses Coumadin on previously from 2004-2011 last clot was in 2004 -pt has had hysterectomy and no association of clots with OCP -she may need hypercoaguable w/u with sign PMH and FH thromboembolic events   4. Anemia -pending anemia panel   5. Adnexal mass -pending US pelvis 8/30  6. F/E/N -NS 125 cc/hr -will monitor and replace electrolytes (K low on admisison with  replacement still low 3.2); will repeat K at 11 am today  -diet ordered-pt will try this diet but if not tolerating requesting change   7. Dispo -home when w/u complete    LOS: 1 day   Annett Gula 04/15/2012, 11:23 AM

## 2012-04-15 NOTE — H&P (Signed)
Date: 04/15/2012               Patient Name:  [name] MRN: [id number]  DOB: 1960-12-14 Age / Sex: 51 y.o., female   PCP: Almyra Deforest              Medical Service: Internal Medicine Teaching Service              Attending Physician: Dr. Farley Ly, MD    First Contact: Dr. Desma Maxim Pager: [phone]  Second Contact: Dr. Bard Herbert Pager: [phone]            After Hours (After 5p/  First Contact Pager: [phone]  weekends / holidays): Second Contact Pager: [phone]     Chief Complaint: Abdominal pain  History of Present Illness: Patient is a 51 y.o. female with a PMHx of HTN, recurrent DVT/PE, who presented to [facility] ED for evaluation of abdominal pain, and was subsequently transferred to [facility] for suspected pyelonephritis.   Ms. Deike states her abdominal pain began on 04/11/2012, was epigastric and radiated to her back, and was 10/10 in severity. She describes the pain as being both dull and at at times sharp. She states the pain has been more or less constant since that time, causing her significant nausea which has prevented her from eating or drinking regularly, but denies any vomiting. She states nothing has improved the pain, and has tried naproxen and another pain medication at home (unsure of name). Patient denies changes in bowel habits.   Evaluation in the [facility] ED included a UA which revealed pyuria, bacteruria, and hematuria, as well as a CT abdomen which revealed findings suggestive of pyelonephritis. Patient was subsequently transferred to [facility] for admission by IMTS as she is an IMTS clinic patient.   Review of Systems: Constitutional:  denies fever. Admits to chills, changes in appetite.  HEENT: admits to headache.  Respiratory: denies SOB, cough  Cardiovascular: denies chest pain, palpitations and leg swelling.  Gastrointestinal: admits to nausea. Denies vomiting, abdominal pain, diarrhea, constipation, blood in stool.  Genitourinary: admits to  flank pain, malodorous and tea-colored urine. Denies dysuria, urgency, frequency, hematuria.   Musculoskeletal: denies  myalgias, back pain, joint swelling, arthralgias and gait problem.   Skin: admits to psoriatic lesions on legs.  Neurological: admits to dizziness, light-headedness.  Hematological: admits to predisposition to blood clots   Current Outpatient Medications: Medication Sig  . ferrous sulfate 325 (65 FE) MG tablet Take 325 mg by mouth daily. Take with food.   . naproxen (NAPROSYN) 500 MG tablet Take 500 mg by mouth 2 (two) times daily with a meal.  . naproxen sodium (ANAPROX) 220 MG tablet Take 440 mg by mouth 2 (two) times daily with a meal.  . triamterene-hydrochlorothiazide (MAXZIDE-25) 37.5-25 MG per tablet Take 1 tablet by mouth daily.   Allergies: No Known Allergies   Past Medical History: Past Medical History  Diagnosis Date  . Hypertension   . Breast mass 02/2008     Noted on mammogram March 11, 2008 3 cm simple cyst at 9 to 10:00 position of the right breast as well as multiple other smaller simple cyst, 2.6 cm mass at the 10:00 position of the right breast also representing complex cyst,  . 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 Fibroid uterus  2001     status post total abdominal hysterectomy, right and left salpingo-oophorectomy , done by Dr. Clearance Coots  . Anemia 2009     Baseline hemoglobin at 11, secondary to iron deficiency  . Deep vein thrombosis (DVT)      history of multiple  DVTs in 1989, 1989, 2004, on chronic anticoagulation with Coumadin  . PE (pulmonary embolism)      history of multiple PEs in 1984, 1989, 2004-  electronic data reviewed in [place] and EMR and I was not able to find single CT angiogram that was positive  for pulmonary embolus nor was I able to find Doppler studies that were positive for DVTs    Past Surgical History: Past Surgical History  Procedure Date  . Abdominal hysterectomy 04/13/2010    done by Dr. Clearance Coots  . Mastectomy, partial 03/2006     left partial mastectomy secondary to fibrocystic disease intraluminal fibroadenoma performed March 28, 2006 done by Dr. Zachery Dakins  . Cholecystectomy     1997    Family History: Family History  Problem Relation Age of Onset  . Stroke Neg Hx     Social History: History   Social History  . Marital Status: Married    Spouse Name: N/A    Number of Children: N/A  . Years of Education: N/A   Occupational History  . Not on file.   Social History Main Topics  . Smoking status: Never Smoker   . Smokeless tobacco: Not on file  . Alcohol Use: No  . Drug Use: No  . Sexually Active: Not on file   Other Topics Concern  . Not on file   Social History Narrative  . No narrative on file     Vital Signs: Blood pressure 120/68, pulse 63, temperature 98.2 F (36.8 C), temperature source Oral, resp. rate 19, height 5\' 11"  (1.803 m), weight 278 lb (126.1 kg), SpO2 98.00%.  Physical Exam: General: Vital signs reviewed and noted. Well-developed, well-nourished, in mild distress secondary to pain.  Head: Normocephalic, atraumatic.  Eyes: PERRL, EOMI, No signs of anemia or jaundice.  Nose: Mucous membranes moist, not inflammed, nonerythematous.  Throat: Oropharynx nonerythematous, no exudate appreciated.   Neck: No deformities, masses, or tenderness noted. Supple.  Lungs:  Normal respiratory effort. Clear to auscultation BL without crackles or wheezes.  Heart: RRR. S1 and S2 normal.  Abdomen:  Tender to palpation of left abdomen, with rebound tenderness.  Flank tenderness on right. BS normoactive.   Extremities: Trace, symmetric pretibial edema bilaterally.  Skin: Scaling, non-erythematous lesions present in multiple areas, consistent with  psoriasis.   Lab results: Basic Metabolic Panel:  [facility] 04/14/12 1945  NA 131*  K 2.9*  CL 97  CO2 22  GLUCOSE 109*  BUN 11  CREATININE 1.15*  CALCIUM 9.1  MG --  PHOS --   Liver Function Tests:  [facility] 04/14/12 1945  AST 87*  ALT 121*  ALKPHOS 105  BILITOT 0.6  PROT 7.5  ALBUMIN 3.3*    Basename 04/14/12 1945  LIPASE 33  AMYLASE --   CBC:  Basename 04/14/12 1945  WBC 9.0  NEUTROABS 5.7  HGB 12.3  HCT 35.0*  MCV 82.2  PLT 303   Urine Drug Screen: Drugs of Abuse     Component Value Date/Time   LABOPIA NONE DETECTED 02/14/2007 0224   COCAINSCRNUR NONE DETECTED 02/14/2007 0224   LABBENZ NONE DETECTED 02/14/2007 0224   AMPHETMU NONE DETECTED 02/14/2007 0224  THCU NONE DETECTED 02/14/2007 0224   LABBARB  Value: NONE DETECTED        DRUG SCREEN FOR MEDICAL PURPOSES ONLY.  IF CONFIRMATION IS NEEDED FOR ANY PURPOSE, NOTIFY LAB WITHIN 5 DAYS. 02/14/2007 0224    Urinalysis:  Basename 04/14/12 2050  COLORURINE AMBER*  LABSPEC 1.020  PHURINE 6.0  GLUCOSEU NEGATIVE  HGBUR LARGE*  BILIRUBINUR SMALL*  KETONESUR TRACE*  PROTEINUR 30*  UROBILINOGEN 4.0*  NITRITE NEGATIVE  LEUKOCYTESUR LARGE*   Imaging results:  Dg Chest 2 View  04/14/2012  *RADIOLOGY REPORT*  Clinical Data: Fever.  Abdominal pain.  CHEST - 2 VIEW  Comparison: CT and plain films of the chest 04/02/2011.  Findings: Lungs are clear.  There is cardiomegaly.  No pneumothorax or pleural fluid.  IMPRESSION: Cardiomegaly without acute disease.   Original Report Authenticated By: Bernadene Bell. Maricela Curet, M.D.    Ct Abdomen Pelvis W Contrast  04/14/2012  *RADIOLOGY REPORT*  Clinical Data: Abdominal pain  CT ABDOMEN AND PELVIS WITH CONTRAST  Technique:  Multidetector CT imaging of the abdomen and pelvis was performed following the standard protocol during bolus administration of intravenous contrast.  Contrast: OMNIPAQUE IOHEXOL 300 MG/ML  SOLN  Comparison: 10/10/2008  Findings: Mild bibasilar scarring  or atelectasis.  Low attenuation of the liver is nonspecific post contrast however suggests fatty infiltration.  No focal lesion identified in the absent gallbladder.  No biliary ductal dilatation.  Unremarkable spleen with the exception of a punctate calcification. Unremarkable pancreas and adrenal glands.  Lobular renal contours with heterogeneous/striated enhancement.  No hydronephrosis or hydroureter.  No bowel obstruction.  No CT evidence for colitis.  Colonic diverticulosis.  Normal appendix.  No free intraperitoneal air or fluid.  The normal caliber vasculature.  Thin-walled bladder. Nonspecific 6.5 cm left adnexal cyst.  Absent uterus.  No pelvic adenopathy.  Lower thoracic and L3-4 degenerative changes.  No acute osseous finding.  IMPRESSION: Striated renal enhancement is a pattern that can be seen with pyelonephritis (favored) or infarctions. No abscess.  Low attenuation of the liver is nonspecific post contrast however suggests steatosis.  Nonspecific left adnexal/ovarian cyst measures up to 6.5 cm. Recommend a non emergent pelvic ultrasound follow-up.   Original Report Authenticated By: Waneta Martins, M.D.      Other results:  EKG (04/15/2012) - Normal Sinus Rhythm, regular rate of approximately 100 bpm, normal axis, ST segments: normal.     Assessment & Plan: Pt is a 51 y.o. yo female with a PMHx of HTN,recurrent DVT/PE, who was admitted on 04/14/2012 with symptoms of abdominal pain, which was determined to be secondary to probable pyelonephritis. Interventions at this time will be focused on treating the patient's infection, supportive care for symptoms of pain and nausea, and further investigating the underlying etiology of the patient's illness.  Abdominal pain - Likely acute, uncomplicated pyelonephritis. CT abdomen/pelvis revealed areas in right kidney (?possibly left also) concerning for pyelonephritis, versus possible infarcts (deemed less likely). Urinalysis revealed pyuria,  bacteruria, and large hematuria. Although patient denies dysuria, she complains of tea-colored, malodorous urine for several days prior to admission. Patient was febrile (103F) at initial presentation at [facility]. These findings are most consistent with acute, uncomplicated pyelonephritis (no recent hospitalization, no foley catheter, no DM). Although infarcts are possible given patient's predisposition for development of thrombosis, this seems less likely given UA findings suggestive of infection and in presence of fever.  - admit to floor - monitor vitals - ceftriaxone IV - IVF rehydration @ 125cc/hr -  tylenol PRN - tramadol PRN - morphine PRN - check HbA1c (as DM can contribute to pyelonephritis) - check blood cultures - urine culture pending  Hx of DVT/PE - Patient has had at least three pulmonary emboli in the past. She was on coumadin previously, but decided she no longer wished to continue to take this medication as it made her feel fatigued and she was overwhelmed by the amount of follow-up in coumadin clinic that this medication required. A discussion of other options revealed she might be amenable to trying an agent which required less regularly follow-up if it was deemed an appropriate option for her. - consider options for long-term DVT ppx prior to d/c  Adnexal mass - On CT/abdomen, there was a nonspecific left adnexal/ovarian cyst measures up to 6.5 cm. Radiology recommended a non-emergent pelvic ultrasound follow-up - pelvic ultrasound  Iron-deficiency anemia - Not anemic on presentation (Hgb = 12.3; likely with mild hemoconcentration). - holding iron in setting of nausea/abdominal pain  Hypertension - BPs were low-normal at time of admission. Given likely volume depletion, patient's home BP meds should be held at this time. - hold triamterene-HCTZ  DVT PPX - lovenox  Diet - heart-healthy    Signed: Elenor Legato, M.D. PGY-I, Internal Medicine Resident Pager: [phone]  (7AM-5PM) 04/15/2012, 3:54 AM

## 2012-04-15 NOTE — ED Notes (Signed)
Pt report called to [facility] and to [facility]

## 2012-04-15 NOTE — Progress Notes (Signed)
UR COMPLETED  

## 2012-04-16 ENCOUNTER — Inpatient Hospital Stay (HOSPITAL_COMMUNITY): Payer: MEDICAID

## 2012-04-16 DIAGNOSIS — E785 Hyperlipidemia, unspecified: Secondary | ICD-10-CM

## 2012-04-16 DIAGNOSIS — R7401 Elevation of levels of liver transaminase levels: Secondary | ICD-10-CM

## 2012-04-16 DIAGNOSIS — N83202 Unspecified ovarian cyst, left side: Secondary | ICD-10-CM | POA: Diagnosis present

## 2012-04-16 LAB — CBC WITH DIFFERENTIAL/PLATELET
Basophils Absolute: 0 10*3/uL (ref 0.0–0.1)
Basophils Relative: 0 % (ref 0–1)
Eosinophils Absolute: 0 10*3/uL (ref 0.0–0.7)
Eosinophils Relative: 1 % (ref 0–5)
HCT: 32.6 % — ABNORMAL LOW (ref 36.0–46.0)
Hemoglobin: 11.2 g/dL — ABNORMAL LOW (ref 12.0–15.0)
Lymphocytes Relative: 32 % (ref 12–46)
Lymphs Abs: 2 10*3/uL (ref 0.7–4.0)
MCH: 29 pg (ref 26.0–34.0)
MCHC: 34.4 g/dL (ref 30.0–36.0)
MCV: 84.5 fL (ref 78.0–100.0)
Monocytes Absolute: 0.8 10*3/uL (ref 0.1–1.0)
Monocytes Relative: 14 % — ABNORMAL HIGH (ref 3–12)
Neutro Abs: 3.3 10*3/uL (ref 1.7–7.7)
Neutrophils Relative %: 54 % (ref 43–77)
Platelets: 276 10*3/uL (ref 150–400)
RBC: 3.86 MIL/uL — ABNORMAL LOW (ref 3.87–5.11)
RDW: 13.3 % (ref 11.5–15.5)
WBC: 6.2 10*3/uL (ref 4.0–10.5)

## 2012-04-16 LAB — LIPID PANEL
Cholesterol: 184 mg/dL (ref 0–200)
HDL: 38 mg/dL — ABNORMAL LOW (ref >39)
LDL Cholesterol: 130 mg/dL — ABNORMAL HIGH (ref 0–99)
Total CHOL/HDL Ratio: 4.8 RATIO
Triglycerides: 79 mg/dL (ref <150)
VLDL: 16 mg/dL (ref 0–40)

## 2012-04-16 LAB — COMPREHENSIVE METABOLIC PANEL
ALT: 149 U/L — ABNORMAL HIGH (ref 0–35)
AST: 78 U/L — ABNORMAL HIGH (ref 0–37)
Albumin: 2.8 g/dL — ABNORMAL LOW (ref 3.5–5.2)
Alkaline Phosphatase: 115 U/L (ref 39–117)
BUN: 7 mg/dL (ref 6–23)
CO2: 22 mEq/L (ref 19–32)
Calcium: 8.8 mg/dL (ref 8.4–10.5)
Chloride: 105 mEq/L (ref 96–112)
Creatinine, Ser: 0.92 mg/dL (ref 0.50–1.10)
GFR calc Af Amer: 81 mL/min — ABNORMAL LOW (ref >90)
GFR calc non Af Amer: 70 mL/min — ABNORMAL LOW (ref >90)
Glucose, Bld: 93 mg/dL (ref 70–99)
Potassium: 3.5 mEq/L (ref 3.5–5.1)
Sodium: 139 mEq/L (ref 135–145)
Total Bilirubin: 0.3 mg/dL (ref 0.3–1.2)
Total Protein: 6.8 g/dL (ref 6.0–8.3)

## 2012-04-16 LAB — URINE CULTURE: Colony Count: >=100000

## 2012-04-16 LAB — HEPATITIS PANEL, ACUTE
HCV Ab: NEGATIVE
Hep A IgM: NEGATIVE
Hep B C IgM: NEGATIVE
Hepatitis B Surface Ag: NEGATIVE

## 2012-04-16 LAB — TSH: TSH: 1.43 u[IU]/mL (ref 0.350–4.500)

## 2012-04-16 MED ORDER — HYDROCORTISONE VALERATE 0.2 % EX CREA
TOPICAL_CREAM | Freq: Two times a day (BID) | CUTANEOUS | Status: DC
  Administered 2012-04-17 – 2012-04-19 (×4): via TOPICAL
  Filled 2012-04-16: qty 15

## 2012-04-16 MED ORDER — SODIUM CHLORIDE 0.9 % IV SOLN
1250.0000 mg | Freq: Two times a day (BID) | INTRAVENOUS | Status: DC
  Administered 2012-04-16 – 2012-04-17 (×2): 1250 mg via INTRAVENOUS
  Filled 2012-04-16 (×4): qty 1250

## 2012-04-16 MED ORDER — VANCOMYCIN HCL 1000 MG IV SOLR
2500.0000 mg | Freq: Once | INTRAVENOUS | Status: AC
  Administered 2012-04-16: 2500 mg via INTRAVENOUS
  Filled 2012-04-16: qty 2500

## 2012-04-16 MED ORDER — TRIAMTERENE-HCTZ 37.5-25 MG PO TABS
1.0000 | ORAL_TABLET | Freq: Every day | ORAL | Status: DC
  Administered 2012-04-16 – 2012-04-19 (×4): 1 via ORAL
  Filled 2012-04-16 (×4): qty 1

## 2012-04-16 MED ORDER — SENNOSIDES-DOCUSATE SODIUM 8.6-50 MG PO TABS
1.0000 | ORAL_TABLET | Freq: Every day | ORAL | Status: DC
  Administered 2012-04-16: 1 via ORAL
  Filled 2012-04-16 (×2): qty 1

## 2012-04-16 MED ORDER — ACETAMINOPHEN 325 MG PO TABS: 650.0000 mg | ORAL_TABLET | ORAL | Status: DC | PRN

## 2012-04-16 MED ORDER — MORPHINE SULFATE 2 MG/ML IJ SOLN
2.0000 mg | INTRAMUSCULAR | Status: DC | PRN
  Administered 2012-04-16 (×3): 2 mg via INTRAVENOUS
  Filled 2012-04-16 (×3): qty 1

## 2012-04-16 MED ORDER — SODIUM CHLORIDE 0.9 % IV SOLN
2000.0000 mg | Freq: Once | INTRAVENOUS | Status: DC
  Filled 2012-04-16: qty 2000

## 2012-04-16 MED ORDER — HYDROCERIN EX CREA
TOPICAL_CREAM | Freq: Two times a day (BID) | CUTANEOUS | Status: DC
  Administered 2012-04-16 – 2012-04-19 (×6): via TOPICAL
  Filled 2012-04-16: qty 113

## 2012-04-16 NOTE — Progress Notes (Signed)
Internal Medicine Attending  Date: 04/16/2012  Patient name: [name] Medical record number: [id number] Date of birth: 08-19-1961 Age: 51 y.o. Gender: female  I saw and evaluated the patient and discussed her care on a.m. rounds with house staff. I reviewed the resident's note by Dr. Shirlee Latch and I agree with the resident's findings and plans as documented in her note, with the following additional comments.  Patient continues to have significant right upper abdominal pain, and is moderately tender in that area.  Urine culture is growing greater than 100,000 colonies per mL of Escherichia coli sensitive to ceftriaxone.  One of 2 blood cultures is growing gram-positive cocci in clusters with identification pending; this may be a contaminant, although patient has been started on vancomycin pending bacterial identification.  Plan is continue ceftriaxone for Escherichia coli pyelonephritis and follow for further clinical improvement; consider repeat abdominal imaging if her abdominal pain persists or worsens after pyelonephritis is treated.  The cause of her mild transaminase elevations is not clear; hepatitis serology is pending.

## 2012-04-16 NOTE — Progress Notes (Addendum)
Subjective: Pt received pain med just now so no pain, denies nausea. Wants liq diet bland isnt tolerating. Wants cream for eczema to skin.  Patient's sister mentions that she wipes the wrong way after using the bathroom   Objective: Vital signs in last 24 hours: Filed Vitals:   04/15/12 1400 04/15/12 2152 04/16/12 0111 04/16/12 0632  BP: 142/79 138/73  132/74  Pulse: 75 87  73  Temp: 98.4 F (36.9 C) 100.5 F (38.1 C) 98.8 F (37.1 C) 98.4 F (36.9 C)  TempSrc:  Oral Oral Oral  Resp: 16 20  18   Height:      Weight:      SpO2: 99% 98%  97%   Weight change:   Intake/Output Summary (Last 24 hours) at 04/16/12 1151 Last data filed at 04/16/12 0865  Gross per 24 hour  Intake   3625 ml  Output      0 ml  Net   3625 ml   Vitals reviewed. General: resting in bed, NAD HEENT: no scleral icterus Cardiac: RRR, no rubs, murmurs or gallops Pulm: clear to auscultation bilaterally, no wheezes, rales, or rhonchi Abd: soft, obese, min TTP RUQ with focal rebound to epigastric region, no CVA ttp, nondistended, BS present (normal) Ext: warm and well perfused, no pedal edema Skin: eczematous patches to trunk, extremities  Neuro: alert and oriented X3 Lab Results: Basic Metabolic Panel:  Lab 04/16/12 7846 04/15/12 0915 04/15/12 0908 04/15/12 0430  NA 139 -- -- 137  K 3.5 3.5 -- --  CL 105 -- -- 104  CO2 22 -- -- 24  GLUCOSE 93 -- -- 131*  BUN 7 -- -- 9  CREATININE 0.92 -- -- 0.96  CALCIUM 8.8 -- -- 8.4  MG -- -- 2.2 --  PHOS -- -- 3.0 --   Liver Function Tests:  Lab 04/16/12 0600 04/14/12 1945  AST 78* 87*  ALT 149* 121*  ALKPHOS 115 105  BILITOT 0.3 0.6  PROT 6.8 7.5  ALBUMIN 2.8* 3.3*    Lab 04/14/12 1945  LIPASE 33  AMYLASE --   CBC:  Lab 04/16/12 0600 04/15/12 0435 04/14/12 1945  WBC 6.2 6.8 --  NEUTROABS 3.3 -- 5.7  HGB 11.2* 11.2* --  HCT 32.6* 32.6* --  MCV 84.5 83.6 --  PLT 276 243 --   Hemoglobin A1C:  Lab 04/15/12 0430  HGBA1C 5.9*   Anemia  Panel:  Lab 04/15/12 0908  VITAMINB12 402  FOLATE 6.9  FERRITIN 414*  TIBC 208*  IRON 27*  RETICCTPCT 1.1   Urinalysis:  Lab 04/14/12 2050  COLORURINE AMBER*  LABSPEC 1.020  PHURINE 6.0  GLUCOSEU NEGATIVE  HGBUR LARGE*  BILIRUBINUR SMALL*  KETONESUR TRACE*  PROTEINUR 30*  UROBILINOGEN 4.0*  NITRITE NEGATIVE  LEUKOCYTESUR LARGE*   Misc. Labs: pending hepatitis panel, tsh  Micro Results: Aerobic bottle gm+cocci in clusters Urine +gm neg rods  Studies/Results: Dg Chest 2 View  04/14/2012  *RADIOLOGY REPORT*  Clinical Data: Fever.  Abdominal pain.  CHEST - 2 VIEW  Comparison: CT and plain films of the chest 04/02/2011.  Findings: Lungs are clear.  There is cardiomegaly.  No pneumothorax or pleural fluid.  IMPRESSION: Cardiomegaly without acute disease.   Original Report Authenticated By: Bernadene Bell. Maricela Curet, M.D.    US Transvaginal Non-ob  04/16/2012  *RADIOLOGY REPORT*  Clinical Data: Left adnexal mass.  Prior hysterectomy.  TRANSABDOMINAL AND TRANSVAGINAL ULTRASOUND OF PELVIS  Technique:  Both transabdominal and transvaginal ultrasound examinations of the pelvis were performed.  Transabdominal technique was performed for global imaging of the pelvis including uterus, ovaries, adnexal regions, and pelvic cul-de-sac.  It was necessary to proceed with endovaginal exam following the transabdominal exam to visualize the ovaries.  Comparison:  03/16/2010 ultrasound, CT 10/10/2008  Findings: Uterus:  Surgically absent.  No mass at the vaginal cuff.  Endometrium: Surgically absent.  Right ovary: Not seen transabdominally or transvaginally.  No right adnexal mass.  Left ovary: 7.8 x 5.4 x 4.9 cm.  Several simple appearing cystic structures are noted with intervening ovarian tissue.  Dominant cyst measures 5.0 x 4.2 x 4.1 cm.  Other Findings:  No free fluid  IMPRESSION: Simple appearing left ovarian cysts.  Dominant cyst measures 5.0 cm in maximal diameter.  Reportedly, the patient is  asymptomatic. This is almost certainly benign, but follow up ultrasound is recommended in 1 year according to the Society of Radiologists in Ultrasound 2010 Consensus Conference Statement (D Lenis Noon et al.  Management of Asymptomatic Ovarian and Other Adnexal Cysts Imaged at [place]:  Society of Radiologists in Ultrasound Consensus Conference Statement 2010. Radiology 256 (Sept 2010):  943-954.).  Alternatively, this could represent ovarian cystadenoma or potentially cystadenocarcinoma with multiple loculations, but by appearances, several individual simple appearing cysts are favored.   Original Report Authenticated By: Harrel Lemon, M.D.    US Pelvis Complete  04/16/2012  *RADIOLOGY REPORT*  Clinical Data: Left adnexal mass.  Prior hysterectomy.  TRANSABDOMINAL AND TRANSVAGINAL ULTRASOUND OF PELVIS  Technique:  Both transabdominal and transvaginal ultrasound examinations of the pelvis were performed.  Transabdominal technique was performed for global imaging of the pelvis including uterus, ovaries, adnexal regions, and pelvic cul-de-sac.  It was necessary to proceed with endovaginal exam following the transabdominal exam to visualize the ovaries.  Comparison:  03/16/2010 ultrasound, CT 10/10/2008  Findings: Uterus:  Surgically absent.  No mass at the vaginal cuff.  Endometrium: Surgically absent.  Right ovary: Not seen transabdominally or transvaginally.  No right adnexal mass.  Left ovary: 7.8 x 5.4 x 4.9 cm.  Several simple appearing cystic structures are noted with intervening ovarian tissue.  Dominant cyst measures 5.0 x 4.2 x 4.1 cm.  Other Findings:  No free fluid  IMPRESSION: Simple appearing left ovarian cysts.  Dominant cyst measures 5.0 cm in maximal diameter.  Reportedly, the patient is asymptomatic. This is almost certainly benign, but follow up ultrasound is recommended in 1 year according to the Society of Radiologists in Ultrasound 2010 Consensus Conference Statement (D Lenis Noon et al.  Management  of Asymptomatic Ovarian and Other Adnexal Cysts Imaged at [place]:  Society of Radiologists in Ultrasound Consensus Conference Statement 2010. Radiology 256 (Sept 2010):  943-954.).  Alternatively, this could represent ovarian cystadenoma or potentially cystadenocarcinoma with multiple loculations, but by appearances, several individual simple appearing cysts are favored.   Original Report Authenticated By: Harrel Lemon, M.D.    Ct Abdomen Pelvis W Contrast  04/14/2012  *RADIOLOGY REPORT*  Clinical Data: Abdominal pain  CT ABDOMEN AND PELVIS WITH CONTRAST  Technique:  Multidetector CT imaging of the abdomen and pelvis was performed following the standard protocol during bolus administration of intravenous contrast.  Contrast: OMNIPAQUE IOHEXOL 300 MG/ML  SOLN  Comparison: 10/10/2008  Findings: Mild bibasilar scarring or atelectasis.  Low attenuation of the liver is nonspecific post contrast however suggests fatty infiltration.  No focal lesion identified in the absent gallbladder.  No biliary ductal dilatation.  Unremarkable spleen with the exception of a punctate calcification. Unremarkable pancreas and adrenal glands.  Lobular renal contours with heterogeneous/striated enhancement.  No hydronephrosis or hydroureter.  No bowel obstruction.  No CT evidence for colitis.  Colonic diverticulosis.  Normal appendix.  No free intraperitoneal air or fluid.  The normal caliber vasculature.  Thin-walled bladder. Nonspecific 6.5 cm left adnexal cyst.  Absent uterus.  No pelvic adenopathy.  Lower thoracic and L3-4 degenerative changes.  No acute osseous finding.  IMPRESSION: Striated renal enhancement is a pattern that can be seen with pyelonephritis (favored) or infarctions. No abscess.  Low attenuation of the liver is nonspecific post contrast however suggests steatosis.  Nonspecific left adnexal/ovarian cyst measures up to 6.5 cm. Recommend a non emergent pelvic ultrasound follow-up.   Original Report Authenticated  By: Waneta Martins, M.D.    Medications: Scheduled Meds:    . cefTRIAXone (ROCEPHIN)  IV  2 g Intravenous Q24H  . enoxaparin (LOVENOX) injection  40 mg Subcutaneous Q24H  . hydrocerin   Topical BID  . hydrocortisone valerate cream   Topical BID  . potassium chloride SA  40 mEq Oral Q4H  . triamterene-hydrochlorothiazide  1 each Oral Daily  . vancomycin  1,250 mg Intravenous Q12H  . vancomycin  2,500 mg Intravenous Once  . DISCONTD: vancomycin  2,000 mg Intravenous Once   Continuous Infusions:    . sodium chloride 125 mL/hr at 04/16/12 0128   PRN Meds:.acetaminophen, morphine injection, ondansetron (ZOFRAN) IV, promethazine, traMADol, zolpidem, DISCONTD: acetaminophen, DISCONTD:  morphine injection Assessment/Plan: 51 y.o woman PMH HTN, phyllodes tumor left breast, fibroid uterus s/p hysterectomy, anemia, multiple PE/DVT who presented 8/26 with abdominal pain, fever.    1. Acute Pyelonephritis  -CT shows evidence of right but also possible minimal left pyelo -pain control morphine 2 mg q2, Ultram 50 mg q6, d/c Tylenol 650 mg q 4 for pain use only fever prn -T max overnight 100.5 -will monitor cbc  -Abx Rocephin started 8/27 - added Vanc per pharm b/c aerobic bottle +gm +cocci in clusters. This could be a contaminant, pending growth -urine Cx + showing gm neg rods  -Ed on wiping front to back with using the restroom  2. Elevated LFTs -will cont to trend  -pending hepatitis panel  -d/c Tylenol for pain use prn  3. Hyperlipidemia Lipid Panel     Component Value Date/Time   CHOL 184 04/16/2012 0600   TRIG 79 04/16/2012 0600   HDL 38* 04/16/2012 0600   CHOLHDL 4.8 04/16/2012 0600   VLDL 16 04/16/2012 0600   LDLCALC 130* 04/16/2012 0600   -consider statin   4. H/o HYPERTENSION  -BP 132/74 -restarted home BP med Triamterene-HCTZ 37.5-25 mg qd   5. Adnexal mass -US pelvis/transvaginal performed shows left ovarian cyst  6. PULMONARY EMBOLISM, HX OF/ H/O DVT (deep  venous thrombosis) -consider hypercoaguable w/u with significant PMH and FH thromboembolic events   7. Anemia -see anemia panel consistent with chronic disease   8. F/E/N -NS 125 cc/hr -will monitor and replace electrolytes prn -liq diet   9. Dispo -home when w/u complete    LOS: 2 days   Annett Gula [phone] 04/16/2012, 11:51 AM

## 2012-04-16 NOTE — Progress Notes (Signed)
ANTIBIOTIC CONSULT NOTE - INITIAL  Pharmacy Consult for vancomycin Indication: "blood cx aerobic +gm +coccin in clusters and pyelonephritis"  Allergies  Allergen Reactions  . Dilaudid (Hydromorphone Hcl) Nausea Only    Patient Measurements: Height: 5\' 11"  (180.3 cm) Weight: 278 lb (126.1 kg) IBW/kg (Calculated) : 70.8   Vital Signs: Temp: 98.4 F (36.9 C) (08/28 1610) Temp src: Oral (08/28 9604) BP: 132/74 mmHg (08/28 5409) Pulse Rate: 73  (08/28 0632) Intake/Output from previous day: 08/27 0701 - 08/28 0700 In: 3985 [P.O.:360; I.V.:3575; IV Piggyback:50] Out: -  Intake/Output from this shift:    Labs:  Basename 04/16/12 0600 04/15/12 0435 04/15/12 0430 04/14/12 1945  WBC 6.2 6.8 -- 9.0  HGB 11.2* 11.2* -- 12.3  PLT 276 243 -- 303  LABCREA -- -- -- --  CREATININE 0.92 -- 0.96 1.15*   Estimated Creatinine Clearance: 103.7 ml/min (by C-G formula based on Cr of 0.92). No results found for this basename: VANCOTROUGH:2,VANCOPEAK:2,VANCORANDOM:2,GENTTROUGH:2,GENTPEAK:2,GENTRANDOM:2,TOBRATROUGH:2,TOBRAPEAK:2,TOBRARND:2,AMIKACINPEAK:2,AMIKACINTROU:2,AMIKACIN:2, in the last 72 hours   Microbiology: Recent Results (from the past 720 hour(s))  URINE CULTURE     Status: Normal (Preliminary result)   Collection Time   04/14/12  8:50 PM      Component Value Range Status Comment   Specimen Description URINE, CATHETERIZED   Final    Special Requests NONE   Final    Culture  Setup Time 04/15/2012 05:20   Final    Colony Count >=100,000 COLONIES/ML   Final    Culture GRAM NEGATIVE RODS   Final    Report Status PENDING   Incomplete   CULTURE, BLOOD (ROUTINE X 2)     Status: Normal (Preliminary result)   Collection Time   04/15/12  4:35 AM      Component Value Range Status Comment   Specimen Description BLOOD RIGHT ARM   Final    Special Requests BOTTLES DRAWN AEROBIC ONLY 5CC   Final    Culture  Setup Time 04/15/2012 08:48   Final    Culture     Final    Value: GRAM POSITIVE  COCCI IN CLUSTERS     Note: Gram Stain Report Called to,Read Back By and Verified With: [facility] BRADY @ [phone] 04/16/12 BY KRAWS   Report Status PENDING   Incomplete     Medical History: Past Medical History  Diagnosis Date  . Hypertension   . Breast mass 02/2008     Noted on mammogram March 11, 2008 3 cm simple cyst at 9 to 10:00 position of the right breast as well as multiple other smaller simple cyst, 2.6 cm mass at the 10:00 position of the right breast also representing complex cyst,  . 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 Fibroid uterus  2001     status post total abdominal hysterectomy, right and left salpingo-oophorectomy , done by Dr. Clearance Coots  . Anemia 2009     Baseline hemoglobin at 11, secondary to iron deficiency  . Deep vein thrombosis (DVT)      history of multiple  DVTs in 1989, 1989, 2004, on chronic anticoagulation with Coumadin  . PE (pulmonary embolism)      history of multiple PEs in 1984, 1989, 2004-  electronic data reviewed in [facility] and EMR and I was not able to find single CT  angiogram that was positive for pulmonary embolus nor was I able to find Doppler studies that were positive for DVTs    Medications:  Prescriptions prior to admission  Medication Sig Dispense Refill  . ferrous sulfate 325 (65 FE) MG tablet Take 325 mg by mouth daily. Take with food.       . naproxen (NAPROSYN) 500 MG tablet Take 500 mg by mouth 2 (two) times daily with a meal.      . naproxen sodium (ANAPROX) 220 MG tablet Take 440 mg by mouth 2 (two) times daily with a meal.      . triamterene-hydrochlorothiazide (MAXZIDE-25) 37.5-25 MG per tablet Take 1 tablet by mouth daily.       Assessment: [name] is a 51 yo obese F with 1 of 2 blood cx 8/27  + for  gram positive cocci in clusters and 8/26 urine cx with > 100 K GNR.   Her WBC is down to 6.2 and her T max is 100.5.  She has been on ceftriaxone 2 gm q24 hours since 8/27 at 05 am.  Her weight is 126 kg and her creat cl is ~ 104 ml/min  Goal of Therapy:  Vancomycin trough level 15-20 mcg/ml  Plan:  1. Vancomycin 2500 mg IV loading dose, then vancomycin 1250 mg IV q12h antibiotic drug levels at steady state Follow up culture results Herby Abraham, Pharm.D. [phone] 04/16/2012 8:46 AM

## 2012-04-17 DIAGNOSIS — R7989 Other specified abnormal findings of blood chemistry: Secondary | ICD-10-CM

## 2012-04-17 DIAGNOSIS — E279 Disorder of adrenal gland, unspecified: Secondary | ICD-10-CM

## 2012-04-17 LAB — CBC WITH DIFFERENTIAL/PLATELET
Basophils Absolute: 0 10*3/uL (ref 0.0–0.1)
Basophils Relative: 0 % (ref 0–1)
Eosinophils Absolute: 0.1 10*3/uL (ref 0.0–0.7)
Eosinophils Relative: 1 % (ref 0–5)
HCT: 35.3 % — ABNORMAL LOW (ref 36.0–46.0)
Hemoglobin: 12 g/dL (ref 12.0–15.0)
Lymphocytes Relative: 33 % (ref 12–46)
Lymphs Abs: 1.9 10*3/uL (ref 0.7–4.0)
MCH: 28.4 pg (ref 26.0–34.0)
MCHC: 34 g/dL (ref 30.0–36.0)
MCV: 83.6 fL (ref 78.0–100.0)
Monocytes Absolute: 0.7 10*3/uL (ref 0.1–1.0)
Monocytes Relative: 12 % (ref 3–12)
Neutro Abs: 3 10*3/uL (ref 1.7–7.7)
Neutrophils Relative %: 53 % (ref 43–77)
Platelets: 324 10*3/uL (ref 150–400)
RBC: 4.22 MIL/uL (ref 3.87–5.11)
RDW: 13.2 % (ref 11.5–15.5)
WBC: 5.6 10*3/uL (ref 4.0–10.5)

## 2012-04-17 LAB — CULTURE, BLOOD (ROUTINE X 2)

## 2012-04-17 LAB — COMPREHENSIVE METABOLIC PANEL
ALT: 144 U/L — ABNORMAL HIGH (ref 0–35)
AST: 64 U/L — ABNORMAL HIGH (ref 0–37)
Albumin: 3 g/dL — ABNORMAL LOW (ref 3.5–5.2)
Alkaline Phosphatase: 130 U/L — ABNORMAL HIGH (ref 39–117)
BUN: 5 mg/dL — ABNORMAL LOW (ref 6–23)
CO2: 23 mEq/L (ref 19–32)
Calcium: 9.2 mg/dL (ref 8.4–10.5)
Chloride: 100 mEq/L (ref 96–112)
Creatinine, Ser: 0.9 mg/dL (ref 0.50–1.10)
GFR calc Af Amer: 83 mL/min — ABNORMAL LOW (ref >90)
GFR calc non Af Amer: 72 mL/min — ABNORMAL LOW (ref >90)
Glucose, Bld: 126 mg/dL — ABNORMAL HIGH (ref 70–99)
Potassium: 3.1 mEq/L — ABNORMAL LOW (ref 3.5–5.1)
Sodium: 134 mEq/L — ABNORMAL LOW (ref 135–145)
Total Bilirubin: 0.3 mg/dL (ref 0.3–1.2)
Total Protein: 7.3 g/dL (ref 6.0–8.3)

## 2012-04-17 LAB — HEPATITIS PANEL, ACUTE
HCV Ab: NEGATIVE
Hep A IgM: NEGATIVE
Hep B C IgM: NEGATIVE
Hepatitis B Surface Ag: NEGATIVE

## 2012-04-17 LAB — MAGNESIUM: Magnesium: 2 mg/dL (ref 1.5–2.5)

## 2012-04-17 LAB — ACETAMINOPHEN LEVEL: Acetaminophen (Tylenol), Serum: <15 ug/mL (ref 10–30)

## 2012-04-17 LAB — PHOSPHORUS: Phosphorus: 3.3 mg/dL (ref 2.3–4.6)

## 2012-04-17 MED ORDER — POTASSIUM CHLORIDE CRYS ER 20 MEQ PO TBCR
40.0000 meq | EXTENDED_RELEASE_TABLET | Freq: Once | ORAL | Status: AC
  Administered 2012-04-17: 40 meq via ORAL
  Filled 2012-04-17: qty 2

## 2012-04-17 MED ORDER — PROMETHAZINE HCL 12.5 MG PO TABS
12.5000 mg | ORAL_TABLET | ORAL | Status: DC | PRN
Start: 2012-04-17 — End: 2012-04-19
  Administered 2012-04-18: 12.5 mg via ORAL
  Filled 2012-04-17: qty 1

## 2012-04-17 MED ORDER — LEVOFLOXACIN 750 MG PO TABS
750.0000 mg | ORAL_TABLET | ORAL | Status: DC
  Administered 2012-04-17: 750 mg via ORAL
  Filled 2012-04-17 (×2): qty 1

## 2012-04-17 MED ORDER — OXYCODONE HCL 5 MG PO TABS
5.0000 mg | ORAL_TABLET | ORAL | Status: DC | PRN
  Administered 2012-04-17 – 2012-04-18 (×2): 5 mg via ORAL
  Filled 2012-04-17 (×2): qty 1

## 2012-04-17 NOTE — Progress Notes (Signed)
Internal Medicine Attending  Date: 04/17/2012  Patient name: [name] Medical record number: [id number] Date of birth: [birth year] Age: 51 y.o. Gender: female  I saw and evaluated the patient and discussed her care with house staff.  See the note by resident Dr. Shirlee Latch for details of clinical findings and plans.  Patient's abdominal pain is significantly better, and she is much less tender; she would like to advance her diet.  Her urine is growing Escherichia coli sensitive to ceftriaxone; only one of 2 blood cultures was positive for coag-negative staph, which is likely a contaminant.  Agree with plan to stop vancomycin, and transition from ceftriaxone to an oral antibiotic to cover pyelonephritis with total treatment of 10-14 days.  I discussed the finding of cysts on pelvic ultrasound with patient, and the advisability of a followup ultrasound in one year as well as outpatient GYN assessment.

## 2012-04-17 NOTE — Progress Notes (Signed)
Subjective: Patient reports nausea but not vomiting.  She reports 5/10 ab pain especially epigastric region.  She reports she had her hysterectomy and right ovary removed by Dr. Clearance Coots and had a complicated course of wound healing due to the wound dehiscing.  She had a wound vac.  She reports monthly breast tenderness, pink spotting, abdominal distension and hot flashes at times.  She expresses gratitude by services provided by staff at [facility]. She reports eczema is improved with creams.  She wants to eat a hamburger and fries.     Objective: Vital signs in last 24 hours: Filed Vitals:   04/16/12 1300 04/16/12 2313 04/17/12 0701 04/17/12 1430  BP: 120/80 117/64 120/65 136/84  Pulse: 70 90 86 64  Temp: 97.6 F (36.4 C) 99.4 F (37.4 C) 99.6 F (37.6 C) 98.1 F (36.7 C)  TempSrc: Oral     Resp: 18 18 18 16   Height:      Weight:      SpO2: 98% 97% 98% 98%   Weight change:   Intake/Output Summary (Last 24 hours) at 04/17/12 1536 Last data filed at 04/17/12 1300  Gross per 24 hour  Intake 2224.59 ml  Output      5 ml  Net 2219.59 ml   Vitals reviewed. General: resting in bed, NAD HEENT: no scleral icterus Cardiac: RRR, no rubs, murmurs or gallops Pulm: clear to auscultation bilaterally, no wheezes, rales, or rhonchi Abd: soft, obese, min TTP epigastric region, min right CVA ttp, no left CVA ttp, nondistended, BS present (normal) Ext: warm and well perfused, no pedal edema Skin: eczematous patches to trunk, extremities  Neuro: alert and oriented X3 Lab Results: Basic Metabolic Panel:  Lab 04/17/12 1610 04/16/12 0600 04/15/12 0908  NA 134* 139 --  K 3.1* 3.5 --  CL 100 105 --  CO2 23 22 --  GLUCOSE 126* 93 --  BUN 5* 7 --  CREATININE 0.90 0.92 --  CALCIUM 9.2 8.8 --  MG 2.0 -- 2.2  PHOS 3.3 -- 3.0   Liver Function Tests:  Lab 04/17/12 0630 04/16/12 0600  AST 64* 78*  ALT 144* 149*  ALKPHOS 130* 115  BILITOT 0.3 0.3  PROT 7.3 6.8  ALBUMIN 3.0* 2.8*    Lab  04/14/12 1945  LIPASE 33  AMYLASE --   CBC:  Lab 04/17/12 0630 04/16/12 0600  WBC 5.6 6.2  NEUTROABS 3.0 3.3  HGB 12.0 11.2*  HCT 35.3* 32.6*  MCV 83.6 84.5  PLT 324 276   Hemoglobin A1C:  Lab 04/15/12 0430  HGBA1C 5.9*   Anemia Panel:  Lab 04/15/12 0908  VITAMINB12 402  FOLATE 6.9  FERRITIN 414*  TIBC 208*  IRON 27*  RETICCTPCT 1.1   Urinalysis:  Lab 04/14/12 2050  COLORURINE AMBER*  LABSPEC 1.020  PHURINE 6.0  GLUCOSEU NEGATIVE  HGBUR LARGE*  BILIRUBINUR SMALL*  KETONESUR TRACE*  PROTEINUR 30*  UROBILINOGEN 4.0*  NITRITE NEGATIVE  LEUKOCYTESUR LARGE*   Misc. Labs: pending hepatitis panel  Micro Results: Aerobic bottle coagulase negative staph Urine +E. coli  Studies/Results: US Transvaginal Non-ob  04/16/2012  *RADIOLOGY REPORT*  Clinical Data: Left adnexal mass.  Prior hysterectomy.  TRANSABDOMINAL AND TRANSVAGINAL ULTRASOUND OF PELVIS  Technique:  Both transabdominal and transvaginal ultrasound examinations of the pelvis were performed.  Transabdominal technique was performed for global imaging of the pelvis including uterus, ovaries, adnexal regions, and pelvic cul-de-sac.  It was necessary to proceed with endovaginal exam following the transabdominal exam to visualize the  ovaries.  Comparison:  03/16/2010 ultrasound, CT 10/10/2008  Findings: Uterus:  Surgically absent.  No mass at the vaginal cuff.  Endometrium: Surgically absent.  Right ovary: Not seen transabdominally or transvaginally.  No right adnexal mass.  Left ovary: 7.8 x 5.4 x 4.9 cm.  Several simple appearing cystic structures are noted with intervening ovarian tissue.  Dominant cyst measures 5.0 x 4.2 x 4.1 cm.  Other Findings:  No free fluid  IMPRESSION: Simple appearing left ovarian cysts.  Dominant cyst measures 5.0 cm in maximal diameter.  Reportedly, the patient is asymptomatic. This is almost certainly benign, but follow up ultrasound is recommended in 1 year according to the Society of  Radiologists in Ultrasound 2010 Consensus Conference Statement (D Lenis Noon et al.  Management of Asymptomatic Ovarian and Other Adnexal Cysts Imaged at [place]:  Society of Radiologists in Ultrasound Consensus Conference Statement 2010. Radiology 256 (Sept 2010):  [phone].).  Alternatively, this could represent ovarian cystadenoma or potentially cystadenocarcinoma with multiple loculations, but by appearances, several individual simple appearing cysts are favored.   Original Report Authenticated By: Harrel Lemon, M.D.    US Pelvis Complete  04/16/2012  *RADIOLOGY REPORT*  Clinical Data: Left adnexal mass.  Prior hysterectomy.  TRANSABDOMINAL AND TRANSVAGINAL ULTRASOUND OF PELVIS  Technique:  Both transabdominal and transvaginal ultrasound examinations of the pelvis were performed.  Transabdominal technique was performed for global imaging of the pelvis including uterus, ovaries, adnexal regions, and pelvic cul-de-sac.  It was necessary to proceed with endovaginal exam following the transabdominal exam to visualize the ovaries.  Comparison:  03/16/2010 ultrasound, CT 10/10/2008  Findings: Uterus:  Surgically absent.  No mass at the vaginal cuff.  Endometrium: Surgically absent.  Right ovary: Not seen transabdominally or transvaginally.  No right adnexal mass.  Left ovary: 7.8 x 5.4 x 4.9 cm.  Several simple appearing cystic structures are noted with intervening ovarian tissue.  Dominant cyst measures 5.0 x 4.2 x 4.1 cm.  Other Findings:  No free fluid  IMPRESSION: Simple appearing left ovarian cysts.  Dominant cyst measures 5.0 cm in maximal diameter.  Reportedly, the patient is asymptomatic. This is almost certainly benign, but follow up ultrasound is recommended in 1 year according to the Society of Radiologists in Ultrasound 2010 Consensus Conference Statement (D Lenis Noon et al.  Management of Asymptomatic Ovarian and Other Adnexal Cysts Imaged at [place]:  Society of Radiologists in Ultrasound Consensus Conference  Statement 2010. Radiology 256 (Sept 2010):  [phone].).  Alternatively, this could represent ovarian cystadenoma or potentially cystadenocarcinoma with multiple loculations, but by appearances, several individual simple appearing cysts are favored.   Original Report Authenticated By: Harrel Lemon, M.D.    Medications: Scheduled Meds:    . enoxaparin (LOVENOX) injection  40 mg Subcutaneous Q24H  . hydrocerin   Topical BID  . hydrocortisone valerate cream   Topical BID  . levofloxacin  750 mg Oral Q24H  . potassium chloride  40 mEq Oral Once  . senna-docusate  1 tablet Oral QHS  . triamterene-hydrochlorothiazide  1 each Oral Daily  . DISCONTD: cefTRIAXone (ROCEPHIN)  IV  2 g Intravenous Q24H  . DISCONTD: vancomycin  1,250 mg Intravenous Q12H   Continuous Infusions:    . DISCONTD: sodium chloride 125 mL/hr at 04/16/12 2235   PRN Meds:.acetaminophen, oxyCODONE, promethazine, traMADol, zolpidem, DISCONTD:  morphine injection, DISCONTD: promethazine Assessment/Plan: 51 y.o woman PMH HTN, phyllodes tumor left breast, fibroid uterus s/p hysterectomy and right ovary resection, anemia, multiple PE/DVT who presented 8/26 with abdominal  pain, fever.    1. Acute Pyelonephritis  -noted on CT -pain control will transition to oral Roxi q4 prn -phernergan prn -afebrile over 24 hrs, no leukocytosis -Cont. Rocephin started 8/27 but dc'ed 8/29 to transition to Levoquin 750 mg x total of 10 days of antibx tx.  -d/c'ed Vanc per pharm b/c 1/2 aerobic bottles +gm +cocci in clusters which showed Coagulase negative staph. This could be a contaminant.  -urine Cx + E.coli with sensitivities resulted -pt given educational information    2. Elevated LFTs -ALT stable 149-->144, AP continues to trend up 115-->130, AST trending down 78-->64 -pending hepatitis panel  -pending ggt level to indicate potential cause as liver  3. Hyperlipidemia Lipid Panel     Component Value Date/Time   CHOL 184  04/16/2012 0600   TRIG 79 04/16/2012 0600   HDL 38* 04/16/2012 0600   CHOLHDL 4.8 04/16/2012 0600   VLDL 16 04/16/2012 0600   LDLCALC 130* 04/16/2012 0600   -consider statin outpatient follow up   4. H/o HYPERTENSION  -BP 117/64 -cont Triamterene-HCTZ 37.5-25 mg qd seems to be controlling bp.   5. Adnexal mass -04/16/12 US pelvis/transvaginal performed shows left ovarian cyst 7.8x5.4x4.9 cm with several simple cystic structures with intervening ovarian tissues with dominant cyst 5.0 x 4.2 x 4.1 cm.  Follow up in 1 year is recommended; consider benign vs cystadenoma vs cysadenocarcinoma.    -Spoke with radiologist 8/29 discussed that it is hard to tell on this imaging but she recommends if the patient is premenopausal follow up <1 year. She recommends re-imaging after a complete cycle if the cyst is functional it will have resolved completely no need to follow up; if the patient is postmenopausal resolution is unlikely to occur and there is the possibility of septation; in postmenoupausal women the lesion is most likely benign and one doesn't necessarily have to follow up in 1 year.  She recommends trying to the characterize cyst better with MRI with contrast because in obese patient it can be hard to tell.      -pt wants to proceed with further w/u d/t family history of cancer and she does not want to wait 1year   6. PULMONARY EMBOLISM, HX OF/ H/O DVT (deep venous thrombosis) -consider hypercoaguable w/u with significant PMH and FH thromboembolic events   7. Anemia -see anemia panel consistent with chronic disease  8. F/E/N -NS dc'ed -will monitor and replace electrolytes prn (K replaced today); hypoNa noted today  -changed liq diet to regular   9. Dispo -home possibly 7/30    LOS: 3 days   Annett Gula [phone] 04/17/2012, 3:36 PM

## 2012-04-18 ENCOUNTER — Encounter (HOSPITAL_COMMUNITY): Payer: Self-pay | Admitting: Internal Medicine

## 2012-04-18 ENCOUNTER — Inpatient Hospital Stay (HOSPITAL_COMMUNITY): Payer: MEDICAID

## 2012-04-18 DIAGNOSIS — R1013 Epigastric pain: Secondary | ICD-10-CM

## 2012-04-18 DIAGNOSIS — N10 Acute tubulo-interstitial nephritis: Principal | ICD-10-CM

## 2012-04-18 DIAGNOSIS — K5790 Diverticulosis of intestine, part unspecified, without perforation or abscess without bleeding: Secondary | ICD-10-CM | POA: Diagnosis present

## 2012-04-18 LAB — CBC WITH DIFFERENTIAL/PLATELET
Basophils Absolute: 0 10*3/uL (ref 0.0–0.1)
Basophils Relative: 0 % (ref 0–1)
Eosinophils Absolute: 0.1 10*3/uL (ref 0.0–0.7)
Eosinophils Relative: 1 % (ref 0–5)
HCT: 36.5 % (ref 36.0–46.0)
Hemoglobin: 12.6 g/dL (ref 12.0–15.0)
Lymphocytes Relative: 32 % (ref 12–46)
Lymphs Abs: 1.9 10*3/uL (ref 0.7–4.0)
MCH: 28.9 pg (ref 26.0–34.0)
MCHC: 34.5 g/dL (ref 30.0–36.0)
MCV: 83.7 fL (ref 78.0–100.0)
Monocytes Absolute: 0.7 10*3/uL (ref 0.1–1.0)
Monocytes Relative: 12 % (ref 3–12)
Neutro Abs: 3.3 10*3/uL (ref 1.7–7.7)
Neutrophils Relative %: 55 % (ref 43–77)
Platelets: 346 10*3/uL (ref 150–400)
RBC: 4.36 MIL/uL (ref 3.87–5.11)
RDW: 13.1 % (ref 11.5–15.5)
WBC: 6 10*3/uL (ref 4.0–10.5)

## 2012-04-18 LAB — COMPREHENSIVE METABOLIC PANEL
ALT: 134 U/L — ABNORMAL HIGH (ref 0–35)
AST: 45 U/L — ABNORMAL HIGH (ref 0–37)
Albumin: 3.2 g/dL — ABNORMAL LOW (ref 3.5–5.2)
Alkaline Phosphatase: 126 U/L — ABNORMAL HIGH (ref 39–117)
BUN: 8 mg/dL (ref 6–23)
CO2: 24 mEq/L (ref 19–32)
Calcium: 9.6 mg/dL (ref 8.4–10.5)
Chloride: 100 mEq/L (ref 96–112)
Creatinine, Ser: 1.01 mg/dL (ref 0.50–1.10)
GFR calc Af Amer: 72 mL/min — ABNORMAL LOW (ref >90)
GFR calc non Af Amer: 62 mL/min — ABNORMAL LOW (ref >90)
Glucose, Bld: 93 mg/dL (ref 70–99)
Potassium: 3.6 mEq/L (ref 3.5–5.1)
Sodium: 137 mEq/L (ref 135–145)
Total Bilirubin: 0.2 mg/dL — ABNORMAL LOW (ref 0.3–1.2)
Total Protein: 7.5 g/dL (ref 6.0–8.3)

## 2012-04-18 LAB — GAMMA GT: GGT: 73 U/L — ABNORMAL HIGH (ref 7–51)

## 2012-04-18 LAB — LIPASE, BLOOD: Lipase: 37 U/L (ref 11–59)

## 2012-04-18 LAB — PHOSPHORUS: Phosphorus: 4.3 mg/dL (ref 2.3–4.6)

## 2012-04-18 LAB — MAGNESIUM: Magnesium: 2.1 mg/dL (ref 1.5–2.5)

## 2012-04-18 MED ORDER — LEVOFLOXACIN 500 MG PO TABS
500.0000 mg | ORAL_TABLET | ORAL | Status: DC
  Administered 2012-04-18: 500 mg via ORAL
  Filled 2012-04-18 (×3): qty 1

## 2012-04-18 MED ORDER — PANTOPRAZOLE SODIUM 40 MG PO TBEC
40.0000 mg | DELAYED_RELEASE_TABLET | Freq: Every day | ORAL | Status: DC
  Administered 2012-04-18 – 2012-04-19 (×2): 40 mg via ORAL
  Filled 2012-04-18: qty 1

## 2012-04-18 MED ORDER — OXYCODONE HCL 5 MG PO TABS
5.0000 mg | ORAL_TABLET | Freq: Four times a day (QID) | ORAL | Status: DC | PRN
  Administered 2012-04-18 – 2012-04-19 (×2): 5 mg via ORAL
  Filled 2012-04-18 (×2): qty 1

## 2012-04-18 MED ORDER — POLYETHYLENE GLYCOL 3350 17 G PO PACK
17.0000 g | PACK | Freq: Every day | ORAL | Status: DC
  Administered 2012-04-18: 17 g via ORAL
  Filled 2012-04-18 (×2): qty 1

## 2012-04-18 NOTE — Progress Notes (Signed)
Internal Medicine Attending  Date: 04/18/2012  Patient name: [name] Medical record number: [id number] Date of birth: 1961-04-05 Age: 51 y.o. Gender: female  I saw and evaluated the patient, and discussed her care on rounds with house staff. I reviewed the resident's note by Dr. Shirlee Latch and I agree with the resident's findings and plans as documented in her note, with the following additional comments.  Patient reports increased epigastric pain and nausea today, as well as loose stools.  She remains afebrile with normal white blood count, now on oral levofloxacin for Escherichia coli pyelonephritis; exam is notable for moderate epigastric tenderness.  Plans include check lipase, abdominal x-rays, stool for C. difficile toxin; add empiric PPI; will hold off on plans for discharge home today pending workup of increased abdominal pain.

## 2012-04-18 NOTE — Progress Notes (Addendum)
Subjective: Pt c/o 6/10 ab pain today in the epigastric region; associated with nausea. When revisited patient she states that area is tender.  She reports being under a lot of stress and wonders if stress can cause abdominal pain.  She reports loose stool yesterday.  Otherwise no complaints she is tolerating diet.     Objective: Vital signs in last 24 hours: Filed Vitals:   04/17/12 1430 04/17/12 2151 04/18/12 0520 04/18/12 1333  BP: 136/84 126/73 143/86 125/94  Pulse: 64 76 66 94  Temp: 98.1 F (36.7 C) 98.2 F (36.8 C) 98.5 F (36.9 C) 98 F (36.7 C)  TempSrc:  Oral Oral   Resp: 16 17 17 18   Height:      Weight:      SpO2: 98% 100% 100% 100%   Vitals reviewed. General: resting in bed, NAD HEENT: no scleral icterus Cardiac: RRR, no rubs, murmurs or gallops Pulm: clear to auscultation bilaterally, no wheezes, rales, or rhonchi Abd: soft, obese, min TTP epigastric region, min right CVA ttp, no left CVA ttp, nondistended, BS present (normal) Ext: warm and well perfused, no pedal edema Skin: eczematous patches to trunk, extremities  Neuro: alert and oriented X3   Lab Results: Basic Metabolic Panel:  Lab 04/18/12 2130 04/17/12 0630  NA 137 134*  K 3.6 3.1*  CL 100 100  CO2 24 23  GLUCOSE 93 126*  BUN 8 5*  CREATININE 1.01 0.90  CALCIUM 9.6 9.2  MG 2.1 2.0  PHOS 4.3 3.3   Liver Function Tests:  Lab 04/18/12 0555 04/17/12 0630  AST 45* 64*  ALT 134* 144*  ALKPHOS 126* 130*  BILITOT 0.2* 0.3  PROT 7.5 7.3  ALBUMIN 3.2* 3.0*    Lab 04/18/12 1403 04/14/12 1945  LIPASE 37 33  AMYLASE -- --   CBC:  Lab 04/18/12 0555 04/17/12 0630  WBC 6.0 5.6  NEUTROABS 3.3 3.0  HGB 12.6 12.0  HCT 36.5 35.3*  MCV 83.7 83.6  PLT 346 324   Hemoglobin A1C:  Lab 04/15/12 0430  HGBA1C 5.9*   Anemia Panel:  Lab 04/15/12 0908  VITAMINB12 402  FOLATE 6.9  FERRITIN 414*  TIBC 208*  IRON 27*  RETICCTPCT 1.1   Urinalysis:  Lab 04/14/12 2050  COLORURINE AMBER*    LABSPEC 1.020  PHURINE 6.0  GLUCOSEU NEGATIVE  HGBUR LARGE*  BILIRUBINUR SMALL*  KETONESUR TRACE*  PROTEINUR 30*  UROBILINOGEN 4.0*  NITRITE NEGATIVE  LEUKOCYTESUR LARGE*   Misc. Labs: none Micro Results:none  Studies/Results: Dg Abd 1 View  04/18/2012  *RADIOLOGY REPORT*  Clinical Data: Abdominal pain and nausea  ABDOMEN - 1 VIEW  Comparison: 04/18/2012  Findings: Single upright exam.  No free air evident.  No significant dilatation or obstruction.  Prior cholecystectomy noted.  Lung bases clear.  Degenerative changes evident of the spine.  IMPRESSION: No free air evident.  Nonobstructive bowel gas pattern.   Original Report Authenticated By: Judie Petit. Ruel Favors, M.D.    Dg Abd 1 View  04/18/2012  *RADIOLOGY REPORT*  Clinical Data: Epigastric abdominal pain.  Nausea.  ABDOMEN - 1 VIEW  Comparison: CT abdomen and pelvis 04/14/2012.  Findings: Bowel gas pattern unremarkable without evidence of obstruction or significant ileus.  Residual oral contrast from the CT in 2 adjacent descending colon diverticula.  Phlebolith low in the left side of the pelvis.  No visible opaque urinary tract calculi.  Mild degenerative changes involving the lumbar spine.  IMPRESSION: No acute abdominal abnormality.   Original Report  Authenticated By: Arnell Sieving, M.D.    Medications: Scheduled Meds:    . enoxaparin (LOVENOX) injection  40 mg Subcutaneous Q24H  . hydrocerin   Topical BID  . hydrocortisone valerate cream   Topical BID  . levofloxacin  500 mg Oral Q24H  . pantoprazole  40 mg Oral Q1200  . polyethylene glycol  17 g Oral Daily  . triamterene-hydrochlorothiazide  1 each Oral Daily  . DISCONTD: levofloxacin  750 mg Oral Q24H  . DISCONTD: senna-docusate  1 tablet Oral QHS   Continuous Infusions:   PRN Meds:.acetaminophen, oxyCODONE, promethazine, traMADol, zolpidem Assessment/Plan: 51 y.o woman PMH HTN, phyllodes tumor left breast, fibroid uterus s/p hysterectomy and right ovary  resection, anemia of chronic disease, multiple PE/DVT who presented 8/26 with abdominal pain, fever.     1. Epigastric abdominal pain -pt remains afebrile  -associated with nausea and loose stool x 1 8/29, elevated liver enzymes (AST 64-->45, ALT 144->134, AP 130-->126, and ggt (73) as well  -lipase negative  -etiology could be 2/2 acute pyelonephritis but this is not typical vs stress ulcer vs obstipation; pt noted to have diverticula on CT this admission and noted previously on colonoscopy 2010 -started pt on Protonix, pain Roxi 5 mg q 6 prn -Hepatitis panel neg -ordered C. Diff stool  -ordered upright (pending) and supine (resulted) ab xray  -consider upper endoscopy in the am if not better   2. Acute Pyelonephritis  -noted on CT (right>left (minimal)) -pain with oral Roxicodone 5 mg q6 prn with bowel regimen (Miralax qd)  -phernergan prn -afebrile over 24 hrs, no leukocytosis -Levoquin 500 mg (started Levo 8/29) x total of 10 days of antibx therapy (pt already received Rocephin x 3 days) -urine Cx + E.coli with sensitivities resulted   3. Elevated LFTs -hepatitis panel negative -ggt level to indicate potential cause as liver slightly elevated -overall AP stable 126, ALT trending down to 134 today, AST 45  -still unclear why LFTS elevated may be 2/2 acute infection with pyelonephritis but not typical  4. Hyperlipidemia Lipid Panel     Component Value Date/Time   CHOL 184 04/16/2012 0600   TRIG 79 04/16/2012 0600   HDL 38* 04/16/2012 0600   CHOLHDL 4.8 04/16/2012 0600   VLDL 16 04/16/2012 0600   LDLCALC 130* 04/16/2012 0600   -consider statin outpatient follow up   5. H/o HYPERTENSION  -BP 125/94 -cont Triamterene-HCTZ 37.5-25 mg qd seems to be controlling bp.   6. Adnexal mass -pt is s/p hysterectomy and right ovary removal but still has left ovary -left ovarian cysts with several simple cysts noted on US pelvis needs re-imaging in the future -spoke with radiologist  -->MRI with contrast may further characterize in the future (hard to completely characterize in obese patients) -pt will need outpatient OB/GYN referral as well   7. PULMONARY EMBOLISM, HX OF/ H/O DVT (deep venous thrombosis) -consider hypercoaguable w/u outpatient with significant PMH and FH thromboembolic events  -Consider xarelto as an option for treatment in the future   8. Anemia -chronic disease  9. F/E/N -will monitor and replace electrolytes prn -diet regular   10. Dispo -home when stable -f/u with [facility] on the day of completion of 10 day course of antibiotics    LOS: 4 days   Annett Gula [phone] 04/18/2012, 4:46 PM

## 2012-04-19 ENCOUNTER — Encounter (HOSPITAL_COMMUNITY): Payer: Self-pay | Admitting: Internal Medicine

## 2012-04-19 DIAGNOSIS — E876 Hypokalemia: Secondary | ICD-10-CM

## 2012-04-19 LAB — CBC WITH DIFFERENTIAL/PLATELET
Basophils Absolute: 0 10*3/uL (ref 0.0–0.1)
Basophils Relative: 0 % (ref 0–1)
Eosinophils Absolute: 0.1 10*3/uL (ref 0.0–0.7)
Eosinophils Relative: 1 % (ref 0–5)
HCT: 36.9 % (ref 36.0–46.0)
Hemoglobin: 12.8 g/dL (ref 12.0–15.0)
Lymphocytes Relative: 36 % (ref 12–46)
Lymphs Abs: 2.4 10*3/uL (ref 0.7–4.0)
MCH: 28.9 pg (ref 26.0–34.0)
MCHC: 34.7 g/dL (ref 30.0–36.0)
MCV: 83.3 fL (ref 78.0–100.0)
Monocytes Absolute: 0.6 10*3/uL (ref 0.1–1.0)
Monocytes Relative: 9 % (ref 3–12)
Neutro Abs: 3.5 10*3/uL (ref 1.7–7.7)
Neutrophils Relative %: 53 % (ref 43–77)
Platelets: 386 10*3/uL (ref 150–400)
RBC: 4.43 MIL/uL (ref 3.87–5.11)
RDW: 13 % (ref 11.5–15.5)
WBC: 6.5 10*3/uL (ref 4.0–10.5)

## 2012-04-19 LAB — COMPREHENSIVE METABOLIC PANEL
ALT: 111 U/L — ABNORMAL HIGH (ref 0–35)
AST: 37 U/L (ref 0–37)
Albumin: 3.3 g/dL — ABNORMAL LOW (ref 3.5–5.2)
Alkaline Phosphatase: 122 U/L — ABNORMAL HIGH (ref 39–117)
BUN: 11 mg/dL (ref 6–23)
CO2: 24 mEq/L (ref 19–32)
Calcium: 9.7 mg/dL (ref 8.4–10.5)
Chloride: 102 mEq/L (ref 96–112)
Creatinine, Ser: 1 mg/dL (ref 0.50–1.10)
GFR calc Af Amer: 73 mL/min — ABNORMAL LOW (ref >90)
GFR calc non Af Amer: 63 mL/min — ABNORMAL LOW (ref >90)
Glucose, Bld: 98 mg/dL (ref 70–99)
Potassium: 3.4 mEq/L — ABNORMAL LOW (ref 3.5–5.1)
Sodium: 137 mEq/L (ref 135–145)
Total Bilirubin: 0.2 mg/dL — ABNORMAL LOW (ref 0.3–1.2)
Total Protein: 7.5 g/dL (ref 6.0–8.3)

## 2012-04-19 LAB — CLOSTRIDIUM DIFFICILE BY PCR: Toxigenic C. Difficile by PCR: NEGATIVE

## 2012-04-19 MED ORDER — LEVOFLOXACIN 750 MG PO TABS
750.0000 mg | ORAL_TABLET | ORAL | Status: DC
  Administered 2012-04-19: 500 mg via ORAL
  Filled 2012-04-19: qty 1

## 2012-04-19 MED ORDER — LEVOFLOXACIN 750 MG PO TABS: 750.0000 mg | ORAL_TABLET | ORAL | Status: DC

## 2012-04-19 MED ORDER — PANTOPRAZOLE SODIUM 40 MG PO TBEC: 40.0000 mg | DELAYED_RELEASE_TABLET | Freq: Every day | ORAL | Status: DC

## 2012-04-19 NOTE — Discharge Summary (Signed)
Internal Medicine Teaching [facility] Discharge Note  Name: [name] MRN: [id number] DOB: Oct 07, 1960 51 y.o.  Date of Admission: 04/14/2012  6:21 PM Date of Discharge: 04/19/2012 Attending Physician: Farley Ly, MD  Discharge Diagnosis: Principal Problem:  *Pyelonephritis Active Problems:  HYPERTENSION  PULMONARY EMBOLISM, HX OF  DVT (deep venous thrombosis)  Fever  Hypokalemia  Normocytic anemia  Adnexal mass  Ovarian cyst, left  Diverticulosis   Discharge Medications: Medication List  As of 04/19/2012  3:06 PM   STOP taking these medications         naproxen 500 MG tablet      naproxen sodium 220 MG tablet         TAKE these medications         ferrous sulfate 325 (65 FE) MG tablet   Take 325 mg by mouth daily. Take with food.      levofloxacin 750 MG tablet   Commonly known as: LEVAQUIN   Take 1 tablet (750 mg total) by mouth daily.      pantoprazole 40 MG tablet   Commonly known as: PROTONIX   Take 1 tablet (40 mg total) by mouth daily at 12 noon.      triamterene-hydrochlorothiazide 37.5-25 MG per tablet   Commonly known as: MAXZIDE-25   Take 1 tablet by mouth daily.            Disposition and follow-up:   [name] was discharged from [facility] in Stable condition.  At the hospital follow up visit please address recheck of liver function test, PMH and FH of thromboembolic events and possible need for hypercoaguability work-up as outpatient in addition to assessing her need for starting Xarelto. Refer for outpatient OB/GYN for further work-up of left adnexal mass. Patient has been lost to follow up since hysterectomy   Follow-up Appointments: Follow-up Information    Schedule an appointment as soon as possible for a visit with Almyra Deforest, MD.   Contact information:   [address] [place] [address] [phone]         Discharge Orders    Future Orders Please Complete  By Expires   Discharge instructions      Comments:   Follow-up with [facility]PC within 1 week [id number]. Finish antibiotic course as prescribed.      Consultations: None    Procedures Performed:  Dg Chest 2 View  04/14/2012  *RADIOLOGY REPORT*  Clinical Data: Fever.  Abdominal pain.  CHEST - 2 VIEW  Comparison: CT and plain films of the chest 04/02/2011.  Findings: Lungs are clear.  There is cardiomegaly.  No pneumothorax or pleural fluid.  IMPRESSION: Cardiomegaly without acute disease.   Original Report Authenticated By: Bernadene Bell. D'ALESSIO, M.D.    Dg Abd 1 View  04/18/2012  *RADIOLOGY REPORT*  Clinical Data: Abdominal pain and nausea  ABDOMEN - 1 VIEW  Comparison: 04/18/2012  Findings: Single upright exam.  No free air evident.  No significant dilatation or obstruction.  Prior cholecystectomy noted.  Lung bases clear.  Degenerative changes evident of the spine.  IMPRESSION: No free air evident.  Nonobstructive bowel gas pattern.   Original Report Authenticated By: Judie Petit. Ruel Favors, M.D.    Dg Abd 1 View  04/18/2012  *RADIOLOGY REPORT*  Clinical Data: Epigastric abdominal pain.  Nausea.  ABDOMEN - 1 VIEW  Comparison: CT abdomen and pelvis 04/14/2012.  Findings: Bowel gas pattern unremarkable without evidence of obstruction or significant ileus.  Residual oral contrast  from the CT in 2 adjacent descending colon diverticula.  Phlebolith low in the left side of the pelvis.  No visible opaque urinary tract calculi.  Mild degenerative changes involving the lumbar spine.  IMPRESSION: No acute abdominal abnormality.   Original Report Authenticated By: Arnell Sieving, M.D.    US Transvaginal Non-ob  04/16/2012  *RADIOLOGY REPORT*  Clinical Data: Left adnexal mass.  Prior hysterectomy.  TRANSABDOMINAL AND TRANSVAGINAL ULTRASOUND OF PELVIS  Technique:  Both transabdominal and transvaginal ultrasound examinations of the pelvis were performed.  Transabdominal technique was performed for global imaging of  the pelvis including uterus, ovaries, adnexal regions, and pelvic cul-de-sac.  It was necessary to proceed with endovaginal exam following the transabdominal exam to visualize the ovaries.  Comparison:  03/16/2010 ultrasound, CT 10/10/2008  Findings: Uterus:  Surgically absent.  No mass at the vaginal cuff.  Endometrium: Surgically absent.  Right ovary: Not seen transabdominally or transvaginally.  No right adnexal mass.  Left ovary: 7.8 x 5.4 x 4.9 cm.  Several simple appearing cystic structures are noted with intervening ovarian tissue.  Dominant cyst measures 5.0 x 4.2 x 4.1 cm.  Other Findings:  No free fluid  IMPRESSION: Simple appearing left ovarian cysts.  Dominant cyst measures 5.0 cm in maximal diameter.  Reportedly, the patient is asymptomatic. This is almost certainly benign, but follow up ultrasound is recommended in 1 year according to the Society of Radiologists in Ultrasound 2010 Consensus Conference Statement (D Lenis Noon et al.  Management of Asymptomatic Ovarian and Other Adnexal Cysts Imaged at [place]:  Society of Radiologists in Ultrasound Consensus Conference Statement 2010. Radiology 256 (Sept 2010):  943-954.).  Alternatively, this could represent ovarian cystadenoma or potentially cystadenocarcinoma with multiple loculations, but by appearances, several individual simple appearing cysts are favored.   Original Report Authenticated By: Harrel Lemon, M.D.    US Pelvis Complete  04/16/2012  *RADIOLOGY REPORT*  Clinical Data: Left adnexal mass.  Prior hysterectomy.  TRANSABDOMINAL AND TRANSVAGINAL ULTRASOUND OF PELVIS  Technique:  Both transabdominal and transvaginal ultrasound examinations of the pelvis were performed.  Transabdominal technique was performed for global imaging of the pelvis including uterus, ovaries, adnexal regions, and pelvic cul-de-sac.  It was necessary to proceed with endovaginal exam following the transabdominal exam to visualize the ovaries.  Comparison:  03/16/2010  ultrasound, CT 10/10/2008  Findings: Uterus:  Surgically absent.  No mass at the vaginal cuff.  Endometrium: Surgically absent.  Right ovary: Not seen transabdominally or transvaginally.  No right adnexal mass.  Left ovary: 7.8 x 5.4 x 4.9 cm.  Several simple appearing cystic structures are noted with intervening ovarian tissue.  Dominant cyst measures 5.0 x 4.2 x 4.1 cm.  Other Findings:  No free fluid  IMPRESSION: Simple appearing left ovarian cysts.  Dominant cyst measures 5.0 cm in maximal diameter.  Reportedly, the patient is asymptomatic. This is almost certainly benign, but follow up ultrasound is recommended in 1 year according to the Society of Radiologists in Ultrasound 2010 Consensus Conference Statement (D Lenis Noon et al.  Management of Asymptomatic Ovarian and Other Adnexal Cysts Imaged at [place]:  Society of Radiologists in Ultrasound Consensus Conference Statement 2010. Radiology 256 (Sept 2010):  [phone].).  Alternatively, this could represent ovarian cystadenoma or potentially cystadenocarcinoma with multiple loculations, but by appearances, several individual simple appearing cysts are favored.   Original Report Authenticated By: Harrel Lemon, M.D.    Ct Abdomen Pelvis W Contrast  04/14/2012  *RADIOLOGY REPORT*  Clinical Data: Abdominal pain  CT ABDOMEN AND PELVIS WITH CONTRAST  Technique:  Multidetector CT imaging of the abdomen and pelvis was performed following the standard protocol during bolus administration of intravenous contrast.  Contrast: OMNIPAQUE IOHEXOL 300 MG/ML  SOLN  Comparison: 10/10/2008  Findings: Mild bibasilar scarring or atelectasis.  Low attenuation of the liver is nonspecific post contrast however suggests fatty infiltration.  No focal lesion identified in the absent gallbladder.  No biliary ductal dilatation.  Unremarkable spleen with the exception of a punctate calcification. Unremarkable pancreas and adrenal glands.  Lobular renal contours with  heterogeneous/striated enhancement.  No hydronephrosis or hydroureter.  No bowel obstruction.  No CT evidence for colitis.  Colonic diverticulosis.  Normal appendix.  No free intraperitoneal air or fluid.  The normal caliber vasculature.  Thin-walled bladder. Nonspecific 6.5 cm left adnexal cyst.  Absent uterus.  No pelvic adenopathy.  Lower thoracic and L3-4 degenerative changes.  No acute osseous finding.  IMPRESSION: Striated renal enhancement is a pattern that can be seen with pyelonephritis (favored) or infarctions. No abscess.  Low attenuation of the liver is nonspecific post contrast however suggests steatosis.  Nonspecific left adnexal/ovarian cyst measures up to 6.5 cm. Recommend a non emergent pelvic ultrasound follow-up.   Original Report Authenticated By: Waneta Martins, M.D.     Admission HPI: Patient is a 51 y.o. female with a PMHx of HTN, recurrent DVT/PE, who presented to [facility] ED for evaluation of abdominal pain, and was subsequently transferred to [facility] IMT[facility] for suspected pyelonephritis.  Ms. Cowens states her abdominal pain began on 04/11/2012, was epigastric and radiated to her back, and was 10/10 in severity. She describes the pain as being both dull and at at times sharp. She states the pain has been more or less constant since that time, causing her significant nausea which has prevented her from eating or drinking regularly, but denies any vomiting. She states nothing has improved the pain, and has tried naproxen and another pain medication at home (unsure of name). Patient denies changes in bowel habits.  Evaluation in the [facility] ED included a UA which revealed pyuria, bacteruria, and hematuria, as well as a CT abdomen which revealed findings suggestive of pyelonephritis. Patient was subsequently transferred to [facility] for admission by IMTS as she is an IMTS clinic patient.  Admission Physical Exam:  General:  Vital signs reviewed and noted. Well-developed, well-nourished, in mild distress  secondary to pain.   Head:  Normocephalic, atraumatic.   Eyes:  PERRL, EOMI, No signs of anemia or jaundice.   Nose:  Mucous membranes moist, not inflammed, nonerythematous.   Throat:  Oropharynx nonerythematous, no exudate appreciated.   Neck:  No deformities, masses, or tenderness noted. Supple.   Lungs:  Normal respiratory effort. Clear to auscultation BL without crackles or wheezes.   Heart:  RRR. S1 and S2 normal.   Abdomen:  Tender to palpation of left abdomen, with rebound tenderness. Flank tenderness on right. BS normoactive.   Extremities Trace, symmetric pretibial edema bilaterally.   Skin:  Scaling, non-erythematous lesions present in multiple areas, consistent with psoriasis.      Hospital Course by problem list:  1. Acute Pyelonephritis  Secondary to E.coli with resulted sensitivities. Radiologist confirmed CT showed evidence of right but also possible minimal left pyelo. She was initially at [facility] and given one dose of Kelfex 500 mg x 1. She was started on Rocephin started 8/27 at [facility]. Pain was controlled with morphine 2 mg q4, Ultram 50 mg q6, Tylenol  650 mg q 4. Patient did not like the way Dilaudid made her feel in the ED. Eventually Tylenol was discontinued for pain control due to elevated liver enzymes. She was initially managed on intravenous fluids of normal saline 125 cc/hr  Until she could have adequate oral intake. Her T max of 103 F at [place] long resolved this admission and she has been afebrile throughout. 1/2 aerobic bottles showed +gm +cocci in clusters. She was empirically started on Vancomycin which was discontinued when the blood culture grew Coagulase negative staph likely a contaminant.  She was given educational information about pyelonephritis and educated to wipe front to back opposed of the reverse which she had been doing prior to admission. She was discharged on a 5 day course of Levaquin 750 mg qd and is to follow-up with her PCP in the  outpatient Clinic within 1 week.  2. Epigastric abdominal pain  Her abdominal pain was 6/10 associated with nausea and loose stool x 1 8/29. Lipase was negative. Etiology could be 2/2 acute pyelonephritis but this is not typical vs stress ulcer vs obstipation. Paitent noted to have diverticula on CT this admission and noted previously on colonoscopy 2010. We ordered a two view abdomen 8/30 which did not show evidence of free air or bowel obstruction. She was started on Protonix EC 40 mg and continued to take Roxicodone 5 mg q 6 prn for pain C.difficile returned negative. Hepatitis panel was negative. She has had elevated liver enzymes this admission which are overall trended down or remained stable.  She had an elevated ggt (73) this admission. Pt was advised to avoid Tylenol for pain.    Lab 04/19/12 0457 04/18/12 0555 04/17/12 0630 04/16/12 0600 04/14/12 1945  AST 37 45* 64* 78* 87*  ALT 111* 134* 144* 149* 121*  ALKPHOS 122* 126* 130* 115 105  BILITOT 0.2* 0.2* 0.3 0.3 0.6  PROT 7.5 7.5 7.3 6.8 7.5  ALBUMIN 3.3* 3.2* 3.0* 2.8* 3.3*  INR -- -- -- -- --    3. Hyperlipidemia: Consider diet and weight modifications. Lipid Panel    Component  Value  Date/Time    CHOL  184  04/16/2012 0600    TRIG  79  04/16/2012 0600    HDL  38*  04/16/2012 0600    CHOLHDL  4.8  04/16/2012 0600    VLDL  16  04/16/2012 0600    LDLCALC  130*  04/16/2012 0600      4. H/o HYPERTENSION: Blood pressure overall controlled this admission on Triamterinene-HCTZ 37.5-25 mg   5. PULMONARY EMBOLISM, HX and H/O DVT (deep venous thrombosis), multiple Patient is no longer on Coumadin. She previously took Coumadin from 2004-2011. Her last clot was in 2004. There was not association with contraception and her blood clots and she has a significant family history of blood clots.  She may need hypercoaguable work up as outpatient given her significant PMH and FH thromboembolic events. Consider initiation of Xarelto as  outpatient.  6. Anemia: anemia panel this admission consistent with anemia of chronic disease   7. Adnexal mass, left  US pelvis/transvaginal performed shows left ovarian cyst 7.8x5.4x4.9 cm with several simple cystic structures with intervening ovarian tissues with dominant cyst 5.0 x 4.2 x 4.1 cm. Follow up in 1 year is recommended; consider benign vs cystadenoma vs cysadenocarcinoma. Dr. Desma Maxim spoke with radiologist 8/29 who recommends if the patient is premenopausal follow up <1 year. She recommends re-imaging after a complete cycle. If the cyst  is functional it will have resolved completely by that time and thus no need to follow up; if the patient is postmenopausal resolution is unlikely to occur and there is the possibility of septation; in postmenoupausal women the lesion is most likely benign and one doesn't necessarily have to follow up in 1 year. The patient noted this admission that she notices abdominal distension, pink streaks of blood, breast tenderness around what would be her menstrual cycle but she is post hysterectomy and right ovary resection. Overall the radiologist recommended trying to characterize the left cyst better with MRI with contrast given her obesity and difficulty to visualize on ultrasound imagining. This can be done outpatient. The patient wanted to proceed with further imaging as soon as possible due to a family history of cancer and she does not want to wait 1 year. Refer for outpatient OB/GYN for further work-up of left adnexal mass. Patient has been lost to follow up since hysterectomy    8. Hypokalemia: adequately supplemented prior to discharge. Likely secondary to thiazide diuretic. Recheck potassium level on follow-up. Consider outpt supplementation.    Discharge Vitals:  BP 134/78  Pulse 85  Temp 98.6 F (37 C) (Oral)  Resp 18  Ht 5\' 11"  (1.803 m)  Wt 278 lb (126.1 kg)  BMI 38.77 kg/m2  SpO2 100%  Discharge Labs:  Results for orders placed  during the hospital encounter of 04/14/12 (from the past 24 hour(s))  CLOSTRIDIUM DIFFICILE BY PCR     Status: Normal   Collection Time   04/18/12  5:57 PM      Component Value Range   C difficile by pcr NEGATIVE  NEGATIVE  COMPREHENSIVE METABOLIC PANEL     Status: Abnormal   Collection Time   04/19/12  4:57 AM      Component Value Range   Sodium 137  135 - 145 mEq/L   Potassium 3.4 (*) 3.5 - 5.1 mEq/L   Chloride 102  96 - 112 mEq/L   CO2 24  19 - 32 mEq/L   Glucose, Bld 98  70 - 99 mg/dL   BUN 11  6 - 23 mg/dL   Creatinine, Ser 4.09  0.50 - 1.10 mg/dL   Calcium 9.7  8.4 - 81.1 mg/dL   Total Protein 7.5  6.0 - 8.3 g/dL   Albumin 3.3 (*) 3.5 - 5.2 g/dL   AST 37  0 - 37 U/L   ALT 111 (*) 0 - 35 U/L   Alkaline Phosphatase 122 (*) 39 - 117 U/L   Total Bilirubin 0.2 (*) 0.3 - 1.2 mg/dL   GFR calc non Af Amer 63 (*) >90 mL/min   GFR calc Af Amer 73 (*) >90 mL/min  CBC WITH DIFFERENTIAL     Status: Normal   Collection Time   04/19/12  4:57 AM      Component Value Range   WBC 6.5  4.0 - 10.5 K/uL   RBC 4.43  3.87 - 5.11 MIL/uL   Hemoglobin 12.8  12.0 - 15.0 g/dL   HCT 91.4  78.2 - 95.6 %   MCV 83.3  78.0 - 100.0 fL   MCH 28.9  26.0 - 34.0 pg   MCHC 34.7  30.0 - 36.0 g/dL   RDW 21.3  08.6 - 57.8 %   Platelets 386  150 - 400 K/uL   Neutrophils Relative 53  43 - 77 %   Neutro Abs 3.5  1.7 - 7.7 K/uL   Lymphocytes Relative 36  12 - 46 %   Lymphs Abs 2.4  0.7 - 4.0 K/uL   Monocytes Relative 9  3 - 12 %   Monocytes Absolute 0.6  0.1 - 1.0 K/uL   Eosinophils Relative 1  0 - 5 %   Eosinophils Absolute 0.1  0.0 - 0.7 K/uL   Basophils Relative 0  0 - 1 %   Basophils Absolute 0.0  0.0 - 0.1 K/uL    Signed: Newt Levingston 04/19/2012, 3:06 PM   Time Spent on Discharge: >96min

## 2012-04-19 NOTE — Progress Notes (Addendum)
Off Service Note  51 y.o woman PMH HTN, phyllodes tumor left breast, fibroid uterus s/p hysterectomy, anemia of chronic disease, multiple PE/DVT who presented 8/26 with abdominal pain, fever of 103 F at [facility].  She was given Keflex 500 mg x 1 and transferred to [facility] for further care.    1. Epigastric abdominal pain  This is the reason we did not discharge the patient 8/30.  Her abdominal pain was 6/10 associated with nausea and loose stool x 1 8/29.  Lipase was negative.  Etiology could be 2/2 acute pyelonephritis but this is not typical vs stress ulcer vs obstipation.  Paitent noted to have diverticula on CT this admission and noted previously on colonoscopy 2010.  We ordered a two view abdomen 8/30 which did not show evidence of free air or bowel obstruction.  She was started on Protonix EC 40 mg and continued to take pain Roxicodone 5 mg q 6 prn for pain.  We ordered a C.difficile which is a less likely etiology and is pending.  Hepatitis panel was negative.  She has had elevated liver enzymes this admission. Which are overall trending down or stable (AST 64-->45, ALT 144->134, AP 130-->126.  She had and elevated ggt (73) this admission.   To do: -consider consult GI for upper endoscopy 8/31 if patient is not better -f/u C.diff, trend CMP   2. Acute Pyelonephritis  Secondary to E.coli with resulted sensitivities. Radiologist confirmed CT showed evidence of right but also possible minimal left pyelo.  She was initially at [facility] and given one dose of Kelfex 500 mg x 1.  She was started on Rocephin started 8/27 at [facility].  Pain was controlled with morphine 2 mg q4, Ultram 50 mg q6, Tylenol 650 mg q 4.  Patient did not like the way Dilaudid may her feel in the ED.  Eventually Tylenol was discontinued for pain control due to elevated liver enzymes.   She was initially placed of intravenous fluids of normal saline 125 cc/hr which were later discontinued because the patient had  adequate oral intake.  Her T max of 103 F at [place] long resolved this admission and she has been afebrile.  1/2 aerobic bottles showed +gm +cocci in clusters.  She was empirically started on Vancomycin which was discontinued when the blood culture grew Coagulase negative staph likely a contaminant.  She was given educational information about pyelonephritis and educated to wipe front to back opposed of the reverse which she had been doing prior to admission.    To Do:  Complete 10 days total course of antibiotics Levoquin 500 mg daily Print Coupon from [facility]  Schedule follow up in Internal Medicine Clinic on the last day or 1 day after completion of the course of antibiotics  3. Elevated liver enzymes ALT stable 149-->134, AP continues to trend up 115-->130-->126, AST trending down 87-->45 Hepatitis panel was negative. Avoid Tylenol for pain.   4. Hyperlipidemia Lipid Panel     Component Value Date/Time   CHOL 184 04/16/2012 0600   TRIG 79 04/16/2012 0600   HDL 38* 04/16/2012 0600   CHOLHDL 4.8 04/16/2012 0600   VLDL 16 04/16/2012 0600   LDLCALC 130* 04/16/2012 0600   To do: Review with patient outpatient.   Consider diet and weight modifications and give information   5. H/o HYPERTENSION  Blood pressure overall controlled this admission on Triamterinene-HCTZ 37.5-25 mg   6. PULMONARY EMBOLISM, HX and H/O DVT (deep venous thrombosis), multiple  Patient is no longer on Coumadin.  She previously took Coumadin from 2004-2011.  Her last clot was in 2004.  There was not association with contraception and her blood clots and she has a significant family history of blood clots.    To do:  She may need hypercoaguable w/u outpatient with significant PMH and FH thromboembolic events. Consider Xarelto  7. Anemia  -anemia panel this admission consistent with anemia of chronic disease  8. Adnexal mass, left US pelvis/transvaginal performed shows left ovarian cyst 7.8x5.4x4.9 cm with several  simple cystic structures with intervening ovarian tissues with dominant cyst 5.0 x 4.2 x 4.1 cm. Follow up in 1 year is recommended; consider benign vs cystadenoma vs cysadenocarcinoma. I spoke with radiologist 8/29 who discussed that it is hard to tell on this imaging but she recommends if the patient is premenopausal follow up <1 year. She recommends re-imaging after a complete cycle if the cyst is functional it will have resolved completely no need to follow up; if the patient is postmenopausal resolution is unlikely to occur and there is the possibility of septation; in postmenoupausal women the lesion is most likely benign and one doesn't necessarily have to follow up in 1 year. The patient noted this admission that she notices abdominal distension, pink streaks of blood, breast tenderness around what would be her menstrual cycle but she is post hysterectomy and right ovary resection.    To do: -Overall the radiologist recommended trying to the characterize the left cyst better with MRI with contrast because in obese patients it can be hard to tell and an MRI may further characterize.  This can be done outpatient.  The patient wanted proceed with further imaging as soon as possible due to a family history of cancer and she does not want to wait 1 year.    -Refer for outpatient OB/GYN; Patient has been lost to follow up since hysterectomy  9. Hypokalemia  We replaced her potassium the admission  10. Dispo Home when stable  Shirlee Latch MD

## 2012-04-19 NOTE — Discharge Instructions (Signed)
Pyelonephritis, Adult Pyelonephritis is a kidney infection. A kidney infection can happen quickly, or it can last for a long time. HOME CARE   Take your medicine (antibiotics) as told. Finish it even if you start to feel better.   Keep all doctor visits as told.   Drink enough fluids to keep your pee (urine) clear or pale yellow.   Only take medicine as told by your doctor.  GET HELP RIGHT AWAY IF:   You have a fever.   You cannot take your medicine or drink fluids as told.   You have chills and shaking.   You feel very weak or pass out (faint).   You do not feel better after 2 days.  MAKE SURE YOU:  Understand these instructions.   Will watch your condition.   Will get help right away if you are not doing well or get worse.  Document Released: 09/13/2004 Document Revised: 07/26/2011 Document Reviewed: 01/24/2011 [employer] Patient Information 2012 [employer]. 

## 2012-04-21 LAB — CULTURE, BLOOD (ROUTINE X 2): Culture: NO GROWTH

## 2012-04-22 LAB — GI PATHOGEN PANEL BY PCR, STOOL
C difficile toxin A/B: NEGATIVE
Campylobacter by PCR: POSITIVE
Cryptosporidium by PCR: NEGATIVE
E coli (ETEC) LT/ST: NEGATIVE
E coli (STEC): NEGATIVE
E coli 0157 by PCR: NEGATIVE
G lamblia by PCR: NEGATIVE
Norovirus GI/GII: NEGATIVE
Rotavirus A by PCR: NEGATIVE
Salmonella by PCR: NEGATIVE
Shigella by PCR: NEGATIVE

## 2012-04-23 NOTE — Progress Notes (Signed)
Utilization review completed. Tamra Koos, RN, BSN. 

## 2012-04-29 ENCOUNTER — Encounter: Payer: Self-pay | Admitting: Internal Medicine

## 2012-04-29 ENCOUNTER — Ambulatory Visit (INDEPENDENT_AMBULATORY_CARE_PROVIDER_SITE_OTHER): Payer: Self-pay | Admitting: Internal Medicine

## 2012-04-29 VITALS — BP 125/81 | HR 76 | Temp 97.6°F | Ht 71.0 in | Wt 277.6 lb

## 2012-04-29 DIAGNOSIS — N83202 Unspecified ovarian cyst, left side: Secondary | ICD-10-CM

## 2012-04-29 DIAGNOSIS — N83209 Unspecified ovarian cyst, unspecified side: Secondary | ICD-10-CM

## 2012-04-29 DIAGNOSIS — D649 Anemia, unspecified: Secondary | ICD-10-CM

## 2012-04-29 DIAGNOSIS — Z86718 Personal history of other venous thrombosis and embolism: Secondary | ICD-10-CM

## 2012-04-29 DIAGNOSIS — N6009 Solitary cyst of unspecified breast: Secondary | ICD-10-CM

## 2012-04-29 DIAGNOSIS — I10 Essential (primary) hypertension: Secondary | ICD-10-CM

## 2012-04-29 DIAGNOSIS — Z0000 Encounter for general adult medical examination without abnormal findings: Secondary | ICD-10-CM

## 2012-04-29 DIAGNOSIS — Z86711 Personal history of pulmonary embolism: Secondary | ICD-10-CM

## 2012-04-29 DIAGNOSIS — E876 Hypokalemia: Secondary | ICD-10-CM

## 2012-04-29 DIAGNOSIS — I82409 Acute embolism and thrombosis of unspecified deep veins of unspecified lower extremity: Secondary | ICD-10-CM

## 2012-04-29 LAB — COMPLETE METABOLIC PANEL WITH GFR
ALT: 22 U/L (ref 0–35)
AST: 15 U/L (ref 0–37)
Albumin: 3.8 g/dL (ref 3.5–5.2)
Alkaline Phosphatase: 90 U/L (ref 39–117)
BUN: 13 mg/dL (ref 6–23)
CO2: 25 mEq/L (ref 19–32)
Calcium: 10.1 mg/dL (ref 8.4–10.5)
Chloride: 100 mEq/L (ref 96–112)
Creat: 1.08 mg/dL (ref 0.50–1.10)
GFR, Est African American: 68 mL/min
GFR, Est Non African American: 59 mL/min — ABNORMAL LOW
Glucose, Bld: 98 mg/dL (ref 70–99)
Potassium: 3.6 mEq/L (ref 3.5–5.3)
Sodium: 137 mEq/L (ref 135–145)
Total Bilirubin: 0.4 mg/dL (ref 0.3–1.2)
Total Protein: 7.4 g/dL (ref 6.0–8.3)

## 2012-04-29 LAB — CBC
HCT: 37 % (ref 36.0–46.0)
Hemoglobin: 12.7 g/dL (ref 12.0–15.0)
MCH: 29.1 pg (ref 26.0–34.0)
MCHC: 34.3 g/dL (ref 30.0–36.0)
MCV: 84.9 fL (ref 78.0–100.0)
Platelets: 405 10*3/uL — ABNORMAL HIGH (ref 150–400)
RBC: 4.36 MIL/uL (ref 3.87–5.11)
RDW: 13.6 % (ref 11.5–15.5)
WBC: 4.6 10*3/uL (ref 4.0–10.5)

## 2012-04-29 MED ORDER — EUCERIN EX CREA: TOPICAL_CREAM | Freq: Two times a day (BID) | CUTANEOUS | Status: AC | PRN

## 2012-04-29 MED ORDER — HYDROCORTISONE VALERATE 0.2 % EX OINT: TOPICAL_OINTMENT | Freq: Two times a day (BID) | CUTANEOUS | Status: DC

## 2012-04-29 MED ORDER — WARFARIN SODIUM 5 MG PO TABS: 5.0000 mg | ORAL_TABLET | Freq: Every day | ORAL | Status: DC

## 2012-04-29 NOTE — Assessment & Plan Note (Addendum)
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  -Stable -Referred for mammogram

## 2012-04-29 NOTE — Assessment & Plan Note (Addendum)
History of multiple PEs.  Was on Coumadin in the past and discontinued Coumadin by herself at the end of 2011.  History of noncompliance per Dr. Michaell Cowing.  Did not wish to restart Coumadin at this time however was counseled extensively and agreed to start Coumadin for the time being and may consider converting to Xarelto if appropriate after hematology consult and confirmation of new insurance plan due to the cost concerns.  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    -Followup hematology consult, may consider starting Xarelto if appropriate -Start Coumadin 5 mg daily, return to clinic for followup appointment with Dr. Michaell Cowing on Monday 05/05/2002 for PT/INR check and adjustment of Coumadin dose

## 2012-04-29 NOTE — Assessment & Plan Note (Signed)
Found to have large left ovarian cyst on imaging on recent hospital admission.  Refuses to follow with Dr. Clearance Coots in OB/GYN but has agreed to follow with new OB/GYN.  -Followup OB/GYN consult -Return to clinic for followup appointment in one month

## 2012-04-29 NOTE — Assessment & Plan Note (Signed)
History of multiple PEs.  Was on Coumadin in the past and discontinued Coumadin by herself at the end of 2011.  History of noncompliance per Dr. Michaell Cowing.  Did not wish to restart Coumadin at this time however was counseled extensively and agreed to start Coumadin for the time being and may consider converting to Xarelto if appropriate after hematology consult and confirmation of new insurance plan due to the cost concerns.  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    -Followup hematology consult, may consider starting Xarelto if appropriate -Start Coumadin 5 mg once a day to day, return to clinic for followup appointment Dr. gross on Monday 05/05/2002 for PT/INR check and adjustment of Coumadin dose

## 2012-04-29 NOTE — Patient Instructions (Addendum)
Please take Coumadin 5mg  PO QD and return to coumadin clinic with Dr. Michaell Cowing on Monday 05/05/12 for follow up and check of INR level  Please follow up with hematology consult  Please update insurance information  Will follow up with LFTs  Please follow up with OBGYN

## 2012-04-29 NOTE — Assessment & Plan Note (Signed)
Blood pressure today 125/81, stable  - Continue Maxzide-25 - Continue to monitor

## 2012-04-29 NOTE — Assessment & Plan Note (Signed)
H&H stable at this time.  On chronic oral iron therapy  -Continue iron therapy -Continue to monitor

## 2012-04-29 NOTE — Progress Notes (Signed)
Subjective:   Patient ID: [name] female   DOB: 11/21/1960 51 y.o.   MRN: [id number]  HPI: [name] is a 51 y.o. African American female with past medical history of hypertension, breast mass, total abdominal hysterectomy, anemia, history of DVT and multiple pulmonary embolism, and eczema presenting to clinic for followup after recent hospitalization for pyelonephritis.  This [name] has completed her 5 day course of antibiotics.  She claims to have resolution of symptoms since since discharge from the hospital.  She does note one episode of slight crampy abdominal pain yesterday that spontaneously resolved.  Otherwise he has no major complaints at this time.  Due to her significant history of pulmonary embolism and family history of hypercoagulability, this [name] was also told to followup in clinic in regards to starting anticoagulation treatment.  Review of her previous records report hypocoagulable workup in the past when she took herself off Coumadin. At that time she was found to low levels of cardiolipin antibodies.  This has the does not wish to restart Coumadin at this time and is inquiring about Cirella toe however she currently does not have insurance and needs to arrange insurance with her husband's plan.  She has no other major complaints at this time. She denies any headaches, fever, chills, nausea, vomiting, diarrhea, constipation, chest pain, shortness of breath, or any urinary complaints at this time.      Past Medical History  Diagnosis Date  . Hypertension   . Breast mass 02/2008     Noted on mammogram March 11, 2008 3 cm simple cyst at 9 to 10:00 position of the right breast as well as multiple other smaller simple cyst, 2.6 cm mass at the 10:00 position of the right breast also representing complex cyst,  . 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 Fibroid uterus  2001     status post total abdominal hysterectomy, right and left salpingo-oophorectomy , done by Dr. Clearance Coots  . Anemia 2009     Baseline hemoglobin at 11, secondary to iron deficiency  . Deep vein thrombosis (DVT)      history of multiple  DVTs in 1989, 1989, 2004, on chronic anticoagulation with Coumadin  . PE (pulmonary embolism)      history of multiple PEs in 1984, 1989, 2004-  electronic data reviewed in [facility] and EMR and I was not able to find single CT angiogram that was positive for pulmonary embolus nor was I able to find Doppler studies that were positive for DVTs  . Diverticulosis     noted colonoscopy 2010  . Eczema    Current Outpatient Prescriptions  Medication Sig Dispense Refill  . ferrous sulfate 325 (65 FE) MG tablet Take 325 mg by mouth daily. Take with food.       . pantoprazole (PROTONIX) 40 MG tablet Take 1 tablet (40 mg total) by mouth daily at 12 noon.  30 tablet  0  . triamterene-hydrochlorothiazide (MAXZIDE-25) 37.5-25 MG per tablet Take 1 tablet by mouth daily.      Marland Kitchen warfarin (COUMADIN) 5 MG tablet Take 1 tablet (5 mg total) by mouth daily.  30 tablet  11  . hydrocortisone valerate ointment (WESTCORT) 0.2 % Apply topically 2 (two) times daily.  45 g  1  . Skin  Protectants, Misc. (EUCERIN) cream Apply topically 2 (two) times daily as needed for dry skin.  454 g  1   Family History  Problem Relation Age of Onset  . Stroke Neg Hx   . Cancer    . Clotting disorder     History   Social History  . Marital Status: Married    Spouse Name: N/A    Number of Children: N/A  . Years of Education: N/A   Social History Main Topics  . Smoking status: Never Smoker   . Smokeless tobacco: None  . Alcohol Use: No  . Drug Use: No  . Sexually Active: None   Other Topics Concern  . None   Social History Narrative  . None    Review of Systems: Constitutional: Denies fever, chills, diaphoresis, appetite change and fatigue.  HEENT: Denies photophobia, eye pain, redness, hearing loss, ear pain, congestion, sore throat, rhinorrhea, sneezing, mouth sores, trouble swallowing, neck pain, neck stiffness and tinnitus.   Respiratory: Denies SOB, DOE, cough, chest tightness,  and wheezing.   Cardiovascular: Denies chest pain, palpitations and leg swelling.  Gastrointestinal: + Slight abdominal pain Denies nausea, vomiting, diarrhea, constipation, blood in stool and abdominal distention.  Genitourinary: + Total abdominal hysterectomy, left ovarian cyst. Denies dysuria, urgency, frequency, hematuria, flank pain and difficulty urinating.  Musculoskeletal: + History of left knee pain. Denies back pain, joint swelling, and gait problem.  Skin: + Patches of Eczema on bilateral lower extremities upper extremities and back. Denies pallor, rash and wound.  Neurological: Denies dizziness, seizures, syncope, weakness, light-headedness, numbness and headaches.  Hematological: Denies adenopathy. + History of blood clots/PE  Psychiatric/Behavioral: Denies suicidal ideation, mood changes, confusion, nervousness, sleep disturbance and agitation  Objective:  Physical Exam: Filed Vitals:   04/29/12 0955  BP: 125/81  Pulse: 76  Temp: 97.6 F (36.4 C)  TempSrc: Oral  Height: 5\' 11"  (1.803 m)  Weight: 277 lb 9.6 oz (125.919 kg)  SpO2: 98%   Constitutional: Vital signs reviewed.  Patient is a well-developed and well-nourished female in no acute distress and cooperative with exam. Alert and oriented x3.  Head: Normocephalic and atraumatic Ear: TM normal bilaterally Mouth: no erythema or exudates, MMM Eyes: PERRLA, EOMI, conjunctivae normal, No scleral icterus.  Neck: Supple, Trachea midline normal ROM Cardiovascular: RRR, S1 normal, S2 normal, no MRG, pulses symmetric and intact bilaterally Pulmonary/Chest: CTAB, no wheezes, rales,  or rhonchi Abdominal: Soft. Slight tender to palpation of epigastric region, non-distended, bowel sounds are normal, no masses, organomegaly, or guarding present.  GU: no CVA tenderness Musculoskeletal: No joint deformities, erythema, or stiffness.  Hematology: no cervical, inginal, or axillary adenopathy.  Neurological: A&O x3, Strength is normal and symmetric bilaterally, cranial nerve II-XII are grossly intact, no focal motor deficit, sensory intact to light touch bilaterally.  Skin: Warm, dry and intact. No cyanosis, or clubbing. + Patches of dry skin on bilateral lower extremities and back  Psychiatric: Normal mood and affect. speech and behavior is normal. Judgment and thought content normal. Cognition and memory are normal.   Assessment & Plan:

## 2012-04-30 ENCOUNTER — Telehealth: Payer: Self-pay | Admitting: Internal Medicine

## 2012-04-30 NOTE — Telephone Encounter (Signed)
S/W pt in re NP appt 9/25 @ 9:30 w/Dr. Arbutus Ped. Referring Dr. Virgina Organ  Dx- Hx of PE NP packet mail

## 2012-05-01 NOTE — Progress Notes (Signed)
INTERNAL MEDICINE TEACHING ATTENDING ADDENDUM - Jonah Blue, DO : I personally saw and evaluated [name] in this clinic visit in conjunction with the resident, Dr. Virgina Organ. I have discussed patient's plan of care with medical resident during this visit. I have confirmed the physical exam findings and have read and agree with the clinic note including the plan.

## 2012-05-05 ENCOUNTER — Ambulatory Visit: Payer: Self-pay

## 2012-05-14 ENCOUNTER — Other Ambulatory Visit: Payer: Self-pay | Admitting: Lab

## 2012-05-14 ENCOUNTER — Ambulatory Visit: Payer: Self-pay | Admitting: Internal Medicine

## 2012-05-14 ENCOUNTER — Ambulatory Visit: Payer: Self-pay

## 2012-05-19 ENCOUNTER — Ambulatory Visit: Payer: Self-pay | Admitting: Internal Medicine

## 2012-05-19 NOTE — Addendum Note (Signed)
Addended by: Neomia Dear on: 05/19/2012 06:38 PM   Modules accepted: Orders

## 2012-05-21 ENCOUNTER — Encounter: Payer: Self-pay | Admitting: Obstetrics & Gynecology

## 2012-05-27 NOTE — Addendum Note (Signed)
Addended by: Neomia Dear on: 05/27/2012 05:26 PM   Modules accepted: Orders

## 2012-06-30 ENCOUNTER — Other Ambulatory Visit: Payer: Self-pay | Admitting: Internal Medicine

## 2012-06-30 DIAGNOSIS — Z1231 Encounter for screening mammogram for malignant neoplasm of breast: Secondary | ICD-10-CM

## 2012-07-28 ENCOUNTER — Ambulatory Visit: Payer: Self-pay

## 2012-09-17 ENCOUNTER — Other Ambulatory Visit: Payer: Self-pay | Admitting: *Deleted

## 2012-09-17 DIAGNOSIS — I10 Essential (primary) hypertension: Secondary | ICD-10-CM

## 2012-09-18 ENCOUNTER — Other Ambulatory Visit: Payer: Self-pay | Admitting: Internal Medicine

## 2012-09-18 MED ORDER — TRIAMTERENE-HCTZ 37.5-25 MG PO TABS: 1.0000 | ORAL_TABLET | Freq: Every day | ORAL | Status: DC

## 2012-09-29 ENCOUNTER — Ambulatory Visit: Payer: Self-pay | Admitting: Internal Medicine

## 2012-10-20 ENCOUNTER — Other Ambulatory Visit: Payer: Self-pay | Admitting: Internal Medicine

## 2012-10-20 DIAGNOSIS — I10 Essential (primary) hypertension: Secondary | ICD-10-CM

## 2012-11-03 ENCOUNTER — Encounter: Payer: Self-pay | Admitting: Internal Medicine

## 2012-11-05 ENCOUNTER — Encounter: Payer: Self-pay | Admitting: Internal Medicine

## 2012-11-10 ENCOUNTER — Encounter: Payer: Self-pay | Admitting: Internal Medicine

## 2012-11-10 ENCOUNTER — Ambulatory Visit (INDEPENDENT_AMBULATORY_CARE_PROVIDER_SITE_OTHER): Payer: Self-pay | Admitting: Internal Medicine

## 2012-11-10 VITALS — BP 137/88 | HR 62 | Temp 97.6°F | Ht 71.0 in | Wt 273.0 lb

## 2012-11-10 DIAGNOSIS — M79609 Pain in unspecified limb: Secondary | ICD-10-CM

## 2012-11-10 DIAGNOSIS — I10 Essential (primary) hypertension: Secondary | ICD-10-CM

## 2012-11-10 LAB — BASIC METABOLIC PANEL WITH GFR
BUN: 11 mg/dL (ref 6–23)
CO2: 26 mEq/L (ref 19–32)
Calcium: 9.4 mg/dL (ref 8.4–10.5)
Chloride: 104 mEq/L (ref 96–112)
Creat: 0.95 mg/dL (ref 0.50–1.10)
GFR, Est African American: 79 mL/min
GFR, Est Non African American: 69 mL/min
Glucose, Bld: 86 mg/dL (ref 70–99)
Potassium: 3.6 mEq/L (ref 3.5–5.3)
Sodium: 138 mEq/L (ref 135–145)

## 2012-11-10 MED ORDER — MELOXICAM 7.5 MG PO TABS: 7.5000 mg | ORAL_TABLET | Freq: Every day | ORAL | Status: DC

## 2012-11-10 MED ORDER — TRIAMTERENE-HCTZ 37.5-25 MG PO TABS
1.0000 | ORAL_TABLET | Freq: Every day | ORAL | Status: DC
Start: 2012-11-10 — End: 2013-02-26

## 2012-11-10 MED ORDER — HYDROCORTISONE VALERATE 0.2 % EX OINT: TOPICAL_OINTMENT | Freq: Two times a day (BID) | CUTANEOUS | Status: DC

## 2012-11-10 NOTE — Progress Notes (Signed)
Subjective:     Patient ID: [name], female   DOB: 10-Apr-1961, 52 y.o.   MRN: [id number]  HPI The patient is a 52 YO woman who comes in for some pain in her feet. She says it mostly hurts when she is walking but occasionally bothers her at rest. She states that it is about 5-6/10 and feels like pins and needles sensation. She has not tried anything for the pain at all. She has never had anything like this before and has not had any change in medicines in the last few months. She has not had fevers or chills. She has not fallen or injured her foot. She states she has not been taking coumadin for "a minute" which clarifies into at least 1 year. She is not having chest pain or SOB. She is not having any pain in her legs or calves or thighs. She has not had any swelling in her feet or legs. She has not been on any long trips recently.   Review of Systems  Constitutional: Negative for fever, chills, diaphoresis, activity change, appetite change, fatigue and unexpected weight change.  HENT: Negative.   Respiratory: Negative.  Negative for cough, chest tightness, shortness of breath and wheezing.   Cardiovascular: Negative.  Negative for chest pain, palpitations and leg swelling.  Gastrointestinal: Negative.   Endocrine: Negative for cold intolerance, heat intolerance, polydipsia, polyphagia and polyuria.  Musculoskeletal: Positive for myalgias. Negative for back pain, joint swelling, arthralgias and gait problem.       Pins and needles sensation in her feet.  Skin: Positive for color change. Negative for pallor, rash and wound.       Dry skin on heels.  Neurological: Positive for numbness. Negative for dizziness, tremors, seizures, syncope, facial asymmetry, speech difficulty, weakness, light-headedness and headaches.  Hematological: Negative.        Objective:   Physical Exam  Constitutional: She is oriented to person, place, and time. She appears well-developed and well-nourished. No  distress.  Obese  HENT:  Head: Normocephalic and atraumatic.  Eyes: EOM are normal. Pupils are equal, round, and reactive to light.  Neck: Normal range of motion. Neck supple. No JVD present. No tracheal deviation present. No thyromegaly present.  Cardiovascular: Normal rate and regular rhythm.   Pulmonary/Chest: Effort normal and breath sounds normal. No respiratory distress. She has no wheezes. She has no rales. She exhibits no tenderness.  Abdominal: Soft. Bowel sounds are normal. She exhibits no distension. There is no tenderness. There is no rebound.  Musculoskeletal: Normal range of motion. She exhibits no edema and no tenderness.  Neurological: She is alert and oriented to person, place, and time. She displays normal reflexes. No cranial nerve deficit. She exhibits normal muscle tone. Coordination normal.  Skin: Skin is warm and dry. No rash noted. She is not diaphoretic. No erythema. No pallor.  Some dry skin on her heels and on the legs bilaterally. No skin breakdown, redness, open area, warmth.       Assessment/Plan:   1. Pain in feet - Likely neuropathic in nature given the pins and needles description. She does not wish to try gabapentin today and so will trial meloxicam. Also gave her stretching exercises to help with mobility and pulling sensation in her feet. She does not have diabetes or exposure to medications well known to cause neuropathy. May be a sign of vascular disease however good pulses in feet and no ulcers. Some dry skin but no open areas. Will also  give her back medicine for her dry skin (hydrocortisone cream). Will check BMP as she has had hypokalemia in the past and electrolyte abnormality can cause some neuropathy symptoms.   2. Medication reconciliation - Patient states she is not taking (and has not been for some time) protonix or warfarin. Will forward this note to her PCP and if she should be on warfarin she may need to be brought back in for counseling.   3.  Disposition - The patient will be seen back as needed. She requested diet medication however we did not have time to discuss this and I advised her to come back with her regular doctor for that discussion. Refilled her BP medicine for 1 month and sent 1 month supply of meloxicam and hydrocortisone. She declined flu shot today but states she will get one next year.

## 2012-11-10 NOTE — Patient Instructions (Signed)
We will prescribe the cream for your rash and check on your blood. We will call you back if there are any problems.   You can use meloxicam for pain. Take 1 pill per day every day for 1-2 weeks and see if this reduces the amount of pain you have. Try warm or cooling packs to help with pain.  Here are some stretching exercises for the feet to help decrease the pain in them.  1) Towel Stretch Sit on the floor with your legs straight in front of you. Take a towel and place it around your toes. Easily pull the towel towards you. Hold for 15 to 30 seconds, then release.  Do three sets.  2) Towel Lifts Sit in a chair and place a towel on the floor. Lift the towel with your toes. Most likely you can lift it with your big toe-for a challenge try lifting it with your little toes. Repeat five times and then switch feet.  3) Step Stretch Stand with your toes on a step, your heels off the edge. Slowly lower your heels down, hold for 10 to 15 seconds, then lift your heels to starting position. Repeat five to 10 times. This is great for plantar fasciitis. If the movement is too much for both feet at once, do one foot at a time.  4) Toe Stretch Sit in a chair, with feet on the floor, and spread your toes apart. Hold for a few seconds, then release. Repeat 10 times.  5) Foot Roll Take a golf ball, and roll it back and forth from your toes to your heels. You can also use a tennis ball or a frozen juice can-your feet will love this, especially if you've been on them all day. A little stretching goes a long way and can prevent injuries. So keep stretching your feet.  Come back as needed or to discuss weight loss options with your doctor.

## 2013-01-01 ENCOUNTER — Other Ambulatory Visit: Payer: Self-pay | Admitting: Internal Medicine

## 2013-01-09 ENCOUNTER — Encounter: Payer: Self-pay | Admitting: Internal Medicine

## 2013-01-19 ENCOUNTER — Encounter: Payer: Self-pay | Admitting: Internal Medicine

## 2013-02-26 ENCOUNTER — Other Ambulatory Visit: Payer: Self-pay | Admitting: *Deleted

## 2013-02-26 DIAGNOSIS — I10 Essential (primary) hypertension: Secondary | ICD-10-CM

## 2013-02-26 MED ORDER — TRIAMTERENE-HCTZ 37.5-25 MG PO TABS: 1.0000 | ORAL_TABLET | Freq: Every day | ORAL | Status: DC

## 2013-05-12 ENCOUNTER — Encounter: Payer: Self-pay | Admitting: Internal Medicine

## 2013-05-12 ENCOUNTER — Ambulatory Visit (INDEPENDENT_AMBULATORY_CARE_PROVIDER_SITE_OTHER): Payer: BC Managed Care – PPO | Admitting: Internal Medicine

## 2013-05-12 VITALS — BP 129/88 | HR 66 | Temp 97.2°F | Ht 71.0 in | Wt 265.0 lb

## 2013-05-12 DIAGNOSIS — L259 Unspecified contact dermatitis, unspecified cause: Secondary | ICD-10-CM

## 2013-05-12 DIAGNOSIS — M129 Arthropathy, unspecified: Secondary | ICD-10-CM

## 2013-05-12 DIAGNOSIS — I10 Essential (primary) hypertension: Secondary | ICD-10-CM

## 2013-05-12 DIAGNOSIS — M1990 Unspecified osteoarthritis, unspecified site: Secondary | ICD-10-CM

## 2013-05-12 DIAGNOSIS — Z23 Encounter for immunization: Secondary | ICD-10-CM

## 2013-05-12 DIAGNOSIS — G4700 Insomnia, unspecified: Secondary | ICD-10-CM | POA: Insufficient documentation

## 2013-05-12 DIAGNOSIS — L309 Dermatitis, unspecified: Secondary | ICD-10-CM | POA: Insufficient documentation

## 2013-05-12 LAB — LIPID PANEL
Cholesterol: 198 mg/dL (ref 0–200)
HDL: 48 mg/dL (ref >39)
LDL Cholesterol: 126 mg/dL — ABNORMAL HIGH (ref 0–99)
Total CHOL/HDL Ratio: 4.1 Ratio
Triglycerides: 120 mg/dL (ref <150)
VLDL: 24 mg/dL (ref 0–40)

## 2013-05-12 LAB — URIC ACID: Uric Acid, Serum: 7.7 mg/dL — ABNORMAL HIGH (ref 2.4–7.0)

## 2013-05-12 MED ORDER — HYDROCORTISONE VALERATE 0.2 % EX OINT: TOPICAL_OINTMENT | Freq: Two times a day (BID) | CUTANEOUS | Status: DC

## 2013-05-12 MED ORDER — MELOXICAM 7.5 MG PO TABS: 7.5000 mg | ORAL_TABLET | Freq: Every day | ORAL | Status: DC

## 2013-05-12 NOTE — Assessment & Plan Note (Signed)
Patient reports she is having trouble staying asleep.  Reports she has taken a friend's flexeril before which helped.  Patient's insomnia may be due to joint pains and getting them under control may alleviate her problem.  For now I advised her of maintaining good sleep hygiene and to try benadryl OTC for sleep.

## 2013-05-12 NOTE — Progress Notes (Signed)
Patient ID: [name], female   DOB: 1960-09-07, 52 y.o.   MRN: [id number]   Subjective:   Patient ID: [name] female   DOB: February 27, 1961 52 y.o.   MRN: [id number]  HPI: [name] is a 52 y.o. female with a past medical history of HTN, multiple DVT/PE not on A/C, Diverticulosis, and Arthritis.  She presents today with chief compliant of arthritis.  Patient reports that her arthritis in both knees and feet has become unbearable.  She reports that she has pain daily, worse with activity and better with rest. She reports that Aleve does not help and she never filled prescription for Mobic.  She admits occasionally she also has associated symptoms of low back pain and pins and needles sensation in her feet.  Her worst joints are both knees and 1st metatarsal joints B/L.  She reports she is able to walk long distances and does not get cramping sensation in her muscles. In addition she reports her Eczema is back again on her lower legs, she has run out of Westcort cream which has worked well in the past. She does reports some trouble staying alseep, she reports at times she can only sleep for one hour at a time.  Her pain contributes to his not sleeping.  She has tried taking a friend's flexeril which helped her stay asleep.   She reports she has been taking her other medications as prescribed, and denies side effects.    Past Medical History  Diagnosis Date  . Hypertension   . Breast mass 02/2008     Noted on mammogram March 11, 2008 3 cm simple cyst at 9 to 10:00 position of the right breast as well as multiple other smaller simple cyst, 2.6 cm mass at the 10:00 position of the right breast also representing complex cyst,  . 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 Fibroid uterus  2001     status post total abdominal hysterectomy, right and left salpingo-oophorectomy , done by Dr. Clearance Coots  . Anemia 2009     Baseline hemoglobin at 11, secondary to iron deficiency  . Deep vein thrombosis (DVT)      history of multiple  DVTs in 1989, 1989, 2004, on chronic anticoagulation with Coumadin  . PE (pulmonary embolism)      history of multiple PEs in 1984, 1989, 2004-  electronic data reviewed in [facility] and EMR and I was not able to find single CT angiogram that was positive for pulmonary embolus nor was I able to find Doppler studies that were positive for DVTs  . Diverticulosis     noted colonoscopy 2010  . Eczema    Current Outpatient Prescriptions  Medication Sig Dispense Refill  . ferrous sulfate 325 (65 FE) MG tablet Take 325 mg by mouth daily. Take with food.       . hydrocortisone valerate ointment (WESTCORT) 0.2 % Apply topically 2 (two) times daily.  45 g  1  . meloxicam (MOBIC) 7.5 MG tablet Take 1 tablet (7.5 mg total) by mouth daily.  45 tablet  0  . pantoprazole (PROTONIX) 40 MG tablet Take 1 tablet (40 mg total) by mouth daily at 12 noon.  30 tablet  0  . triamterene-hydrochlorothiazide (MAXZIDE-25) 37.5-25 MG per tablet Take  1 tablet by mouth daily.  90 tablet  0   No current facility-administered medications for this visit.   Family History  Problem Relation Age of Onset  . Stroke Neg Hx   . Cancer    . Clotting disorder     History   Social History  . Marital Status: Married    Spouse Name: N/A    Number of Children: N/A  . Years of Education: N/A   Social History Main Topics  . Smoking status: Never Smoker   . Smokeless tobacco: None  . Alcohol Use: No  . Drug Use: No  . Sexual Activity: None   Other Topics Concern  . None   Social History Narrative  . None   Review of Systems: Review of Systems  Constitutional: Negative for fever, chills, weight loss and malaise/fatigue.  HENT: Negative for  congestion and neck pain.   Eyes: Negative for blurred vision.  Respiratory: Negative for cough, sputum production and shortness of breath.   Cardiovascular: Positive for palpitations (occasional). Negative for chest pain and leg swelling.  Gastrointestinal: Negative for heartburn, nausea, vomiting, abdominal pain, diarrhea and constipation.  Genitourinary: Negative for dysuria.  Musculoskeletal: Positive for back pain and joint pain. Negative for myalgias and falls.  Skin: Negative for rash.  Neurological: Negative for dizziness, sensory change, focal weakness and headaches.  Psychiatric/Behavioral: Negative for depression.    Objective:  Physical Exam: Filed Vitals:   05/12/13 1003 05/12/13 1053  BP: 140/88 129/88  Pulse: 73 66  Temp: 97.2 F (36.2 C)   TempSrc: Oral   Height: 5\' 11"  (1.803 m)   Weight: 265 lb (120.203 kg)   SpO2: 97%   Physical Exam  Nursing note and vitals reviewed. Constitutional: She is well-developed, well-nourished, and in no distress. No distress.  HENT:  Head: Normocephalic and atraumatic.  Eyes: Conjunctivae are normal.  Cardiovascular: Normal rate, regular rhythm, normal heart sounds and intact distal pulses.   No murmur heard. Pulmonary/Chest: Effort normal and breath sounds normal. No respiratory distress. She has no wheezes. She has no rales.  Abdominal: Soft. Bowel sounds are normal. She exhibits no distension. There is no tenderness.  Musculoskeletal: She exhibits no edema.       Right knee: She exhibits normal range of motion, no swelling, normal alignment, no LCL laxity, normal meniscus and no MCL laxity. Tenderness found. Medial joint line tenderness noted.       Left knee: She exhibits normal range of motion, no swelling, no erythema, normal alignment, no LCL laxity, normal patellar mobility, normal meniscus and no MCL laxity. Tenderness found. Medial joint line tenderness noted.  Skin: Skin is warm and dry. She is not diaphoretic.  +hair  growth on lower extremities, Dry skin on feet.  Psychiatric: Affect and judgment normal.     Assessment & Plan:   See Problem Based Assessment and Plan Meds ordered this encounter  Medications  . DISCONTD: meloxicam (MOBIC) 7.5 MG tablet    Sig: Take 1 tablet (7.5 mg total) by mouth daily.    Dispense:  45 tablet    Refill:  0  . hydrocortisone valerate ointment (WESTCORT) 0.2 %    Sig: Apply topically 2 (two) times daily.    Dispense:  45 g    Refill:  1  . meloxicam (MOBIC) 7.5 MG tablet    Sig: Take 1 tablet (7.5 mg total) by mouth daily.    Dispense:  45 tablet    Refill:  0

## 2013-05-12 NOTE — Patient Instructions (Addendum)
1.  Start taking Mobic twice a day as needed for arthritis. 2.  An appointment will be made for you at [facility]. 3.  I will call you with any abnormalities of the labs or changes in medication. 4.  Apply Westcort as needed for eczema.

## 2013-05-12 NOTE — Assessment & Plan Note (Addendum)
BP Readings from Last 3 Encounters:  05/12/13 129/88  11/10/12 137/88  04/29/12 125/81    Lab Results  Component Value Date   NA 138 11/10/2012   K 3.6 11/10/2012   CREATININE 0.95 11/10/2012    Assessment: Blood pressure control: controlled Progress toward BP goal:  at goal Comments:   Plan: Medications:  Continue Triamterene-HCTZ 37.5-25 Educational resources provided: brochure Self management tools provided: home blood pressure logbook Other plans: If Uric Acid elevated patient may have component of gout arthritis and will need to D/C HCTZ.  Addendum: Uric Acid elevated, will D/C Triamterene-HCTZ and start Amlodipine 10mg  QD.  Patient called and notified.  Will return for BMP check in 2 weeks.  Also has appointment with sports med on 05/25/13 where BP will be checked.

## 2013-05-12 NOTE — Assessment & Plan Note (Addendum)
Patient has only had Xray of left knee in the past (06/2011) which showed mild OA.  Has had rheumatologic work up in the past that was negative, but had elevated CRP and sed rate.  From previous notes there is a question of Gout and patient does complain of pain at first metatarsal joint B/L.  Will recheck Uric Acid level today and if high it may lend more evidence for gout (will also stop HCTZ).  Patient did not take Mobic the last time it was prescribed, will renew this prescription today as patient reports she now has insurance.  Will also refer her to Sports medicine for possible joint aspiration and injection. Patient does report element of low back pain and describes occasional pins and needles sensation, however straight leg raise was negative B/L and patient has no history of diabetes.  Will continue to monitor and assess improvement with Mobic.  Addendum: Uric acid elevated to 7.7, arthritis may be due to Gout, patient has appointment with Sports Medicine on 05/25/13.  Since patient is taking HCTZ I will discontinue this medication and prescribe Amlodipine 10mg  in stead.  I called and spoke to the patient and instructed her to start taking amlodipine tomorrow and stop Triamterene-HCTZ.  She will return for BMP in about 2 weeks.

## 2013-05-12 NOTE — Assessment & Plan Note (Signed)
Patient reports history of eczema that has responded well to westcort in the past.  Today she has lesions on B/L lower extremities and behind the right ear consistent with eczema.  Will refill her prescription for westcort.

## 2013-05-14 MED ORDER — AMLODIPINE BESYLATE 10 MG PO TABS: 10.0000 mg | ORAL_TABLET | Freq: Every day | ORAL | Status: DC

## 2013-05-14 NOTE — Progress Notes (Signed)
I saw and evaluated the patient.  I personally confirmed the key portions of the history and exam documented by Dr. Hoffman and I reviewed pertinent patient test results.  The assessment, diagnosis, and plan were formulated together and I agree with the documentation in the resident's note. 

## 2013-05-14 NOTE — Addendum Note (Signed)
Addended by: Carlynn Purl C on: 05/14/2013 03:26 PM   Modules accepted: Orders, Medications

## 2013-05-25 ENCOUNTER — Encounter: Payer: Self-pay | Admitting: Sports Medicine

## 2013-05-25 ENCOUNTER — Ambulatory Visit (INDEPENDENT_AMBULATORY_CARE_PROVIDER_SITE_OTHER): Payer: BC Managed Care – PPO | Admitting: Sports Medicine

## 2013-05-25 VITALS — BP 126/84 | HR 67 | Ht 71.0 in | Wt 265.0 lb

## 2013-05-25 DIAGNOSIS — M79609 Pain in unspecified limb: Secondary | ICD-10-CM

## 2013-05-25 DIAGNOSIS — M25569 Pain in unspecified knee: Secondary | ICD-10-CM

## 2013-05-25 DIAGNOSIS — M79671 Pain in right foot: Secondary | ICD-10-CM

## 2013-05-25 DIAGNOSIS — M25561 Pain in right knee: Secondary | ICD-10-CM

## 2013-05-25 DIAGNOSIS — M7741 Metatarsalgia, right foot: Secondary | ICD-10-CM | POA: Insufficient documentation

## 2013-05-25 DIAGNOSIS — M25562 Pain in left knee: Secondary | ICD-10-CM | POA: Insufficient documentation

## 2013-05-25 MED ORDER — METHYLPREDNISOLONE ACETATE 40 MG/ML IJ SUSP
40.0000 mg | Freq: Once | INTRAMUSCULAR | Status: AC
  Administered 2013-05-25: 40 mg via INTRA_ARTICULAR

## 2013-05-25 MED ORDER — DICLOFENAC SODIUM 75 MG PO TBEC: 75.0000 mg | DELAYED_RELEASE_TABLET | Freq: Two times a day (BID) | ORAL | Status: DC

## 2013-05-25 NOTE — Assessment & Plan Note (Signed)
Pt with > 6 month hx of B/L knee pain, left > right.  No red flags including trauma to the knee, inciting event, edematous/erythematous effusion present in either knee.  She is TTP along the medial joint line B/L and has had previous X-ray evidence of OA present in 2012.  Will repeat films today as she most likely has had worsening of her medial compartment DJD.  Will get A/P and flexion 30 degrees standing, lateral and sunrise and will see back in three weeks.  For her pain, will try voltaren 75 mg bid as she did not get great relief from mobic 15 mg qd.  As well, B/L knee injections today as pt was amenable to trying these for possible relief.  She understands risks of procedure that include infection, pain, arterial injection, etc and agrees to these risks.    Consent obtained and verified. Alcohol preparation x 3 Topical analgesic spray: Ethyl chloride. Joint: Left and Right Medial knees Approached in typical fashion with: medial approach  Completed without difficulty Meds: 1 cc 40 mg depo, 3 cc 1 % lidocaine  Needle: 21 1 1/2 gauge  Aftercare instructions and Red flags advised.

## 2013-05-25 NOTE — Progress Notes (Signed)
[name] is a 52 y.o. referred by Dr. Mikey Bussing who presents today for B/L knee pain and B/L medial foot pain.  Pt with known mild L knee medial compartment OA with last X-rays and assessment in 2012.  Pt states she has not had any recent flares of this but did notice around 6 months ago that she developed L knee pain, insidious in onset, no inciting trauma or previous injury that was worse in the medial aspect of her knee.  Described as dull achy pain, worse with movement at that time, that was intermittent.  Over the last several months she has developed R knee discomfort as well as B/L medial foot pain.  She has been following with her PCP who did a magnitude of labs showing a mild elevated uric acid.  Concern for gout vs OA and referred over to [place].  Pt has started mobic 15 mg qd about one week ago, noting minimal to no relief.  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  She wears hard soled work boots with no arch support.  She denies history of DM, but denies any rheumatologic conditions or family history of rheumatologic conditions other than gout in her father/brother.  Denies any systemic symptoms including fever, warm joints, SOB, CP.     Past Medical History  Diagnosis Date  . Hypertension   . Breast mass 02/2008     Noted on mammogram March 11, 2008 3 cm simple cyst at 9 to 10:00 position of the right breast as well as multiple other smaller simple cyst, 2.6 cm mass at the 10:00 position of the right breast also representing complex cyst,  . 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 Fibroid uterus  2001     status post total abdominal hysterectomy, right and left  salpingo-oophorectomy , done by Dr. Clearance Coots  . Anemia 2009     Baseline hemoglobin at 11, secondary to iron deficiency  . Deep vein thrombosis (DVT)      history of multiple  DVTs in 1989, 1989, 2004, on chronic anticoagulation with Coumadin  . PE (pulmonary embolism)      history of multiple PEs in 1984, 1989, 2004-  electronic data reviewed in [place] and EMR and I was not able to find single CT angiogram that was positive for pulmonary embolus nor was I able to find Doppler studies that were positive for DVTs  . Diverticulosis     noted colonoscopy 2010  . Eczema     History   Social History  . Marital Status: Married    Spouse Name: N/A    Number of Children: N/A  . Years of Education: N/A   Occupational History  Environmental education officer  Social History Main Topics  . Smoking status: Never Smoker   . Smokeless tobacco: No  . Alcohol Use: No  . Drug Use: No  . Sexual Activity: Not on file   Family History  Problem Relation Age of Onset  . Stroke Neg Hx   . Cancer    . Clotting disorder    Gout in her Father and Brother  Current Outpatient Prescriptions on File Prior to Visit  Medication  Sig Dispense Refill  . amLODipine (NORVASC) 10 MG tablet Take 1 tablet (10 mg total) by mouth daily.  30 tablet  5  . ferrous sulfate 325 (65 FE) MG tablet Take 325 mg by mouth daily. Take with food.       . hydrocortisone valerate ointment (WESTCORT) 0.2 % Apply topically 2 (two) times daily.  45 g  1  . meloxicam (MOBIC) 7.5 MG tablet Take 1 tablet (7.5 mg total) by mouth daily.  45 tablet  0  . pantoprazole (PROTONIX) 40 MG tablet Take 1 tablet (40 mg total) by mouth daily at 12 noon.  30 tablet  0   No current facility-administered medications on file prior to visit.    Patient Information Form: Screening and ROS   Review of Symptoms 11 point ROS discussed and reviewed with patient.  Pertinent positives include joint pain and high blood pressure.  Otherwise her ROS is  negative   Physical Exam Filed Vitals:   05/25/13 1031  BP: 126/84  Pulse: 67    Gen: NAD, Well nourished, Well developed HEENT: EOMI, [place]/AT Neck: no JVD Cardio: RRR, no murmurs appreciated  Lungs: CTA, no wheezes, rhonchi, crackles Psych: AAO x 3 Neurovascular intact LE B/L   Knee: Normal to inspection with no erythema or effusion or obvious bony abnormalities. Palpation normal with no warmth or patellar tenderness or condyle tenderness. TTP along the medial joint lines B/L.  ROM normal in flexion and extension and lower leg rotation. Ligaments with solid consistent endpoints including ACL, PCL, LCL, MCL. Negative Mcmurray's and provocative meniscal tests. Non painful patellar compression.  Minimal patellar crepitus B/L   Patellar and quadriceps tendons unremarkable. Hamstring and quadriceps strength is normal.  On left foot patient has hallux valgus deformity of great toe and first MT: there is associated loss of longitudinal arch w/o evidence of pes planus: Transverse arch intact, + callous formation at the plantar aspect of the 1st MTP   On right foot patient has hallux valgus deformity of great toe and first MT; Longitudinal arch intact, transverse arch intact, no evidence of pes planus.  No callous formation at the 1st MTP   Uric Acid 05/12/13  - 7.7

## 2013-05-25 NOTE — Patient Instructions (Addendum)
[name], it was nice meeting you today.  Please have the X-rays performed of your foot and your knees.  We will see you back in three weeks, and if you have any concerns, please let us know.  Thanks, Dr. Paulina Fusi and Dr. Margaretha Sheffield   Joint Injection Care After Refer to this sheet in the next few days. These instructions provide you with information on caring for yourself after you have had a joint injection. Your caregiver also may give you more specific instructions. Your treatment has been planned according to current medical practices, but problems sometimes occur. Call your caregiver if you have any problems or questions after your procedure. After any type of joint injection, it is not uncommon to experience:  Soreness, swelling, or bruising around the injection site.  Mild numbness, tingling, or weakness around the injection site caused by the numbing medicine used before or with the injection. It also is possible to experience the following effects associated with the specific agent after injection:  Iodine-based contrast agents:  Allergic reaction (itching, hives, widespread redness, and swelling beyond the injection site).  Corticosteroids (These effects are rare.):  Allergic reaction.  Increased blood sugar levels (If you have diabetes and you notice that your blood sugar levels have increased, notify your caregiver).  Increased blood pressure levels.  Mood swings.  Hyaluronic acid in the use of viscosupplementation.  Temporary heat or redness.  Temporary rash and itching.  Increased fluid accumulation in the injected joint. These effects all should resolve within a day after your procedure.  HOME CARE INSTRUCTIONS  Limit yourself to light activity the day of your procedure. Avoid lifting heavy objects, bending, stooping, or twisting.  Take prescription or over-the-counter pain medication as directed by your caregiver.  You may apply ice to your injection site to reduce  pain and swelling the day of your procedure. Ice may be applied 3-4 times:  Put ice in a plastic bag.  Place a towel between your skin and the bag.  Leave the ice on for no longer than 15-20 minutes each time. SEEK IMMEDIATE MEDICAL CARE IF:   Pain and swelling get worse rather than better or extend beyond the injection site.  Numbness does not go away.  Blood or fluid continues to leak from the injection site.  You have chest pain.  You have swelling of your face or tongue.  You have trouble breathing or you become dizzy.  You develop a fever, chills, or severe tenderness at the injection site that last longer than 1 day. MAKE SURE YOU:  Understand these instructions.  Watch your condition.  Get help right away if you are not doing well or if you get worse. Document Released: 04/19/2011 Document Revised: 10/29/2011 Document Reviewed: 04/19/2011 [facility] Patient Information 2014 [place], [place].

## 2013-05-25 NOTE — Assessment & Plan Note (Addendum)
Pt Sx consistent with most likely 1st MTP joint pain secondary to primary osteoarthritis.  Her joint is not edematous, non erythematous, and does not feel warm to touch.  As well, she has some callous formation at the plantar aspect of her left foot at the 1st MTP joint consistent with loss of her transverse arch.  Consequently, she may benefit from a 1st ray pad to help alleviate some of the pressure placed at the head of her 1st MT.  Will get A/P and lateral films of her left foot to evaluate for possible OA.  She has recently had a uric acid which was mildly elevated to 7.7, but she does not exhibit classic Sx of gout and is able to ambulate up and down the halls w/o evidence of antalgic gait which would be expected if she had true 1st MTP gouty arthritis.  For her pain, will tx with voltaren 75 mg BID as mobic 15 mg qd did not seem to have an effect on her. F/U in 3 weeks to discuss X-rays, plan, and f/u. She is instructed to bring a comfortable pair of shoes to that visit.

## 2013-06-17 ENCOUNTER — Encounter: Payer: Self-pay | Admitting: Sports Medicine

## 2013-06-17 ENCOUNTER — Ambulatory Visit (INDEPENDENT_AMBULATORY_CARE_PROVIDER_SITE_OTHER): Payer: BC Managed Care – PPO | Admitting: Sports Medicine

## 2013-06-17 VITALS — BP 138/83 | Ht 71.0 in | Wt 262.0 lb

## 2013-06-17 DIAGNOSIS — M79671 Pain in right foot: Secondary | ICD-10-CM

## 2013-06-17 DIAGNOSIS — M25569 Pain in unspecified knee: Secondary | ICD-10-CM

## 2013-06-17 DIAGNOSIS — M25561 Pain in right knee: Secondary | ICD-10-CM

## 2013-06-17 DIAGNOSIS — M79609 Pain in unspecified limb: Secondary | ICD-10-CM

## 2013-06-17 NOTE — Progress Notes (Signed)
  Subjective:    Patient ID: [name], female    DOB: Feb 14, 1961, 52 y.o.   MRN: [id number]  HPI Patient comes in today for followup on bilateral foot and bilateral knee pain. Knee pain has improved after recent cortisone injections. Left knee pain has completely resolved. Right knee pain is still present but tolerable. Voltaren does seem to be somewhat helpful. She states that she experiences diffuse foot pain with prolonged standing and walking. 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 Although we have asked her to bring pair of comfortable shoes to this visit, patient forgot to do this. We also ordered x-rays of her knees and feet at her last visit but they have yet to be done.    Review of Systems     Objective:   Physical Exam Well-developed, well-nourished. No acute distress  Examination of each knee shows full range of motion. No effusion. She is tender to palpation along the medial joint line bilaterally. Negative McMurray's bilaterally. Good ligamentous stability.  Examination of her feet shows no obvious soft tissue swelling. No ankle effusion. Diffuse tenderness to palpation. Hallux valgus deformity of each great toe is once again appreciated.       Assessment & Plan:  Improved bilateral knee pain likely secondary to DJD Diffuse foot and ankle pain  AP, lateral, and sunrise views of each knee. AP and lateral foot x-rays. Patient will return to the office early next week with her work shoes. I would like to try to fit her with some green sports insoles with bilateral first ray posts to help provide some extra stability and cushioning to the first ray. We will plan on discussing her x-rays at that time as well.

## 2013-06-23 ENCOUNTER — Ambulatory Visit
Admission: RE | Admit: 2013-06-23 | Discharge: 2013-06-23 | Disposition: A | Discharge status: Home / Self Care | Payer: BC Managed Care – PPO | Source: Ambulatory Visit | Attending: Sports Medicine | Admitting: Sports Medicine

## 2013-06-23 DIAGNOSIS — M79671 Pain in right foot: Secondary | ICD-10-CM

## 2013-06-23 DIAGNOSIS — M25561 Pain in right knee: Secondary | ICD-10-CM

## 2013-06-25 ENCOUNTER — Telehealth: Payer: Self-pay | Admitting: *Deleted

## 2013-06-25 NOTE — Telephone Encounter (Signed)
Pt calls

## 2013-06-26 NOTE — Telephone Encounter (Signed)
I spoke with the patient on the phone today after reviewing x-rays of both her knees and her feet. She has some mild degenerative changes in each knee as well as some mild degenerative changes at each first MTP joint. Mild bunion deformity bilaterally. Patient has yet to return to the office to get her green sports insoles. She will come in to the office early next week at which point we will fit her with green sports insoles and bilateral first ray posts. Followup visit with me should be scheduled for 4 weeks later.

## 2013-06-29 ENCOUNTER — Ambulatory Visit: Payer: BC Managed Care – PPO | Admitting: Internal Medicine

## 2013-06-29 ENCOUNTER — Encounter: Payer: Self-pay | Admitting: Internal Medicine

## 2013-07-19 ENCOUNTER — Encounter (HOSPITAL_COMMUNITY): Payer: Self-pay | Admitting: Emergency Medicine

## 2013-07-19 ENCOUNTER — Emergency Department (HOSPITAL_COMMUNITY): Payer: BC Managed Care – PPO

## 2013-07-19 ENCOUNTER — Emergency Department (HOSPITAL_COMMUNITY)
Admission: EM | Admit: 2013-07-19 | Discharge: 2013-07-19 | Disposition: A | Discharge status: Home / Self Care | Payer: BC Managed Care – PPO | Attending: Emergency Medicine | Admitting: Emergency Medicine

## 2013-07-19 DIAGNOSIS — Z853 Personal history of malignant neoplasm of breast: Secondary | ICD-10-CM | POA: Insufficient documentation

## 2013-07-19 DIAGNOSIS — D649 Anemia, unspecified: Secondary | ICD-10-CM | POA: Insufficient documentation

## 2013-07-19 DIAGNOSIS — Z7982 Long term (current) use of aspirin: Secondary | ICD-10-CM | POA: Insufficient documentation

## 2013-07-19 DIAGNOSIS — Z86718 Personal history of other venous thrombosis and embolism: Secondary | ICD-10-CM | POA: Insufficient documentation

## 2013-07-19 DIAGNOSIS — Z8742 Personal history of other diseases of the female genital tract: Secondary | ICD-10-CM | POA: Insufficient documentation

## 2013-07-19 DIAGNOSIS — Z86711 Personal history of pulmonary embolism: Secondary | ICD-10-CM | POA: Insufficient documentation

## 2013-07-19 DIAGNOSIS — K5900 Constipation, unspecified: Secondary | ICD-10-CM | POA: Insufficient documentation

## 2013-07-19 DIAGNOSIS — E876 Hypokalemia: Secondary | ICD-10-CM | POA: Insufficient documentation

## 2013-07-19 DIAGNOSIS — E669 Obesity, unspecified: Secondary | ICD-10-CM | POA: Insufficient documentation

## 2013-07-19 DIAGNOSIS — Z872 Personal history of diseases of the skin and subcutaneous tissue: Secondary | ICD-10-CM | POA: Insufficient documentation

## 2013-07-19 DIAGNOSIS — R002 Palpitations: Secondary | ICD-10-CM | POA: Insufficient documentation

## 2013-07-19 DIAGNOSIS — Z79899 Other long term (current) drug therapy: Secondary | ICD-10-CM | POA: Insufficient documentation

## 2013-07-19 DIAGNOSIS — I10 Essential (primary) hypertension: Secondary | ICD-10-CM | POA: Insufficient documentation

## 2013-07-19 LAB — COMPREHENSIVE METABOLIC PANEL
ALT: 52 U/L — ABNORMAL HIGH (ref 0–35)
AST: 29 U/L (ref 0–37)
Albumin: 3.8 g/dL (ref 3.5–5.2)
Alkaline Phosphatase: 87 U/L (ref 39–117)
BUN: 15 mg/dL (ref 6–23)
CO2: 26 mEq/L (ref 19–32)
Calcium: 9.6 mg/dL (ref 8.4–10.5)
Chloride: 101 mEq/L (ref 96–112)
Creatinine, Ser: 0.94 mg/dL (ref 0.50–1.10)
GFR calc Af Amer: 78 mL/min — ABNORMAL LOW (ref >90)
GFR calc non Af Amer: 68 mL/min — ABNORMAL LOW (ref >90)
Glucose, Bld: 99 mg/dL (ref 70–99)
Potassium: 3.3 mEq/L — ABNORMAL LOW (ref 3.5–5.1)
Sodium: 138 mEq/L (ref 135–145)
Total Bilirubin: 0.3 mg/dL (ref 0.3–1.2)
Total Protein: 7.4 g/dL (ref 6.0–8.3)

## 2013-07-19 LAB — CBC WITH DIFFERENTIAL/PLATELET
Basophils Absolute: 0 10*3/uL (ref 0.0–0.1)
Basophils Relative: 0 % (ref 0–1)
Eosinophils Absolute: 0.2 10*3/uL (ref 0.0–0.7)
Eosinophils Relative: 3 % (ref 0–5)
HCT: 34.7 % — ABNORMAL LOW (ref 36.0–46.0)
Hemoglobin: 12 g/dL (ref 12.0–15.0)
Lymphocytes Relative: 47 % — ABNORMAL HIGH (ref 12–46)
Lymphs Abs: 2.9 10*3/uL (ref 0.7–4.0)
MCH: 29.6 pg (ref 26.0–34.0)
MCHC: 34.6 g/dL (ref 30.0–36.0)
MCV: 85.7 fL (ref 78.0–100.0)
Monocytes Absolute: 0.5 10*3/uL (ref 0.1–1.0)
Monocytes Relative: 8 % (ref 3–12)
Neutro Abs: 2.5 10*3/uL (ref 1.7–7.7)
Neutrophils Relative %: 42 % — ABNORMAL LOW (ref 43–77)
Platelets: 268 10*3/uL (ref 150–400)
RBC: 4.05 MIL/uL (ref 3.87–5.11)
RDW: 13.7 % (ref 11.5–15.5)
WBC: 6 10*3/uL (ref 4.0–10.5)

## 2013-07-19 LAB — POCT I-STAT TROPONIN I: Troponin i, poc: 0 ng/mL (ref 0.00–0.08)

## 2013-07-19 MED ORDER — ASPIRIN 81 MG PO CHEW
324.0000 mg | CHEWABLE_TABLET | Freq: Once | ORAL | Status: AC
  Administered 2013-07-19: 324 mg via ORAL
  Filled 2013-07-19: qty 4

## 2013-07-19 MED ORDER — POTASSIUM CHLORIDE CRYS ER 20 MEQ PO TBCR
40.0000 meq | EXTENDED_RELEASE_TABLET | Freq: Once | ORAL | Status: AC
  Administered 2013-07-19: 40 meq via ORAL
  Filled 2013-07-19: qty 2

## 2013-07-19 NOTE — ED Notes (Signed)
Pt states that she has been having heart palpitations intermittently for the last two weeks however it began to beat "harder" tonight.  Denies nausea, vomiting.  Endorses epigastric "discomfort" 7/10.

## 2013-07-19 NOTE — ED Provider Notes (Signed)
Medical screening examination/treatment/procedure(s) were performed by non-physician practitioner and as supervising physician I was immediately available for consultation/collaboration.    Keerstin Bjelland, MD 07/19/13 0630 

## 2013-07-19 NOTE — ED Provider Notes (Signed)
CSN: [id number]     Arrival date & time 07/19/13  0305 History   First MD Initiated Contact with Patient 07/19/13 [phone]     Chief Complaint  Patient presents with  . Palpitations   HPI  History provided by the patient's significant other. The patient is a 52 year old female with previous histories of hypertension, DVT, PE and anemia who presents with complaints of heart palpitations. Patient states that she has had intermittent brief episodes of rapid and pounding heartbeat for the past one to 2 weeks. Recently patient traveled to [place] area for Thanksgiving holiday. She returned yesterday evening and after being home for only a short time she began having a much more intense palpitation and pounding of the heart the began around midnight. This seemed to last much longer. On the way to the emergency room she did have some relief of symptoms. She denies any associated chest pain or shortness of breath. She denies having any nausea, vomiting or diarrhea. She does report some pain and discomfort in her bilateral posterior neck. No pain to the jaw. No pain in the upper extremities. No other aggravating or alleviating factors. No other associated symptoms.    Past Medical History  Diagnosis Date  . Hypertension   . Breast mass 02/2008     Noted on mammogram March 11, 2008 3 cm simple cyst at 9 to 10:00 position of the right breast as well as multiple other smaller simple cyst, 2.6 cm mass at the 10:00 position of the right breast also representing complex cyst,  . 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 Fibroid uterus  2001     status post total abdominal hysterectomy, right and left salpingo-oophorectomy , done by Dr. Clearance Coots  . Anemia 2009     Baseline  hemoglobin at 11, secondary to iron deficiency  . Deep vein thrombosis (DVT)      history of multiple  DVTs in 1989, 1989, 2004, on chronic anticoagulation with Coumadin  . PE (pulmonary embolism)      history of multiple PEs in 1984, 1989, 2004-  electronic data reviewed in [place] and EMR and I was not able to find single CT angiogram that was positive for pulmonary embolus nor was I able to find Doppler studies that were positive for DVTs  . Diverticulosis     noted colonoscopy 2010  . Eczema    Past Surgical History  Procedure Laterality Date  . Abdominal hysterectomy  04/13/2010    done by Dr. Clearance Coots  . Mastectomy, partial  03/2006     left partial mastectomy secondary to fibrocystic disease intraluminal fibroadenoma performed March 28, 2006 done by Dr. Zachery Dakins  . Cholecystectomy      1997   Family History  Problem Relation Age of Onset  . Stroke Neg Hx   . Cancer    . Clotting disorder    . Gout Father    History  Substance Use Topics  . Smoking status: Never Smoker   . Smokeless tobacco: Not on file  . Alcohol Use: No   OB History   Grav Para Term Preterm Abortions TAB SAB Ect Mult Living                 Review of Systems  Constitutional: Negative for fever, chills  and diaphoresis.  Respiratory: Negative for shortness of breath.   Cardiovascular: Positive for palpitations. Negative for chest pain and leg swelling.  Gastrointestinal: Positive for constipation. Negative for nausea, vomiting, abdominal pain and diarrhea.  Genitourinary: Negative for dysuria, frequency, hematuria and flank pain.  All other systems reviewed and are negative.    Allergies  Dilaudid  Home Medications   Current Outpatient Rx  Name  Route  Sig  Dispense  Refill  . amLODipine (NORVASC) 10 MG tablet   Oral   Take 1 tablet (10 mg total) by mouth daily.   30 tablet   5     Please D/C Maxzide-25   . aspirin 325 MG tablet   Oral   Take 325 mg by mouth daily.         .  diclofenac (VOLTAREN) 75 MG EC tablet   Oral   Take 1 tablet (75 mg total) by mouth 2 (two) times daily.   60 tablet   0   . ferrous sulfate 325 (65 FE) MG tablet   Oral   Take 325 mg by mouth daily. Take with food.          . hydrocortisone valerate ointment (WESTCORT) 0.2 %   Topical   Apply topically 2 (two) times daily.   45 g   1   . naproxen sodium (ANAPROX) 220 MG tablet   Oral   Take 220 mg by mouth as needed (pain).          BP 137/91  Pulse 84  Temp(Src) 97.5 F (36.4 C) (Oral)  Resp 12  Ht 5\' 11"  (1.803 m)  Wt 257 lb (116.574 kg)  BMI 35.86 kg/m2  SpO2 100% Physical Exam  Nursing note and vitals reviewed. Constitutional: She is oriented to person, place, and time. She appears well-developed and well-nourished. No distress.  HENT:  Head: Normocephalic and atraumatic.  Neck: Normal range of motion. Neck supple. No thyromegaly present.  Cardiovascular: Normal rate, regular rhythm and intact distal pulses.   No murmur heard. Pulmonary/Chest: Effort normal and breath sounds normal. No respiratory distress. She has no wheezes. She has no rales.  Abdominal: Soft. There is no tenderness. There is no rebound and no guarding.  Obese  Musculoskeletal: Normal range of motion. She exhibits no edema and no tenderness.  No pain or swelling of the lower extremities.  Neurological: She is alert and oriented to person, place, and time.  Skin: Skin is warm and dry. No rash noted.  Psychiatric: She has a normal mood and affect. Her behavior is normal.    ED Course  Procedures   DIAGNOSTIC STUDIES: Oxygen Saturation is 100% on room air.  COORDINATION OF CARE:  Nursing notes reviewed. Vital signs reviewed. Initial pt interview and examination performed.   4:51 AM-patient seen and evaluated. The patient appears comfortable no acute distress. Currently denies sensation of palpations. Denies any associated chest pain or shortness of breath. Discussed work up plan with  pt at bedside, which includes lab testing, EKG and chest x-ray. Pt agrees with plan.  The patient continues to be comfortable in the emergency department. She has no irregular heart rate to on a monitor. She did report slight return of symptoms briefly. No chest pain or other changes in symptoms. Lab testing without any concerning findings. Slight hypokalemia. Unremarkable EKG and chest x-ray. Patient discussed with attending physician. At this time feel patient may return home and followup with PCP. She may require Holter monitor possible evaluation of  thyroid.  Findings and treatment plan discussed with patient and significant other who agree and are ready to return home.  Results for orders placed during the hospital encounter of 07/19/13  CBC WITH DIFFERENTIAL      Result Value Range   WBC 6.0  4.0 - 10.5 K/uL   RBC 4.05  3.87 - 5.11 MIL/uL   Hemoglobin 12.0  12.0 - 15.0 g/dL   HCT 57.8 (*) 46.9 - 62.9 %   MCV 85.7  78.0 - 100.0 fL   MCH 29.6  26.0 - 34.0 pg   MCHC 34.6  30.0 - 36.0 g/dL   RDW 52.8  41.3 - 24.4 %   Platelets 268  150 - 400 K/uL   Neutrophils Relative % 42 (*) 43 - 77 %   Neutro Abs 2.5  1.7 - 7.7 K/uL   Lymphocytes Relative 47 (*) 12 - 46 %   Lymphs Abs 2.9  0.7 - 4.0 K/uL   Monocytes Relative 8  3 - 12 %   Monocytes Absolute 0.5  0.1 - 1.0 K/uL   Eosinophils Relative 3  0 - 5 %   Eosinophils Absolute 0.2  0.0 - 0.7 K/uL   Basophils Relative 0  0 - 1 %   Basophils Absolute 0.0  0.0 - 0.1 K/uL  COMPREHENSIVE METABOLIC PANEL      Result Value Range   Sodium 138  135 - 145 mEq/L   Potassium 3.3 (*) 3.5 - 5.1 mEq/L   Chloride 101  96 - 112 mEq/L   CO2 26  19 - 32 mEq/L   Glucose, Bld 99  70 - 99 mg/dL   BUN 15  6 - 23 mg/dL   Creatinine, Ser 0.10  0.50 - 1.10 mg/dL   Calcium 9.6  8.4 - 27.2 mg/dL   Total Protein 7.4  6.0 - 8.3 g/dL   Albumin 3.8  3.5 - 5.2 g/dL   AST 29  0 - 37 U/L   ALT 52 (*) 0 - 35 U/L   Alkaline Phosphatase 87  39 - 117 U/L   Total  Bilirubin 0.3  0.3 - 1.2 mg/dL   GFR calc non Af Amer 68 (*) >90 mL/min   GFR calc Af Amer 78 (*) >90 mL/min  POCT I-STAT TROPONIN I      Result Value Range   Troponin i, poc 0.00  0.00 - 0.08 ng/mL   Comment 3               Imaging Review Dg Chest 2 View  07/19/2013   CLINICAL DATA:  Palpitations  EXAM: CHEST  2 VIEW  COMPARISON:  Prior radiograph from/26/13  FINDINGS: The cardiac and mediastinal silhouettes are stable in size and contour, and remain within normal limits.  Lungs are normally inflated. No focal infiltrate, pulmonary edema, or pleural effusion identified. There is no pneumothorax.  Osseous structures are unchanged.  IMPRESSION: No acute cardiopulmonary process identified.   Electronically Signed   By: Rise Mu M.D.   On: 07/19/2013 04:23    EKG Interpretation    Date/Time:  Sunday July 19 2013 03:14:19 EST Ventricular Rate:  84 PR Interval:  151 QRS Duration: 88 QT Interval:  386 QTC Calculation: 456 R Axis:   55 Text Interpretation:  Sinus rhythm Nonspecific ST abnormality Abnormal ECG Confirmed by OPITZ  MD, BRIAN ([id number]) on 07/19/2013 3:21:54 AM            MDM   1. Heart palpitations  2. Hypokalemia        Angus Seller, PA-C 07/19/13 [phone]

## 2013-09-29 ENCOUNTER — Encounter: Payer: Self-pay | Admitting: Internal Medicine

## 2013-09-29 ENCOUNTER — Ambulatory Visit (INDEPENDENT_AMBULATORY_CARE_PROVIDER_SITE_OTHER): Payer: BC Managed Care – PPO | Admitting: Internal Medicine

## 2013-09-29 VITALS — BP 112/74 | HR 76 | Temp 97.2°F | Ht 71.0 in | Wt 275.3 lb

## 2013-09-29 DIAGNOSIS — I10 Essential (primary) hypertension: Secondary | ICD-10-CM

## 2013-09-29 DIAGNOSIS — L309 Dermatitis, unspecified: Secondary | ICD-10-CM

## 2013-09-29 DIAGNOSIS — L259 Unspecified contact dermatitis, unspecified cause: Secondary | ICD-10-CM

## 2013-09-29 DIAGNOSIS — R519 Headache, unspecified: Secondary | ICD-10-CM | POA: Insufficient documentation

## 2013-09-29 DIAGNOSIS — M25562 Pain in left knee: Secondary | ICD-10-CM

## 2013-09-29 DIAGNOSIS — R51 Headache: Secondary | ICD-10-CM | POA: Insufficient documentation

## 2013-09-29 DIAGNOSIS — M25561 Pain in right knee: Secondary | ICD-10-CM

## 2013-09-29 MED ORDER — TRIAMCINOLONE ACETONIDE 0.025 % EX OINT: 1.0000 "application " | TOPICAL_OINTMENT | Freq: Two times a day (BID) | CUTANEOUS | Status: DC

## 2013-09-29 MED ORDER — AMLODIPINE BESYLATE 10 MG PO TABS: 10.0000 mg | ORAL_TABLET | Freq: Every day | ORAL | Status: DC

## 2013-09-29 MED ORDER — DICLOFENAC SODIUM 75 MG PO TBEC
75.0000 mg | DELAYED_RELEASE_TABLET | Freq: Two times a day (BID) | ORAL | Status: DC
Start: 2013-09-29 — End: 2014-01-27

## 2013-09-29 NOTE — Assessment & Plan Note (Addendum)
Patient's headache is paroxysmal, sharp and located on the left side only. Cluster headache or other type of paroxysmal hemicrania is a consideration, though there are no autonomic symptoms associated with her headaches so this is less likely. Patient's HA may represent atypical trigeminal neuralgia, though these paroxysms are typically shorter in duration (seconds as opposed to minutes). Headaches may also represent primary stabbing HA. Probably the most likely cause of patient's HAs is worsening visual acuity. Patient notes that she has noticed blurry vision over the past few months and that she is due for an appointment with her optometrist. I asked patient to follow up with her optometrist as soon as possible to be evaluated for a new glasses prescription. I believe that this may actually resolve patient's HAs. However, I also asked that patient start taking voltaren 75mg  BID as previously prescribed instead of taking it only PRN for her OA symptoms. This will not only help with her OA, but I believe that this may also help to control her HAs over the short term. I asked patient to return to clinic in 1 month after she has seen her optometrist as well as taken the voltaren for a significant period of time for an adequate trial of this intervention. I asked her to return to clinic earlier than this if she experiences any stroke-like symptoms such as facial droop, focal numbness/tingling/weakness, slurring of speech OR if she has worsening of her HAs, nausea/vomiting. If patient's symptoms do not resolve or improve, I may consider obtaining either a noncontrast head CT or an MRI w/ and w/o contrast (patient notes to me that she is claustrophobic and will need something to relax if we decide to pursue further imaging).

## 2013-09-29 NOTE — Patient Instructions (Signed)
Thank you for your visit.  Please follow up at [facility] in 1 month. If your headaches worsen or if you experience slurring of speech, vision changes, weakness in your arms or legs--please return to clinic sooner than 1 month.  Today I recommended that you follow up with your optometrist for recheck of your eyesight. I think this may be causing or worsening your headaches.   Please take your voltaren twice per day as prescribed for at least 2 weeks to see if this helps control your headaches.  I prescribed triamcinolone ointment for you to use on your left leg to control your eczema. It is important that you keep these areas moist, preferably with thick creams or oil based lubricants such as Vaseline or Aquaphor.   It was good to see you!

## 2013-09-29 NOTE — Assessment & Plan Note (Signed)
At the last minute of the visit patient noted that her westcort was no longer controlling her eczema on her L leg. She would like to try a different topical steroid. I prescribed triamcinolone ointment for her to try. She will apply this twice daily for at least 2 weeks. I also discussed the importance of regular use of emollients such as thick creams and oil based products such as vaseline or aquaphor. She agrees to try these interventions.

## 2013-09-29 NOTE — Progress Notes (Signed)
Patient ID: [name], female   DOB: [birth year], 53 y.o.   MRN: [id number] HPI The patient is a 53 y.o. female with a history of HTN, multiple DVT/PE not on A/C (patient's choice), diverticulosis, and osteoarthritis (b/l knees and b/l first MTP joints) who presents for an acute visit.  Patient c/o having severe, sharp 10/10 L sided HA (top her her eyebrow to her L temporal region) that occurs 1-2 times per week and lasts about 1-52minutes each episode for the past month. Patient notes that the episodes usually begin when she is sitting down and at rest. She has to sit very still during these episodes as movement and turning her head make the pain worse. She has taken aleve and ASA for her HA in the past, though she does not think these medications help. She notes that once the HA has resolved she carefully goes to lie down in bed where she lies still for a few hours. She thinks that lying still prevents the HA from recurring. Patient denies numbness/tingling/weakness in her extremities, facial droop, slurred speech, lacrimation, rhinorrhea, facial erythema or sweating, ear fullness, ptosis, hearing changes, eye redness during these episodes. She does have some chronic eye redness and her eyes seem to water frequently, though this is not new for her. Patient does note that for a few months she has noticed her vision has worsened and that she needs to go to her optometrist to have her vision reevaluated.   ROS: General: no fevers, chills, changes in weight, changes in appetite Skin: no rash HEENT: +HA, blurry vision; no hearing changes, sore throat Pulm: no dyspnea, coughing, wheezing CV: intermittent palpitations; no chest pain, shortness of breath Abd: no abdominal pain, nausea/vomiting, diarrhea/constipation GU: no dysuria, hematuria, polyuria Ext: no arthralgias, myalgias Neuro: no weakness, numbness, or tingling  Filed Vitals:   09/29/13 1031  BP: 112/74  Pulse: 76  Temp: 97.2 F (36.2  C)  SpO2 98% r/a Weight somewhat increased from prior at 275lbs today  Physical Exam General: alert, cooperative, and in no apparent distress HEENT: pupils equal round and reactive to light, vision grossly intact, oropharynx clear and non-erythematous  Neck: supple Lungs: clear to ascultation bilaterally, normal work of respiration Heart: regular rate and rhythm, no murmurs, gallops, or rubs Abdomen: soft, non-tender, non-distended, normal bowel sounds Extremities: warm b/l; no pedal edema; there are multiple patches of hyperpigmentation w/ overlying xerosis to left lower leg Neurologic: alert & oriented X3, cranial nerves II-XII intact, strength 5/5 throughout, sensation intact to light touch, gait normal, cerebellar signs intact  Current Outpatient Prescriptions on File Prior to Visit  Medication Sig Dispense Refill  . amLODipine (NORVASC) 10 MG tablet Take 1 tablet (10 mg total) by mouth daily.  30 tablet  5  . aspirin 325 MG tablet Take 325 mg by mouth daily.      . diclofenac (VOLTAREN) 75 MG EC tablet Take 1 tablet (75 mg total) by mouth 2 (two) times daily.  60 tablet  0  . ferrous sulfate 325 (65 FE) MG tablet Take 325 mg by mouth daily. Take with food.       . hydrocortisone valerate ointment (WESTCORT) 0.2 % Apply topically 2 (two) times daily.  45 g  1  . naproxen sodium (ANAPROX) 220 MG tablet Take 220 mg by mouth as needed (pain).       No current facility-administered medications on file prior to visit.    Assessment/Plan

## 2013-12-30 ENCOUNTER — Ambulatory Visit (HOSPITAL_COMMUNITY)
Admission: RE | Admit: 2013-12-30 | Discharge: 2013-12-30 | Disposition: A | Discharge status: Home / Self Care | Payer: BC Managed Care – PPO | Source: Ambulatory Visit | Attending: Internal Medicine | Admitting: Internal Medicine

## 2013-12-30 ENCOUNTER — Encounter: Payer: Self-pay | Admitting: Internal Medicine

## 2013-12-30 ENCOUNTER — Ambulatory Visit (INDEPENDENT_AMBULATORY_CARE_PROVIDER_SITE_OTHER): Payer: BC Managed Care – PPO | Admitting: Internal Medicine

## 2013-12-30 VITALS — BP 129/86 | HR 75 | Temp 98.2°F | Wt 280.8 lb

## 2013-12-30 DIAGNOSIS — R002 Palpitations: Secondary | ICD-10-CM | POA: Insufficient documentation

## 2013-12-30 DIAGNOSIS — I10 Essential (primary) hypertension: Secondary | ICD-10-CM

## 2013-12-30 DIAGNOSIS — Z86718 Personal history of other venous thrombosis and embolism: Secondary | ICD-10-CM

## 2013-12-30 LAB — CBC WITH DIFFERENTIAL/PLATELET
Basophils Absolute: 0 10*3/uL (ref 0.0–0.1)
Basophils Relative: 0 % (ref 0–1)
Eosinophils Absolute: 0.1 10*3/uL (ref 0.0–0.7)
Eosinophils Relative: 2 % (ref 0–5)
HCT: 35.9 % — ABNORMAL LOW (ref 36.0–46.0)
Hemoglobin: 12.4 g/dL (ref 12.0–15.0)
Lymphocytes Relative: 48 % — ABNORMAL HIGH (ref 12–46)
Lymphs Abs: 2.9 10*3/uL (ref 0.7–4.0)
MCH: 28.7 pg (ref 26.0–34.0)
MCHC: 34.5 g/dL (ref 30.0–36.0)
MCV: 83.1 fL (ref 78.0–100.0)
Monocytes Absolute: 0.5 10*3/uL (ref 0.1–1.0)
Monocytes Relative: 9 % (ref 3–12)
Neutro Abs: 2.5 10*3/uL (ref 1.7–7.7)
Neutrophils Relative %: 41 % — ABNORMAL LOW (ref 43–77)
Platelets: 301 10*3/uL (ref 150–400)
RBC: 4.32 MIL/uL (ref 3.87–5.11)
RDW: 15 % (ref 11.5–15.5)
WBC: 6 10*3/uL (ref 4.0–10.5)

## 2013-12-30 NOTE — Assessment & Plan Note (Signed)
Patient with a history of recurrent PE's and is a candidate for indefinite anti-coagulation. When raised the issue of anti-coagulation, patient reported she had already made a decision and does not want to talk about it.

## 2013-12-30 NOTE — Patient Instructions (Signed)
Follow up with Cardiology as recommended. If your symptoms get worse, or develop shortness of breath, chest pain , seek immediate medical help.

## 2013-12-30 NOTE — Progress Notes (Signed)
Subjective:   Patient ID: [name] female   DOB: November 14, 1960 53 y.o.   MRN: [id number]  HPI: [name] is a 53 y.o. woman with PMH of recurrent PE, DVT (declined to be on indefinite anti-coagulation), HTN comes to the office with CC of palpitations x 6 months.  Patient reports that she has had these palpitations for the last 6-7 months. She states that she would feel her heart beats for a second or two and then it resolves. They are multiple episodes of palpitations each day, each lasting 1-2 seconds, happens at rest, during any activity and at night also. She reports that sometimes she feels dizzy and lightheaded when she has these episodes. She denies any chest pain, headache, shortness of breath, blurred vision, weakness of one side of the body, syncopal attacks. She denies any feeling of being warm, tremors in hands, diarrhea, constipation, abdominal pain, excessive sweating. She reports that her paternal side of the family has a cardiac history and that her dad died of an MI at age 60 and several of her cousins on her father side had MI. She reports that she is worried about these spells and wants to find out what is going on.   She denies any other symptoms.   Past Medical History  Diagnosis Date  . Hypertension   . Breast mass 02/2008     Noted on mammogram March 11, 2008 3 cm simple cyst at 9 to 10:00 position of the right breast as well as multiple other smaller simple cyst, 2.6 cm mass at the 10:00 position of the right breast also representing complex cyst,  . 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 Fibroid uterus  2001     status post total abdominal hysterectomy, right and left salpingo-oophorectomy , done by Dr. [name]  . Anemia  2009     Baseline hemoglobin at 11, secondary to iron deficiency  . Deep vein thrombosis (DVT)      history of multiple  DVTs in 1989, 1989, 2004, on chronic anticoagulation with Coumadin  . PE (pulmonary embolism)      history of multiple PEs in 1984, 1989, 2004-  electronic data reviewed in [place] and EMR and I was not able to find single CT angiogram that was positive for pulmonary embolus nor was I able to find Doppler studies that were positive for DVTs  . Diverticulosis     noted colonoscopy 2010  . Eczema    Current Outpatient Prescriptions  Medication Sig Dispense Refill  . amLODipine (NORVASC) 10 MG tablet Take 1 tablet (10 mg total) by mouth daily.  30 tablet  5  . aspirin 325 MG tablet Take 325 mg by mouth daily.      . diclofenac (VOLTAREN) 75 MG EC tablet Take 1 tablet (75 mg total) by mouth 2 (two) times daily.  60 tablet  0  . ferrous sulfate 325 (65 FE) MG tablet Take 325 mg by mouth daily. Take with food.       . triamcinolone (KENALOG) 0.025 % ointment Apply 1 application topically 2 (two) times daily.  30 g  0   No current facility-administered medications for this visit.   Family History  Problem Relation Age of Onset  . Stroke Neg Hx   .  Cancer    . Clotting disorder    . Gout Father    History   Social History  . Marital Status: Married    Spouse Name: N/A    Number of Children: N/A  . Years of Education: N/A   Social History Main Topics  . Smoking status: Never Smoker   . Smokeless tobacco: None  . Alcohol Use: Yes     Comment: Sips of wine rarely.  . Drug Use: No  . Sexual Activity: None   Other Topics Concern  . None   Social History Narrative  . None   Review of Systems: Pertinent items are noted in HPI. Objective:  Physical Exam: Filed Vitals:   12/30/13 1617 12/30/13 1630 12/30/13 1632 12/30/13 1634  BP: 141/95 135/84 125/90 129/86  Pulse: 76 68 71 75  Temp: 98.2 F (36.8 C)     TempSrc: Oral     Weight: 280 lb 12.8 oz (127.37  kg)     SpO2: 100%     Constitutional: Vital signs reviewed.   Patient is a well-developed and well-nourished and is in no acute distress and cooperative with exam. Alert and oriented x3.  Head: Normocephalic and atraumatic Eyes: PERRL, EOMI, conjunctivae normal, No scleral icterus.  Neck: Supple, Trachea midline normal ROM, No JVD, mass, thyromegaly, or carotid bruit present.  Cardiovascular: RRR, S1 normal, S2 normal, no MRG, pulses symmetric and intact bilaterally Pulmonary/Chest: normal respiratory effort, CTAB, no wheezes, rales, or rhonchi Musculoskeletal: No joint deformities, erythema, or stiffness, ROM full and no nontender Neurological: A&O x3. Skin: Warm, dry and intact.  Psychiatric: Normal mood and affect.   Assessment & Plan:

## 2013-12-30 NOTE — Assessment & Plan Note (Signed)
Slightly elevated. Repeated blood pressures improved.  Plans: Continue current medications.

## 2013-12-30 NOTE — Assessment & Plan Note (Signed)
Palpitations of 6-7 months duration in a 53 year old hypertensive, african [place] woman, with no known structural heart disease or CAD. 12 lead ECG is in NSR with PR interval of 174 ms, QRS duration of 78 ms and QT/QTc of 416/439 ms with no abnormal appearing morphology. Rhythm report appears to have one abnormal appearing beat with a wide QRS and a T wave appearing in the opposite direction from the main QRS vector: signs suggestive of PVC Orthostatic vitals are normal. Discussed with the attending regarding further management.  Plans: Check TSH, Free T4 to rule out thyroid abnormalities. Check CBC to rule out anemia Check CMP, Mg to rule out electrolyte abnormalities. 12 lead ECG Cardiology referral for a possible Holter monitoring and further management.  Recommended patient to seek immediate medical help if her symptoms get worse.

## 2013-12-31 LAB — COMPLETE METABOLIC PANEL WITH GFR
ALT: 37 U/L — ABNORMAL HIGH (ref 0–35)
AST: 21 U/L (ref 0–37)
Albumin: 4.2 g/dL (ref 3.5–5.2)
Alkaline Phosphatase: 74 U/L (ref 39–117)
BUN: 11 mg/dL (ref 6–23)
CO2: 27 mEq/L (ref 19–32)
Calcium: 9.4 mg/dL (ref 8.4–10.5)
Chloride: 102 mEq/L (ref 96–112)
Creat: 0.89 mg/dL (ref 0.50–1.10)
GFR, Est African American: 84 mL/min
GFR, Est Non African American: 73 mL/min
Glucose, Bld: 84 mg/dL (ref 70–99)
Potassium: 4 mEq/L (ref 3.5–5.3)
Sodium: 141 mEq/L (ref 135–145)
Total Bilirubin: 0.3 mg/dL (ref 0.2–1.2)
Total Protein: 7.1 g/dL (ref 6.0–8.3)

## 2013-12-31 LAB — MAGNESIUM: Magnesium: 2 mg/dL (ref 1.5–2.5)

## 2013-12-31 LAB — T4, FREE: Free T4: 1.26 ng/dL (ref 0.80–1.80)

## 2013-12-31 LAB — TSH: TSH: 0.674 u[IU]/mL (ref 0.350–4.500)

## 2013-12-31 NOTE — Progress Notes (Signed)
Case discussed with Dr. Boggala at the time of the visit.  We reviewed the resident's history and exam and pertinent patient test results.  I agree with the assessment, diagnosis, and plan of care documented in the resident's note. 

## 2014-01-01 ENCOUNTER — Telehealth: Payer: Self-pay | Admitting: Internal Medicine

## 2014-01-01 NOTE — Telephone Encounter (Signed)
Called patient and spoke to her. Patient wanted to discuss her blood work which was within normal limits. Patient wanted to see if she could get an earlier appointment than 01/27/14 with cardiology. After speaking to Hilda Blades, apparently that is the earliest we could get. I recommended patient to call the clinic or go to ED if her symptoms get worse. Patient understands the plan.

## 2014-01-06 ENCOUNTER — Emergency Department (HOSPITAL_COMMUNITY): Payer: BC Managed Care – PPO

## 2014-01-06 ENCOUNTER — Encounter (HOSPITAL_COMMUNITY): Payer: Self-pay | Admitting: Emergency Medicine

## 2014-01-06 ENCOUNTER — Emergency Department (HOSPITAL_COMMUNITY)
Admission: EM | Admit: 2014-01-06 | Discharge: 2014-01-06 | Disposition: A | Discharge status: Home / Self Care | Payer: BC Managed Care – PPO | Attending: Emergency Medicine | Admitting: Emergency Medicine

## 2014-01-06 DIAGNOSIS — IMO0002 Reserved for concepts with insufficient information to code with codable children: Secondary | ICD-10-CM | POA: Insufficient documentation

## 2014-01-06 DIAGNOSIS — R0602 Shortness of breath: Secondary | ICD-10-CM | POA: Insufficient documentation

## 2014-01-06 DIAGNOSIS — Z86711 Personal history of pulmonary embolism: Secondary | ICD-10-CM | POA: Insufficient documentation

## 2014-01-06 DIAGNOSIS — R002 Palpitations: Secondary | ICD-10-CM

## 2014-01-06 DIAGNOSIS — Z86718 Personal history of other venous thrombosis and embolism: Secondary | ICD-10-CM | POA: Insufficient documentation

## 2014-01-06 DIAGNOSIS — IMO0001 Reserved for inherently not codable concepts without codable children: Secondary | ICD-10-CM

## 2014-01-06 DIAGNOSIS — K219 Gastro-esophageal reflux disease without esophagitis: Secondary | ICD-10-CM | POA: Insufficient documentation

## 2014-01-06 DIAGNOSIS — Z8742 Personal history of other diseases of the female genital tract: Secondary | ICD-10-CM | POA: Insufficient documentation

## 2014-01-06 DIAGNOSIS — Z872 Personal history of diseases of the skin and subcutaneous tissue: Secondary | ICD-10-CM | POA: Insufficient documentation

## 2014-01-06 DIAGNOSIS — R609 Edema, unspecified: Secondary | ICD-10-CM | POA: Insufficient documentation

## 2014-01-06 DIAGNOSIS — R42 Dizziness and giddiness: Secondary | ICD-10-CM | POA: Insufficient documentation

## 2014-01-06 DIAGNOSIS — Z862 Personal history of diseases of the blood and blood-forming organs and certain disorders involving the immune mechanism: Secondary | ICD-10-CM | POA: Insufficient documentation

## 2014-01-06 DIAGNOSIS — Z7982 Long term (current) use of aspirin: Secondary | ICD-10-CM | POA: Insufficient documentation

## 2014-01-06 DIAGNOSIS — I10 Essential (primary) hypertension: Secondary | ICD-10-CM | POA: Insufficient documentation

## 2014-01-06 DIAGNOSIS — Z791 Long term (current) use of non-steroidal anti-inflammatories (NSAID): Secondary | ICD-10-CM | POA: Insufficient documentation

## 2014-01-06 DIAGNOSIS — Z79899 Other long term (current) drug therapy: Secondary | ICD-10-CM | POA: Insufficient documentation

## 2014-01-06 LAB — I-STAT TROPONIN, ED: Troponin i, poc: 0 ng/mL (ref 0.00–0.08)

## 2014-01-06 LAB — BASIC METABOLIC PANEL
BUN: 14 mg/dL (ref 6–23)
CO2: 25 mEq/L (ref 19–32)
Calcium: 9.9 mg/dL (ref 8.4–10.5)
Chloride: 101 mEq/L (ref 96–112)
Creatinine, Ser: 0.87 mg/dL (ref 0.50–1.10)
GFR calc Af Amer: 85 mL/min — ABNORMAL LOW (ref >90)
GFR calc non Af Amer: 74 mL/min — ABNORMAL LOW (ref >90)
Glucose, Bld: 118 mg/dL — ABNORMAL HIGH (ref 70–99)
Potassium: 3.7 mEq/L (ref 3.7–5.3)
Sodium: 141 mEq/L (ref 137–147)

## 2014-01-06 LAB — CBC
HCT: 37.6 % (ref 36.0–46.0)
Hemoglobin: 12.7 g/dL (ref 12.0–15.0)
MCH: 28.6 pg (ref 26.0–34.0)
MCHC: 33.8 g/dL (ref 30.0–36.0)
MCV: 84.7 fL (ref 78.0–100.0)
Platelets: 314 10*3/uL (ref 150–400)
RBC: 4.44 MIL/uL (ref 3.87–5.11)
RDW: 13.3 % (ref 11.5–15.5)
WBC: 6.3 10*3/uL (ref 4.0–10.5)

## 2014-01-06 LAB — PRO B NATRIURETIC PEPTIDE: Pro B Natriuretic peptide (BNP): 22.7 pg/mL (ref 0–125)

## 2014-01-06 MED ORDER — FAMOTIDINE IN NACL 20-0.9 MG/50ML-% IV SOLN
20.0000 mg | Freq: Once | INTRAVENOUS | Status: AC
  Administered 2014-01-06: 20 mg via INTRAVENOUS
  Filled 2014-01-06: qty 50

## 2014-01-06 MED ORDER — ONDANSETRON HCL 4 MG/2ML IJ SOLN
4.0000 mg | Freq: Once | INTRAMUSCULAR | Status: AC
  Administered 2014-01-06: 4 mg via INTRAVENOUS
  Filled 2014-01-06: qty 2

## 2014-01-06 MED ORDER — GI COCKTAIL ~~LOC~~
30.0000 mL | Freq: Once | ORAL | Status: AC
  Administered 2014-01-06: 30 mL via ORAL
  Filled 2014-01-06: qty 30

## 2014-01-06 MED ORDER — FAMOTIDINE 20 MG PO TABS: 20.0000 mg | ORAL_TABLET | Freq: Two times a day (BID) | ORAL | Status: DC | PRN

## 2014-01-06 MED ORDER — SODIUM CHLORIDE 0.9 % IV BOLUS (SEPSIS)
1000.0000 mL | Freq: Once | INTRAVENOUS | Status: AC
  Administered 2014-01-06: 1000 mL via INTRAVENOUS

## 2014-01-06 MED ORDER — ESOMEPRAZOLE MAGNESIUM 40 MG PO CPDR: 40.0000 mg | DELAYED_RELEASE_CAPSULE | Freq: Every day | ORAL | Status: DC

## 2014-01-06 MED ORDER — IOHEXOL 350 MG/ML SOLN
100.0000 mL | Freq: Once | INTRAVENOUS | Status: AC | PRN
  Administered 2014-01-06: 100 mL via INTRAVENOUS

## 2014-01-06 NOTE — Discharge Instructions (Signed)
Take nexium as prescribed.   Take pepcid as needed.   Follow up with your doctor and your cardiologist.   Return to ER if you have worse palpitations, shortness of breath, chest pain.

## 2014-01-06 NOTE — ED Provider Notes (Signed)
CSN: [id number]     Arrival date & time 01/06/14  1655 History   First MD Initiated Contact with Patient 01/06/14 1711     Chief Complaint  Patient presents with  . Palpitations     (Consider location/radiation/quality/duration/timing/severity/associated sxs/prior Treatment) The history is provided by the patient.  [name] is a 52 y.o. female hx of HTN, multiple DVT and PEs off anticoagulation, here with SOB, palpitations, metallic taste in her mouth. Patient states she has been having palpitations over several months. She also has intermittent lightheadedness. She was seen in internal medicine clinic several days ago had normal CBC, CMP, TSH. She was referred to cardiology for further workup but didn't see them yet. For the last 2-3 days she's been having some metallic takes in her mouth and nausea. She also has some shortness of breath with exertion for several days. Denies leg swelling. She had multiple PEs but did not want to take Coumadin and has been taking for years.    Past Medical History  Diagnosis Date  . Hypertension   . Breast mass 02/2008     Noted on mammogram March 11, 2008 3 cm simple cyst at 9 to 10:00 position of the right breast as well as multiple other smaller simple cyst, 2.6 cm mass at the 10:00 position of the right breast also representing complex cyst,  . 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 Fibroid uterus  2001     status post total abdominal hysterectomy, right and left salpingo-oophorectomy , done by Dr. [name]  . Anemia 2009     Baseline hemoglobin at 11, secondary to iron deficiency  . Deep vein thrombosis (DVT)      history of multiple  DVTs in 1989, 1989, 2004, on chronic anticoagulation with Coumadin  . PE  (pulmonary embolism)      history of multiple PEs in 1984, 1989, 2004-  electronic data reviewed in [place] and EMR and I was not able to find single CT angiogram that was positive for pulmonary embolus nor was I able to find Doppler studies that were positive for DVTs  . Diverticulosis     noted colonoscopy 2010  . Eczema    Past Surgical History  Procedure Laterality Date  . Abdominal hysterectomy  04/13/2010    done by Dr. [name]  . Mastectomy, partial  03/2006     left partial mastectomy secondary to fibrocystic disease intraluminal fibroadenoma performed March 28, 2006 done by Dr. [name]  . Cholecystectomy      1997   Family History  Problem Relation Age of Onset  . Stroke Neg Hx   . Cancer    . Clotting disorder    . Gout Father    History  Substance Use Topics  . Smoking status: Never Smoker   . Smokeless tobacco: Not on file  . Alcohol Use: Yes     Comment: Sips of wine rarely.   OB History   Grav Para Term Preterm Abortions TAB SAB Ect Mult Living                 Review of Systems  Respiratory: Positive for shortness of breath.   Cardiovascular: Positive for palpitations.  All other systems reviewed and are negative.     Allergies  Dilaudid  Home Medications   Prior to Admission medications   Medication Sig Start Date End Date Taking? Authorizing Provider  amLODipine (NORVASC) 10 MG tablet Take 1 tablet (10 mg total) by mouth daily. 09/29/13 09/29/14  Rebecca Eaton, MD  aspirin 325 MG tablet Take 325 mg by mouth daily.    Historical Provider, MD  diclofenac (VOLTAREN) 75 MG EC tablet Take 1 tablet (75 mg total) by mouth 2 (two) times daily. 09/29/13   Rebecca Eaton, MD  ferrous sulfate 325 (65 FE) MG tablet Take 325 mg by mouth daily. Take with food.     Historical Provider, MD  triamcinolone (KENALOG) 0.025 % ointment Apply 1 application topically 2 (two) times daily. 09/29/13   Rebecca Eaton, MD   BP 151/86  Pulse 72  Temp(Src) 97.9 F (36.6  C) (Oral)  Resp 20  SpO2 100% Physical Exam  Nursing note and vitals reviewed. Constitutional: She is oriented to person, place, and time. She appears well-developed and well-nourished.  HENT:  Head: Normocephalic.  Mouth/Throat: Oropharynx is clear and moist.  Eyes: Conjunctivae are normal. Pupils are equal, round, and reactive to light.  Neck: Normal range of motion. Neck supple.  Cardiovascular: Normal rate, regular rhythm and normal heart sounds.   Pulmonary/Chest: Effort normal and breath sounds normal. No respiratory distress. She has no wheezes. She has no rales.  Abdominal: Soft. Bowel sounds are normal. She exhibits no distension. There is no tenderness. There is no rebound and no guarding.  Musculoskeletal: Normal range of motion.  1+ edema bilaterally, no calf tenderness   Neurological: She is alert and oriented to person, place, and time. No cranial nerve deficit. Coordination normal.  Skin: Skin is warm and dry.  Psychiatric: She has a normal mood and affect. Her behavior is normal. Judgment and thought content normal.    ED Course  Procedures (including critical care time) Labs Review Labs Reviewed  BASIC METABOLIC PANEL - Abnormal; Notable for the following:    Glucose, Bld 118 (*)    GFR calc non Af Amer 74 (*)    GFR calc Af Amer 85 (*)    All other components within normal limits  CBC  PRO B NATRIURETIC PEPTIDE  I-STAT TROPOININ, ED    Imaging Review Ct Angio Chest Pe W/cm &/or Wo Cm  01/06/2014   CLINICAL DATA:  Shortness of breath, chest pain  EXAM: CT ANGIOGRAPHY CHEST WITH CONTRAST  TECHNIQUE: Multidetector CT imaging of the chest was performed using the standard protocol during bolus administration of intravenous contrast. Multiplanar CT image reconstructions and MIPs were obtained to evaluate the vascular anatomy.  CONTRAST:  177mL OMNIPAQUE IOHEXOL 350 MG/ML SOLN  COMPARISON:  Chest x-ray July 19, 2013, chest CT April 02, 2011  FINDINGS: There is no  pulmonary embolus. There is no mediastinal or hilar lymphadenopathy. The heart size is normal. There is no pericardial effusion. There is no focal pneumonia, pleural effusion, or pulmonary mass. There is mild dependent atelectasis of the posterior lung bases. There is a right breast mass stable compared to prior CT. The visualized upper abdominal structures are normal. Degenerative joint changes of the spine are noted.  Review of the MIP images confirms the above findings.  IMPRESSION: No pulmonary embolus.  No acute abnormality identified in the chest.   Electronically Signed   By: Abelardo Diesel M.D.   On: 01/06/2014 18:57     EKG Interpretation   Date/Time:  Wednesday Jan 06 2014 16:59:37 EDT Ventricular Rate:  83 PR  Interval:  152 QRS Duration: 81 QT Interval:  357 QTC Calculation: 419 R Axis:   61 Text Interpretation:  Sinus rhythm Low voltage, precordial leads Confirmed  by WARD,  DO, KRISTEN ([id number]) on 01/06/2014 5:15:43 PM      MDM   Final diagnoses:  None   [name] is a 53 y.o. female here with weakness, SOB on exertion, palpitations. Given history of PE and she is not taking coumadin, will need to do CT angio. Also consider ACS vs new onset CHF. Will get labs, trop, BNP.   8:30 PM Trop neg and symptoms for several days. BNP nl. CT showed no PE. Likely reflux vs anxiety. She has cardiology f/u. Will start on nexium, pepcid prn. Stable for d/c.      [name], MD 01/06/14 2030

## 2014-01-06 NOTE — ED Notes (Signed)
Pt c/o intermittent palpitations, sob on exertion, weakness, lightheadedness, and metallic taste in mouth.  Sx started 2 wks ago.  Was seen by doctor for this previously.

## 2014-01-27 ENCOUNTER — Encounter: Payer: Self-pay | Admitting: Cardiology

## 2014-01-27 ENCOUNTER — Ambulatory Visit (INDEPENDENT_AMBULATORY_CARE_PROVIDER_SITE_OTHER): Payer: BC Managed Care – PPO | Admitting: Cardiology

## 2014-01-27 VITALS — BP 126/82 | HR 72 | Ht 71.0 in | Wt 273.0 lb

## 2014-01-27 DIAGNOSIS — R0600 Dyspnea, unspecified: Secondary | ICD-10-CM

## 2014-01-27 DIAGNOSIS — R0989 Other specified symptoms and signs involving the circulatory and respiratory systems: Secondary | ICD-10-CM

## 2014-01-27 DIAGNOSIS — R0609 Other forms of dyspnea: Secondary | ICD-10-CM

## 2014-01-27 DIAGNOSIS — R0602 Shortness of breath: Secondary | ICD-10-CM

## 2014-01-27 DIAGNOSIS — E785 Hyperlipidemia, unspecified: Secondary | ICD-10-CM | POA: Insufficient documentation

## 2014-01-27 DIAGNOSIS — R002 Palpitations: Secondary | ICD-10-CM

## 2014-01-27 NOTE — Patient Instructions (Signed)
Your physician recommends that you continue on your current medications as directed. Please refer to the Current Medication list given to you today.  Your physician has requested that you have en exercise stress myoview. For further information please visit [email or web address]. Please follow instruction sheet, as given.  Your physician has recommended that you wear an event monitor. Event monitors are medical devices that record the heart's electrical activity. Doctors most often [place] these monitors to diagnose arrhythmias. Arrhythmias are problems with the speed or rhythm of the heartbeat. The monitor is a small, portable device. You can wear one while you do your normal daily activities. This is usually used to diagnose what is causing palpitations/syncope (passing out). YOUR MD HAS INSTRUCTED YOU TO BRING THIS BACK AFTER YOU EXPERIENCE AT LEAST 2 EPISODES OF PALPITATIONS  Your physician recommends that you schedule a follow-up appointment in: [place]

## 2014-01-27 NOTE — Progress Notes (Signed)
Patient ID: [name], female   DOB: 10/03/1960, 53 y.o.   MRN: [id number]    Patient Name: [name] Date of Encounter: 01/27/2014  Primary Care Provider:  Rebecca Eaton, MD Primary Cardiologist:  Dorothy Spark  Problem List   Past Medical History  Diagnosis Date  . Hypertension   . Breast mass 02/2008     Noted on mammogram March 11, 2008 3 cm simple cyst at 9 to 10:00 position of the right breast as well as multiple other smaller simple cyst, 2.6 cm mass at the 10:00 position of the right breast also representing complex cyst,  . 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 Fibroid uterus  2001     status post total abdominal hysterectomy, right and left salpingo-oophorectomy , done by Dr. [name]  . Anemia 2009     Baseline hemoglobin at 11, secondary to iron deficiency  . Deep vein thrombosis (DVT)      history of multiple  DVTs in 1989, 1989, 2004, on chronic anticoagulation with Coumadin  . PE (pulmonary embolism)      history of multiple PEs in 1984, 1989, 2004-  electronic data reviewed in [place] and EMR and I was not able to find single CT angiogram that was positive for pulmonary embolus nor was I able to find Doppler studies that were positive for DVTs  . Diverticulosis     noted colonoscopy 2010  . Eczema    Past Surgical History  Procedure Laterality Date  . Abdominal hysterectomy  04/13/2010    done by Dr. [name]  . Mastectomy, partial  03/2006     left partial mastectomy secondary to fibrocystic disease intraluminal fibroadenoma performed March 28, 2006 done by Dr. [name]  . Cholecystectomy      1997    Allergies  Allergies  Allergen Reactions  . Dilaudid [Hydromorphone Hcl] Nausea Only    HPI  A  pleasant 53 year old female with prior medical history of hypertension and obesity and significant family history of premature coronary artery disease who is coming for evaluation of palpitations, progressively worsening dyspnea on exertion and fatigue. The palpitations started earlier this year and have been obscuring irregularly sometimes happening almost every day and sometimes not happening for a few days. The patient states that the start slowly and progressively get worse they can last seconds to minutes and on 2 occasions they were associated with chest pressure and shortness of breath. She also noticed to have worsening dyspnea on exertion and fatigue while walking at work. She is especially concerned since on her father's side had multiple family members with myocardial infarctions. Her father had his first myocardial infarction at age 39 and she has multiple uncles and cousins with early myocardial infarctions. She is not very physically active and is trying to improve her diet. She denies orthopnea, PND, lower strandy edema or prior syncope.  Home Medications  Prior to Admission medications   Medication Sig Start Date End Date Taking? Authorizing Provider  amLODipine (NORVASC) 10 MG tablet Take 10 mg by mouth daily.   Yes Historical Provider, MD  aspirin 325 MG tablet Take 325 mg by mouth daily.   Yes Historical Provider, MD  famotidine (PEPCID) 20 MG tablet Take 1 tablet (20 mg total) by mouth 2 (two)  times daily as needed for heartburn or indigestion. 01/06/14  Yes [name], MD  ferrous sulfate 325 (65 FE) MG tablet Take 325 mg by mouth daily. Take with food.    Yes Historical Provider, MD    Family History  Family History  Problem Relation Age of Onset  . Stroke Neg Hx   . Cancer    . Clotting disorder    . Gout Father     Social History  History   Social History  . Marital Status: Married    Spouse Name: N/A    Number of Children: N/A  . Years of Education: N/A    Occupational History  . Not on file.   Social History Main Topics  . Smoking status: Never Smoker   . Smokeless tobacco: Not on file  . Alcohol Use: Yes     Comment: Sips of wine rarely.  . Drug Use: No  . Sexual Activity: Not on file   Other Topics Concern  . Not on file   Social History Narrative  . No narrative on file     Review of Systems, as per HPI, otherwise negative General:  No chills, fever, night sweats or weight changes.  Cardiovascular:  No chest pain, dyspnea on exertion, edema, orthopnea, palpitations, paroxysmal nocturnal dyspnea. Dermatological: No rash, lesions/masses Respiratory: No cough, dyspnea Urologic: No hematuria, dysuria Abdominal:   No nausea, vomiting, diarrhea, bright red blood per rectum, melena, or hematemesis Neurologic:  No visual changes, wkns, changes in mental status. All other systems reviewed and are otherwise negative except as noted above.  Physical Exam  Blood pressure 126/82, pulse 72, height 5\' 11"  (1.803 m), weight 273 lb (123.832 kg).  General: Pleasant, NAD Psych: Normal affect. Neuro: Alert and oriented X 3. Moves all extremities spontaneously. HEENT: Normal  Neck: Supple without bruits or JVD. Lungs:  Resp regular and unlabored, CTA. Heart: RRR no s3, s4, or murmurs. Abdomen: Soft, non-tender, non-distended, BS + x 4.  Extremities: No clubbing, cyanosis or edema. DP/PT/Radials 2+ and equal bilaterally.  Labs:  No results found for this basename: CKTOTAL, CKMB, TROPONINI,  in the last 72 hours Lab Results  Component Value Date   WBC 6.3 01/06/2014   HGB 12.7 01/06/2014   HCT 37.6 01/06/2014   MCV 84.7 01/06/2014   PLT 314 01/06/2014    Lab Results  Component Value Date   DDIMER  Value: 0.22        AT THE INHOUSE ESTABLISHED CUTOFF VALUE OF 0.48 ug/mL FEU, THIS ASSAY HAS BEEN DOCUMENTED IN THE LITERATURE TO HAVE A SENSITIVITY AND NEGATIVE PREDICTIVE VALUE OF AT LEAST 98 TO 99%.  THE TEST RESULT SHOULD BE CORRELATED  WITH AN ASSESSMENT OF THE CLINICAL PROBABILITY OF DVT / VTE. 09/30/2010   No components found with this basename: POCBNP,     Component Value Date/Time   NA 141 01/06/2014 1725   K 3.7 01/06/2014 1725   CL 101 01/06/2014 1725   CO2 25 01/06/2014 1725   GLUCOSE 118* 01/06/2014 1725   BUN 14 01/06/2014 1725   CREATININE 0.87 01/06/2014 1725   CREATININE 0.89 12/30/2013 1715   CALCIUM 9.9 01/06/2014 1725   PROT 7.1 12/30/2013 1715   ALBUMIN 4.2 12/30/2013 1715   AST 21 12/30/2013 1715   ALT 37* 12/30/2013 1715   ALKPHOS 74 12/30/2013 1715   BILITOT 0.3 12/30/2013 1715   GFRNONAA 74* 01/06/2014 1725   GFRNONAA 73 12/30/2013 1715   GFRAA 85* 01/06/2014 1725  GFRAA 84 12/30/2013 1715   Lab Results  Component Value Date   CHOL 198 05/12/2013   HDL 48 05/12/2013   LDLCALC 126* 05/12/2013   TRIG 120 05/12/2013    Accessory Clinical Findings  Echocardiogram - 2009 Overall left ventricular systolic function was normal. Left ventricular ejection fraction was estimated to be 65 %. There were no left ventricular regional wall motion abnormalities. Left ventricular wall thickness was at the upper limits of normal. - Normal aortic valve - Normal mitral valve  ECG - SR, normal ECG   Assessment & Plan  53 year old female  1. worsening dyspnea on exertion and fatigue - risk factors include physical inactivity, obesity, hypertension and premature CAD in multiple family members. We will proceed with exercise nuclear stress test to rule out ischemia.  2. Palpitations - we will start e-cardio monitor for 30 days, patient is instructed to bring the monitor back once she has at least 2 episodes of palpitations. For now we won't start any medications. Her labs are normal including TSH free T4 electrolytes kidney and liver function.  3. Hypertension - well controlled  4. Hyperlipidemia - LDL 126 with goal less than 100. She is trying to improve her diet we will repeat her cholesterol in couple months if still  elevated will consider therapy.  Followup in 6 weeks     Dorothy Spark, MD, [facility] 01/27/2014, 7:49 AM

## 2014-01-28 ENCOUNTER — Telehealth (HOSPITAL_COMMUNITY): Payer: Self-pay

## 2014-02-02 ENCOUNTER — Encounter (HOSPITAL_COMMUNITY): Payer: BC Managed Care – PPO

## 2014-02-05 ENCOUNTER — Encounter: Payer: Self-pay | Admitting: Cardiology

## 2014-02-05 ENCOUNTER — Encounter: Payer: Self-pay | Admitting: *Deleted

## 2014-02-05 ENCOUNTER — Encounter (INDEPENDENT_AMBULATORY_CARE_PROVIDER_SITE_OTHER): Payer: Self-pay

## 2014-02-05 DIAGNOSIS — R0602 Shortness of breath: Secondary | ICD-10-CM

## 2014-02-05 DIAGNOSIS — R002 Palpitations: Secondary | ICD-10-CM

## 2014-02-05 NOTE — Progress Notes (Signed)
Patient ID: [name], female   DOB: [birth year], 53 y.o.   MRN: [id number] [employer] 30 day cardiac event monitor applied to patient.

## 2014-02-10 ENCOUNTER — Encounter (HOSPITAL_COMMUNITY): Payer: BC Managed Care – PPO

## 2014-02-10 ENCOUNTER — Ambulatory Visit (HOSPITAL_COMMUNITY): Payer: BC Managed Care – PPO | Attending: Cardiology | Admitting: Radiology

## 2014-02-10 DIAGNOSIS — R0600 Dyspnea, unspecified: Secondary | ICD-10-CM

## 2014-02-10 DIAGNOSIS — R0609 Other forms of dyspnea: Principal | ICD-10-CM

## 2014-02-10 DIAGNOSIS — R0989 Other specified symptoms and signs involving the circulatory and respiratory systems: Secondary | ICD-10-CM

## 2014-02-10 MED ORDER — TECHNETIUM TC 99M SESTAMIBI GENERIC - CARDIOLITE
33.0000 | Freq: Once | INTRAVENOUS | Status: AC | PRN
  Administered 2014-02-10: 33 via INTRAVENOUS

## 2014-02-17 NOTE — Telephone Encounter (Signed)
Encounter complete. 

## 2014-02-18 ENCOUNTER — Ambulatory Visit (HOSPITAL_COMMUNITY): Payer: Self-pay | Attending: Cardiology | Admitting: Radiology

## 2014-02-18 VITALS — BP 107/72 | Ht 71.0 in | Wt 272.0 lb

## 2014-02-18 DIAGNOSIS — R0609 Other forms of dyspnea: Secondary | ICD-10-CM | POA: Insufficient documentation

## 2014-02-18 DIAGNOSIS — R0989 Other specified symptoms and signs involving the circulatory and respiratory systems: Secondary | ICD-10-CM | POA: Insufficient documentation

## 2014-02-18 DIAGNOSIS — R002 Palpitations: Secondary | ICD-10-CM | POA: Insufficient documentation

## 2014-02-18 DIAGNOSIS — Z87891 Personal history of nicotine dependence: Secondary | ICD-10-CM | POA: Insufficient documentation

## 2014-02-18 DIAGNOSIS — I10 Essential (primary) hypertension: Secondary | ICD-10-CM | POA: Insufficient documentation

## 2014-02-18 DIAGNOSIS — R0602 Shortness of breath: Secondary | ICD-10-CM | POA: Insufficient documentation

## 2014-02-18 MED ORDER — TECHNETIUM TC 99M SESTAMIBI GENERIC - CARDIOLITE
30.0000 | Freq: Once | INTRAVENOUS | Status: AC | PRN
  Administered 2014-02-18: 30 via INTRAVENOUS

## 2014-02-18 NOTE — Progress Notes (Signed)
[facility] [address] [place], [place] [address] [phone]    Cardiology Nuclear Med Study  [name] is a 53 y.o. female     MRN : [id number]     DOB: [birth year]  Procedure Date: 02/18/2014  Nuclear Med Background Indication for Stress Test:  Evaluation for Ischemia  History:  '09 ECHO: EF: 65% '09 MPI: NL 61% Cardiac Risk Factors: History of Smoking, Hypertension and Lipids  Symptoms:  DOE, Palpitations and SOB   Nuclear Pre-Procedure Caffeine/Decaff Intake:  None NPO After: 6 pm   Lungs:  clear O2 Sat: 98% on room air. IV 0.9% NS with Angio Cath:  22g  IV Site: R Antecubital  IV Started by:  Perrin Maltese, EMT-P  Chest Size (in):  42 Cup Size: D  Height: 5\' 11"  (1.803 m)  Weight:  272 lb (123.378 kg)  BMI:  Body mass index is 37.95 kg/(m^2). Tech Comments:  No Rx this am    Nuclear Med Study 1 or 2 day study: 2 day  Stress Test Type:  Stress  Reading MD: n/a  Order Authorizing Provider:  K.Nelson MD  Resting Radionuclide: Technetium 16m Sestamibi  Resting Radionuclide Dose: 33.0 mCi  02/10/14  Stress Radionuclide:  Technetium 59m Sestamibi  Stress Radionuclide Dose: 33.0 mCi   02/18/14           Stress Protocol Rest HR: 65 Stress HR: 144  Rest BP: 107/72 Stress BP: 193/84  Exercise Time (min): 6:30 METS: 7.30   Predicted Max HR: 165 bpm % Max HR: 87.27 bpm Rate Pressure Product: 27792   Dose of Adenosine (mg):  n/a Dose of Lexiscan: n/a mg  Dose of Atropine (mg): n/a Dose of Dobutamine: n/a mcg/kg/min (at max HR)  Stress Test Technologist: Perrin Maltese, EMT-P  Nuclear Technologist:  Charlton Amor, CNMT     Rest Procedure:  Myocardial perfusion imaging was performed at rest 45 minutes following the intravenous administration of Technetium 33m Sestamibi. Rest ECG: NSR - Normal EKG  Stress Procedure:  The patient exercised on the treadmill utilizing the Bruce Protocol for 6:30 minutes. The patient stopped due  to sob and denied any chest pain.  Technetium 39m Sestamibi was injected at peak exercise and myocardial perfusion imaging was performed after a brief delay. Stress ECG: No significant change from baseline ECG  QPS Raw Data Images:  Mild breast attenuation.  Normal left ventricular size. Stress Images:  There is subtle decreased uptake along the mid to distal anterior wall distribution seen at the rest and stress consistent with breast attenuation. Rest Images:  As above, otherwise homogeneous radiotracer uptake Subtraction (SDS):  No evidence of ischemia. Transient Ischemic Dilatation (Normal <1.22):  0.96 Lung/Heart Ratio (Normal <0.45):  0.24  Quantitative Gated Spect Images QGS EDV:  101 ml QGS ESV:  44 ml  Impression Exercise Capacity:  Fair exercise capacity. BP Response:  Normal blood pressure response. Clinical Symptoms:  Shortness of breath ECG Impression:  No significant ST segment change suggestive of ischemia. Comparison with Prior Nuclear Study: No images to compare  Overall Impression:  Low risk stress nuclear study with no area of ischemia identified.  LV Ejection Fraction: 56%.  LV Wall Motion:  NL LV Function; NL Wall Motion  Candee Furbish, MD

## 2014-02-23 ENCOUNTER — Telehealth: Payer: Self-pay | Admitting: *Deleted

## 2014-02-23 NOTE — Telephone Encounter (Signed)
Contacted pt to inform her that she had a normal stress test, no ischemia, per Dr Meda Coffee.  Pt verbalized understanding and pleased with this news.

## 2014-03-17 ENCOUNTER — Telehealth: Payer: Self-pay

## 2014-03-17 NOTE — Telephone Encounter (Signed)
called to give pt monitor results and Dr.Nelson's instructions.pt event monitor showed she has an episode of nonsustained ventricular tachycardia. Therefore we will start her on a low dose of Lopressor 25 mg twice a day. and sch f/u for 2 mo. With Dr.Nelson.lmtcb

## 2014-03-30 ENCOUNTER — Ambulatory Visit: Payer: BC Managed Care – PPO | Admitting: Cardiology

## 2014-04-20 ENCOUNTER — Ambulatory Visit: Payer: Self-pay | Admitting: Cardiology

## 2014-04-23 ENCOUNTER — Telehealth: Payer: Self-pay

## 2014-04-23 NOTE — Telephone Encounter (Signed)
3rd attempt.called to give pt monitor results and Dr.Nelson's recommendations.

## 2014-04-27 NOTE — Telephone Encounter (Signed)
lmtcb

## 2014-04-29 ENCOUNTER — Encounter: Payer: Self-pay | Admitting: *Deleted

## 2014-04-29 NOTE — Telephone Encounter (Signed)
Result letter to follow-up sent to pts current place of Residence.  Pt cancelled 2 month f/u with Dr Meda Coffee on 04/20/14.

## 2014-06-22 ENCOUNTER — Telehealth: Payer: Self-pay

## 2014-06-22 MED ORDER — METOPROLOL TARTRATE 25 MG PO TABS: 25.0000 mg | ORAL_TABLET | Freq: Two times a day (BID) | ORAL | Status: DC

## 2014-06-22 NOTE — Telephone Encounter (Signed)
Pt calling in response to call that was placed to pt from 3 months ago, in regards to the pts 30 day event monitor results.  Informed the pt that based on her 30 day event monitor from back in July, per Dr.Nelson it showed that she had an episode of nonsustained ventricular tachycardia, and recommended the pt start on a low dose of Lopressor 25 mg twice a day,  and sch a f/u for 2 mo.  Pt states she never called [place] back or responded to follow-up letter that was sent because she was out of town.  Noted on pts file that she cancelled her 2 month f/u with Dr Meda Coffee, on 04/20/14.  Confirmed pharmacy of choice with the pt.  Informed the pt that someone from [facility] will be contacting her to set up a f/u appt.  Pt verbalized understanding and agrees with this plan.

## 2014-06-22 NOTE — Telephone Encounter (Signed)
New message     Want monitor results 

## 2014-08-18 ENCOUNTER — Other Ambulatory Visit: Payer: Self-pay | Admitting: *Deleted

## 2014-08-23 MED ORDER — AMLODIPINE BESYLATE 10 MG PO TABS: 10.0000 mg | ORAL_TABLET | Freq: Every day | ORAL | Status: DC

## 2014-08-23 NOTE — Telephone Encounter (Signed)
Needs appt next 4 months PCP - routine F/U.

## 2014-08-30 ENCOUNTER — Ambulatory Visit: Payer: Self-pay | Admitting: Cardiology

## 2014-09-21 ENCOUNTER — Emergency Department (HOSPITAL_COMMUNITY)
Admission: EM | Admit: 2014-09-21 | Discharge: 2014-09-22 | Disposition: A | Discharge status: Home / Self Care | Payer: 59 | Attending: Emergency Medicine | Admitting: Emergency Medicine

## 2014-09-21 ENCOUNTER — Encounter (HOSPITAL_COMMUNITY): Payer: Self-pay | Admitting: Emergency Medicine

## 2014-09-21 ENCOUNTER — Emergency Department (HOSPITAL_COMMUNITY): Payer: 59

## 2014-09-21 DIAGNOSIS — I10 Essential (primary) hypertension: Secondary | ICD-10-CM | POA: Diagnosis not present

## 2014-09-21 DIAGNOSIS — Z8742 Personal history of other diseases of the female genital tract: Secondary | ICD-10-CM | POA: Insufficient documentation

## 2014-09-21 DIAGNOSIS — Z86718 Personal history of other venous thrombosis and embolism: Secondary | ICD-10-CM | POA: Diagnosis not present

## 2014-09-21 DIAGNOSIS — R002 Palpitations: Secondary | ICD-10-CM | POA: Diagnosis not present

## 2014-09-21 DIAGNOSIS — R0600 Dyspnea, unspecified: Secondary | ICD-10-CM

## 2014-09-21 DIAGNOSIS — Z8719 Personal history of other diseases of the digestive system: Secondary | ICD-10-CM | POA: Insufficient documentation

## 2014-09-21 DIAGNOSIS — Z872 Personal history of diseases of the skin and subcutaneous tissue: Secondary | ICD-10-CM | POA: Insufficient documentation

## 2014-09-21 DIAGNOSIS — Z79899 Other long term (current) drug therapy: Secondary | ICD-10-CM | POA: Diagnosis not present

## 2014-09-21 DIAGNOSIS — Z7982 Long term (current) use of aspirin: Secondary | ICD-10-CM | POA: Diagnosis not present

## 2014-09-21 DIAGNOSIS — Z862 Personal history of diseases of the blood and blood-forming organs and certain disorders involving the immune mechanism: Secondary | ICD-10-CM | POA: Insufficient documentation

## 2014-09-21 DIAGNOSIS — Z791 Long term (current) use of non-steroidal anti-inflammatories (NSAID): Secondary | ICD-10-CM | POA: Insufficient documentation

## 2014-09-21 DIAGNOSIS — J9801 Acute bronchospasm: Secondary | ICD-10-CM

## 2014-09-21 DIAGNOSIS — Z86711 Personal history of pulmonary embolism: Secondary | ICD-10-CM | POA: Diagnosis not present

## 2014-09-21 LAB — BASIC METABOLIC PANEL
Anion gap: 5 (ref 5–15)
BUN: 10 mg/dL (ref 6–23)
CO2: 26 mmol/L (ref 19–32)
Calcium: 9 mg/dL (ref 8.4–10.5)
Chloride: 105 mmol/L (ref 96–112)
Creatinine, Ser: 1.09 mg/dL (ref 0.50–1.10)
GFR calc Af Amer: 65 mL/min — ABNORMAL LOW (ref >90)
GFR calc non Af Amer: 56 mL/min — ABNORMAL LOW (ref >90)
Glucose, Bld: 115 mg/dL — ABNORMAL HIGH (ref 70–99)
Potassium: 3.3 mmol/L — ABNORMAL LOW (ref 3.5–5.1)
Sodium: 136 mmol/L (ref 135–145)

## 2014-09-21 LAB — CBC
HCT: 36.6 % (ref 36.0–46.0)
Hemoglobin: 12.2 g/dL (ref 12.0–15.0)
MCH: 28.6 pg (ref 26.0–34.0)
MCHC: 33.3 g/dL (ref 30.0–36.0)
MCV: 85.9 fL (ref 78.0–100.0)
Platelets: 299 10*3/uL (ref 150–400)
RBC: 4.26 MIL/uL (ref 3.87–5.11)
RDW: 14 % (ref 11.5–15.5)
WBC: 4.3 10*3/uL (ref 4.0–10.5)

## 2014-09-21 LAB — I-STAT TROPONIN, ED: Troponin i, poc: 0 ng/mL (ref 0.00–0.08)

## 2014-09-21 MED ORDER — PREDNISONE 20 MG PO TABS
60.0000 mg | ORAL_TABLET | Freq: Once | ORAL | Status: AC
  Administered 2014-09-22: 60 mg via ORAL
  Filled 2014-09-21: qty 3

## 2014-09-21 MED ORDER — ALBUTEROL SULFATE (2.5 MG/3ML) 0.083% IN NEBU
2.5000 mg | INHALATION_SOLUTION | RESPIRATORY_TRACT | Status: DC | PRN
  Administered 2014-09-22: 2.5 mg via RESPIRATORY_TRACT
  Filled 2014-09-21: qty 3

## 2014-09-21 NOTE — ED Provider Notes (Signed)
CSN: [id number]     Arrival date & time 09/21/14  2157 History   First MD Initiated Contact with Patient 09/21/14 2310     Chief Complaint  Patient presents with  . Palpitations      HPI  She presents for evaluation of palpitations and cough. She's had a cough last 4-5 days increasing shortness of breath for the last 2 days. Is having more palpitations than typical for her tonight. Diagnosed with PVCs and takes metoprolol for this. States been taking Mucinex-D for the last 48 hours and has noticed an increase of her palpitations.  Dry nonproductive cough. Feels tight with taking a deep breath" like I can't get air in and out ofmy lungs ."  Past Medical History  Diagnosis Date  . Hypertension   . Breast mass 02/2008     Noted on mammogram March 11, 2008 3 cm simple cyst at 9 to 10:00 position of the right breast as well as multiple other smaller simple cyst, 2.6 cm mass at the 10:00 position of the right breast also representing complex cyst,  . 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 Fibroid uterus  2001     status post total abdominal hysterectomy, right and left salpingo-oophorectomy , done by Dr. [name]  . Anemia 2009     Baseline hemoglobin at 11, secondary to iron deficiency  . Deep vein thrombosis (DVT)      history of multiple  DVTs in 1989, 1989, 2004, on chronic anticoagulation with Coumadin  . PE (pulmonary embolism)      history of multiple PEs in 1984, 1989, 2004-  electronic data reviewed in [place] and EMR and I was not able to find single CT angiogram that was positive for pulmonary embolus nor was I able to find Doppler studies that were positive for DVTs  . Diverticulosis     noted colonoscopy 2010  . Eczema    Past Surgical History   Procedure Laterality Date  . Abdominal hysterectomy  04/13/2010    done by Dr. [name]  . Mastectomy, partial  03/2006     left partial mastectomy secondary to fibrocystic disease intraluminal fibroadenoma performed March 28, 2006 done by Dr. [name]  . Cholecystectomy      1997   Family History  Problem Relation Age of Onset  . Stroke Neg Hx   . Cancer    . Clotting disorder    . Gout Father    History  Substance Use Topics  . Smoking status: Never Smoker   . Smokeless tobacco: Not on file  . Alcohol Use: No   OB History    No data available     Review of Systems  Constitutional: Negative for fever, chills, diaphoresis, appetite change and fatigue.  HENT: Negative for mouth sores, sore throat and trouble swallowing.   Eyes: Negative for visual disturbance.  Respiratory: Positive for cough, chest tightness and shortness of breath. Negative for wheezing.   Cardiovascular: Negative for chest pain.  Gastrointestinal: Negative for nausea, vomiting, abdominal pain, diarrhea and abdominal distention.  Endocrine: Negative for polydipsia, polyphagia and polyuria.  Genitourinary: Negative for dysuria, frequency and hematuria.  Musculoskeletal: Negative for gait problem.  Skin: Negative for color change, pallor and rash.  Neurological: Negative for dizziness, syncope, light-headedness and headaches.  Hematological: Does not bruise/bleed easily.  Psychiatric/Behavioral: Negative for behavioral problems and confusion.      Allergies  Dilaudid  Home Medications   Prior to Admission medications   Medication Sig Start Date End Date Taking? Authorizing Provider  amLODipine (NORVASC) 10 MG tablet Take 1 tablet (10 mg total) by mouth daily. 08/23/14  Yes [name], MD  aspirin 325 MG tablet Take 325 mg by mouth daily.   Yes Historical Provider, MD  dextromethorphan-guaiFENesin (MUCINEX DM) 30-600 MG per 12 hr tablet Take 1 tablet by mouth 2 (two) times daily as needed for  cough.   Yes Historical Provider, MD  diclofenac (VOLTAREN) 75 MG EC tablet Take 75 mg by mouth 2 (two) times daily.   Yes Historical Provider, MD  metoprolol tartrate (LOPRESSOR) 25 MG tablet Take 1 tablet (25 mg total) by mouth 2 (two) times daily. 06/22/14  Yes Dorothy Spark, MD  naproxen sodium (ANAPROX) 220 MG tablet Take 440 mg by mouth 2 (two) times daily as needed (pain).   Yes Historical Provider, MD  albuterol (PROVENTIL HFA;VENTOLIN HFA) 108 (90 BASE) MCG/ACT inhaler Inhale 1-2 puffs into the lungs every 6 (six) hours as needed for wheezing. 09/22/14   [name], MD  famotidine (PEPCID) 20 MG tablet Take 1 tablet (20 mg total) by mouth 2 (two) times daily as needed for heartburn or indigestion. Patient not taking: Reported on 09/21/2014 01/06/14   [name], MD  predniSONE (DELTASONE) 20 MG tablet Take 1 tablet (20 mg total) by mouth daily with breakfast. 09/22/14   [name], MD   BP 136/86 mmHg  Pulse 81  Temp(Src) 98 F (36.7 C) (Oral)  Resp 25  SpO2 97% Physical Exam  Constitutional: She is oriented to person, place, and time. She appears well-developed and well-nourished. No distress.  HENT:  Head: Normocephalic.  Eyes: Conjunctivae are normal. Pupils are equal, round, and reactive to light. No scleral icterus.  Neck: Normal range of motion. Neck supple. No thyromegaly present.  Cardiovascular: Normal rate and regular rhythm.  Exam reveals no gallop and no friction rub.   No murmur heard. Pulmonary/Chest: Effort normal. No respiratory distress. She has wheezes in the right upper field, the right middle field, the right lower field, the left upper field, the left middle field and the left lower field. She has no rales.  Abdominal: Soft. Bowel sounds are normal. She exhibits no distension. There is no tenderness. There is no rebound.  Musculoskeletal: Normal range of motion.  Neurological: She is alert and oriented to person, place, and time.  Skin: Skin is warm and dry. No  rash noted.  Psychiatric: She has a normal mood and affect. Her behavior is normal.    ED Course  Procedures (including critical care time) Labs Review Labs Reviewed  BASIC METABOLIC PANEL - Abnormal; Notable for the following:    Potassium 3.3 (*)    Glucose, Bld 115 (*)    GFR calc non Af Amer 56 (*)    GFR calc Af Amer 65 (*)    All other components within normal limits  CBC  I-STAT TROPOININ, ED    Imaging Review Dg Chest 2 View  09/21/2014   CLINICAL DATA:  Acute onset of shortness of breath and chest tightening. Initial encounter.  EXAM: CHEST  2 VIEW  COMPARISON:  Chest radiograph performed 07/19/2013, and CTA of the chest performed 01/06/2014  FINDINGS: The lungs are mildly hypoexpanded. Mild vascular crowding and vascular congestion or seen.  Minimal left basilar opacity likely reflects atelectasis. There is no evidence of pleural effusion or pneumothorax.  The heart is mildly enlarged. No acute osseous abnormalities are seen. Clips are noted within the right upper quadrant, reflecting prior cholecystectomy.  IMPRESSION: Mild vascular congestion and mild cardiomegaly, without significant pulmonary edema. Minimal left basilar opacity likely reflects atelectasis.   Electronically Signed   By: Garald Balding M.D.   On: 09/21/2014 23:52     EKG Interpretation   Date/Time:  Tuesday September 21 2014 22:13:49 EST Ventricular Rate:  100 PR Interval:  137 QRS Duration: 85 QT Interval:  347 QTC Calculation: 447 R Axis:   57 Text Interpretation:  Sinus tachycardia Low voltage, precordial leads  Confirmed by Jeneen Rinks  MD, [place] ([id number]) on 09/21/2014 11:11:25 PM      MDM   Final diagnoses:  Dyspnea  Bronchospasm  Palpitations    Chest x-ray shows some vascular congestion. No edema. After one neb of albuterol she still feels "100% better. Her lungs are clear. She locked today. She appropriate for outpatient treatment. Primary care follow-up. Prescription for prednisone, and albuterol  MDI.    [name], MD 09/22/14 [phone]

## 2014-09-21 NOTE — ED Notes (Signed)
Pt states she has been having really bad heart palpitations tonight  Pt states she has had a cold for the past few days with cough and chest congestion  Pt states she normally has palpitations but as the cold has gotten worse so have they  Pt states she has also had increased shortness of breath and a "pinching" sensation in her chest and stomach

## 2014-09-22 MED ORDER — ALBUTEROL SULFATE HFA 108 (90 BASE) MCG/ACT IN AERS: 1.0000 | INHALATION_SPRAY | Freq: Four times a day (QID) | RESPIRATORY_TRACT | Status: DC | PRN

## 2014-09-22 MED ORDER — PREDNISONE 20 MG PO TABS: 20.0000 mg | ORAL_TABLET | Freq: Every day | ORAL | Status: DC

## 2014-09-22 NOTE — Discharge Instructions (Signed)
Bronchospasm °A bronchospasm is a spasm or tightening of the airways going into the lungs. During a bronchospasm breathing becomes more difficult because the airways get smaller. When this happens there can be coughing, a whistling sound when breathing (wheezing), and difficulty breathing. Bronchospasm is often associated with asthma, but not all patients who experience a bronchospasm have asthma. °CAUSES  °A bronchospasm is caused by inflammation or irritation of the airways. The inflammation or irritation may be triggered by:  °· Allergies (such as to animals, pollen, food, or mold). Allergens that cause bronchospasm may cause wheezing immediately after exposure or many hours later.   °· Infection. Viral infections are believed to be the most common cause of bronchospasm.   °· Exercise.   °· Irritants (such as pollution, cigarette smoke, strong odors, aerosol sprays, and paint fumes).   °· Weather changes. Winds increase molds and pollens in the air. Rain refreshes the air by washing irritants out. Cold air may cause inflammation.   °· Stress and emotional upset.   °SIGNS AND SYMPTOMS  °· Wheezing.   °· Excessive nighttime coughing.   °· Frequent or severe coughing with a simple cold.   °· Chest tightness.   °· Shortness of breath.   °DIAGNOSIS  °Bronchospasm is usually diagnosed through a history and physical exam. Tests, such as chest X-rays, are sometimes done to look for other conditions. °TREATMENT  °· Inhaled medicines can be given to open up your airways and help you breathe. The medicines can be given using either an inhaler or a nebulizer machine. °· Corticosteroid medicines may be given for severe bronchospasm, usually when it is associated with asthma. °HOME CARE INSTRUCTIONS  °· Always have a plan prepared for seeking medical care. Know when to call your health care provider and local emergency services (911 in the [place] Know where you can access local emergency care. °· Only take medicines as  directed by your health care provider. °· If you were prescribed an inhaler or nebulizer machine, ask your health care provider to explain how to use it correctly. Always use a spacer with your inhaler if you were given one. °· It is necessary to remain calm during an attack. Try to relax and breathe more slowly.  °· Control your home environment in the following ways:   °¨ Change your heating and air conditioning filter at least once a month.   °¨ Limit your use of fireplaces and wood stoves. °¨ Do not smoke and do not allow smoking in your home.   °¨ Avoid exposure to perfumes and fragrances.   °¨ Get rid of pests (such as roaches and mice) and their droppings.   °¨ Throw away plants if you see mold on them.   °¨ Keep your house clean and dust free.   °¨ Replace carpet with wood, tile, or vinyl flooring. Carpet can trap dander and dust.   °¨ Use allergy-proof pillows, mattress covers, and box spring covers.   °¨ Wash bed sheets and blankets every week in hot water and dry them in a dryer.   °¨ Use blankets that are made of polyester or cotton.   °¨ Wash hands frequently. °SEEK MEDICAL CARE IF:  °· You have muscle aches.   °· You have chest pain.   °· The sputum changes from clear or white to yellow, green, gray, or bloody.   °· The sputum you cough up gets thicker.   °· There are problems that may be related to the medicine you are given, such as a rash, itching, swelling, or trouble breathing.   °SEEK IMMEDIATE MEDICAL CARE IF:  °· You have worsening wheezing and coughing even   after taking your prescribed medicines.   °· You have increased difficulty breathing.   °· You develop severe chest pain. °MAKE SURE YOU:  °· Understand these instructions. °· Will watch your condition. °· Will get help right away if you are not doing well or get worse. °Document Released: 08/09/2003 Document Revised: 08/11/2013 Document Reviewed: 01/26/2013 °ExitCare® Patient Information ©2015 [employer]. This information is not  intended to replace advice given to you by your health care provider. Make sure you discuss any questions you have with your health care provider. ° °

## 2014-09-27 ENCOUNTER — Encounter: Payer: Self-pay | Admitting: Cardiology

## 2014-09-27 ENCOUNTER — Ambulatory Visit (INDEPENDENT_AMBULATORY_CARE_PROVIDER_SITE_OTHER): Payer: 59 | Admitting: Cardiology

## 2014-09-27 VITALS — BP 122/74 | HR 78 | Ht 71.0 in | Wt 283.0 lb

## 2014-09-27 DIAGNOSIS — R0600 Dyspnea, unspecified: Secondary | ICD-10-CM

## 2014-09-27 DIAGNOSIS — I10 Essential (primary) hypertension: Secondary | ICD-10-CM

## 2014-09-27 DIAGNOSIS — R002 Palpitations: Secondary | ICD-10-CM

## 2014-09-27 DIAGNOSIS — R0609 Other forms of dyspnea: Secondary | ICD-10-CM

## 2014-09-27 NOTE — Progress Notes (Signed)
Patient ID: BROOKELYNNE DIMPERIO, female   DOB: Mar 02, 1961, 54 y.o.   MRN: [id number]    Patient Name: [name] Date of Encounter: 09/27/2014  Primary Care Provider:  Charlott Rakes, MD Primary Cardiologist:  Dorothy Spark  Problem List   Past Medical History  Diagnosis Date  . Hypertension   . Breast mass 02/2008     Noted on mammogram March 11, 2008 3 cm simple cyst at 9 to 10:00 position of the right breast as well as multiple other smaller simple cyst, 2.6 cm mass at the 10:00 position of the right breast also representing complex cyst,  . 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 Fibroid uterus  2001     status post total abdominal hysterectomy, right and left salpingo-oophorectomy , done by Dr. [name]  . Anemia 2009     Baseline hemoglobin at 11, secondary to iron deficiency  . Deep vein thrombosis (DVT)      history of multiple  DVTs in 1989, 1989, 2004, on chronic anticoagulation with Coumadin  . PE (pulmonary embolism)      history of multiple PEs in 1984, 1989, 2004-  electronic data reviewed in [place] and EMR and I was not able to find single CT angiogram that was positive for pulmonary embolus nor was I able to find Doppler studies that were positive for DVTs  . Diverticulosis     noted colonoscopy 2010  . Eczema    Past Surgical History  Procedure Laterality Date  . Abdominal hysterectomy  04/13/2010    done by Dr. [name]  . Mastectomy, partial  03/2006     left partial mastectomy secondary to fibrocystic disease intraluminal fibroadenoma performed March 28, 2006 done by Dr. [name]  . Cholecystectomy      1997    Allergies  Allergies  Allergen Reactions  . Dilaudid [Hydromorphone Hcl] Nausea Only    HPI  A pleasant  54 year old female with prior medical history of hypertension and obesity and significant family history of premature coronary artery disease who is coming for evaluation of palpitations, progressively worsening dyspnea on exertion and fatigue. The palpitations started earlier this year and have been obscuring irregularly sometimes happening almost every day and sometimes not happening for a few days. The patient states that the start slowly and progressively get worse they can last seconds to minutes and on 2 occasions they were associated with chest pressure and shortness of breath. She also noticed to have worsening dyspnea on exertion and fatigue while walking at work. She is especially concerned since on her father's side had multiple family members with myocardial infarctions. Her father had his first myocardial infarction at age 13 and she has multiple uncles and cousins with early myocardial infarctions. She is not very physically active and is trying to improve her diet. She denies orthopnea, PND, lower strandy edema or prior syncope.  09/27/2014 - the patient is coming for six-month follow-up, she underwent stress testing that showed poor exercise tolerance, no prior scar and no ischemia. Her Holter monitor showed one episode of nonsustained VT lasting 3 beats. She was started on metoprolol 25 mg twice a day that she is tolerating very well with improvement of palpitations. She is inquiring about stopping medication. She denies any orthopnea, paroxysmal nocturnal dyspnea  or lower extremity edema or chest pain.    Home Medications  Prior to Admission medications   Medication Sig Start Date End Date Taking? Authorizing Provider  amLODipine (NORVASC) 10 MG tablet Take 10 mg by mouth daily.   Yes Historical Provider, MD  aspirin 325 MG tablet Take 325 mg by mouth daily.   Yes Historical Provider, MD  famotidine (PEPCID) 20 MG tablet Take 1 tablet (20 mg total) by mouth 2 (two) times daily as  needed for heartburn or indigestion. 01/06/14  Yes [name], MD  ferrous sulfate 325 (65 FE) MG tablet Take 325 mg by mouth daily. Take with food.    Yes Historical Provider, MD    Family History  Family History  Problem Relation Age of Onset  . Stroke Neg Hx   . Cancer    . Clotting disorder    . Gout Father     Social History  History   Social History  . Marital Status: Married    Spouse Name: N/A    Number of Children: N/A  . Years of Education: N/A   Occupational History  . Not on file.   Social History Main Topics  . Smoking status: Never Smoker   . Smokeless tobacco: Not on file  . Alcohol Use: No  . Drug Use: No  . Sexual Activity: Not on file   Other Topics Concern  . Not on file   Social History Narrative     Review of Systems, as per HPI, otherwise negative General:  No chills, fever, night sweats or weight changes.  Cardiovascular:  No chest pain, dyspnea on exertion, edema, orthopnea, palpitations, paroxysmal nocturnal dyspnea. Dermatological: No rash, lesions/masses Respiratory: No cough, dyspnea Urologic: No hematuria, dysuria Abdominal:   No nausea, vomiting, diarrhea, bright red blood per rectum, melena, or hematemesis Neurologic:  No visual changes, wkns, changes in mental status. All other systems reviewed and are otherwise negative except as noted above.  Physical Exam  Blood pressure 122/74, pulse 78, height 5\' 11"  (1.803 m), weight 283 lb (128.368 kg), SpO2 96 %.  General: Pleasant, NAD Psych: Normal affect. Neuro: Alert and oriented X 3. Moves all extremities spontaneously. HEENT: Normal  Neck: Supple without bruits or JVD. Lungs:  Resp regular and unlabored, CTA. Heart: RRR no s3, s4, or murmurs. Abdomen: Soft, non-tender, non-distended, BS + x 4.  Extremities: No clubbing, cyanosis or edema. DP/PT/Radials 2+ and equal bilaterally.  Labs:  No results for input(s): CKTOTAL, CKMB, TROPONINI in the last 72 hours. Lab Results    Component Value Date   WBC 4.3 09/21/2014   HGB 12.2 09/21/2014   HCT 36.6 09/21/2014   MCV 85.9 09/21/2014   PLT 299 09/21/2014    Lab Results  Component Value Date   DDIMER  09/30/2010    0.22        AT THE INHOUSE ESTABLISHED CUTOFF VALUE OF 0.48 ug/mL FEU, THIS ASSAY HAS BEEN DOCUMENTED IN THE LITERATURE TO HAVE A SENSITIVITY AND NEGATIVE PREDICTIVE VALUE OF AT LEAST 98 TO 99%.  THE TEST RESULT SHOULD BE CORRELATED WITH AN ASSESSMENT OF THE CLINICAL PROBABILITY OF DVT / VTE.   Invalid input(s): POCBNP    Component Value Date/Time   NA 136 09/21/2014 2225   K 3.3* 09/21/2014 2225   CL 105 09/21/2014 2225   CO2 26 09/21/2014 2225   GLUCOSE 115* 09/21/2014 2225   BUN 10 09/21/2014 2225   CREATININE 1.09 09/21/2014 2225   CREATININE 0.89 12/30/2013  1715   CALCIUM 9.0 09/21/2014 2225   PROT 7.1 12/30/2013 1715   ALBUMIN 4.2 12/30/2013 1715   AST 21 12/30/2013 1715   ALT 37* 12/30/2013 1715   ALKPHOS 74 12/30/2013 1715   BILITOT 0.3 12/30/2013 1715   GFRNONAA 56* 09/21/2014 2225   GFRNONAA 73 12/30/2013 1715   GFRAA 65* 09/21/2014 2225   GFRAA 84 12/30/2013 1715   Lab Results  Component Value Date   CHOL 198 05/12/2013   HDL 48 05/12/2013   LDLCALC 126* 05/12/2013   TRIG 120 05/12/2013    Accessory Clinical Findings  Echocardiogram - 2009 Overall left ventricular systolic function was normal. Left ventricular ejection fraction was estimated to be 65 %. There were no left ventricular regional wall motion abnormalities. Left ventricular wall thickness was at the upper limits of normal. - Normal aortic valve - Normal mitral valve  ECG - SR, normal ECG  Exercise nuclear stress test: 02/18/2014 Quantitative Gated Spect Images QGS EDV: 101 ml QGS ESV: 44 ml  Impression Exercise Capacity: Fair exercise capacity. BP Response: Normal blood pressure response. Clinical Symptoms: Shortness of breath ECG Impression: No significant ST segment change  suggestive of ischemia. Comparison with Prior Nuclear Study: No images to compare  Overall Impression: Low risk stress nuclear study with no area of ischemia identified.  LV Ejection Fraction: 56%. LV Wall Motion: NL LV Function; NL Wall Motion  Candee Furbish, MD    Assessment & Plan  54 year old female  1. Worsening dyspnea on exertion and fatigue - risk factors include physical inactivity, obesity, hypertension and premature CAD in multiple family members. Her stress test showed very poor functional capacity, but was negative for prior myocardial infarction or ischemia. She is advised on necessity of regular exercise and explained the type and intensity of exercise based on current preventive guidelines.  2. Palpitations -e-cardio monitor for 30 days showed one episode of nonsustained VT lasting 3 beats she was started on beta blocker with improvement of her symptoms. She is trying to be gained of what is okay as long as she doesn't mind having more frequent palpitations. TSH was normal.  3. Hypertension - well controlled  4. Hyperlipidemia - LDL 126 with goal less than 100. She is trying to improve her diet we will repeat her cholesterol in couple months if still elevated will consider therapy.  Followup as needed.    Dorothy Spark, MD, [facility] 09/27/2014, 9:23 AM

## 2014-09-27 NOTE — Patient Instructions (Signed)
Your physician recommends that you continue on your current medications as directed. Please refer to the Current Medication list given to you today.    Your physician recommends that you schedule a follow-up appointment in: AS NEEDED WITH DR NELSON  

## 2014-11-08 ENCOUNTER — Ambulatory Visit (INDEPENDENT_AMBULATORY_CARE_PROVIDER_SITE_OTHER): Payer: 59 | Admitting: Internal Medicine

## 2014-11-08 ENCOUNTER — Encounter: Payer: Self-pay | Admitting: Internal Medicine

## 2014-11-08 VITALS — BP 141/74 | HR 64 | Temp 97.9°F | Ht 71.0 in | Wt 280.4 lb

## 2014-11-08 DIAGNOSIS — M1990 Unspecified osteoarthritis, unspecified site: Secondary | ICD-10-CM | POA: Insufficient documentation

## 2014-11-08 DIAGNOSIS — I10 Essential (primary) hypertension: Secondary | ICD-10-CM

## 2014-11-08 DIAGNOSIS — M1711 Unilateral primary osteoarthritis, right knee: Secondary | ICD-10-CM

## 2014-11-08 DIAGNOSIS — M19072 Primary osteoarthritis, left ankle and foot: Secondary | ICD-10-CM

## 2014-11-08 MED ORDER — ACETAMINOPHEN-CODEINE #3 300-30 MG PO TABS
1.0000 | ORAL_TABLET | Freq: Three times a day (TID) | ORAL | Status: DC | PRN
Start: 2014-11-08 — End: 2016-01-10

## 2014-11-08 NOTE — Patient Instructions (Signed)
It was a pleasure to see you today. You may take the tylenol #3 every eight hours as needed for the pain. We have placed a referral to Sports Medicine for your pain.3 Please return to clinic or seek medical attention if you have any new or worsening joint pain, warmness, swelling, fever, or other worrisome medical condition. We look forward to seeing you again in as needed.  Lottie Mussel, MD  General Instructions:   Please try to bring all your medicines next time. This will help [place] keep you safe from mistakes.   Progress Toward Treatment Goals:  Treatment Goal 11/08/2014  Blood pressure unchanged    Self Care Goals & Plans:  Self Care Goal 11/08/2014  Manage my medications take my medicines as prescribed; bring my medications to every visit; refill my medications on time  Monitor my health -  Eat healthy foods drink diet soda or water instead of juice or soda; eat more vegetables; eat foods that are low in salt; eat baked foods instead of fried foods; eat fruit for snacks and desserts  Be physically active -    No flowsheet data found.   Care Management & Community Referrals:  Referral 05/12/2013  Referrals made for care management support none needed

## 2014-11-08 NOTE — Progress Notes (Signed)
   Subjective:    Patient ID: [name], female    DOB: 08-08-1961, 54 y.o.   MRN: [id number]  HPI  [name] is a 54 year old woman with HTN, multiple previous DVT/PE, and osteoarthritis who presents for R knee and L ankle pain and swelling. She has had this issue for about a year or two. Over this time it has gotten progressively worse. She describes the pain as a grinding bone on bone feeling. The pain is worse with movement. While they are currently not swollen, she says that they sometimes become so swollen she has difficulty walking. She says that diclofenac, which is what she currently has, does not help at all. She has tried mobic and naproxen which did not help. She was seen in 2014 by Sports Medicine and had steroid injection in knees which helped. She said she did not go back to Sports Medicine because her sister got cancer and she was busy taking care of her.   Review of Systems  Constitutional: Negative for fever, chills and diaphoresis.  Respiratory: Negative for shortness of breath.   Cardiovascular: Negative for chest pain.  Gastrointestinal: Negative for nausea, vomiting, abdominal pain, diarrhea and constipation.  Musculoskeletal: Positive for arthralgias. Negative for joint swelling.  Neurological: Negative for dizziness, weakness, light-headedness and numbness.       Objective:   Physical Exam  Constitutional: She appears well-developed and well-nourished. No distress.  HENT:  Head: Normocephalic and atraumatic.  Mouth/Throat: Oropharynx is clear and moist.  Eyes: EOM are normal. Pupils are equal, round, and reactive to light.  Cardiovascular: Normal rate, regular rhythm, normal heart sounds and intact distal pulses.  Exam reveals no gallop and no friction rub.   No murmur heard. Pulmonary/Chest: Effort normal and breath sounds normal. No respiratory distress. She has no wheezes.  Abdominal: Soft. Bowel sounds are normal. She exhibits no distension. There is no  tenderness.  Musculoskeletal:  Full ROM and 5/5 strength in both legs and feet. 2+ DP pulses. No knee or ankle swelling or warmth b/l. No joint line tenderness on knees. Pain on movement of L ankle in any direction  Skin: She is not diaphoretic.  Vitals reviewed.         Assessment & Plan:

## 2014-11-08 NOTE — Progress Notes (Signed)
Internal Medicine Clinic Attending  Case discussed with Dr. Rothman at the time of the visit.  We reviewed the resident's history and exam and pertinent patient test results.  I agree with the assessment, diagnosis, and plan of care documented in the resident's note. 

## 2014-11-08 NOTE — Assessment & Plan Note (Addendum)
Ms Heacox presents for progression of her previous LE joint pain. Joints do not appear septic as no swelling, warmth, nothing to aspirate. Her description of the pain as grinding bone on bone sensation is classic for osteoarthritis/DJD. She has previous R knee x-ray 06/2013 with mild degenerative change in the patella femoral articulation and L foot (no ankle film available of slight hallus valgus deformity at first MTP. She was previously seen by sports medicine and said she had relief of pain with steroid injections in knees. She did not go back due to being too busy caring for her sister with cancer. She is agreeable now to returning. She said that her previous use of NSAIDs provided no relief so will take off her med list. -referral to sports medicine -tylenol #3 q8hprn #30 w 1 refill

## 2014-11-08 NOTE — Assessment & Plan Note (Signed)
BP Readings from Last 3 Encounters:  11/08/14 141/74  09/27/14 122/74  09/22/14 136/86    Lab Results  Component Value Date   NA 136 09/21/2014   K 3.3* 09/21/2014   CREATININE 1.09 09/21/2014    Assessment: Blood pressure control: mildly elevated Progress toward BP goal:  unchanged Comments: BP minimally elevated above goal while on amlodipine 10 mg daily. She was also previously on lopressor 25 mg bid which was given to her for palpitations. She says her cardiologist Dr Meda Coffee told her she can stop taking this and she has not been using beta blocker.  Plan: Medications:  continue current medications Educational resources provided: brochure (denies) Self management tools provided:   Other plans: Encouraged weight loss as this will also help with her arthritic pain

## 2014-12-23 ENCOUNTER — Telehealth: Payer: Self-pay | Admitting: Internal Medicine

## 2014-12-23 NOTE — Telephone Encounter (Signed)
Call to patient to confirm appointment for 12/24/14 at 3:45 lmtcb

## 2014-12-24 ENCOUNTER — Encounter: Payer: Self-pay | Admitting: Internal Medicine

## 2015-03-29 ENCOUNTER — Emergency Department (HOSPITAL_COMMUNITY)
Admission: EM | Admit: 2015-03-29 | Discharge: 2015-03-29 | Disposition: A | Discharge status: Home / Self Care | Payer: 59 | Attending: Emergency Medicine | Admitting: Emergency Medicine

## 2015-03-29 ENCOUNTER — Encounter (HOSPITAL_COMMUNITY): Payer: Self-pay | Admitting: Emergency Medicine

## 2015-03-29 DIAGNOSIS — Z862 Personal history of diseases of the blood and blood-forming organs and certain disorders involving the immune mechanism: Secondary | ICD-10-CM | POA: Insufficient documentation

## 2015-03-29 DIAGNOSIS — Z79899 Other long term (current) drug therapy: Secondary | ICD-10-CM | POA: Insufficient documentation

## 2015-03-29 DIAGNOSIS — H6121 Impacted cerumen, right ear: Secondary | ICD-10-CM | POA: Insufficient documentation

## 2015-03-29 DIAGNOSIS — Z86018 Personal history of other benign neoplasm: Secondary | ICD-10-CM | POA: Insufficient documentation

## 2015-03-29 DIAGNOSIS — Z86718 Personal history of other venous thrombosis and embolism: Secondary | ICD-10-CM | POA: Insufficient documentation

## 2015-03-29 DIAGNOSIS — K047 Periapical abscess without sinus: Secondary | ICD-10-CM | POA: Insufficient documentation

## 2015-03-29 DIAGNOSIS — Z872 Personal history of diseases of the skin and subcutaneous tissue: Secondary | ICD-10-CM | POA: Insufficient documentation

## 2015-03-29 DIAGNOSIS — I10 Essential (primary) hypertension: Secondary | ICD-10-CM | POA: Insufficient documentation

## 2015-03-29 DIAGNOSIS — Z86711 Personal history of pulmonary embolism: Secondary | ICD-10-CM | POA: Insufficient documentation

## 2015-03-29 MED ORDER — CLINDAMYCIN HCL 300 MG PO CAPS
300.0000 mg | ORAL_CAPSULE | Freq: Once | ORAL | Status: AC
  Administered 2015-03-29: 300 mg via ORAL
  Filled 2015-03-29: qty 1

## 2015-03-29 MED ORDER — TETRACAINE HCL 0.5 % OP SOLN
1.0000 [drp] | Freq: Once | OPHTHALMIC | Status: AC
  Administered 2015-03-29: 1 [drp] via OPHTHALMIC
  Filled 2015-03-29: qty 2

## 2015-03-29 MED ORDER — OXYCODONE-ACETAMINOPHEN 5-325 MG PO TABS: 1.0000 | ORAL_TABLET | ORAL | Status: DC | PRN

## 2015-03-29 MED ORDER — CARBAMIDE PEROXIDE 6.5 % OT SOLN: 5.0000 [drp] | Freq: Two times a day (BID) | OTIC | Status: AC

## 2015-03-29 MED ORDER — CLINDAMYCIN HCL 300 MG PO CAPS: 300.0000 mg | ORAL_CAPSULE | Freq: Four times a day (QID) | ORAL | Status: DC

## 2015-03-29 NOTE — Discharge Instructions (Signed)
°Emergency Department Resource Guide °1) Find a Doctor and Pay Out of Pocket °Although you won't have to find out who is covered by your insurance plan, it is a good idea to ask around and get recommendations. You will then need to call the office and see if the doctor you have chosen will accept you as a new patient and what types of options they offer for patients who are self-pay. Some doctors offer discounts or will set up payment plans for their patients who do not have insurance, but you will need to ask so you aren't surprised when you get to your appointment. ° °2) Contact Your Local Health Department °Not all health departments have doctors that can see patients for sick visits, but many do, so it is worth a call to see if yours does. If you don't know where your local health department is, you can check in your phone book. The CDC also has a tool to help you locate your state's health department, and many state websites also have listings of all of their local health departments. ° °3) Find a Walk-in Clinic °If your illness is not likely to be very severe or complicated, you may want to try a walk in clinic. These are popping up all over the country in pharmacies, drugstores, and shopping centers. They're usually staffed by nurse practitioners or physician assistants that have been trained to treat common illnesses and complaints. They're usually fairly quick and inexpensive. However, if you have serious medical issues or chronic medical problems, these are probably not your best option. ° °No Primary Care Doctor: °- Call [facility] at  832-8000 - they can help you locate a primary care doctor that  accepts your insurance, provides certain services, etc. °- Physician Referral Service- 1-800-533-3463 ° °Chronic Pain Problems: °Organization         Address  Phone   Notes  °[facility]  (336) 297-2271 Patients need to be referred by their primary care doctor.  ° °Medication  Assistance: °Organization         Address  Phone   Notes  °[place] [facility] [address] °[place], [place] [address] (336) 641-8030 --Must be a resident of [place] °-- Must have NO insurance coverage whatsoever (no Medicaid/ Medicare, etc.) °-- The pt. MUST have a primary care doctor that directs their care regularly and follows them in the community °  °[facility]  (866) 331-1348   °[employer]  (888) 892-1162   ° °Agencies that provide inexpensive medical care: °Organization         Address  Phone   Notes  °[facility]  (336) 832-8035   °[facility]    (336) 832-7272   °[facility] [address] °[place],  [address] (336) 832-4777   °[facility] [address], °[place] (336) 271-4999   °[facility]    (336) 373-0678   °[facility]    (336) 272-1050   °[facility] ° [address], [place] Phone:  (336) 832-4444, Fax:  (336) 832-4440 Hours of Operation:  9 am - 6 pm, M-F.  Also accepts Medicaid/Medicare and self-pay.  °[facility] ° [address], [place] Phone: (336) 832-3150, Fax: (336) 832-3151. Hours of Operation:  8:30 am - 5:30 pm, M-F.  Also accepts Medicaid and self-pay.  °[facility] [place] [address], [place] Phone: (336) 878-6027   °[facility] [address], [place], [place] (336)723-1848, Ext. 123 Mondays & Thursdays: 7-9 AM.  First 15 patients are seen on a first come, first serve basis. °  ° °Medicaid-accepting [place] Providers: ° °Organization         Address  Phone   Notes  °[facility] [address], [place] (336) 641-2100 Also accepts self-pay patients.  °[facility] [address], [place] ° (336) 856-9996   °[facility] [address], [place]  (336) 288-8857   °[facility] [address] [place] [address], [place] (336) 299-7000   °Veita Bland [address], [place]  ° (336) 373-1557 Only accepts [place] Access Medicaid patients after they have their name applied to their card.  ° °Self-Pay (no insurance) in [place]: ° °Organization         Address  Phone   Notes  °Sickle Cell Patients, [facility] [address], [place] (336) 832-1970   °[facility] [address], [place] (336) 832-4400   ° [facility] ° [address], [place] (336) 992-4800   °[facility]/Dr. Osei-Bonsu ° [address] [place] [address], [place] or [address], [place] (336) 841-8500 Phone number for both [place] and [place] locations is the same.  °[facility] [address], [place] (336) 299-0000   °[facility] [address] [place] [address], [place] or [address] (336) 852-7530 °(336) 878-2260   °[facility] [address], [place] (336) 350-1642, phone; (336) 294-5005, fax Sees patients 1st and 3rd Saturday of every month.  Must not qualify for public or private insurance (i.e. Medicaid, Medicare, [place] Health Choice, Veterans' Benefits) • Household income should be no more than 200% of the poverty level •The clinic cannot treat you if you are pregnant or think you are pregnant • Sexually transmitted diseases are not treated at the clinic.  ° ° °Dental Care: °Organization         Address  Phone  Notes  °[place] [facility] [address], [place] (336) 641-6152 Accepts children up to age 21 who are enrolled in Medicaid or [place] Health Choice; pregnant women with a Medicaid card; and children who have applied for Medicaid or [place] Health Choice, but were declined, whose parents can pay a reduced fee at time of service.  °[place]  [facility] [place]  [address], [place] (336) 641-7733 Accepts children up to age 21 who are enrolled in Medicaid or [place] Health Choice; pregnant women with a Medicaid card; and children who have applied for Medicaid or [place] Health Choice, but were declined, whose parents can pay a reduced fee at time of service.  °[facility] ° [address],  (336) 641-4533 Patients are seen by appointment only. Walk-ins are not accepted. [facility] will see patients 18 years of age and older. °Monday - Tuesday (8am-5pm) °Most Wednesdays (8:30-5pm) °$30 per visit, cash only  °[facility] ° [address], [place] (336) 641-4533 Patients are seen by appointment only. Walk-ins are not accepted. [facility] will see patients 18 years of age and older. °One   Wednesday Evening (Monthly: Volunteer Based).  $30 per visit, cash only  °[facility]  (919) 537-3737 for adults; Children under age 4, call Graduate Pediatric Dentistry at (919) [phone]. Children aged 4-14, please call (919) 537-3737 to request a pediatric application. ° Dental services are provided in all areas of dental care including fillings, crowns and bridges, complete and partial dentures, implants, gum treatment, root canals, and extractions. Preventive care is also provided. Treatment is provided to both adults and children. °Patients are selected via a lottery and there is often a waiting list. °  °[facility] [address], °[place] ° (336) 763-8833 [email or web address] °  °[facility] [address], [place], [place] (336)723-1848, Ext. 123 Second and Fourth Thursday of each month, opens at 6:30 AM; Clinic ends at 9 AM.  Patients are seen on a first-come first-served basis, and a limited number are seen during each clinic.  ° °[facility] ° [address], [place], [place] (336) 723-7904    Eligibility Requirements °You must have lived in [place], [place], or [place] counties for at least the last three months. °  You cannot be eligible for state or federal sponsored healthcare insurance, including Veterans Administration, Medicaid, or Medicare. °  You generally cannot be eligible for healthcare insurance through your employer.  °  How to apply: °Eligibility screenings are held every Tuesday and Wednesday afternoon from 1:00 pm until 4:00 pm. You do not need an appointment for the interview!  °[facility] [address], [place], [place] 336-631-2330   °[facility] [facility]  336-342-8273   °[facility]  336-703-3100   °[facility]  336-570-6415   ° °Behavioral Health Resources in the Community: °Intensive Outpatient Programs °Organization         Address  Phone  Notes  °[facility] [address], [place], [place] 336-878-6098   °[facility] [address], [place], [place] 336-832-9800   °[facility] [address], [place], [place] ° 336-882-2125   °[facility] [address],  °[place], [place] 1-800-853-5163 or 336-641-4981   °Substance Abuse Resources °Organization         Address  Phone  Notes  °[facility]  336-882-2125   °[facility]  336-784-9470   °[facility]  336-285-9073   °[facility]  336-845-3988   °Residential & Outpatient Substance Abuse Program  1-800-659-3381   °Psychological Services °Organization         Address  Phone  Notes  °[facility]  336- 832-9600   °[facility]  336- 378-7881   °[facility] [address], [place] 1-800-853-5163 or 336-641-4981   ° °Mobile Crisis Teams °Organization         Address  Phone  Notes  °[facility], Mobile Crisis Care Unit  1-877-626-1772   °Assertive °[facility] ° [address]  [place], [place] 336-834-9664   °Sharon DeEsch [address] °[place] 336-554-5454   ° °Self-Help/Support Groups °Organization         Address  Phone             Notes  °[facility] [place] - variety of support groups  336- 373-1402 Call for more information  °Narcotics Anonymous (NA), [facility] [address], °[place]  2 meetings at this location  ° °  Residential Treatment Programs °Organization         Address  Phone  Notes  °[facility] [address],    °[place]  1-866-801-8205   °[facility] ° [address], [place], [place] 704-293-8524   °[facility] [address], [place] 336-845-3988 Admissions: 8am-3pm M-F  °[facility] [address]    °[place], [place] 336-841-1104   °[facility] [address], [place], [place] 336-379-7146   °[facility] [address]  °[place], [place] 336-285-9073   °[facility] Intensive Outpatient [address], [place], [place] 336-852-3033   °[facility] [address]  °[place], [place] 1-877-615-2722 or 336-784-9470   °[facility] [address] [place], [place] 336-227-7417 Accepts Medicaid  °[facility] [address]  °[place] 1-800-659-3381 Substance Abuse/Addiction Treatment  ° °[place] Behavioral Health Resources °Organization         Address  Phone  Notes  °[facility]  (888) 581-9988   °Julie Brannon, PhD [address] [place], [place]   (336) 349-5553 or (336) 951-0000   °[facility]   [address] °[place], [place] (336) 349-4454   °[facility] [address], [place], [place] (336) 342-8316 Insurance/Medicaid/sponsorship through [facility]  °[facility] [address]                                    [place], [place] (336) 342-8316 Therapy/tele-psych/case    °[facility] [address]  ° [place], [place] (336) 349-2233    °Dr. Arfeen  (336) 349-4544   °[facility] [place]  [employer] [place] [facility] 1) [address],  °2) [address], [place] °3)  [address], [place] (336) 349-3220 °(336) 342-7768 ° °(336) 342-8140   °[place] [facility] (336) 342-1394 or (336) 342-3537 (After Hours)    ° ° °

## 2015-03-29 NOTE — ED Provider Notes (Signed)
CSN: [id number]     Arrival date & time 03/29/15  1900 History   First MD Initiated Contact with Patient 03/29/15 2019     Chief Complaint  Patient presents with  . Otalgia  . Headache     (Consider location/radiation/quality/duration/timing/severity/associated sxs/prior Treatment) Patient is a 54 y.o. female presenting with tooth pain.  Dental Pain Location:  Upper and lower Quality:  Dull and pulsating Severity:  Mild Onset quality:  Gradual Duration:  2 days Timing:  Constant Progression:  Worsening Chronicity:  New Relieved by:  None tried Worsened by:  Nothing tried Ineffective treatments:  None tried Associated symptoms: facial pain, facial swelling and headaches   Associated symptoms: no difficulty swallowing and no drooling     Past Medical History  Diagnosis Date  . Hypertension   . Breast mass 02/2008     Noted on mammogram March 11, 2008 3 cm simple cyst at 9 to 10:00 position of the right breast as well as multiple other smaller simple cyst, 2.6 cm mass at the 10:00 position of the right breast also representing complex cyst,  . 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 Fibroid uterus  2001     status post total abdominal hysterectomy, right and left salpingo-oophorectomy , done by Dr. [name]  . Anemia 2009     Baseline hemoglobin at 11, secondary to iron deficiency  . Deep vein thrombosis (DVT)      history of multiple  DVTs in 1989, 1989, 2004, on chronic anticoagulation with Coumadin  . PE (pulmonary embolism)      history of multiple PEs in 1984, 1989, 2004-  electronic data reviewed in [place] and EMR and I was not able to find single CT angiogram that was positive for pulmonary embolus nor was I able to find Doppler studies that  were positive for DVTs  . Diverticulosis     noted colonoscopy 2010  . Eczema    Past Surgical History  Procedure Laterality Date  . Abdominal hysterectomy  04/13/2010    done by Dr. [name]  . Mastectomy, partial  03/2006     left partial mastectomy secondary to fibrocystic disease intraluminal fibroadenoma performed March 28, 2006 done by Dr. [name]  . Cholecystectomy      1997   Family History  Problem Relation Age of Onset  . Stroke Neg Hx   . Cancer    . Clotting disorder    . Gout Father    History  Substance Use Topics  . Smoking status: Never Smoker   . Smokeless tobacco: Not on file  . Alcohol Use: No   OB History    No data available     Review of Systems  Constitutional: Negative for chills and fatigue.  HENT: Positive for facial swelling. Negative for drooling, postnasal drip, rhinorrhea, sinus pressure, sore throat, trouble swallowing and voice change.   Eyes: Positive for pain and visual disturbance. Negative for photophobia and discharge.  Gastrointestinal: Negative for nausea, vomiting, abdominal pain and diarrhea.  Neurological: Positive for headaches. Negative for dizziness, tremors, syncope, facial asymmetry and speech difficulty.      Allergies  Dilaudid  Home Medications   Prior to Admission medications   Medication Sig Start Date End Date Taking? Authorizing Provider  amLODipine (NORVASC) 10 MG  tablet Take 1 tablet (10 mg total) by mouth daily. 08/23/14  Yes [name], MD  acetaminophen-codeine (TYLENOL #3) 300-30 MG per tablet Take 1 tablet by mouth every 8 (eight) hours as needed for moderate pain. 11/08/14   [name], MD  albuterol (PROVENTIL HFA;VENTOLIN HFA) 108 (90 BASE) MCG/ACT inhaler Inhale 1-2 puffs into the lungs every 6 (six) hours as needed for wheezing. Patient not taking: Reported on 09/27/2014 09/22/14   [name], MD  famotidine (PEPCID) 20 MG tablet Take 1 tablet (20 mg total) by mouth 2 (two) times daily as needed  for heartburn or indigestion. Patient not taking: Reported on 09/21/2014 01/06/14   [name], MD   BP 141/89 mmHg  Pulse 82  Temp(Src) 97.8 F (36.6 C) (Oral)  Resp 18  SpO2 100% Physical Exam  Constitutional: She is oriented to person, place, and time. She appears well-developed and well-nourished.  HENT:  Head: Normocephalic and atraumatic.  Eyes: Conjunctivae and EOM are normal. Right eye exhibits no discharge. Left eye exhibits no discharge.  Pressure: 16 right 15 left  Cardiovascular: Normal rate and regular rhythm.   Pulmonary/Chest: Effort normal and breath sounds normal. No respiratory distress.  Abdominal: Soft. She exhibits no distension. There is no tenderness. There is no rebound.  Musculoskeletal: Normal range of motion. She exhibits no edema or tenderness.  Neurological: She is alert and oriented to person, place, and time.  No altered mental status, able to give full seemingly accurate history.  Face is symmetric, EOM's intact, pupils equal and reactive, vision intact, tongue and uvula midline without deviation Upper and Lower extremity motor 5/5, intact pain perception in distal extremities, 2+ reflexes in biceps, patella and achilles tendons. Walks without assistance or evident ataxia.   Skin: Skin is warm and dry.  Nursing note and vitals reviewed.   ED Course  Procedures (including critical care time) Labs Review Labs Reviewed - No data to display  Imaging Review No results found.   EKG Interpretation None      MDM   Final diagnoses:  Dental infection  Cerumen impaction, right    54 yo F w/ right sided face pain, likely 2/2 exposed molar that is also infected. This is likely cause of eye pain (no increased pressure, persistent vision changes, proptosis or other abnormalities) and right sided headache/face pain as well. Started on abx, short course of pain meds. Will fu with dentist.  Has decreased hearing in her right ear but has cerumen impaction.  Will try drops at home and follow up with ENT if not improving in a few days.   I have personally and contemperaneously reviewed labs and imaging and used in my decision making as above.   A medical screening exam was performed and I feel the patient has had an appropriate workup for their chief complaint at this time and likelihood of emergent condition existing is low. They have been counseled on decision, discharge, follow up and which symptoms necessitate immediate return to the emergency department. They or their family verbally stated understanding and agreement with plan and discharged in stable condition.    [name], MD 03/30/15 [phone]

## 2015-03-29 NOTE — ED Notes (Addendum)
Pt c/o headache onset 3 days ago, followed by ear pain and ringing, followed by dental, right facial, and right eye pain. States "my pupil hurts." No pupillary reflex pain, no vision changes. Red reflex present. Denies pain due to ocular movement.

## 2015-03-29 NOTE — ED Notes (Signed)
MD Mesner at bedside  °

## 2015-03-29 NOTE — ED Notes (Signed)
MD at bedside. 

## 2015-07-05 ENCOUNTER — Encounter: Payer: Self-pay | Admitting: Student

## 2015-07-21 ENCOUNTER — Encounter: Payer: Self-pay | Admitting: Internal Medicine

## 2015-08-02 ENCOUNTER — Encounter: Payer: Self-pay | Admitting: Internal Medicine

## 2015-08-02 ENCOUNTER — Ambulatory Visit: Payer: 59 | Admitting: Internal Medicine

## 2015-08-10 ENCOUNTER — Emergency Department (HOSPITAL_COMMUNITY)
Admission: EM | Admit: 2015-08-10 | Discharge: 2015-08-10 | Disposition: A | Discharge status: Home / Self Care | Payer: 59 | Attending: Emergency Medicine | Admitting: Emergency Medicine

## 2015-08-10 ENCOUNTER — Encounter (HOSPITAL_COMMUNITY): Payer: Self-pay | Admitting: *Deleted

## 2015-08-10 DIAGNOSIS — M79604 Pain in right leg: Secondary | ICD-10-CM | POA: Insufficient documentation

## 2015-08-10 DIAGNOSIS — D649 Anemia, unspecified: Secondary | ICD-10-CM | POA: Insufficient documentation

## 2015-08-10 DIAGNOSIS — M25572 Pain in left ankle and joints of left foot: Secondary | ICD-10-CM | POA: Insufficient documentation

## 2015-08-10 DIAGNOSIS — Z872 Personal history of diseases of the skin and subcutaneous tissue: Secondary | ICD-10-CM | POA: Insufficient documentation

## 2015-08-10 DIAGNOSIS — M25571 Pain in right ankle and joints of right foot: Secondary | ICD-10-CM | POA: Insufficient documentation

## 2015-08-10 DIAGNOSIS — M79661 Pain in right lower leg: Secondary | ICD-10-CM | POA: Insufficient documentation

## 2015-08-10 DIAGNOSIS — M545 Low back pain: Secondary | ICD-10-CM | POA: Insufficient documentation

## 2015-08-10 DIAGNOSIS — M25512 Pain in left shoulder: Secondary | ICD-10-CM | POA: Insufficient documentation

## 2015-08-10 DIAGNOSIS — Z86718 Personal history of other venous thrombosis and embolism: Secondary | ICD-10-CM | POA: Insufficient documentation

## 2015-08-10 DIAGNOSIS — Z8719 Personal history of other diseases of the digestive system: Secondary | ICD-10-CM | POA: Insufficient documentation

## 2015-08-10 DIAGNOSIS — M79662 Pain in left lower leg: Secondary | ICD-10-CM | POA: Insufficient documentation

## 2015-08-10 DIAGNOSIS — Z79899 Other long term (current) drug therapy: Secondary | ICD-10-CM | POA: Insufficient documentation

## 2015-08-10 DIAGNOSIS — Z86711 Personal history of pulmonary embolism: Secondary | ICD-10-CM | POA: Insufficient documentation

## 2015-08-10 DIAGNOSIS — Z8742 Personal history of other diseases of the female genital tract: Secondary | ICD-10-CM | POA: Insufficient documentation

## 2015-08-10 DIAGNOSIS — R51 Headache: Secondary | ICD-10-CM | POA: Insufficient documentation

## 2015-08-10 DIAGNOSIS — M25511 Pain in right shoulder: Secondary | ICD-10-CM | POA: Insufficient documentation

## 2015-08-10 DIAGNOSIS — M79605 Pain in left leg: Secondary | ICD-10-CM | POA: Insufficient documentation

## 2015-08-10 DIAGNOSIS — M2550 Pain in unspecified joint: Secondary | ICD-10-CM

## 2015-08-10 DIAGNOSIS — Z86018 Personal history of other benign neoplasm: Secondary | ICD-10-CM | POA: Insufficient documentation

## 2015-08-10 DIAGNOSIS — I10 Essential (primary) hypertension: Secondary | ICD-10-CM | POA: Insufficient documentation

## 2015-08-10 DIAGNOSIS — M546 Pain in thoracic spine: Secondary | ICD-10-CM | POA: Insufficient documentation

## 2015-08-10 DIAGNOSIS — R002 Palpitations: Secondary | ICD-10-CM | POA: Insufficient documentation

## 2015-08-10 LAB — CBC WITH DIFFERENTIAL/PLATELET
Basophils Absolute: 0 10*3/uL (ref 0.0–0.1)
Basophils Relative: 0 %
Eosinophils Absolute: 0.1 10*3/uL (ref 0.0–0.7)
Eosinophils Relative: 1 %
HCT: 34.9 % — ABNORMAL LOW (ref 36.0–46.0)
Hemoglobin: 11.5 g/dL — ABNORMAL LOW (ref 12.0–15.0)
Lymphocytes Relative: 54 %
Lymphs Abs: 3 10*3/uL (ref 0.7–4.0)
MCH: 28.4 pg (ref 26.0–34.0)
MCHC: 33 g/dL (ref 30.0–36.0)
MCV: 86.2 fL (ref 78.0–100.0)
Monocytes Absolute: 0.4 10*3/uL (ref 0.1–1.0)
Monocytes Relative: 7 %
Neutro Abs: 2.1 10*3/uL (ref 1.7–7.7)
Neutrophils Relative %: 38 %
Platelets: 274 10*3/uL (ref 150–400)
RBC: 4.05 MIL/uL (ref 3.87–5.11)
RDW: 13.8 % (ref 11.5–15.5)
WBC: 5.6 10*3/uL (ref 4.0–10.5)

## 2015-08-10 LAB — BASIC METABOLIC PANEL
Anion gap: 10 (ref 5–15)
BUN: 13 mg/dL (ref 6–20)
CO2: 26 mmol/L (ref 22–32)
Calcium: 9.3 mg/dL (ref 8.9–10.3)
Chloride: 106 mmol/L (ref 101–111)
Creatinine, Ser: 0.91 mg/dL (ref 0.44–1.00)
GFR calc Af Amer: >60 mL/min (ref >60)
GFR calc non Af Amer: >60 mL/min (ref >60)
Glucose, Bld: 98 mg/dL (ref 65–99)
Potassium: 3.7 mmol/L (ref 3.5–5.1)
Sodium: 142 mmol/L (ref 135–145)

## 2015-08-10 LAB — SEDIMENTATION RATE: Sed Rate: 45 mm/hr — ABNORMAL HIGH (ref 0–22)

## 2015-08-10 LAB — URIC ACID: Uric Acid, Serum: 5.8 mg/dL (ref 2.3–6.6)

## 2015-08-10 MED ORDER — DEXAMETHASONE SODIUM PHOSPHATE 10 MG/ML IJ SOLN
10.0000 mg | Freq: Once | INTRAMUSCULAR | Status: AC
  Administered 2015-08-10: 10 mg via INTRAMUSCULAR
  Filled 2015-08-10: qty 1

## 2015-08-10 MED ORDER — PREDNISONE 20 MG PO TABS: ORAL_TABLET | ORAL | Status: DC

## 2015-08-10 MED ORDER — DIAZEPAM 5 MG/ML IJ SOLN
5.0000 mg | Freq: Once | INTRAMUSCULAR | Status: AC
  Administered 2015-08-10: 5 mg via INTRAMUSCULAR
  Filled 2015-08-10: qty 2

## 2015-08-10 MED ORDER — CYCLOBENZAPRINE HCL 5 MG PO TABS: 5.0000 mg | ORAL_TABLET | Freq: Three times a day (TID) | ORAL | Status: DC | PRN

## 2015-08-10 NOTE — Discharge Instructions (Signed)
Use heat for your pain (soak in a tub of warm water or in the shower). Take the medications as prescribed. Call your doctors office today, [phone] to let them know about your ED visit and to have them recheck you this week.    Heat Therapy Heat therapy can help ease sore, stiff, injured, and tight muscles and joints. Heat relaxes your muscles, which may help ease your pain.  RISKS AND COMPLICATIONS If you have any of the following conditions, do not use heat therapy unless your health care provider has approved:  Poor circulation.  Healing wounds or scarred skin in the area being treated.  Diabetes, heart disease, or high blood pressure.  Not being able to feel (numbness) the area being treated.  Unusual swelling of the area being treated.  Active infections.  Blood clots.  Cancer.  Inability to communicate pain. This may include young children and people who have problems with their brain function (dementia).  Pregnancy. Heat therapy should only be used on old, pre-existing, or long-lasting (chronic) injuries. Do not use heat therapy on new injuries unless directed by your health care provider. HOW TO USE HEAT THERAPY There are several different kinds of heat therapy, including:  Moist heat pack.  Warm water bath.  Hot water bottle.  Electric heating pad.  Heated gel pack.  Heated wrap.  Electric heating pad. Use the heat therapy method suggested by your health care provider. Follow your health care provider's instructions on when and how to use heat therapy. GENERAL HEAT THERAPY RECOMMENDATIONS  Do not sleep while using heat therapy. Only use heat therapy while you are awake.  Your skin may turn pink while using heat therapy. Do not use heat therapy if your skin turns red.  Do not use heat therapy if you have new pain.  High heat or long exposure to heat can cause burns. Be careful when using heat therapy to avoid burning your skin.  Do not use heat  therapy on areas of your skin that are already irritated, such as with a rash or sunburn. SEEK MEDICAL CARE IF:  You have blisters, redness, swelling, or numbness.  You have new pain.  Your pain is worse. MAKE SURE YOU:  Understand these instructions.  Will watch your condition.  Will get help right away if you are not doing well or get worse.   This information is not intended to replace advice given to you by your health care provider. Make sure you discuss any questions you have with your health care provider.   Document Released: 10/29/2011 Document Revised: 08/27/2014 Document Reviewed: 09/29/2013 Elsevier Interactive Patient Education 2016 Elsevier Inc.  Joint Pain Joint pain can be caused by many things. The joint can be bruised, infected, weak from aging, or sore from exercise. The pain will probably go away if you follow your doctor's instructions for home care. If your joint pain continues, more tests may be needed to help find the cause of your condition. HOME CARE Watch your condition for any changes. Follow these instructions as told to lessen the pain that you are feeling:  Take medicines only as told by your doctor.  Rest the sore joint for as long as told by your doctor. If your doctor tells you to, raise (elevate) the painful joint above the level of your heart while you are sitting or lying down.  Do not do things that cause pain or make the pain worse.  If told, put ice on the painful area:  Put ice in a plastic bag.  Place a towel between your skin and the bag.  Leave the ice on for 20 minutes, 2-3 times per day.  Wear an elastic bandage, splint, or sling as told by your doctor. Loosen the bandage or splint if your fingers or toes lose feeling (become numb) and tingle, or if they turn cold and blue.  Begin exercising or stretching the joint as told by your doctor. Ask your doctor what types of exercise are safe for you.  Keep all follow-up visits as  told by your doctor. This is important. GET HELP IF:  Your pain gets worse and medicine does not help it.  Your joint pain does not get better in 3 days.  You have more bruising or swelling.  You have a fever.  You lose 10 pounds (4.5 kg) or more without trying. GET HELP RIGHT AWAY IF:  You are not able to move the joint.  Your fingers or toes become numb or they turn cold and blue.   This information is not intended to replace advice given to you by your health care provider. Make sure you discuss any questions you have with your health care provider.   Document Released: 07/25/2009 Document Revised: 08/27/2014 Document Reviewed: 05/18/2014 Elsevier Interactive Patient Education [employer].

## 2015-08-10 NOTE — ED Notes (Signed)
Pt states that she is having generalized body pain; pt states that she has hx of arthritis but that this is nothing like that pain; pt states that it feels like her body is burning and has shooting pains; pt states that she always has some degree of pain but that the pain got worse yesterday; when pt asked what her pain level is on a scale of 0-10 pt states that the pain is a 25; pt denies injury

## 2015-08-10 NOTE — ED Provider Notes (Addendum)
CSN: [id number]     Arrival date & time 08/10/15  0106 History  By signing my name below, I, [name], attest that this documentation has been prepared under the direction and in the presence of [name], MD at [phone]. Electronically Signed: [name], ED Scribe. 08/10/2015. 2:57 AM.    Chief Complaint  Patient presents with  . Generalized Body Aches   The history is provided by the patient. No language interpreter was used.   HPI Comments: [name] is a 54 y.o. female who presents to the Emergency Department complaining of gradually worsening severe constant generalized joint pain onset 4 days.  She explains that her symptoms started with pain in her legs, specifically her knees and the spread to her back and  now she has pain everywhere. She denies any fever. She denies calling her PCP for these symptoms and adds that she has not seen them in a while (March per her chart). She denies any change in activity or appetite. She also denies that she smokes. 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 She denies that weather makes the arthritis worse. She reports that she was dx with arthritis by a blood test at the [facility]. She states that she has been having joint pain intermittently for the past 3 years. She denies a family hx of arthritis but reports a hx of gout.   PCP [place]   Past Medical History  Diagnosis Date  . Hypertension   . Breast mass 02/2008     Noted on mammogram March 11, 2008 3 cm simple cyst at 9 to 10:00 position of the right breast as well as multiple other smaller simple cyst, 2.6 cm mass at the 10:00 position of the right breast also representing complex cyst,  . 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 Fibroid uterus  2001     status post total abdominal hysterectomy, right and left salpingo-oophorectomy , done by Dr. [name]  . Anemia 2009     Baseline hemoglobin at 11, secondary to iron deficiency  . Deep vein thrombosis (DVT) (HCC)      history of multiple  DVTs in 1989, 1989, 2004, on chronic anticoagulation with Coumadin  . PE (pulmonary embolism)      history of multiple PEs in 1984, 1989, 2004-  electronic data reviewed in [place] and EMR and I was not able to find single CT angiogram that was positive for pulmonary embolus nor was I able to find Doppler studies that were positive for DVTs  . Diverticulosis     noted colonoscopy 2010  . Eczema    Past Surgical History  Procedure Laterality Date  . Abdominal hysterectomy  04/13/2010    done by Dr. [name]  . Mastectomy, partial  03/2006     left partial mastectomy secondary to fibrocystic disease intraluminal fibroadenoma performed March 28, 2006 done by Dr. [name]  . Cholecystectomy      1997   Family History  Problem Relation Age of Onset  . Stroke Neg Hx   . Cancer    . Clotting disorder    . Gout Father    Social History  Substance Use Topics  . Smoking status: Never Smoker   .  Smokeless tobacco: None  . Alcohol Use: No   Employed as Presenter, broadcasting, works outside and inside  [employer] History    No data available     Review of Systems  Constitutional: Negative for fever.  Cardiovascular: Positive for palpitations.  Musculoskeletal: Positive for arthralgias. Negative for joint swelling.  Neurological: Positive for headaches.  All other systems reviewed and are negative.     Allergies  Dilaudid  Home Medications   Prior to Admission medications   Medication Sig Start Date End Date Taking? Authorizing Provider  acetaminophen-codeine (TYLENOL #3) 300-30 MG per tablet Take 1 tablet by mouth every 8 (eight) hours as needed for moderate pain. 11/08/14   [name], MD  albuterol (PROVENTIL HFA;VENTOLIN HFA) 108 (90 BASE) MCG/ACT inhaler Inhale 1-2 puffs into the lungs every 6 (six) hours as needed for wheezing. Patient not taking: Reported on 09/27/2014 09/22/14   [name], MD  amLODipine (NORVASC) 10 MG tablet Take 1 tablet (10 mg total) by mouth daily. 08/23/14   [name], MD  clindamycin (CLEOCIN) 300 MG capsule Take 1 capsule (300 mg total) by mouth 4 (four) times daily. X 7 days 03/29/15   [name], MD  cyclobenzaprine (FLEXERIL) 5 MG tablet Take 1 tablet (5 mg total) by mouth 3 (three) times daily as needed for muscle spasms. 08/10/15   [name], MD  famotidine (PEPCID) 20 MG tablet Take 1 tablet (20 mg total) by mouth 2 (two) times daily as needed for heartburn or indigestion. Patient not taking: Reported on 09/21/2014 01/06/14   [name], MD  oxyCODONE-acetaminophen (PERCOCET) 5-325 MG per tablet Take 1 tablet by mouth every 4 (four) hours as needed. 03/29/15   [name], MD  predniSONE (DELTASONE) 20 MG tablet Take 3 po QD x 3d , then 2 po QD x 3d then 1 po QD x 3d 08/10/15   [name], MD   BP 141/85 mmHg  Pulse 70  Temp(Src) 98.2 F (36.8 C) (Oral)  Resp 18  Ht 5\' 11"  (1.803 m)  Wt 250 lb (113.399 kg)  BMI 34.88 kg/m2  SpO2 100%  Vital signs normal   Physical Exam  Constitutional: She is oriented to person, place, and time. She appears well-developed and well-nourished.  Non-toxic appearance. She does not appear ill. No distress.  HENT:  Head: Normocephalic and atraumatic.  Right Ear: External ear normal.  Left Ear: External ear normal.  Nose: Nose normal. No mucosal edema or rhinorrhea.  Mouth/Throat: Oropharynx is clear and moist and mucous membranes are normal. No dental abscesses or uvula swelling.  Eyes: Conjunctivae and EOM are normal. Pupils are equal, round, and reactive to light.  Neck: Normal range of motion and full passive range of motion without pain. Neck supple.  Cardiovascular: Normal rate, regular  rhythm and normal heart sounds.  Exam reveals no gallop and no friction rub.   No murmur heard. Pulmonary/Chest: Effort normal and breath sounds normal. No respiratory distress. She has no wheezes. She has no rhonchi. She has no rales. She exhibits no tenderness and no crepitus.  Abdominal: Soft. Normal appearance and bowel sounds are normal. She exhibits no distension. There is no tenderness. There is no rebound and no guarding.  Musculoskeletal: Normal range of motion. She exhibits tenderness. She exhibits no edema.  Moves all extremities well.  Does not have any swollen joints but has pain in her ankles, knees, fingers, and shoulders, and diffusely in her thoracic and lumbar spine, and muscles  of the upper back, over the trapezius.   Neurological: She is alert and oriented to person, place, and time. She has normal strength. No cranial nerve deficit.  Skin: Skin is warm, dry and intact. No rash noted. No erythema. No pallor.  Psychiatric: She has a normal mood and affect. Her speech is normal and behavior is normal. Her mood appears not anxious.  Nursing note and vitals reviewed.   ED Course  Procedures (including critical care time) Medications  dexamethasone (DECADRON) injection 10 mg (10 mg Intramuscular Given 08/10/15 0322)  diazepam (VALIUM) injection 5 mg (5 mg Intramuscular Given 08/10/15 0321)    DIAGNOSTIC STUDIES: Oxygen Saturation is 100% on RA, normal by my interpretation.    COORDINATION OF CARE: 2:48 AM- Pt advised of plan for treatment and pt agrees. Pt will receive decadron and valium.   04:30 pt states her pain is better, not gone. We discussed follow up with her PCP this week.    Review of patient's prior testing shows she had a negative ANA 3 years and 6 years ago, her rheumatoid factor was negative 3 years and 4 years ago, her sedimentation rate was 25 three years ago, but was 48 years ago, her uric acid level was 7.7 two  years ago but was normal before that.    Labs Review Results for orders placed or performed during the hospital encounter of 08/10/15  Uric acid  Result Value Ref Range   Uric Acid, Serum 5.8 2.3 - 6.6 mg/dL  Sedimentation rate  Result Value Ref Range   Sed Rate 45 (H) 0 - 22 mm/hr  CBC with Differential/Platelet  Result Value Ref Range   WBC 5.6 4.0 - 10.5 K/uL   RBC 4.05 3.87 - 5.11 MIL/uL   Hemoglobin 11.5 (L) 12.0 - 15.0 g/dL   HCT 34.9 (L) 36.0 - 46.0 %   MCV 86.2 78.0 - 100.0 fL   MCH 28.4 26.0 - 34.0 pg   MCHC 33.0 30.0 - 36.0 g/dL   RDW 13.8 11.5 - 15.5 %   Platelets 274 150 - 400 K/uL   Neutrophils Relative % 38 %   Neutro Abs 2.1 1.7 - 7.7 K/uL   Lymphocytes Relative 54 %   Lymphs Abs 3.0 0.7 - 4.0 K/uL   Monocytes Relative 7 %   Monocytes Absolute 0.4 0.1 - 1.0 K/uL   Eosinophils Relative 1 %   Eosinophils Absolute 0.1 0.0 - 0.7 K/uL   Basophils Relative 0 %   Basophils Absolute 0.0 0.0 - 0.1 K/uL  Basic metabolic panel  Result Value Ref Range   Sodium 142 135 - 145 mmol/L   Potassium 3.7 3.5 - 5.1 mmol/L   Chloride 106 101 - 111 mmol/L   CO2 26 22 - 32 mmol/L   Glucose, Bld 98 65 - 99 mg/dL   BUN 13 6 - 20 mg/dL   Creatinine, Ser 0.91 0.44 - 1.00 mg/dL   Calcium 9.3 8.9 - 10.3 mg/dL   GFR calc non Af Amer >60 >60 mL/min   GFR calc Af Amer >60 >60 mL/min   Anion gap 10 5 - 15   Laboratory interpretation all normal except mild anemia, mildly elevated SED rate slightly higher than several years ago     Imaging Review No results found.   [name], MD has personally reviewed and evaluated these images and lab results as part of her medical decision-making.   MDM   she presents with diffuse body aches and joint aches  without acute swelling of any of her joints. She has had negative arthritis testing in the past. She has a mildly elevated sedimentation rate in the ED. She was given steroids and a muscle relaxer which improved her discomfort. She'll be discharged on prednisone and a muscle  relaxer and referred back to her PCP.    Final diagnoses:  Arthralgia   New Prescriptions   CYCLOBENZAPRINE (FLEXERIL) 5 MG TABLET    Take 1 tablet (5 mg total) by mouth 3 (three) times daily as needed for muscle spasms.   PREDNISONE (DELTASONE) 20 MG TABLET    Take 3 po QD x 3d , then 2 po QD x 3d then 1 po QD x 3d    Plan discharge  [name], MD, FACEP    I personally performed the services described in this documentation, which was scribed in my presence. The recorded information has been reviewed and considered.  [name], MD, Barbette Or, MD 08/10/15 [place], MD 08/10/15 [phone]

## 2015-08-17 ENCOUNTER — Other Ambulatory Visit: Payer: Self-pay | Admitting: *Deleted

## 2015-08-17 MED ORDER — AMLODIPINE BESYLATE 10 MG PO TABS: 10.0000 mg | ORAL_TABLET | Freq: Every day | ORAL | Status: DC

## 2016-01-02 ENCOUNTER — Ambulatory Visit: Payer: Self-pay | Admitting: Internal Medicine

## 2016-01-09 ENCOUNTER — Telehealth: Payer: Self-pay | Admitting: Internal Medicine

## 2016-01-09 NOTE — Telephone Encounter (Signed)
APT. REMINDER CALL, MAILBOX FULL

## 2016-01-10 ENCOUNTER — Ambulatory Visit (INDEPENDENT_AMBULATORY_CARE_PROVIDER_SITE_OTHER): Payer: BLUE CROSS/BLUE SHIELD | Admitting: Internal Medicine

## 2016-01-10 ENCOUNTER — Encounter: Payer: Self-pay | Admitting: Internal Medicine

## 2016-01-10 VITALS — BP 118/84 | HR 78 | Temp 98.3°F | Ht 71.0 in | Wt 288.0 lb

## 2016-01-10 DIAGNOSIS — M79671 Pain in right foot: Secondary | ICD-10-CM

## 2016-01-10 DIAGNOSIS — M7741 Metatarsalgia, right foot: Secondary | ICD-10-CM

## 2016-01-10 DIAGNOSIS — M79672 Pain in left foot: Secondary | ICD-10-CM

## 2016-01-10 DIAGNOSIS — M7742 Metatarsalgia, left foot: Principal | ICD-10-CM

## 2016-01-10 LAB — GLUCOSE, CAPILLARY: Glucose-Capillary: 94 mg/dL (ref 65–99)

## 2016-01-10 LAB — POCT GLYCOSYLATED HEMOGLOBIN (HGB A1C): Hemoglobin A1C: 6.1

## 2016-01-10 NOTE — Assessment & Plan Note (Addendum)
Overview For "some time," she reports bilateral foot pain, left greater than the right. The discomfort is primarily localized to the dorsum of her feet with some involvement of the plantar surface. She scores is 50/10 and describes it as "shocks." The pain is worse when she walks, especially when she's standing, and she does not find any relief from it at night when she is lying supine in bed. She has been prescribed cyclobenzaprine for this pain without adequate relief. 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 She otherwise denies calf tenderness, vegetarian diet, excessive alcohol intake [more than 3 drinks in the past month that once], relief of pain with warm showers, herbal supplements. She is wondering she is undergo evaluation for bariatric surgery to adequately relieve her pain.  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 Her symptoms do not fit a clear dermatomal pattern as would be expected with disc herniation, and she denies any radicular pain extending from her back down to the feet. She does have a history of other vague symptoms, like headaches, and muscle pains, which does raise suspicion for autoimmune diseases though again does not fit the distribution of her symptoms. Last A1c on file 5.9 on 04/15/2012 though expect marked hyperglycemia to result in neuropathic damage.  Plan -Check CBC, CMET, ESR, A1c today -Refer to podiatry for orthotic fitting as diagnostic workup ensues -Follow-up in 1-2 weeks to review bloodwork -Look into bariatric surgery evaluation in preparation for next visit.

## 2016-01-10 NOTE — Progress Notes (Signed)
   Subjective:    Patient ID: [name], female    DOB: [birth year], 55 y.o.   MRN: [id number]  HPI Ms. Dinnocenzo is a 55 year old female with hypertension, morbid obesity, history of PE who presents today for foot pain. Please see assessment & plan for status of chronic medical problems.    Review of Systems  Constitutional: Negative for appetite change and unexpected weight change.  Eyes: Positive for visual disturbance.  Musculoskeletal: Positive for arthralgias and gait problem.  Neurological: Positive for numbness and headaches.       Objective:   Physical Exam  Constitutional: She is oriented to person, place, and time. She appears well-developed and well-nourished.  HENT:  Head: Normocephalic and atraumatic.  Eyes: Conjunctivae are normal. No scleral icterus.  Cardiovascular: Normal rate and regular rhythm.   Pulmonary/Chest: Effort normal and breath sounds normal.  Neurological: She is alert and oriented to person, place, and time.  Diminished sensation to light touch, left greater than right up to the knees bilaterally. Diminished reflexes of the patellar and ankle joint. 4 out of 5 left lower extremity strength as compared to 5 out of 5 on the right difficult to tease out if this is in the setting of discomfort.          Assessment & Plan:

## 2016-01-10 NOTE — Patient Instructions (Addendum)
I think what might be going is metatarsalgia which is pain at the tips of the toes. There are several causes to this condition which I will look at before our next visit.  Please see me back next week to review results of bloodwork.

## 2016-01-11 LAB — CBC WITH DIFFERENTIAL/PLATELET
Basophils Absolute: 0 10*3/uL (ref 0.0–0.2)
Basos: 0 %
EOS (ABSOLUTE): 0.1 10*3/uL (ref 0.0–0.4)
Eos: 1 %
Hematocrit: 39.8 % (ref 34.0–46.6)
Hemoglobin: 13.3 g/dL (ref 11.1–15.9)
Immature Grans (Abs): 0 10*3/uL (ref 0.0–0.1)
Immature Granulocytes: 0 %
Lymphocytes Absolute: 3 10*3/uL (ref 0.7–3.1)
Lymphs: 46 %
MCH: 28.4 pg (ref 26.6–33.0)
MCHC: 33.4 g/dL (ref 31.5–35.7)
MCV: 85 fL (ref 79–97)
Monocytes Absolute: 0.4 10*3/uL (ref 0.1–0.9)
Monocytes: 7 %
Neutrophils Absolute: 3 10*3/uL (ref 1.4–7.0)
Neutrophils: 46 %
Platelets: 353 10*3/uL (ref 150–379)
RBC: 4.69 x10E6/uL (ref 3.77–5.28)
RDW: 14.7 % (ref 12.3–15.4)
WBC: 6.5 10*3/uL (ref 3.4–10.8)

## 2016-01-11 LAB — CMP14 + ANION GAP
ALT: 55 IU/L — ABNORMAL HIGH (ref 0–32)
AST: 35 IU/L (ref 0–40)
Albumin/Globulin Ratio: 1.5 (ref 1.2–2.2)
Albumin: 4.4 g/dL (ref 3.5–5.5)
Alkaline Phosphatase: 88 IU/L (ref 39–117)
Anion Gap: 16 mmol/L (ref 10.0–18.0)
BUN/Creatinine Ratio: 11 (ref 9–23)
BUN: 10 mg/dL (ref 6–24)
Bilirubin Total: 0.5 mg/dL (ref 0.0–1.2)
CO2: 24 mmol/L (ref 18–29)
Calcium: 9.7 mg/dL (ref 8.7–10.2)
Chloride: 99 mmol/L (ref 96–106)
Creatinine, Ser: 0.92 mg/dL (ref 0.57–1.00)
GFR calc Af Amer: 80 mL/min/{1.73_m2} (ref >59)
GFR calc non Af Amer: 69 mL/min/{1.73_m2} (ref >59)
Globulin, Total: 2.9 g/dL (ref 1.5–4.5)
Glucose: 90 mg/dL (ref 65–99)
Potassium: 4.6 mmol/L (ref 3.5–5.2)
Sodium: 139 mmol/L (ref 134–144)
Total Protein: 7.3 g/dL (ref 6.0–8.5)

## 2016-01-11 LAB — SEDIMENTATION RATE: Sed Rate: 33 mm/hr (ref 0–40)

## 2016-01-11 NOTE — Progress Notes (Signed)
Case discussed with Dr. Patel at the time of the visit.  We reviewed the resident's history and exam and pertinent patient test results.  I agree with the assessment, diagnosis, and plan of care documented in the resident's note. 

## 2016-01-17 ENCOUNTER — Telehealth: Payer: Self-pay | Admitting: Internal Medicine

## 2016-01-17 NOTE — Telephone Encounter (Signed)
  Reason for call:   I placed an outgoing call to Ms. Dwyane Dee around [phone]  AM regarding bariatric surgery. She did not pick up the phone, so I asked her to give [place] a call back to review the following.   Assessment/ Plan:   Her labwork was reassuring from last week.  For bariatric surgery, she would qualify by weight alone though I will need to go through long-term complications with her.    Riccardo Dubin, MD   01/17/2016, 11:55 AM

## 2016-01-20 ENCOUNTER — Encounter: Payer: BLUE CROSS/BLUE SHIELD | Admitting: Internal Medicine

## 2016-01-20 ENCOUNTER — Encounter: Payer: Self-pay | Admitting: Internal Medicine

## 2016-02-07 ENCOUNTER — Encounter: Payer: Self-pay | Admitting: Internal Medicine

## 2016-04-03 ENCOUNTER — Ambulatory Visit: Payer: Self-pay

## 2016-04-03 ENCOUNTER — Encounter: Payer: BLUE CROSS/BLUE SHIELD | Admitting: Podiatry

## 2016-04-03 DIAGNOSIS — M79673 Pain in unspecified foot: Secondary | ICD-10-CM

## 2016-04-13 ENCOUNTER — Encounter: Payer: Self-pay | Admitting: Podiatry

## 2016-04-13 ENCOUNTER — Ambulatory Visit (INDEPENDENT_AMBULATORY_CARE_PROVIDER_SITE_OTHER): Payer: BLUE CROSS/BLUE SHIELD | Admitting: Podiatry

## 2016-04-13 ENCOUNTER — Ambulatory Visit (INDEPENDENT_AMBULATORY_CARE_PROVIDER_SITE_OTHER): Payer: BLUE CROSS/BLUE SHIELD

## 2016-04-13 DIAGNOSIS — M79672 Pain in left foot: Secondary | ICD-10-CM

## 2016-04-13 DIAGNOSIS — M79671 Pain in right foot: Secondary | ICD-10-CM

## 2016-04-13 DIAGNOSIS — M779 Enthesopathy, unspecified: Secondary | ICD-10-CM | POA: Diagnosis not present

## 2016-04-13 DIAGNOSIS — G629 Polyneuropathy, unspecified: Secondary | ICD-10-CM | POA: Diagnosis not present

## 2016-04-13 MED ORDER — PREDNISONE 10 MG PO TABS: ORAL_TABLET | ORAL | 0 refills | Status: DC

## 2016-04-13 MED ORDER — TRIAMCINOLONE ACETONIDE 10 MG/ML IJ SUSP
10.0000 mg | Freq: Once | INTRAMUSCULAR | Status: AC
  Administered 2016-04-13: 10 mg

## 2016-04-13 NOTE — Progress Notes (Signed)
Subjective:     Patient ID: [name], female   DOB: Oct 17, 1960, 55 y.o.   MRN: [id number]  HPI patient presents with pain in both feet that is occurring in the entire feet and also the ankles. Patient states it's been present for over a year and that she does work and it's hard for her to bear weight all day due to the pain   Review of Systems  All other systems reviewed and are negative.      Objective:   Physical Exam  Constitutional: She is oriented to person, place, and time.  Cardiovascular: Intact distal pulses.   Musculoskeletal: Normal range of motion.  Neurological: She is oriented to person, place, and time.  Skin: Skin is warm.  Nursing note and vitals reviewed.  neurovascular status intact muscle strength adequate with patient noted to have discomfort around the sinus tarsi bilateral with inflammation and also mild to moderate forefoot pain upon movement. There is no increased edema in the ankle region bilateral and negative Homans sign was noted with patient found to be well oriented 3 and good digital perfusion     Assessment:     Difficult to say whether this may be systemic or localized inflammation and also cannot rule out possibilities for neuropathy    Plan:     H&P x-rays reviewed and injected the sinus tarsi bilateral 3 mg Kenalog 5 mg Xylocaine and applied fascial brace is to lift the arch and placed on 12 day Sterapred DS Dosepak. Also went ahead today and sent for evaluation of metabolic panel and arthritic panel and we'll review when she returns in the next 2 weeks  X-ray report was negative for signs of ostial lytic changes with moderate depression of the arch and spurring of the midtarsal joint

## 2016-04-13 NOTE — Progress Notes (Signed)
   Subjective:    Patient ID: [name], female    DOB: 1961/02/16, 55 y.o.   MRN: [id number]  HPI  Chief Complaint  Patient presents with  . Foot Pain    bilateral ... pt states "they feel like they are on fire, my entire foot x over 1 yr."    . Toe Pain    burning       Review of Systems  HENT: Positive for tinnitus.   Cardiovascular: Positive for palpitations and leg swelling.       Calf pain  Musculoskeletal: Positive for arthralgias, gait problem and myalgias.  Hematological: Bruises/bleeds easily.  All other systems reviewed and are negative.      Objective:   Physical Exam        Assessment & Plan:

## 2016-04-17 LAB — COMPREHENSIVE METABOLIC PANEL
ALT: 26 U/L (ref 6–29)
AST: 18 U/L (ref 10–35)
Albumin: 4.3 g/dL (ref 3.6–5.1)
Alkaline Phosphatase: 80 U/L (ref 33–130)
BUN: 10 mg/dL (ref 7–25)
CO2: 23 mmol/L (ref 20–31)
Calcium: 9.3 mg/dL (ref 8.6–10.4)
Chloride: 104 mmol/L (ref 98–110)
Creat: 0.96 mg/dL (ref 0.50–1.05)
Glucose, Bld: 92 mg/dL (ref 65–99)
Potassium: 3.8 mmol/L (ref 3.5–5.3)
Sodium: 140 mmol/L (ref 135–146)
Total Bilirubin: 0.4 mg/dL (ref 0.2–1.2)
Total Protein: 7.4 g/dL (ref 6.1–8.1)

## 2016-04-17 LAB — URIC ACID: Uric Acid, Serum: 5.5 mg/dL (ref 2.5–7.0)

## 2016-04-17 LAB — C-REACTIVE PROTEIN: CRP: 0.6 mg/dL — ABNORMAL HIGH (ref <0.60)

## 2016-04-17 LAB — SEDIMENTATION RATE: Sed Rate: 24 mm/hr (ref 0–30)

## 2016-04-17 LAB — ANA, IFA COMPREHENSIVE PANEL
Anti Nuclear Antibody(ANA): NEGATIVE
ENA SM Ab Ser-aCnc: <1
SM/RNP: <1
SSA (Ro) (ENA) Antibody, IgG: <1
SSB (La) (ENA) Antibody, IgG: <1
Scleroderma (Scl-70) (ENA) Antibody, IgG: <1
ds DNA Ab: <1 IU/mL

## 2016-04-17 LAB — RHEUMATOID FACTOR: Rhuematoid fact SerPl-aCnc: <10 IU/mL (ref <=14)

## 2016-05-03 ENCOUNTER — Ambulatory Visit: Payer: BLUE CROSS/BLUE SHIELD | Admitting: Podiatry

## 2016-05-09 ENCOUNTER — Ambulatory Visit: Payer: BLUE CROSS/BLUE SHIELD | Admitting: Podiatry

## 2016-05-09 ENCOUNTER — Emergency Department (HOSPITAL_COMMUNITY)
Admission: EM | Admit: 2016-05-09 | Discharge: 2016-05-09 | Disposition: A | Discharge status: Home / Self Care | Payer: BLUE CROSS/BLUE SHIELD | Attending: Emergency Medicine | Admitting: Emergency Medicine

## 2016-05-09 ENCOUNTER — Encounter (HOSPITAL_COMMUNITY): Payer: Self-pay | Admitting: Emergency Medicine

## 2016-05-09 ENCOUNTER — Emergency Department (HOSPITAL_COMMUNITY): Payer: BLUE CROSS/BLUE SHIELD

## 2016-05-09 DIAGNOSIS — I10 Essential (primary) hypertension: Secondary | ICD-10-CM | POA: Insufficient documentation

## 2016-05-09 DIAGNOSIS — R51 Headache: Secondary | ICD-10-CM | POA: Diagnosis not present

## 2016-05-09 DIAGNOSIS — R519 Headache, unspecified: Secondary | ICD-10-CM

## 2016-05-09 DIAGNOSIS — R531 Weakness: Secondary | ICD-10-CM | POA: Diagnosis present

## 2016-05-09 LAB — COMPREHENSIVE METABOLIC PANEL
ALT: 88 U/L — ABNORMAL HIGH (ref 14–54)
AST: 40 U/L (ref 15–41)
Albumin: 3.8 g/dL (ref 3.5–5.0)
Alkaline Phosphatase: 78 U/L (ref 38–126)
Anion gap: 10 (ref 5–15)
BUN: 8 mg/dL (ref 6–20)
CO2: 22 mmol/L (ref 22–32)
Calcium: 10 mg/dL (ref 8.9–10.3)
Chloride: 107 mmol/L (ref 101–111)
Creatinine, Ser: 1.17 mg/dL — ABNORMAL HIGH (ref 0.44–1.00)
GFR calc Af Amer: 59 mL/min — ABNORMAL LOW (ref >60)
GFR calc non Af Amer: 51 mL/min — ABNORMAL LOW (ref >60)
Glucose, Bld: 145 mg/dL — ABNORMAL HIGH (ref 65–99)
Potassium: 3.1 mmol/L — ABNORMAL LOW (ref 3.5–5.1)
Sodium: 139 mmol/L (ref 135–145)
Total Bilirubin: 0.9 mg/dL (ref 0.3–1.2)
Total Protein: 7.3 g/dL (ref 6.5–8.1)

## 2016-05-09 LAB — CBC WITH DIFFERENTIAL/PLATELET
Basophils Absolute: 0 10*3/uL (ref 0.0–0.1)
Basophils Relative: 0 %
Eosinophils Absolute: 0.1 10*3/uL (ref 0.0–0.7)
Eosinophils Relative: 1 %
HCT: 39.8 % (ref 36.0–46.0)
Hemoglobin: 13.2 g/dL (ref 12.0–15.0)
Lymphocytes Relative: 53 %
Lymphs Abs: 4 10*3/uL (ref 0.7–4.0)
MCH: 28.3 pg (ref 26.0–34.0)
MCHC: 33.2 g/dL (ref 30.0–36.0)
MCV: 85.2 fL (ref 78.0–100.0)
Monocytes Absolute: 0.5 10*3/uL (ref 0.1–1.0)
Monocytes Relative: 7 %
Neutro Abs: 2.9 10*3/uL (ref 1.7–7.7)
Neutrophils Relative %: 39 %
Platelets: 307 10*3/uL (ref 150–400)
RBC: 4.67 MIL/uL (ref 3.87–5.11)
RDW: 13.8 % (ref 11.5–15.5)
WBC: 7.4 10*3/uL (ref 4.0–10.5)

## 2016-05-09 MED ORDER — DIPHENHYDRAMINE HCL 50 MG/ML IJ SOLN
50.0000 mg | Freq: Once | INTRAMUSCULAR | Status: AC
  Administered 2016-05-09: 50 mg via INTRAVENOUS
  Filled 2016-05-09: qty 1

## 2016-05-09 MED ORDER — LORAZEPAM 2 MG/ML IJ SOLN
0.5000 mg | Freq: Once | INTRAMUSCULAR | Status: AC
  Administered 2016-05-09: 0.5 mg via INTRAVENOUS
  Filled 2016-05-09: qty 1

## 2016-05-09 MED ORDER — SODIUM CHLORIDE 0.9 % IV BOLUS (SEPSIS)
1000.0000 mL | Freq: Once | INTRAVENOUS | Status: AC
  Administered 2016-05-09: 1000 mL via INTRAVENOUS

## 2016-05-09 MED ORDER — PROCHLORPERAZINE EDISYLATE 5 MG/ML IJ SOLN
10.0000 mg | Freq: Once | INTRAMUSCULAR | Status: AC
  Administered 2016-05-09: 10 mg via INTRAVENOUS
  Filled 2016-05-09: qty 2

## 2016-05-09 NOTE — ED Triage Notes (Signed)
Per [facility]: Patient to ED from [facility] with c/o generalized weakness and L sided H/A. EMS initially called out for the weakness and central CP/palpitations. EMS gave 324 ASA and 2 NTG, and 4mg  zofran. BP was 212/100, HR 110 - en route, BP 170/90, HR increased to 130-160 ST. Patient does appear to have non-traumatic head swelling, no signs of obvious injuries. Pt denies fall/trauma. Pt A&O x 4. C/o palpitations and H/A at this time.

## 2016-05-09 NOTE — ED Provider Notes (Signed)
[facility] DEPT Provider Note   CSN: [id number] Arrival date & time: 05/09/16  2056     History   Chief Complaint Chief Complaint  Patient presents with  . Weakness    HPI [name] is a 55 y.o. female.  55 yo F with a chief complaint of generalized weakness and headache. Patient has had left-sided headache going on just today. As the day progressed she started feeling like her heart was beating rapidly and she was having trouble breathing. She went to work and decided that she could no longer take her symptoms and dialed 911. She denies any history of the same. She is to be anticoagulated for multiple PEs and DVTs no longer takes the medicine for the past years.   The history is provided by the patient and the EMS personnel. The history is limited by the condition of the patient.  Weakness  Primary symptoms include no dizziness. This is a new problem. The current episode started 12 to 24 hours ago. The problem has not changed since onset.There was no focality noted. There has been no fever. Associated symptoms include headaches. Pertinent negatives include no shortness of breath, no chest pain and no vomiting.    Past Medical History:  Diagnosis Date  . Anemia 2009    Baseline hemoglobin at 11, secondary to iron deficiency  . Breast mass 02/2008    Noted on mammogram March 11, 2008 3 cm simple cyst at 9 to 10:00 position of the right breast as well as multiple other smaller simple cyst, 2.6 cm mass at the 10:00 position of the right breast also representing complex cyst,  . Deep vein thrombosis (DVT) (HCC)     history of multiple  DVTs in 1989, 1989, 2004, on chronic anticoagulation with Coumadin  . Diverticulosis    noted colonoscopy 2010  . Eczema   . Fibroid uterus  2001    status post total abdominal hysterectomy, right and left salpingo-oophorectomy , done by Dr. [name]  . Hypertension   . PE (pulmonary embolism)     history of multiple PEs in 1984, 1989,  2004-  electronic data reviewed in [place] and EMR and I was not able to find single CT angiogram that was positive for pulmonary embolus nor was I able to find Doppler studies that were positive for DVTs  . 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 Hyperlipidemia 01/27/2014  . Palpitations 12/30/2013  . Headache(784.0) 09/29/2013  . Bilateral knee pain 05/25/2013  . Metatarsalgia of both feet 05/25/2013  . Eczema 05/12/2013  . Insomnia 05/12/2013  . Diverticulosis 04/18/2012  . Ovarian cyst, left 04/16/2012  . Breast cyst 10/02/2011  . Arthritis 02/07/2011  . Essential hypertension 05/29/2006  . PULMONARY EMBOLISM, HX OF 05/29/2006    Past Surgical History:  Procedure Laterality Date  . ABDOMINAL HYSTERECTOMY  04/13/2010   done by Dr. [name]  . CHOLECYSTECTOMY     1997  . MASTECTOMY, PARTIAL  03/2006    left partial mastectomy secondary to fibrocystic disease intraluminal fibroadenoma performed March 28, 2006 done by Dr. Fernand Parkins History    No data available       Home Medications    Prior to Admission  medications   Medication Sig Start Date End Date Taking? Authorizing Provider  amLODipine (NORVASC) 10 MG tablet Take 1 tablet (10 mg total) by mouth daily. 08/17/15  Yes Annia Belt, MD  cyclobenzaprine (FLEXERIL) 5 MG tablet Take 1 tablet (5 mg total) by mouth 3 (three) times daily as needed for muscle spasms. 08/10/15  Yes [name], MD  predniSONE (DELTASONE) 10 MG tablet 12 day tapering dose Patient not taking: Reported on 05/09/2016 04/13/16   Wallene Huh, DPM    Family History Family History  Problem Relation Age of Onset  . Cancer    . Clotting disorder    .  Gout Father   . Stroke Neg Hx     Social History Social History  Substance Use Topics  . Smoking status: Never Smoker  . Smokeless tobacco: Never Used  . Alcohol use No     Allergies   Dilaudid [hydromorphone hcl]   Review of Systems Review of Systems  Constitutional: Negative for chills and fever.  HENT: Negative for congestion and rhinorrhea.   Eyes: Negative for redness and visual disturbance.  Respiratory: Negative for shortness of breath and wheezing.   Cardiovascular: Negative for chest pain and palpitations.  Gastrointestinal: Negative for nausea and vomiting.  Genitourinary: Negative for dysuria and urgency.  Musculoskeletal: Negative for arthralgias and myalgias.  Skin: Negative for pallor and wound.  Neurological: Positive for weakness, light-headedness and headaches. Negative for dizziness.     Physical Exam Updated Vital Signs BP 128/91   Pulse 94   Temp 99.2 F (37.3 C) (Oral)   Resp (!) 30   Ht 5\' 11"  (1.803 m)   Wt 250 lb (113.4 kg)   SpO2 95%   BMI 34.87 kg/m   Physical Exam  Constitutional: She is oriented to person, place, and time. She appears well-developed and well-nourished. No distress.  HENT:  Head: Normocephalic and atraumatic.  Loss of hair with some bogginess around the skull.  Eyes: EOM are normal. Pupils are equal, round, and reactive to light.  Neck: Normal range of motion. Neck supple.  Cardiovascular: Regular rhythm.  Tachycardia present.  Exam reveals no gallop and no friction rub.   No murmur heard. Pulmonary/Chest: Effort normal. She has no wheezes. She has no rales.  Abdominal: Soft. She exhibits no distension. There is no tenderness.  Musculoskeletal: She exhibits no edema or tenderness.  Neurological: She is alert and oriented to person, place, and time.  Skin: Skin is warm and dry. She is not diaphoretic.  Psychiatric: Her behavior is normal. Her mood appears anxious. Her speech is rapid and/or pressured.  Nursing note  and vitals reviewed.    ED Treatments / Results  Labs (all labs ordered are listed, but only abnormal results are displayed) Labs Reviewed  COMPREHENSIVE METABOLIC PANEL - Abnormal; Notable for the following:       Result Value   Potassium 3.1 (*)    Glucose, Bld 145 (*)    Creatinine, Ser 1.17 (*)    ALT 88 (*)    GFR calc non Af Amer 51 (*)    GFR calc Af Amer 59 (*)    All other components within normal limits  CBC WITH DIFFERENTIAL/PLATELET    EKG  EKG Interpretation  Date/Time:  Wednesday May 09 2016 21:06:50 EDT Ventricular Rate:  122 PR Interval:    QRS Duration: 89 QT Interval:  339 QTC Calculation: 483 R Axis:   63 Text Interpretation:  Sinus tachycardia ST changes likely  rate related Otherwise no significant change Confirmed by Natsumi Whitsitt MD, DANIEL [phone]) on 05/09/2016 9:32:27 PM       Radiology Ct Head Wo Contrast  Result Date: 05/09/2016 CLINICAL DATA:  LEFT-sided headache, nontraumatic swelling and weakness. EXAM: CT HEAD WITHOUT CONTRAST TECHNIQUE: Contiguous axial images were obtained from the base of the skull through the vertex without intravenous contrast. COMPARISON:  MRI head July 10, 2006 FINDINGS: BRAIN: The ventricles and sulci are normal. No intraparenchymal hemorrhage, mass effect nor midline shift. No acute large vascular territory infarcts. No abnormal extra-axial fluid collections. Basal cisterns are patent. VASCULAR: Unremarkable. SKULL/SOFT TISSUES: No skull fracture. No significant soft tissue swelling. ORBITS/SINUSES: The included ocular globes and orbital contents are normal.Trace paranasal sinus mucosal thickening. Mastoid air cells are well aerated. Pneumatized features apices. OTHER: None. IMPRESSION: Normal CT HEAD. Electronically Signed   By: Elon Alas M.D.   On: 05/09/2016 22:06    Procedures Procedures (including critical care time)  Medications Ordered in ED Medications  prochlorperazine (COMPAZINE) injection 10 mg  (10 mg Intravenous Given 05/09/16 2128)  diphenhydrAMINE (BENADRYL) injection 50 mg (50 mg Intravenous Given 05/09/16 2128)  sodium chloride 0.9 % bolus 1,000 mL (1,000 mLs Intravenous New Bag/Given 05/09/16 2128)  LORazepam (ATIVAN) injection 0.5 mg (0.5 mg Intravenous Given 05/09/16 2128)     Initial Impression / Assessment and Plan / ED Course  I have reviewed the triage vital signs and the nursing notes.  Pertinent labs & imaging results that were available during my care of the patient were reviewed by me and considered in my medical decision making (see chart for details).  Clinical Course    55 yo F With a chief complaint of left-sided headache and weakness. Going on all day and worsening. Slow in onset. No focal neurologic symptoms seems generally weak. My initial gestalt as this is likely anxiety provoked. Will treat with a headache cocktail obtain basic labs give IV fluids reassess.  On reassessment patient's tachycardia has completely resolved. She has much improvement of her headache.   11:10 PM:  I have discussed the diagnosis/risks/treatment options with the patient and family and believe the pt to be eligible for discharge home to follow-up with PCP. We also discussed returning to the ED immediately if new or worsening sx occur. We discussed the sx which are most concerning (e.g., sudden worsening pain, fever, inability to tolerate by mouth) that necessitate immediate return. Medications administered to the patient during their visit and any new prescriptions provided to the patient are listed below.  Medications given during this visit Medications  prochlorperazine (COMPAZINE) injection 10 mg (10 mg Intravenous Given 05/09/16 2128)  diphenhydrAMINE (BENADRYL) injection 50 mg (50 mg Intravenous Given 05/09/16 2128)  sodium chloride 0.9 % bolus 1,000 mL (1,000 mLs Intravenous New Bag/Given 05/09/16 2128)  LORazepam (ATIVAN) injection 0.5 mg (0.5 mg Intravenous Given 05/09/16 2128)      The patient appears reasonably screen and/or stabilized for discharge and I doubt any other medical condition or other [facility] requiring further screening, evaluation, or treatment in the ED at this time prior to discharge.    Final Clinical Impressions(s) / ED Diagnoses   Final diagnoses:  Acute nonintractable headache, unspecified headache type    New Prescriptions New Prescriptions   No medications on file     Deno Etienne, DO 05/09/16 2310

## 2016-05-09 NOTE — ED Notes (Signed)
Patient family member came to nurses' station, expressing concern that patient still has a headache and is not acting her usual self. He states that he is not comfortable taking her home with her current condition. MD made aware.

## 2016-05-09 NOTE — ED Notes (Signed)
Patient returned to room. Friend now at bedside. Patient appears more calm at this time. Remains A&O x 4, just has delayed responses.

## 2016-05-09 NOTE — ED Notes (Signed)
Patient verbalized understanding of discharge instructions and denies any further needs or questions at this time. VS stable. Patient ambulatory with steady gait. RN escorted to ED entrance in wheelchair.   

## 2016-05-09 NOTE — ED Notes (Signed)
MD at bedside. 

## 2016-05-10 NOTE — ED Notes (Signed)
This RN attempted to waste 1.5mg  Ativan in Pyxis, however d/t pt having already been discharged from the system, I was unable to. Garlon Hatchet, RN witnessed me waste 1.5mg  Ativan in the sink.

## 2016-05-11 ENCOUNTER — Ambulatory Visit (INDEPENDENT_AMBULATORY_CARE_PROVIDER_SITE_OTHER): Payer: BLUE CROSS/BLUE SHIELD | Admitting: Podiatry

## 2016-05-11 ENCOUNTER — Encounter: Payer: Self-pay | Admitting: Podiatry

## 2016-05-11 DIAGNOSIS — G629 Polyneuropathy, unspecified: Secondary | ICD-10-CM | POA: Diagnosis not present

## 2016-05-11 DIAGNOSIS — M79672 Pain in left foot: Secondary | ICD-10-CM

## 2016-05-11 DIAGNOSIS — M79671 Pain in right foot: Secondary | ICD-10-CM

## 2016-05-11 DIAGNOSIS — M779 Enthesopathy, unspecified: Secondary | ICD-10-CM | POA: Diagnosis not present

## 2016-05-11 MED ORDER — GABAPENTIN 300 MG PO CAPS: 300.0000 mg | ORAL_CAPSULE | Freq: Three times a day (TID) | ORAL | 3 refills | Status: DC

## 2016-05-13 NOTE — Progress Notes (Signed)
Subjective:     Patient ID: [name], female   DOB: 1961/01/18, 55 y.o.   MRN: [id number]  HPI patient presents stating I have had some improvement in my feet but I continue to have discomfort and I do still get burning pain on top   Review of Systems     Objective:   Physical Exam Neurovascular status unchanged with no indications of profound neuropathy with blood work reviewed indicating no indications of systemic inflammatory condition with moderate improvement upon palpation    Assessment:     Continued inflammatory changes with difficulty to make determination as to why it continues to bother her like it does    Plan:     Discussed possible neurological consult at one point but at this time [name] a try her on low-dose gabapentin to see what kind a difference it makes and I scanned for soft orthotics to try to reduce plantar pressures on the feet. I would also like to switch her to tennis shoes at work and I reviewed this with her we will do this when orthotics are returned

## 2016-05-17 ENCOUNTER — Telehealth: Payer: Self-pay | Admitting: Internal Medicine

## 2016-05-17 NOTE — Telephone Encounter (Signed)
APT. REMINDER CALL, NO ANSWER, NO VOICEMAIL °

## 2016-05-18 ENCOUNTER — Ambulatory Visit (INDEPENDENT_AMBULATORY_CARE_PROVIDER_SITE_OTHER): Payer: BLUE CROSS/BLUE SHIELD | Admitting: Internal Medicine

## 2016-05-18 ENCOUNTER — Encounter: Payer: Self-pay | Admitting: Internal Medicine

## 2016-05-18 VITALS — BP 136/80 | HR 81 | Temp 98.1°F | Ht 71.0 in | Wt 288.0 lb

## 2016-05-18 DIAGNOSIS — G4700 Insomnia, unspecified: Secondary | ICD-10-CM | POA: Diagnosis not present

## 2016-05-18 DIAGNOSIS — G894 Chronic pain syndrome: Secondary | ICD-10-CM | POA: Insufficient documentation

## 2016-05-18 MED ORDER — DULOXETINE HCL 30 MG PO CPEP: ORAL_CAPSULE | ORAL | 1 refills | Status: DC

## 2016-05-18 MED ORDER — DULOXETINE HCL 60 MG PO CPEP: ORAL_CAPSULE | ORAL | 0 refills | Status: DC

## 2016-05-18 NOTE — Progress Notes (Signed)
   CC: "Joint pain all over for many years"  HPI:  [name] is a 55 y.o. female with PMHx detailed below presenting with diffuse chronic joint and muscle pains for years. States that she recently has a sensation of tightness in her throat but has had no difficulty breathing or swallowing.   See problem based assessment and plan below for additional details.  Past Medical History:  Diagnosis Date  . Anemia 2009    Baseline hemoglobin at 11, secondary to iron deficiency  . Breast mass 02/2008    Noted on mammogram March 11, 2008 3 cm simple cyst at 9 to 10:00 position of the right breast as well as multiple other smaller simple cyst, 2.6 cm mass at the 10:00 position of the right breast also representing complex cyst,  . Deep vein thrombosis (DVT) (HCC)     history of multiple  DVTs in 1989, 1989, 2004, on chronic anticoagulation with Coumadin  . Diverticulosis    noted colonoscopy 2010  . Eczema   . Fibroid uterus  2001    status post total abdominal hysterectomy, right and left salpingo-oophorectomy , done by Dr. [name]  . Hypertension   . PE (pulmonary embolism)     history of multiple PEs in 1984, 1989, 2004-  electronic data reviewed in [place] and EMR and I was not able to find single CT angiogram that was positive for pulmonary embolus nor was I able to find Doppler studies that were positive for DVTs  . 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  Respiratory: Negative for cough, shortness of breath and wheezing.   Cardiovascular: Negative for chest pain.  Gastrointestinal: Negative for abdominal pain.  Musculoskeletal: Positive for back pain, joint pain,  myalgias and neck pain. Negative for falls.  Neurological: Positive for headaches.  Psychiatric/Behavioral: Negative for depression. The patient has insomnia. The patient is not nervous/anxious.     Physical Exam: Vitals:   05/18/16 0942  BP: 136/80  Pulse: 81  Temp: 98.1 F (36.7 C)  TempSrc: Oral  SpO2: 100%  Weight: 288 lb (130.6 kg)  Height: 5\' 11"  (1.803 m)   Body mass index is 40.17 kg/m. GENERAL- Tired-appearing woman sitting comfortably in exam room chair, alert, dressed in security uniform HEENT- Atraumatic, moist mucous membranes CARDIAC- Regular rate and rhythm, no murmurs, rubs or gallops. RESP- Clear to ascultation bilaterally, no wheezing or crackles, normal work of breathing ABDOMEN- Normoactive bowel sounds, soft, nontender, nondistended BACK- Normal curvature, diffuse mild tendernes to palpation along spine, mild tenderness to palpation of paraspinal muscles NEURO- Alert and oriented, cranial nerves grossly intact, strength and sensation grossly intact EXTREMITIES- Normal bulk and range of motion, no edema, 2+ peripheral pulses, no joint swelling or warmth, nearly every small/large joint with some degree of tenderness to palpation SKIN- Warm, dry, intact, without visible rash PSYCH- Guarded affect, clear speech, thoughts linear and goal-directed   Assessment & Plan:   See encounters tab for problem based medical decision making.  Patient seen with Dr. Lynnae January

## 2016-05-18 NOTE — Patient Instructions (Addendum)
  To help with your pain and potentially with weight loss, we have prescribed duloxetine (Cymbalta) - take one half tablet (30mg ) daily for the first week, then increase to a full tablet daily from then on.  For weight loss continue to exercise regularly and watch what you're eating and drinking. Try and mainly drink water. For food, focus on eating more vegetables and meat and minimize carbs from bread, rice, pasta, etc.  Remember to get a good amount of sleep at night (6-9 hours), otherwise you will have trouble losing weight.  To help with sleep, try over the counter medication Melatonin 5-10 mg, taken 30 minutes before bedtime.  Practice Good Sleep Hygiene Here are some suggestions Avoid napping during the day. It can disturb the normal pattern of sleep and wakefulness.  Avoid stimulants such as caffeine, nicotine, and alcohol too close to bedtime. While alcohol is well known to speed the onset of sleep, it disrupts sleep in the second half as the body begins to metabolize the alcohol, causing arousal.  Exercise can promote good sleep. Vigorous exercise should be taken in the morning or late afternoon. A relaxing exercise, like yoga, can be done before bed to help initiate a restful night's sleep. Food can be disruptive right before sleep. Stay away from large meals close to bedtime. Also dietary changes can cause sleep problems, if someone is struggling with a sleep problem, it's not a good time to start experimenting with spicy dishes. And, remember, chocolate has caffeine.  Ensure adequate exposure to natural light. This is particularly important for older people who may not venture outside as frequently as children and adults. Light exposure helps maintain a healthy sleep-wake cycle.  Establish a regular relaxing bedtime routine. Try to avoid emotionally upsetting conversations and activities before trying to go to sleep. Don't dwell on, or bring your problems to bed.  Associate your bed with  sleep. It's not a good idea to use your bed to watch TV, listen to the radio, or read.  Make sure that the sleep environment is pleasant and relaxing. The bed should be comfortable, the room should not be too hot or cold, or too bright.

## 2016-05-19 NOTE — Assessment & Plan Note (Signed)
Reports widespread persistent pain in her joints for the past four years. She reports significant stress in her life and that she feels "tense all the time" and that her feet hurt after walking at work all day. She presents to clinic today because recently she has noticed the intermittent sensation of "getting choked up" in her lower throat, that previous would only happen when she was angry or sad. She denies dysphagia, odynophagia, shortness of breath, or hoarseness. She denies feeling depressed or anxious, but reports that she has only been sleeping about 4 hrs/night for years - and also that she cannot lose weight. No clear findings on exam, has full ROM but every joint is non-swollen, non-erythematous, but mildly tender to palpation. Lupus and rheumatoid arthritis were ruled out via labs recently. Mild OA changes on previous XRs. Symptoms and clinical history concerning for fibromyalgia.  Assessment: chronic pain of multiple joints, muscle aches, of unclear etiology, suspect fibromylagia  Plan: - Cymbalta 30mg  QD for one week, then increase to 60mg  QD for chronic pain and suspected fibromyalgia, may also help with weight loss - Return in 6-8 weeks for symptom recheck

## 2016-05-19 NOTE — Assessment & Plan Note (Signed)
Reports difficulty sleeping more than 4 hours for years - mainly difficulty staying asleep vs early-waking. Has not tried medications.  Plan: - Recommended OTC Melatonin 5-10mg  QHS - Provided sleep hygiene suggestions

## 2016-05-21 NOTE — Progress Notes (Signed)
Internal Medicine Clinic Attending  I saw and evaluated the patient.  I personally confirmed the key portions of the history and exam documented by Dr. Johnson and I reviewed pertinent patient test results.  The assessment, diagnosis, and plan were formulated together and I agree with the documentation in the resident's note.  

## 2016-06-08 ENCOUNTER — Other Ambulatory Visit: Payer: BLUE CROSS/BLUE SHIELD

## 2016-06-15 ENCOUNTER — Encounter (HOSPITAL_COMMUNITY): Payer: Self-pay | Admitting: *Deleted

## 2016-06-15 ENCOUNTER — Emergency Department (HOSPITAL_COMMUNITY)
Admission: EM | Admit: 2016-06-15 | Discharge: 2016-06-16 | Disposition: A | Discharge status: Home / Self Care | Payer: BLUE CROSS/BLUE SHIELD | Attending: Emergency Medicine | Admitting: Emergency Medicine

## 2016-06-15 DIAGNOSIS — M545 Low back pain, unspecified: Secondary | ICD-10-CM

## 2016-06-15 DIAGNOSIS — Y92410 Unspecified street and highway as the place of occurrence of the external cause: Secondary | ICD-10-CM | POA: Diagnosis not present

## 2016-06-15 DIAGNOSIS — Y939 Activity, unspecified: Secondary | ICD-10-CM | POA: Insufficient documentation

## 2016-06-15 DIAGNOSIS — Z79899 Other long term (current) drug therapy: Secondary | ICD-10-CM | POA: Diagnosis not present

## 2016-06-15 DIAGNOSIS — Y999 Unspecified external cause status: Secondary | ICD-10-CM | POA: Diagnosis not present

## 2016-06-15 DIAGNOSIS — I10 Essential (primary) hypertension: Secondary | ICD-10-CM | POA: Insufficient documentation
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
## 2016-06-16 DIAGNOSIS — M545 Low back pain: Secondary | ICD-10-CM | POA: Diagnosis not present

## 2016-06-16 MED ORDER — METHOCARBAMOL 500 MG PO TABS: 500.0000 mg | ORAL_TABLET | Freq: Two times a day (BID) | ORAL | 0 refills | Status: DC | PRN

## 2016-06-16 MED ORDER — METHOCARBAMOL 500 MG PO TABS
500.0000 mg | ORAL_TABLET | Freq: Once | ORAL | Status: AC
  Administered 2016-06-16: 500 mg via ORAL
  Filled 2016-06-16: qty 1

## 2016-06-16 MED ORDER — NAPROXEN 250 MG PO TABS: 250.0000 mg | ORAL_TABLET | Freq: Two times a day (BID) | ORAL | 0 refills | Status: DC

## 2016-06-16 MED ORDER — ACETAMINOPHEN 325 MG PO TABS
650.0000 mg | ORAL_TABLET | Freq: Once | ORAL | Status: AC
  Administered 2016-06-16: 650 mg via ORAL
  Filled 2016-06-16: qty 2

## 2016-06-16 NOTE — ED Notes (Signed)
PA at bedside.

## 2016-06-21 ENCOUNTER — Telehealth: Payer: Self-pay | Admitting: Internal Medicine

## 2016-06-21 NOTE — Telephone Encounter (Signed)
APT. REMINDER CALL, NO ANSWER, MAILBOX FULL °

## 2016-06-22 ENCOUNTER — Encounter: Payer: BLUE CROSS/BLUE SHIELD | Admitting: Internal Medicine

## 2016-06-22 NOTE — Progress Notes (Deleted)
   CC: ***  HPI:  Ms.Tally R Franz is a 55 y.o. *** who presents today for ***. Please see assessment & plan for status of chronic medical problems.   Past Medical History:  Diagnosis Date  . Anemia 2009    Baseline hemoglobin at 11, secondary to iron deficiency  . Breast mass 02/2008    Noted on mammogram March 11, 2008 3 cm simple cyst at 9 to 10:00 position of the right breast as well as multiple other smaller simple cyst, 2.6 cm mass at the 10:00 position of the right breast also representing complex cyst,  . Deep vein thrombosis (DVT) (HCC)     history of multiple  DVTs in 1989, 1989, 2004, on chronic anticoagulation with Coumadin  . Diverticulosis    noted colonoscopy 2010  . Eczema   . Fibroid uterus  2001    status post total abdominal hysterectomy, right and left salpingo-oophorectomy , done by Dr. [name]  . Hypertension   . PE (pulmonary embolism)     history of multiple PEs in 1984, 1989, 2004-  electronic data reviewed in [place] and EMR and I was not able to find single CT angiogram that was positive for pulmonary embolus nor was I able to find Doppler studies that were positive for DVTs  . 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   Physical Exam:  There were no vitals filed for this visit. ***  Assessment & Plan:   See Encounters Tab for problem based charting.  Patient {GC/GE:[id number]::"discussed with","seen with"} Dr. {NAMES:[id number]::"Butcher","Granfortuna","E. Hoffman","Klima","Mullen","Narendra","Vincent"}

## 2016-06-30 NOTE — Progress Notes (Signed)
This encounter was created in error - please disregard.

## 2016-07-24 ENCOUNTER — Other Ambulatory Visit: Payer: Self-pay | Admitting: Oncology

## 2016-07-25 NOTE — Telephone Encounter (Signed)
I have only seen her once as PCP in the past year. She was overdue to refill this medication back in July. She did not show up to her appointment in November.  I'd recommend she follow-up in [facility] for BP recheck and will only prescribe 30 tablets. Please tell her she needs to make an appointment for future refills.

## 2016-07-26 NOTE — Telephone Encounter (Signed)
msg sent to front office to schedule appt.

## 2016-08-15 ENCOUNTER — Telehealth: Payer: Self-pay | Admitting: Internal Medicine

## 2016-08-15 NOTE — Telephone Encounter (Signed)
APT. REMINDER CALL, NO ANSWER, MAILBOX FULL °

## 2016-08-16 ENCOUNTER — Ambulatory Visit: Payer: BLUE CROSS/BLUE SHIELD

## 2016-08-25 ENCOUNTER — Other Ambulatory Visit: Payer: Self-pay | Admitting: Internal Medicine

## 2016-08-27 NOTE — Telephone Encounter (Signed)
Unfortunately, she no showed her appointment with me in late December and has yet to see me as PCP. As with the duloxetine, I'd be happy to refill this medication if she is agreeable to seeing me in [facility] clinic.

## 2016-08-27 NOTE — Telephone Encounter (Signed)
Per Dr. Durenda Age note, she was to follow-up to reassess response to therapy. Please ask her to schedule a follow-up appointment to see how she is tolerating this medication. I would be happy to refill until she can be seen provided she follows up.

## 2016-08-29 ENCOUNTER — Telehealth: Payer: Self-pay

## 2016-08-29 NOTE — Telephone Encounter (Signed)
Called patient ph (home [facility])& left msg to call back. She needs appt with pcp & agree to it before MD can fill duloxetine.

## 2016-08-29 NOTE — Telephone Encounter (Signed)
Please call the pharmacy regarding duloxetine.

## 2016-09-07 NOTE — Telephone Encounter (Signed)
PCP will not have any openings until May.  Sending message back to triage and PCP to make them aware.

## 2016-09-12 NOTE — Telephone Encounter (Signed)
Will await pcp decision

## 2016-09-14 MED ORDER — DULOXETINE HCL 60 MG PO CPEP: 60.0000 mg | ORAL_CAPSULE | Freq: Every day | ORAL | 0 refills | Status: DC

## 2016-09-14 NOTE — Addendum Note (Signed)
Addended by: Riccardo Dubin on: 09/14/2016 03:03 PM   Modules accepted: Orders

## 2016-09-14 NOTE — Telephone Encounter (Signed)
I spoke with Ms. [name] about creating additional appointment slots for March 9th. I will refill with instructions to see me in clinic come March.

## 2016-09-22 ENCOUNTER — Other Ambulatory Visit: Payer: Self-pay | Admitting: Internal Medicine

## 2016-09-24 NOTE — Telephone Encounter (Signed)
As this patient was seen by me and deemed necessary for their treatment, I will refill this prescription for amlodipine. She is scheduled to see me Friday, 2/9, at which time we can reassess her BP.

## 2016-09-27 ENCOUNTER — Telehealth: Payer: Self-pay | Admitting: Internal Medicine

## 2016-09-27 NOTE — Telephone Encounter (Signed)
Calling to confirm appt for 09/28/16 at 9:45

## 2016-09-28 ENCOUNTER — Encounter: Payer: Self-pay | Admitting: Internal Medicine

## 2016-09-28 ENCOUNTER — Ambulatory Visit (INDEPENDENT_AMBULATORY_CARE_PROVIDER_SITE_OTHER): Payer: Self-pay | Admitting: Internal Medicine

## 2016-09-28 ENCOUNTER — Encounter (INDEPENDENT_AMBULATORY_CARE_PROVIDER_SITE_OTHER): Payer: Self-pay

## 2016-09-28 DIAGNOSIS — Z6839 Body mass index (BMI) 39.0-39.9, adult: Secondary | ICD-10-CM

## 2016-09-28 DIAGNOSIS — Z79899 Other long term (current) drug therapy: Secondary | ICD-10-CM

## 2016-09-28 DIAGNOSIS — I10 Essential (primary) hypertension: Secondary | ICD-10-CM

## 2016-09-28 DIAGNOSIS — M2550 Pain in unspecified joint: Secondary | ICD-10-CM

## 2016-09-28 MED ORDER — AMLODIPINE BESYLATE 10 MG PO TABS: 10.0000 mg | ORAL_TABLET | Freq: Every day | ORAL | 11 refills | Status: DC

## 2016-09-28 NOTE — Progress Notes (Signed)
Medicine attending: Medical history, presenting problems, physical findings, and medications, reviewed with resident physician Dr Nina Blum on the day of the patient visit and I concur with her evaluation and management plan. 

## 2016-09-28 NOTE — Assessment & Plan Note (Signed)
BMI 39. She says she has tried eating healthy but it has not worked for her. She has significant joint pain which she relates to her weight. She ask for more information regarding bariatric surgery and was provided with information for central [place] surgery. Offered to have her speak with office dietitian but she declined.

## 2016-09-28 NOTE — Progress Notes (Signed)
   CC: follow up of hypertension   HPI: [name] is a 56 y.o. with past medical history as outlined below who presents to clinic for follow up of hypertension. She is here today for blood pressure check and amlodipine refills. She has no new concerns of complaints.    Please see problem list for status of the pt's chronic medical problems.  Past Medical History:  Diagnosis Date  . Anemia 2009    Baseline hemoglobin at 11, secondary to iron deficiency  . Breast mass 02/2008    Noted on mammogram March 11, 2008 3 cm simple cyst at 9 to 10:00 position of the right breast as well as multiple other smaller simple cyst, 2.6 cm mass at the 10:00 position of the right breast also representing complex cyst,  . Deep vein thrombosis (DVT) (HCC)     history of multiple  DVTs in 1989, 1989, 2004, on chronic anticoagulation with Coumadin  . Diverticulosis    noted colonoscopy 2010  . Eczema   . Fibroid uterus  2001    status post total abdominal hysterectomy, right and left salpingo-oophorectomy , done by Dr. [name]  . Hypertension   . PE (pulmonary embolism)     history of multiple PEs in 1984, 1989, 2004-  electronic data reviewed in [place] and EMR and I was not able to find single CT angiogram that was positive for pulmonary embolus nor was I able to find Doppler studies that were positive for DVTs  . 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  Physical Exam:  Vitals:   09/28/16 0927  BP: 132/81  Pulse: 80  Temp: 97.9 F (36.6 C)  TempSrc: Oral  SpO2: 100%  Weight: 283 lb 1.6 oz (128.4 kg)   Physical Exam  Constitutional: She appears well-developed and  well-nourished. No distress.  Eyes: Conjunctivae are normal. No scleral icterus.  Cardiovascular: Normal rate and regular rhythm.   No murmur heard. Pulmonary/Chest: Effort normal. No respiratory distress. She has no wheezes. She has no rales.  Neurological: She is alert.  Skin: She is not diaphoretic.  Psychiatric: She has a normal mood and affect. Her behavior is normal.    Assessment & Plan:   See Encounters Tab for problem based charting.  Hypertension  BP well controlled today, better than goal on current regiment.  - Refilled Amlodipine 10 mg qd   Morbid obesity  BMI 39. She says she has tried eating healthy but it has not worked for her. She has significant joint pain which she relates to her weight. She ask for more information regarding bariatric surgery and was provided with information for central [place] surgery. Offered to have her speak with office dietitian but she declined.   Patient discussed with Dr. Beryle Beams

## 2016-09-28 NOTE — Patient Instructions (Signed)
It was a pleasure to meet you today [name]   For your blood pressure, you're doing a great job, keep taking the amlodipine.   Please schedule a follow up appointment in 2 months

## 2016-09-28 NOTE — Assessment & Plan Note (Signed)
BP well controlled today, better than goal on current regiment.  - Refilled Amlodipine 10 mg qd

## 2016-11-26 ENCOUNTER — Other Ambulatory Visit: Payer: Self-pay | Admitting: *Deleted

## 2016-11-26 ENCOUNTER — Telehealth: Payer: Self-pay

## 2016-11-26 NOTE — Telephone Encounter (Signed)
I spoke with the patient by phone. She first told me she is on 60 mg capsules and has had "terror attacks" for the last 2-3 weeks. She was doing fine on 30 mg capsules and wants to come off the medicine altogether. She recalls starting this medicine to help with her pain and weight loss. She then told me she took a 30 mg capsule and has about 10 leftover. I recommended she continue taking 30 mg capsules and follow-up tomorrow in [facility]. She acknowledged missing her last follow-up appointment with me, and I encouraged her to be more consistent with appointments for monitoring on these kinds of medications.  Also, she asked my opinion for a 30-day weight loss medication she saw on TV, and I told her I would be happy to look into it if she brought information with her tomorrow. I told her I would not recommended anything in good faith without looking into it and that more often than not, rapid weight loss regimens set people up for more harm than good. She reassured me that she just needed a kick start and will bring in the information none the less.

## 2016-11-26 NOTE — Telephone Encounter (Signed)
Need to speak with a nurse about DULoxetine (CYMBALTA) 60 MG capsule. Please call pt back.

## 2016-11-26 NOTE — Telephone Encounter (Signed)
Pt states she has started having nightmares since starting cymbalta, she states they are horrible and wants to quit, advised not to suddenly stop med, gave appt 4/7 at [facility], spoke w/ dr Maudie Mercury for advice

## 2016-11-27 ENCOUNTER — Encounter: Payer: Self-pay | Admitting: Internal Medicine

## 2016-11-27 ENCOUNTER — Ambulatory Visit (INDEPENDENT_AMBULATORY_CARE_PROVIDER_SITE_OTHER): Payer: Self-pay | Admitting: Internal Medicine

## 2016-11-27 ENCOUNTER — Encounter (INDEPENDENT_AMBULATORY_CARE_PROVIDER_SITE_OTHER): Payer: Self-pay

## 2016-11-27 DIAGNOSIS — F515 Nightmare disorder: Secondary | ICD-10-CM | POA: Insufficient documentation

## 2016-11-27 DIAGNOSIS — R110 Nausea: Secondary | ICD-10-CM

## 2016-11-27 DIAGNOSIS — Z79899 Other long term (current) drug therapy: Secondary | ICD-10-CM

## 2016-11-27 DIAGNOSIS — Z6839 Body mass index (BMI) 39.0-39.9, adult: Secondary | ICD-10-CM

## 2016-11-27 DIAGNOSIS — R51 Headache: Secondary | ICD-10-CM

## 2016-11-27 NOTE — Assessment & Plan Note (Signed)
  Reports that she was started on Cymbalta 30 mg back in September. Never had any sided effects while on the 30 mg daily. She was initially started do on it for suspected fibromyalgia. Has not seen any benefit since taking it. In the past month she increased the dose to 60 mg daily. After increasing the dose she started having disturbing dreams a few times a week. Increased in frequency to every night. Reports she is afraid to go to sleep. Having headaches and nausea. Reports she has not been able to have a good night of sleep in the past 3 weeks. Spoke with Dr. Posey Pronto yesterday and decreased the dose to 30 mg.   Assessment: Nightmares likely secondary to medication   Plan: Only >1% incidence of abnormal dreams on uptodate on Cymbalta but her time course does correlate with the nightmares. She wishes to come off the Cymbalta completely. Will continue the 30 mg dose for another week and stop.   RTC in 2 weeks for follow up.

## 2016-11-27 NOTE — Patient Instructions (Signed)
Ms. Fix,  Continue the Cymbalta 30 mg for the next 6 days and then you can stop taking it. You can try benadryl or melatonin to help you sleep in th meantime.  I would like to see you back in 2 weeks for follow up on the nightmares and blood pressure.

## 2016-11-27 NOTE — Assessment & Plan Note (Signed)
[name] wishes to discuss weight loss products today. She has had no success with diet and exercise and wishes to come off her Cymbalta today due to nightmares. She reports seeing an ad for Contrave (buproprion-naltrexone) on TV and is interested in starting this medication. Discussed with her that I would like to taper her off the Cymbalta before starting any additional medications.   Will have her follow up after tapering off Cymablta for further discussion.

## 2016-11-27 NOTE — Progress Notes (Signed)
   CC: Nightmares  HPI:  [name] is a 56 y.o. female with a past medical history listed below here today with complaints of nightmares.  For details of today's visit and the status of her chronic medical issues please refer to the assessment and plan.   Past Medical History:  Diagnosis Date  . Anemia 2009    Baseline hemoglobin at 11, secondary to iron deficiency  . Breast mass 02/2008    Noted on mammogram March 11, 2008 3 cm simple cyst at 9 to 10:00 position of the right breast as well as multiple other smaller simple cyst, 2.6 cm mass at the 10:00 position of the right breast also representing complex cyst,  . Deep vein thrombosis (DVT) (HCC)     history of multiple  DVTs in 1989, 1989, 2004, on chronic anticoagulation with Coumadin  . Diverticulosis    noted colonoscopy 2010  . Eczema   . Fibroid uterus  2001    status post total abdominal hysterectomy, right and left salpingo-oophorectomy , done by Dr. [name]  . Hypertension   . PE (pulmonary embolism)     history of multiple PEs in 1984, 1989, 2004-  electronic data reviewed in [place] and EMR and I was not able to find single CT angiogram that was positive for pulmonary embolus nor was I able to find Doppler studies that were positive for DVTs  . 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  morbidly obese female in no acute distress  Cardiovascular: Normal rate, regular rhythm and normal  heart sounds.   Pulmonary/Chest: Effort normal and breath sounds normal.  Neurological: She is alert and oriented to person, place, and time.  Skin: Skin is warm and dry.  Psychiatric:  Anxious appearing    Assessment & Plan:   See Encounters Tab for problem based charting.  Patient discussed with Dr. Angelia Mould

## 2016-11-29 NOTE — Progress Notes (Signed)
Internal Medicine Clinic Attending  Case discussed with Dr. Boswell at the time of the visit.  We reviewed the resident's history and exam and pertinent patient test results.  I agree with the assessment, diagnosis, and plan of care documented in the resident's note.  

## 2016-12-10 ENCOUNTER — Ambulatory Visit (INDEPENDENT_AMBULATORY_CARE_PROVIDER_SITE_OTHER): Payer: Self-pay | Admitting: Internal Medicine

## 2016-12-10 DIAGNOSIS — Z6841 Body Mass Index (BMI) 40.0 and over, adult: Secondary | ICD-10-CM

## 2016-12-10 DIAGNOSIS — F515 Nightmare disorder: Secondary | ICD-10-CM

## 2016-12-10 DIAGNOSIS — G478 Other sleep disorders: Secondary | ICD-10-CM

## 2016-12-10 DIAGNOSIS — T43215D Adverse effect of selective serotonin and norepinephrine reuptake inhibitors, subsequent encounter: Secondary | ICD-10-CM

## 2016-12-10 MED ORDER — NALTREXONE-BUPROPION HCL ER 8-90 MG PO TB12: ORAL_TABLET | ORAL | 1 refills | Status: DC

## 2016-12-10 NOTE — Assessment & Plan Note (Signed)
Ms. Myszka reports she has tapered herself off the Cymbalta and has been off it completely for about 2 weeks now. She says that since stopping the medication she has not had any further nightmares and is sleeping better.   Plan: Discontinue Cymbalta; no interest in starting any other medications for her suspected fibromyalgia.

## 2016-12-10 NOTE — Progress Notes (Deleted)
   CC: ***  HPI:  [name] is a 56 y.o.   Past Medical History:  Diagnosis Date  . Anemia 2009    Baseline hemoglobin at 11, secondary to iron deficiency  . Breast mass 02/2008    Noted on mammogram March 11, 2008 3 cm simple cyst at 9 to 10:00 position of the right breast as well as multiple other smaller simple cyst, 2.6 cm mass at the 10:00 position of the right breast also representing complex cyst,  . Deep vein thrombosis (DVT) (HCC)     history of multiple  DVTs in 1989, 1989, 2004, on chronic anticoagulation with Coumadin  . Diverticulosis    noted colonoscopy 2010  . Eczema   . Fibroid uterus  2001    status post total abdominal hysterectomy, right and left salpingo-oophorectomy , done by Dr. [name]  . Hypertension   . PE (pulmonary embolism)     history of multiple PEs in 1984, 1989, 2004-  electronic data reviewed in [place] and EMR and I was not able to find single CT angiogram that was positive for pulmonary embolus nor was I able to find Doppler studies that were positive for DVTs  . 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 ***  Assessment & Plan:   See Encounters Tab for problem based charting.  Patient {GC/GE:[id number]::"discussed with","seen with"} Dr. {NAMES:[id number]::"Butcher","Granfortuna","E. Hoffman","Klima","Mullen","Narendra","Vincent"}

## 2016-12-10 NOTE — Progress Notes (Signed)
   CC: Nightmares follow up  HPI:  [name] is a 56 y.o. female with a past medical history listed below here today for follow up of her nightmares.  For details of today's visit and the status of her chronic medical issues please refer to the assessment and plan.   Past Medical History:  Diagnosis Date  . Anemia 2009    Baseline hemoglobin at 11, secondary to iron deficiency  . Breast mass 02/2008    Noted on mammogram March 11, 2008 3 cm simple cyst at 9 to 10:00 position of the right breast as well as multiple other smaller simple cyst, 2.6 cm mass at the 10:00 position of the right breast also representing complex cyst,  . Deep vein thrombosis (DVT) (HCC)     history of multiple  DVTs in 1989, 1989, 2004, on chronic anticoagulation with Coumadin  . Diverticulosis    noted colonoscopy 2010  . Eczema   . Fibroid uterus  2001    status post total abdominal hysterectomy, right and left salpingo-oophorectomy , done by Dr. Jodi [name]  . Hypertension   . PE (pulmonary embolism)     history of multiple PEs in 1984, 1989, 2004-  electronic data reviewed in [place] and EMR and I was not able to find single CT angiogram that was positive for pulmonary embolus nor was I able to find Doppler studies that were positive for DVTs  . 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  Obese female  Cardiovascular: Normal rate and regular rhythm.   Pulmonary/Chest: Effort normal and  breath sounds normal.  Vitals reviewed.    Assessment & Plan:   See Encounters Tab for problem based charting.  Patient discussed with Dr. Lynnae January

## 2016-12-10 NOTE — Assessment & Plan Note (Signed)
Ms. Wordell reports she has been researching the naltrexone-bupropion online for the past few weeks. Has read the side effect profile and would like to try this medication to get her 'jump started.' She has unrealistic goals in mind and reported that when she comes back in a month she expects to be 20 lbs down. Had discussion on expectations and healthy weight loss. Discussed that her diet and exercise will still be the most important aspect. She voiced understanding. She also expressed interest in bariatric surgery and requested more information.  Plan: Will start naltrexone-bupropion 8-90 mg today with instructions to taper up to 2 pills bid Given information on bariatric class  Follow up in 1 month

## 2016-12-10 NOTE — Patient Instructions (Addendum)
Ms. Koeller,  I have sent in a prescription for the Contrave.  Register for a Weight-Loss Seminar Take the first step toward transforming your life. Sign up for a bariatrics seminar to explore your options for weight-loss surgery. [facility] [place] for a weight-loss seminar in [place], or watch our online webinar; for more information, call [phone].  [place] for a weight-loss seminar in [place]; for more information, call the [facility] at [phone]. Follow up in about 1 month

## 2016-12-10 NOTE — Progress Notes (Signed)
Internal Medicine Clinic Attending  Case discussed with Dr. Boswell at the time of the visit.  We reviewed the resident's history and exam and pertinent patient test results.  I agree with the assessment, diagnosis, and plan of care documented in the resident's note.  

## 2017-01-21 ENCOUNTER — Encounter: Payer: Self-pay | Admitting: *Deleted

## 2017-04-05 ENCOUNTER — Encounter (HOSPITAL_COMMUNITY): Payer: Self-pay | Admitting: Emergency Medicine

## 2017-04-05 DIAGNOSIS — R42 Dizziness and giddiness: Secondary | ICD-10-CM | POA: Insufficient documentation

## 2017-04-05 DIAGNOSIS — R531 Weakness: Secondary | ICD-10-CM | POA: Insufficient documentation

## 2017-04-05 DIAGNOSIS — R55 Syncope and collapse: Secondary | ICD-10-CM | POA: Insufficient documentation

## 2017-04-05 DIAGNOSIS — I10 Essential (primary) hypertension: Secondary | ICD-10-CM | POA: Insufficient documentation

## 2017-04-05 DIAGNOSIS — R0602 Shortness of breath: Secondary | ICD-10-CM | POA: Diagnosis not present

## 2017-04-05 DIAGNOSIS — Z79899 Other long term (current) drug therapy: Secondary | ICD-10-CM | POA: Insufficient documentation

## 2017-04-05 DIAGNOSIS — Z7982 Long term (current) use of aspirin: Secondary | ICD-10-CM | POA: Diagnosis not present

## 2017-04-05 LAB — CBC
HCT: 36.5 % (ref 36.0–46.0)
Hemoglobin: 12.3 g/dL (ref 12.0–15.0)
MCH: 28.3 pg (ref 26.0–34.0)
MCHC: 33.7 g/dL (ref 30.0–36.0)
MCV: 83.9 fL (ref 78.0–100.0)
Platelets: 328 K/uL (ref 150–400)
RBC: 4.35 MIL/uL (ref 3.87–5.11)
RDW: 13.8 % (ref 11.5–15.5)
WBC: 7.4 K/uL (ref 4.0–10.5)

## 2017-04-05 LAB — BASIC METABOLIC PANEL
Anion gap: 9 (ref 5–15)
BUN: 17 mg/dL (ref 6–20)
CO2: 25 mmol/L (ref 22–32)
Calcium: 9.7 mg/dL (ref 8.9–10.3)
Chloride: 107 mmol/L (ref 101–111)
Creatinine, Ser: 1.1 mg/dL — ABNORMAL HIGH (ref 0.44–1.00)
GFR calc Af Amer: >60 mL/min (ref >60)
GFR calc non Af Amer: 54 mL/min — ABNORMAL LOW (ref >60)
Glucose, Bld: 96 mg/dL (ref 65–99)
Potassium: 3.3 mmol/L — ABNORMAL LOW (ref 3.5–5.1)
Sodium: 141 mmol/L (ref 135–145)

## 2017-04-05 NOTE — ED Triage Notes (Signed)
Pt comes in with complaints of dizzy spells that have occurred randomly over the past several days.  She was at a [employer] where she felt weak and dizzy and fell because her legs went out from underneath her.  Denies LOC. Denies chest pain.  States she occasionally feels like she is having palpitations. Guest with patient states she has not eaten much today.

## 2017-04-06 ENCOUNTER — Emergency Department (HOSPITAL_COMMUNITY): Payer: PRIVATE HEALTH INSURANCE

## 2017-04-06 ENCOUNTER — Encounter (HOSPITAL_COMMUNITY): Payer: Self-pay | Admitting: Radiology

## 2017-04-06 ENCOUNTER — Emergency Department (HOSPITAL_COMMUNITY)
Admission: EM | Admit: 2017-04-06 | Discharge: 2017-04-06 | Disposition: A | Discharge status: Home / Self Care | Payer: PRIVATE HEALTH INSURANCE | Attending: Emergency Medicine | Admitting: Emergency Medicine

## 2017-04-06 DIAGNOSIS — R55 Syncope and collapse: Secondary | ICD-10-CM

## 2017-04-06 DIAGNOSIS — R42 Dizziness and giddiness: Secondary | ICD-10-CM

## 2017-04-06 DIAGNOSIS — G8929 Other chronic pain: Secondary | ICD-10-CM

## 2017-04-06 DIAGNOSIS — R531 Weakness: Secondary | ICD-10-CM

## 2017-04-06 DIAGNOSIS — M25562 Pain in left knee: Secondary | ICD-10-CM

## 2017-04-06 LAB — URINALYSIS, ROUTINE W REFLEX MICROSCOPIC
Bilirubin Urine: NEGATIVE
Glucose, UA: NEGATIVE mg/dL
Hgb urine dipstick: NEGATIVE
Ketones, ur: NEGATIVE mg/dL
Nitrite: NEGATIVE
Protein, ur: 30 mg/dL — AB
Specific Gravity, Urine: 1.025 (ref 1.005–1.030)
pH: 5 (ref 5.0–8.0)

## 2017-04-06 LAB — I-STAT TROPONIN, ED
Troponin i, poc: 0 ng/mL (ref 0.00–0.08)
Troponin i, poc: 0 ng/mL (ref 0.00–0.08)

## 2017-04-06 LAB — BRAIN NATRIURETIC PEPTIDE: B Natriuretic Peptide: 12.2 pg/mL (ref 0.0–100.0)

## 2017-04-06 LAB — D-DIMER, QUANTITATIVE (NOT AT ARMC): D-Dimer, Quant: 0.35 ug/mL-FEU (ref 0.00–0.50)

## 2017-04-06 MED ORDER — HYDROCODONE-ACETAMINOPHEN 5-325 MG PO TABS: 1.0000 | ORAL_TABLET | Freq: Four times a day (QID) | ORAL | 0 refills | Status: DC | PRN

## 2017-04-06 MED ORDER — KETOROLAC TROMETHAMINE 30 MG/ML IJ SOLN
30.0000 mg | Freq: Once | INTRAMUSCULAR | Status: AC
  Administered 2017-04-06: 30 mg via INTRAVENOUS
  Filled 2017-04-06: qty 1

## 2017-04-06 MED ORDER — IOPAMIDOL (ISOVUE-370) INJECTION 76%
INTRAVENOUS | Status: AC
  Filled 2017-04-06: qty 100

## 2017-04-06 MED ORDER — LORAZEPAM 2 MG/ML IJ SOLN
1.0000 mg | Freq: Once | INTRAMUSCULAR | Status: AC
  Administered 2017-04-06: 1 mg via INTRAVENOUS
  Filled 2017-04-06: qty 1

## 2017-04-06 MED ORDER — GADOBENATE DIMEGLUMINE 529 MG/ML IV SOLN
20.0000 mL | Freq: Once | INTRAVENOUS | Status: AC
  Administered 2017-04-06: 20 mL via INTRAVENOUS

## 2017-04-06 MED ORDER — MELOXICAM 7.5 MG PO TABS: 7.5000 mg | ORAL_TABLET | Freq: Every day | ORAL | 0 refills | Status: DC

## 2017-04-06 MED ORDER — MORPHINE SULFATE (PF) 2 MG/ML IV SOLN
4.0000 mg | Freq: Once | INTRAVENOUS | Status: AC
  Administered 2017-04-06: 4 mg via INTRAVENOUS
  Filled 2017-04-06: qty 2

## 2017-04-06 MED ORDER — IOPAMIDOL (ISOVUE-370) INJECTION 76%: 100.0000 mL | Freq: Once | INTRAVENOUS | Status: DC | PRN

## 2017-04-06 MED ORDER — ONDANSETRON HCL 4 MG/2ML IJ SOLN
4.0000 mg | Freq: Once | INTRAMUSCULAR | Status: AC
  Administered 2017-04-06: 4 mg via INTRAVENOUS
  Filled 2017-04-06: qty 2

## 2017-04-06 MED ORDER — SODIUM CHLORIDE 0.9 % IV BOLUS (SEPSIS)
1000.0000 mL | Freq: Once | INTRAVENOUS | Status: AC
  Administered 2017-04-06: 1000 mL via INTRAVENOUS

## 2017-04-06 MED ORDER — HYDROCODONE-ACETAMINOPHEN 5-325 MG PO TABS
1.0000 | ORAL_TABLET | Freq: Once | ORAL | Status: AC
  Administered 2017-04-06: 1 via ORAL
  Filled 2017-04-06: qty 1

## 2017-04-06 NOTE — ED Provider Notes (Signed)
[facility] DEPT Provider Note   CSN: [id number] Arrival date & time: 04/05/17  2235     History   Chief Complaint Chief Complaint  Patient presents with  . Dizziness  . Fall    HPI [name] is a 56 y.o. female.  HPI Pt comes in with cc of dizziness, weakness, shortness of breath. Pt has hx of PE in the past. She reports that for the last 3-4 days she has had intermittent episodes of dizziness and on occasion she has felt like she might pass out. Pt also has noted that her left side feels weaker. Pt has bad knees, and her legs have given out on occasion as well. Pt has hx of PE, she was on OCP at that time. She reports feeling a little short of breath lately. Pt has no chest pain. Pt denies smoking, drinking or drug use.  Past Medical History:  Diagnosis Date  . Anemia 2009    Baseline hemoglobin at 11, secondary to iron deficiency  . Breast mass 02/2008    Noted on mammogram March 11, 2008 3 cm simple cyst at 9 to 10:00 position of the right breast as well as multiple other smaller simple cyst, 2.6 cm mass at the 10:00 position of the right breast also representing complex cyst,  . Deep vein thrombosis (DVT) (HCC)     history of multiple  DVTs in 1989, 1989, 2004, on chronic anticoagulation with Coumadin  . Diverticulosis    noted colonoscopy 2010  . Eczema   . Fibroid uterus  2001    status post total abdominal hysterectomy, right and left salpingo-oophorectomy , done by Dr. [name]  . Hypertension   . PE (pulmonary embolism)     history of multiple PEs in 1984, 1989, 2004-  electronic data reviewed in [place] and EMR and I was not able to find single CT angiogram that was positive for pulmonary embolus nor was I able to find Doppler studies that were positive for DVTs  . 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 Nightmares 11/27/2016  . Obesity, morbid ([place]) 09/28/2016  . Chronic pain syndrome 05/18/2016  . Hyperlipidemia 01/27/2014  . Palpitations 12/30/2013  . Headache(784.0) 09/29/2013  . Bilateral knee pain 05/25/2013  . Metatarsalgia of both feet 05/25/2013  . Eczema 05/12/2013  . Insomnia 05/12/2013  . Diverticulosis 04/18/2012  . Ovarian cyst, left 04/16/2012  . Breast cyst 10/02/2011  . Arthritis 02/07/2011  . Essential hypertension 05/29/2006  . PULMONARY EMBOLISM, HX OF 05/29/2006    Past Surgical History:  Procedure Laterality Date  . ABDOMINAL HYSTERECTOMY  04/13/2010   done by Dr. [name]  . CHOLECYSTECTOMY     1997  . MASTECTOMY, PARTIAL  03/2006    left partial mastectomy secondary to fibrocystic disease intraluminal fibroadenoma performed March 28, 2006 done by Dr. Fernand Parkins History    No data available       Home Medications    Prior to Admission medications   Medication Sig Start Date End Date Taking? Authorizing Provider  amLODipine (NORVASC) 10 MG tablet Take 1 tablet (10 mg total) by mouth daily. 09/28/16  Yes Ledell Noss, MD  aspirin EC 81 MG tablet Take 81  mg by mouth daily.   Yes [provider]  Naltrexone-Bupropion HCl ER 8-90 MG TB12 Once daily for a week, then increase by 1 pill weekly until you are taking 2 pills twice a day Patient taking differently: Take 2 tablets by mouth 2 (two) times daily.  12/10/16  Yes Maryellen Pile, MD  DULoxetine (CYMBALTA) 60 MG capsule Take 1 capsule (60 mg total) by mouth daily. See PCP for further refills. Patient not taking: Reported on 04/06/2017 09/14/16   Riccardo Dubin, MD  gabapentin (NEURONTIN) 300 MG capsule Take 1 capsule (300 mg total) by mouth 3 (three) times daily. Patient not taking: Reported on 04/06/2017 05/11/16   Wallene Huh, DPM    Family  History Family History  Problem Relation Age of Onset  . Cancer Unknown   . Clotting disorder Unknown   . Gout Father   . Stroke Neg Hx     Social History Social History  Substance Use Topics  . Smoking status: Never Smoker  . Smokeless tobacco: Never Used  . Alcohol use No     Allergies   Dilaudid [hydromorphone hcl]   Review of Systems Review of Systems  Constitutional: Positive for activity change.  Respiratory: Positive for shortness of breath.   Cardiovascular: Positive for palpitations. Negative for chest pain.  Gastrointestinal: Negative for abdominal pain.  Genitourinary: Negative for dysuria.  Hematological: Does not bruise/bleed easily.  All other systems reviewed and are negative.    Physical Exam Updated Vital Signs BP (!) 143/85 (BP Location: Right Arm)   Pulse 71   Temp 98.2 F (36.8 C) (Oral)   Resp 14   Ht 5\' 11"  (1.803 m)   Wt 122.5 kg (270 lb)   SpO2 96%   BMI 37.66 kg/m   Physical Exam  Constitutional: She is oriented to person, place, and time. She appears well-developed.  HENT:  Head: Normocephalic and atraumatic.  Eyes: EOM are normal.  Neck: Normal range of motion. Neck supple.  Cardiovascular: Normal rate and intact distal pulses.   Pulmonary/Chest: Effort normal and breath sounds normal. No respiratory distress. She has no wheezes.  Abdominal: Bowel sounds are normal.  Neurological: She is alert and oriented to person, place, and time. No cranial nerve deficit. Coordination normal.  L sided weakness and numbness. Pt on exam has 4+/5 upper and lower ext strength and pt is able to discriminate between sharp and dull.   Skin: Skin is warm and dry.  Nursing note and vitals reviewed.    ED Treatments / Results  Labs (all labs ordered are listed, but only abnormal results are displayed) Labs Reviewed  BASIC METABOLIC PANEL - Abnormal; Notable for the following:       Result Value   Potassium 3.3 (*)    Creatinine, Ser 1.10 (*)     GFR calc non Af Amer 54 (*)    All other components within normal limits  URINALYSIS, ROUTINE W REFLEX MICROSCOPIC - Abnormal; Notable for the following:    Protein, ur 30 (*)    Leukocytes, UA TRACE (*)    Bacteria, UA RARE (*)    Squamous Epithelial / LPF 0-5 (*)    All other components within normal limits  CBC  BRAIN NATRIURETIC PEPTIDE  D-DIMER, QUANTITATIVE (NOT AT [facility])  CBG MONITORING, ED  I-STAT TROPONIN, ED  I-STAT TROPONIN, ED    EKG  EKG Interpretation  Date/Time:  Friday April 05 2017 23:01:11 EDT Ventricular Rate:  97 PR Interval:  QRS Duration: 87 QT Interval:  363 QTC Calculation: 462 R Axis:   62 Text Interpretation:  Sinus rhythm Low voltage, precordial leads Abnormal R-wave progression, early transition No acute changes Confirmed by Varney Biles [phone]) on 04/06/2017 4:33:05 AM       Radiology No results found.  Procedures Procedures (including critical care time)  Medications Ordered in ED Medications  sodium chloride 0.9 % bolus 1,000 mL (1,000 mLs Intravenous New Bag/Given 04/06/17 0742)     Initial Impression / Assessment and Plan / ED Course  I have reviewed the triage vital signs and the nursing notes.  Pertinent labs & imaging results that were available during my care of the patient were reviewed by me and considered in my medical decision making (see chart for details).  Clinical Course as of Apr 07 807  Sat Apr 06, 2017  0806 Results from the ER workup discussed with the patient face to face and all questions answered to the best of my ability.  Dimer and trops neg. Labs reassuring. [facility] as [facility] no [facility] capabilities this weekend.  Pt will go via private conveyance.   [AN]  [id number] If workup in the ER is neg, pt can see her PCP.  Strict ER return precautions have been discussed, and patient is agreeing with the plan and is comfortable with the workup done and the recommendations from the ER.   [AN]     Clinical Course User Index [AN] Varney Biles, MD    Pt comes in with cc of dizziness, near syncope and ROS is + for some dib. Pt has hx of PE. No stroke hx. Pt also has some knee issues, likely OA, and her legs have given out. Pt has no new back pain. My concerns ddx includes stroke, vertebral artery narrowing and PE. PE is overall low on the ddx, as the dib really came about on ROS and pt has no chest pain. WELLS score is 1.5. Of course PE can present as dizziness/ near syncope if it is a large PE. We will get dimer. Pt has no cardiac risk factors. EKG is reassuring. Will monitor in the Er on tele.   Pt is having L sided weakness as well. We will get MRI and MRA head and neck.    Final Clinical Impressions(s) / ED Diagnoses   Final diagnoses:  Dizziness  Near syncope  Left-sided weakness    New Prescriptions New Prescriptions   No medications on file     Varney Biles, MD 04/06/17 [phone]

## 2017-04-06 NOTE — ED Notes (Signed)
Pt assisted back to bed from bedside commode.

## 2017-04-06 NOTE — ED Notes (Signed)
Dr. Mesner at bedside   

## 2017-04-06 NOTE — ED Notes (Signed)
Pt declined crutches after consult with ortho.  Feels she'd be more comfortable getting a cane for use at home.

## 2017-04-06 NOTE — ED Notes (Signed)
Medications given after dilution withsaline.

## 2017-04-06 NOTE — ED Notes (Signed)
Pt ambulated to the bathroom. Pt tolerated poorly. While in restroom pt states that she feels like she is about to "fall out again". Pt returned to room/stretcher via wheelchair. No episode of syncope at this time.

## 2017-04-06 NOTE — ED Notes (Signed)
Pt resting.  Family at bedside.  IVF's still infusing due to partial occulusion that has now been resolved.  States she would like pain medication that she spoke to the previous EDP about.  Will update the current EDP.

## 2017-04-06 NOTE — ED Notes (Signed)
Pt stated cant void at this time 

## 2017-04-06 NOTE — ED Notes (Signed)
Iv attempted x 2 withput success. 

## 2017-04-06 NOTE — ED Notes (Signed)
Pt didn't want to walk.  She had RA sats of 97-98% at rest.

## 2017-04-06 NOTE — Progress Notes (Signed)
Orthopedic Tech Progress Note Patient Details:  [name] 1961-07-08 [id number] Patient did not want crutches Ortho Devices Type of Ortho Device: Crutches   Braulio Bosch 04/06/2017, 7:21 PM

## 2017-04-06 NOTE — ED Provider Notes (Signed)
6:43 PM Assumed care from Dr. Wilson Singer who took care from Dr. Kathrynn Humble, please see their note for full history, physical and decision making until this point. In brief this is a 56 y.o. year old female who presented to the ED tonight with Dizziness and Fall     Patient has been in our emergency room all day wait an MRI to rule out stroke or other neurologic causes for her weakness. MRI was negative. On reevaluation it sounds like from with the patient saying that she thinks is more related to her knee and knee pain may be some aspect of deconditioning. I think a short course of narcotic pain medicine, anti-inflammatories and physical therapy would be useful. She will try to obtain neurologic follow-up for EMG or further testing. Also will try to find follow-up with orthopedics for her primary knee issues. We'll give her a knee brace and crutches from here. Encouraged her to be as active as possible within her limitations.  Discharge instructions, including strict return precautions for new or worsening symptoms, given. Patient and/or family verbalized understanding and agreement with the plan as described.   Labs, studies and imaging reviewed by myself and considered in medical decision making if ordered. Imaging interpreted by radiology.  Labs Reviewed  BASIC METABOLIC PANEL - Abnormal; Notable for the following:       Result Value   Potassium 3.3 (*)    Creatinine, Ser 1.10 (*)    GFR calc non Af Amer 54 (*)    All other components within normal limits  URINALYSIS, ROUTINE W REFLEX MICROSCOPIC - Abnormal; Notable for the following:    Protein, ur 30 (*)    Leukocytes, UA TRACE (*)    Bacteria, UA RARE (*)    Squamous Epithelial / LPF 0-5 (*)    All other components within normal limits  CBC  BRAIN NATRIURETIC PEPTIDE  D-DIMER, QUANTITATIVE (NOT AT [facility])  CBG MONITORING, ED  I-STAT TROPONIN, ED  I-STAT TROPONIN, ED    MR Angiogram Head Wo Contrast  Final Result    MR Angiogram Neck W  or Wo Contrast  Final Result    MR Brain Wo Contrast  Final Result    DG Chest 2 View  Final Result      No Follow-up on file.    Tammy Pew, MD 04/06/17 2245

## 2017-04-11 ENCOUNTER — Ambulatory Visit (HOSPITAL_COMMUNITY)
Admission: RE | Admit: 2017-04-11 | Discharge: 2017-04-11 | Disposition: A | Discharge status: Home / Self Care | Payer: PRIVATE HEALTH INSURANCE | Source: Ambulatory Visit | Attending: Oncology | Admitting: Oncology

## 2017-04-11 ENCOUNTER — Encounter: Payer: Self-pay | Admitting: Internal Medicine

## 2017-04-11 ENCOUNTER — Ambulatory Visit (INDEPENDENT_AMBULATORY_CARE_PROVIDER_SITE_OTHER): Payer: PRIVATE HEALTH INSURANCE | Admitting: Internal Medicine

## 2017-04-11 VITALS — BP 133/71 | HR 73 | Temp 97.7°F | Ht 71.0 in | Wt 288.3 lb

## 2017-04-11 DIAGNOSIS — Z79899 Other long term (current) drug therapy: Secondary | ICD-10-CM | POA: Diagnosis not present

## 2017-04-11 DIAGNOSIS — M25561 Pain in right knee: Secondary | ICD-10-CM

## 2017-04-11 DIAGNOSIS — M25562 Pain in left knee: Secondary | ICD-10-CM | POA: Insufficient documentation

## 2017-04-11 DIAGNOSIS — Z6841 Body Mass Index (BMI) 40.0 and over, adult: Secondary | ICD-10-CM

## 2017-04-11 DIAGNOSIS — G8929 Other chronic pain: Secondary | ICD-10-CM | POA: Diagnosis present

## 2017-04-11 DIAGNOSIS — M170 Bilateral primary osteoarthritis of knee: Secondary | ICD-10-CM | POA: Diagnosis not present

## 2017-04-11 MED ORDER — DICLOFENAC SODIUM 1 % TD GEL: 2.0000 g | Freq: Four times a day (QID) | TRANSDERMAL | 3 refills | Status: DC

## 2017-04-11 NOTE — Patient Instructions (Signed)
It was a pleasure to meet you today:  - START using the Voltaren Gel 1% four times per day for pain   - I have ordered X-rays of both knee. I will call you with the results.   - Continue to try to stay active. Weight loss is very beneficial for knee pain  - Please come back in 4 weeks or sooner if needed.   Knee Pain, Adult Many things can cause knee pain. The pain often goes away on its own with time and rest. If the pain does not go away, tests may be done to find out what is causing the pain. Follow these instructions at home: Activity  Rest your knee.  Do not do things that cause pain.  Avoid activities where both feet leave the ground at the same time (high-impact activities). Examples are running, jumping rope, and doing jumping jacks. General instructions  Take medicines only as told by your doctor.  Raise (elevate) your knee when you are resting. Make sure your knee is higher than your heart.  Sleep with a pillow under your knee.  If told, put ice on the knee: ? Put ice in a plastic bag. ? Place a towel between your skin and the bag. ? Leave the ice on for 20 minutes, 2-3 times a day.  Ask your doctor if you should wear an elastic knee support.  Lose weight if you are overweight. Being overweight can make your knee hurt more.  Do not use any tobacco products. These include cigarettes, chewing tobacco, or electronic cigarettes. If you need help quitting, ask your doctor. Smoking may slow down healing. Contact a doctor if:  The pain does not stop.  The pain changes or gets worse.  You have a fever along with knee pain.  Your knee gives out or locks up.  Your knee swells, and becomes worse. Get help right away if:  Your knee feels warm.  You cannot move your knee.  You have very bad knee pain.  You have chest pain.  You have trouble breathing. Summary  Many things can cause knee pain. The pain often goes away on its own with time and rest.  Avoid  activities that put stress on your knee. These include running and jumping rope.  Get help right away if you cannot move your knee, or if your knee feels warm, or if you have trouble breathing. This information is not intended to replace advice given to you by your health care provider. Make sure you discuss any questions you have with your health care provider. Document Released: 11/02/2008 Document Revised: 07/31/2016 Document Reviewed: 07/31/2016 Elsevier Interactive Patient Education  2017 [employer].

## 2017-04-11 NOTE — Assessment & Plan Note (Signed)
Continues to have bilateral knee pain that is primarily worse in the left knee today. Pain is significantly affecting her life. Review of chart illustrated ANA and CCP negative, but no recent imaging of the knees.   Left Knee X-Ray: Moderate osteoarthritic change of all 3 joint compartments Right Knee X-Ray: Moderate tricompartmental osteoarthritic change centered on the medial compartment  Physical exam, x-rays, and symptoms lead me to believe this could be osteoarthritis.   Plan: - Advised weight loss  - Continue to be physically active - Given Voltaren gel 1% 2g  QID

## 2017-04-11 NOTE — Progress Notes (Signed)
Medicine attending: I personally interviewed and briefly examined this patient on the day of the patient visit and reviewed pertinent clinical ,laboratory, and radiographic data  with resident physician Dr. Ina Homes and we discussed a management plan. I do not detect any joint effusion.  She is diffusely tender on palpation around the joint capsule.  No focal neurologic deficits.  Negative straight leg maneuver.  Reflexes absent but symmetric at the knees. She is morbidly obese.  Most likely etiology of her symptoms is a tendinitis or arthritis, more likely a combination of the 2.  X-ray result does show moderate degenerative change in that joint.  No destructive changes in the surrounding bones.  Patient concerned since a friend of hers died of bone cancer in the area of the knee.  I reassured her that this would be highly unlikely situation for her.  We will start with a trial of nonsteroidals.  If symptoms persist, then MRI scan to evaluate her tendons and menisci.

## 2017-04-11 NOTE — Progress Notes (Signed)
CC: ED follow-up and Knee pain  HPI:  [name] is a 56 y.o.   Evaluated in the ED on 8/18 for dizziness, weakness, and shortness of breath. Work-up was negative for PE. ACS work-up negative. MR of the neck and head were unremarkable and did not reveal and acute infarct or intracranial pathology. On discharge she voiced concerns about knee pain. ED provider gave a short course of hydrocodone 5-325 mg, meloxicam 7.5 mg, and a knee brace with crutches.   Today she voices concerns about left knee pain that is persistent and inhibiting much of her daily life. The pain feels stabbing in nature and sometimes radiates to her back and foot. She has not experienced any recent trauma but has noticed increased swelling of the medial aspect. Nothing appears to help with the pain and it occasionally wakes her from sleep. Reports occasional "locking" and "popping," along with the sensation of something moving in her knee. Multiple episodes of migratory join pain in the past. Self report not having RA thru prior lab testing.   Denies history of oral ulcers, photosensitivity, malar rash, seizures, or kidney problems. Is sexually active but no history of STDs.   Past Medical History:  Diagnosis Date  . Anemia 2009    Baseline hemoglobin at 11, secondary to iron deficiency  . Breast mass 02/2008    Noted on mammogram March 11, 2008 3 cm simple cyst at 9 to 10:00 position of the right breast as well as multiple other smaller simple cyst, 2.6 cm mass at the 10:00 position of the right breast also representing complex cyst,  . Deep vein thrombosis (DVT) (HCC)     history of multiple  DVTs in 1989, 1989, 2004, on chronic anticoagulation with Coumadin  . Diverticulosis    noted colonoscopy 2010  . Eczema   . Fibroid uterus  2001    status post total abdominal hysterectomy, right and left salpingo-oophorectomy , done by Dr. [name]  . Hypertension   . PE (pulmonary embolism)     history of multiple  PEs in 1984, 1989, 2004-  electronic data reviewed in [place] and EMR and I was not able to find single CT angiogram that was positive for pulmonary embolus nor was I able to find Doppler studies that were positive for DVTs  . 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  Respiratory: Negative for cough, hemoptysis and sputum production.   Cardiovascular: Negative for chest pain, palpitations, orthopnea and leg swelling.  Gastrointestinal: Negative for abdominal pain, nausea and vomiting.  Genitourinary: Negative for dysuria, frequency and urgency.  Musculoskeletal: Positive for joint pain (migratory). Negative for myalgias.  Skin: Negative for itching and rash.  Neurological: Negative.   Psychiatric/Behavioral: Negative.    Physical Exam: Vitals:   04/11/17 0943  BP: 133/71  Pulse: 73  Temp: 97.7 F (36.5 C)  TempSrc: Oral  SpO2: 100%  Weight: 288 lb 4.8 oz (130.8 kg)  Height: 5\' 11"  (1.803 m)   Physical Exam  Constitutional: She is oriented to person, place, and time and well-developed, well-nourished, and in no distress.  HENT:  Head: Normocephalic and atraumatic.  Eyes: Pupils are equal, round, and reactive to light. Conjunctivae  are normal.  Cardiovascular: Normal rate, regular rhythm, normal heart sounds and intact distal pulses.   Pulmonary/Chest: Effort normal and breath sounds normal. She has no wheezes.  Abdominal: Soft. Bowel sounds are normal. There is no tenderness. There is no rebound.  Musculoskeletal: Normal range of motion. She exhibits no edema.  Left knee: No tenderness to palpation of the patella, fibular head,   Neurological: She is  alert and oriented to person, place, and time.  Gross strength and sensation of the LE intact bilaterally. Negative straight leg raise test on right and left side  Skin: Skin is warm and dry.   Assessment & Plan:   See Encounters Tab for problem based charting.  Patient seen with Dr. Beryle Beams

## 2017-06-07 ENCOUNTER — Other Ambulatory Visit: Payer: Self-pay | Admitting: Obstetrics and Gynecology

## 2017-06-20 ENCOUNTER — Other Ambulatory Visit: Payer: Self-pay | Admitting: Radiology

## 2017-07-01 ENCOUNTER — Ambulatory Visit: Payer: Self-pay | Admitting: Surgery

## 2017-07-01 DIAGNOSIS — D241 Benign neoplasm of right breast: Secondary | ICD-10-CM

## 2017-07-01 NOTE — H&P (Signed)
[name] 07/01/2017 2:20 PM Location: [facility] Patient #: [id number] DOB: 1961/06/30 Single / Language: Cleophus Molt / Race: Black or African American Female  History of Present Illness Marcello Moores A. Harbert Fitterer MD; 07/01/2017 2:46 PM) Patient words: Patient sent at the request of Dr. Hamilton Capri for abnormal mammogram. The patient relates a history of right nipple discharge. This was clear to reddish in color. She has a family history of breast cancer. Mammography and ultrasound were done which shows a 2.8 cm oval mass right breast upper outer quadrant. Residual intraductal component and this was biopsied and shown to be papilloma. She had what appeared to be some degenerating fibroadenoma in the right breast as well. She also calcifications in the left breast that were felt to be secondary to milk of calcium. He is here today to discuss management of her right breast. Patient complains of symptoms and medical discharge. This been going on for 6 months. Discharge initially appeared benign and is nonbloody currently. She had a previous lumpectomy and left for benign disease in 2001 she thinks.                              Diagnosis Breast, right, needle core biopsy, mass, 1:00 o'clock, 5cm - INTRADUCTAL PAPILLOMA. - SEE COMMENT. Microscopic Comment The results were called to [facility] on 06/21/17. (JBK:gt, 06/21/17) Enid Cutter MD Pathologist, Electronic [employer].  The patient is a 56 year old female.   Past Surgical History Malachy Moan, [place]; 07/01/2017 2:20 PM) Breast Biopsy Right.  Diagnostic Studies History Malachy Moan, [place]; 07/01/2017 2:20 PM) Colonoscopy 5-10 years ago Mammogram within last year Pap Smear 1-5 years ago  Allergies Malachy Moan, RMA; 07/01/2017 2:21 PM) Dilaudid *ANALGESICS - OPIOID* Nausea.  Medication History Malachy Moan, RMA; 07/01/2017 2:23 PM) AmLODIPine Besylate (10MG   Tablet, Oral) Active. Aspirin EC (81MG  Tablet DR, Oral) Active. Hydrocodone-Acetaminophen (5-325MG  Tablet, Oral) Active. Meloxicam (7.5MG  Tablet, Oral) Active. Vitamin D (50000U Tablet, Oral) Active. Potassium (Oral) Specific strength unknown - Active. Calcium 600 + D (600-200MG -UNIT Tablet, Oral) Active. Medications Reconciled  Social History Malachy Moan, [place]; 07/01/2017 2:20 PM) No alcohol use No caffeine use Tobacco use Never smoker.  Family History Malachy Moan, [place]; 07/01/2017 2:20 PM) Breast Cancer Family Members In General, Sister. Cancer Family Members In General. Cervical Cancer Sister. Colon Cancer Mother. Colon Polyps Mother. Depression Brother. Heart Disease Family Members In General, Father. Hypertension Family Members In General, Father. Malignant Neoplasm Of Pancreas Family Members In General. Ovarian Cancer Sister. Prostate Cancer Family Members In General.  Pregnancy / Birth History Malachy Moan, [place]; 07/01/2017 2:20 PM) Age at menarche 15 years. Age of menopause 43-60 Gravida 3 Maternal age 39-20 Para 2  Other Problems Malachy Moan, [place]; 07/01/2017 2:20 PM) Arthritis Back Pain Depression High blood pressure Lump In Breast     Review of Systems Malachy Moan RMA; 07/01/2017 2:20 PM) General Present- Fatigue and Weight Gain. Not Present- Appetite Loss, Chills, Fever, Night Sweats and Weight Loss. HEENT Present- Oral Ulcers, Ringing in the Ears, Visual Disturbances and Wears glasses/contact lenses. Not Present- Earache, Hearing Loss, Hoarseness, Nose Bleed, Seasonal Allergies, Sinus Pain, Sore Throat and Yellow Eyes. Respiratory Not Present- Bloody sputum, Chronic Cough, Difficulty Breathing, Snoring and Wheezing. Breast Present- Nipple Discharge. Not Present- Breast Mass, Breast Pain and Skin Changes. Cardiovascular Present- Leg Cramps and Palpitations. Not Present- Chest Pain, Difficulty  Breathing Lying Down, Rapid Heart Rate, Shortness of Breath and Swelling of  Extremities. Gastrointestinal Not Present- Abdominal Pain, Bloating, Bloody Stool, Change in Bowel Habits, Chronic diarrhea, Constipation, Difficulty Swallowing, Excessive gas, Gets full quickly at meals, Hemorrhoids, Indigestion, Nausea, Rectal Pain and Vomiting. Female Genitourinary Not Present- Frequency, Nocturia, Painful Urination, Pelvic Pain and Urgency. Musculoskeletal Present- Back Pain, Joint Pain, Joint Stiffness, Muscle Pain, Muscle Weakness and Swelling of Extremities. Neurological Present- Trouble walking and Weakness. Not Present- Decreased Memory, Fainting, Headaches, Numbness, Seizures, Tingling and Tremor. Psychiatric Present- Change in Sleep Pattern. Not Present- Anxiety, Bipolar, Depression, Fearful and Frequent crying. Endocrine Not Present- Cold Intolerance, Excessive Hunger, Hair Changes, Heat Intolerance, Hot flashes and New Diabetes. Hematology Not Present- Blood Thinners, Easy Bruising, Excessive bleeding, Gland problems, HIV and Persistent Infections.  Vitals Malachy Moan RMA; 07/01/2017 2:23 PM) 07/01/2017 2:23 PM Weight: 283 lb Temp.: 98.18F  Pulse: 88 (Regular)  BP: 150/94 (Sitting, Left Arm, Standard)      Physical Exam (Kemo Spruce A. Jillianne Gamino MD; 07/01/2017 2:47 PM)  General Mental Status-Alert. General Appearance-Consistent with stated age. Hydration-Well hydrated. Voice-Normal.  Head and Neck Head-normocephalic, atraumatic with no lesions or palpable masses. Trachea-midline. Thyroid Gland Characteristics - normal size and consistency.  Chest and Lung Exam Chest and lung exam reveals -quiet, even and easy respiratory effort with no use of accessory muscles and on auscultation, normal breath sounds, no adventitious sounds and normal vocal resonance. Inspection Chest Wall - Normal. Back - normal.  Breast Note: Patient has postbiopsy changes on the  right. There is no nipple discharge standard right. Left breast shows no nipple discharge. Left breast shows scar from lumpectomy but no other masses.  Cardiovascular Cardiovascular examination reveals -normal heart sounds, regular rate and rhythm with no murmurs and normal pedal pulses bilaterally.  Neurologic Neurologic evaluation reveals -alert and oriented x 3 with no impairment of recent or remote memory. Mental Status-Normal.  Lymphatic Head & Neck  General Head & Neck Lymphatics: Bilateral - Description - Normal. Axillary  General Axillary Region: Bilateral - Description - Normal. Tenderness - Non Tender.    Assessment & Plan (Mcgwire Dasaro A. Deddrick Saindon MD; 07/01/2017 2:48 PM)  PAPILLOMA OF RIGHT BREAST (D24.1) Impression: Discussed the pros and cons of surgical excision of right breast papilloma. Discussed the technique of see localization. Discussed observation as an alternative. Risks, benefits and other options of surgery were discussed as well as nonoperative management. She has opted for right breast she localized lobectomy. Risk of lumpectomy include bleeding, infection, seroma, more surgery, use of seed/wire, wound care, cosmetic deformity and the need for other treatments, death , blood clots, death. Pt agrees to proceed.  Current Plans Pt Education - [facility] Free Text Education/Instructions: discussed with patient and provided information. Pt Education - [facility] Breast Biopsy HCI: discussed with patient and provided information. Pt Education - [facility] Breast Biopsy HCI: discussed with patient and provided information. The anatomy and the physiology was discussed. The pathophysiology and natural history of the disease was discussed. Options were discussed and recommendations were made. Technique, risks, benefits, & alternatives were discussed. Risks such as stroke, heart attack, bleeding, indection, death, and other risks discussed. Questions answered. The patient agrees to  proceed.

## 2017-07-01 NOTE — H&P (View-Only) (Signed)
[name] 07/01/2017 2:20 PM Location: [facility] Patient #: [id number] DOB: Jun 17, 1961 Single / Language: Cleophus Molt / Race: Black or African American Female  History of Present Illness Marcello Moores A. Angelys Yetman MD; 07/01/2017 2:46 PM) Patient words: Patient sent at the request of Dr. Hamilton Capri for abnormal mammogram. The patient relates a history of right nipple discharge. This was clear to reddish in color. She has a family history of breast cancer. Mammography and ultrasound were done which shows a 2.8 cm oval mass right breast upper outer quadrant. Residual intraductal component and this was biopsied and shown to be papilloma. She had what appeared to be some degenerating fibroadenoma in the right breast as well. She also calcifications in the left breast that were felt to be secondary to milk of calcium. He is here today to discuss management of her right breast. Patient complains of symptoms and medical discharge. This been going on for 6 months. Discharge initially appeared benign and is nonbloody currently. She had a previous lumpectomy and left for benign disease in 2001 she thinks.                              Diagnosis Breast, right, needle core biopsy, mass, 1:00 o'clock, 5cm - INTRADUCTAL PAPILLOMA. - SEE COMMENT. Microscopic Comment The results were called to [facility] on 06/21/17. (JBK:gt, 06/21/17) Enid Cutter MD Pathologist, [employer].  The patient is a 56 year old female.   Past Surgical History Malachy Moan, [place]; 07/01/2017 2:20 PM) Breast Biopsy Right.  Diagnostic Studies History Malachy Moan, [place]; 07/01/2017 2:20 PM) Colonoscopy 5-10 years ago Mammogram within last year Pap Smear 1-5 years ago  Allergies Malachy Moan, RMA; 07/01/2017 2:21 PM) Dilaudid *ANALGESICS - OPIOID* Nausea.  Medication History Malachy Moan, RMA; 07/01/2017 2:23 PM) AmLODIPine Besylate (10MG   Tablet, Oral) Active. Aspirin EC (81MG  Tablet DR, Oral) Active. Hydrocodone-Acetaminophen (5-325MG  Tablet, Oral) Active. Meloxicam (7.5MG  Tablet, Oral) Active. Vitamin D (50000U Tablet, Oral) Active. Potassium (Oral) Specific strength unknown - Active. Calcium 600 + D (600-200MG -UNIT Tablet, Oral) Active. Medications Reconciled  Social History Malachy Moan, [place]; 07/01/2017 2:20 PM) No alcohol use No caffeine use Tobacco use Never smoker.  Family History Malachy Moan, [place]; 07/01/2017 2:20 PM) Breast Cancer Family Members In General, Sister. Cancer Family Members In General. Cervical Cancer Sister. Colon Cancer Mother. Colon Polyps Mother. Depression Brother. Heart Disease Family Members In General, Father. Hypertension Family Members In General, Father. Malignant Neoplasm Of Pancreas Family Members In General. Ovarian Cancer Sister. Prostate Cancer Family Members In General.  Pregnancy / Birth History Malachy Moan, [place]; 07/01/2017 2:20 PM) Age at menarche 1 years. Age of menopause 22-60 Gravida 3 Maternal age 70-20 Para 2  Other Problems Malachy Moan, [place]; 07/01/2017 2:20 PM) Arthritis Back Pain Depression High blood pressure Lump In Breast     Review of Systems Malachy Moan RMA; 07/01/2017 2:20 PM) General Present- Fatigue and Weight Gain. Not Present- Appetite Loss, Chills, Fever, Night Sweats and Weight Loss. HEENT Present- Oral Ulcers, Ringing in the Ears, Visual Disturbances and Wears glasses/contact lenses. Not Present- Earache, Hearing Loss, Hoarseness, Nose Bleed, Seasonal Allergies, Sinus Pain, Sore Throat and Yellow Eyes. Respiratory Not Present- Bloody sputum, Chronic Cough, Difficulty Breathing, Snoring and Wheezing. Breast Present- Nipple Discharge. Not Present- Breast Mass, Breast Pain and Skin Changes. Cardiovascular Present- Leg Cramps and Palpitations. Not Present- Chest Pain, Difficulty  Breathing Lying Down, Rapid Heart Rate, Shortness of Breath and Swelling of  Extremities. Gastrointestinal Not Present- Abdominal Pain, Bloating, Bloody Stool, Change in Bowel Habits, Chronic diarrhea, Constipation, Difficulty Swallowing, Excessive gas, Gets full quickly at meals, Hemorrhoids, Indigestion, Nausea, Rectal Pain and Vomiting. Female Genitourinary Not Present- Frequency, Nocturia, Painful Urination, Pelvic Pain and Urgency. Musculoskeletal Present- Back Pain, Joint Pain, Joint Stiffness, Muscle Pain, Muscle Weakness and Swelling of Extremities. Neurological Present- Trouble walking and Weakness. Not Present- Decreased Memory, Fainting, Headaches, Numbness, Seizures, Tingling and Tremor. Psychiatric Present- Change in Sleep Pattern. Not Present- Anxiety, Bipolar, Depression, Fearful and Frequent crying. Endocrine Not Present- Cold Intolerance, Excessive Hunger, Hair Changes, Heat Intolerance, Hot flashes and New Diabetes. Hematology Not Present- Blood Thinners, Easy Bruising, Excessive bleeding, Gland problems, HIV and Persistent Infections.  Vitals Malachy Moan RMA; 07/01/2017 2:23 PM) 07/01/2017 2:23 PM Weight: 283 lb Temp.: 98.13F  Pulse: 88 (Regular)  BP: 150/94 (Sitting, Left Arm, Standard)      Physical Exam (Sakshi Sermons A. Roberta Angell MD; 07/01/2017 2:47 PM)  General Mental Status-Alert. General Appearance-Consistent with stated age. Hydration-Well hydrated. Voice-Normal.  Head and Neck Head-normocephalic, atraumatic with no lesions or palpable masses. Trachea-midline. Thyroid Gland Characteristics - normal size and consistency.  Chest and Lung Exam Chest and lung exam reveals -quiet, even and easy respiratory effort with no use of accessory muscles and on auscultation, normal breath sounds, no adventitious sounds and normal vocal resonance. Inspection Chest Wall - Normal. Back - normal.  Breast Note: Patient has postbiopsy changes on the  right. There is no nipple discharge standard right. Left breast shows no nipple discharge. Left breast shows scar from lumpectomy but no other masses.  Cardiovascular Cardiovascular examination reveals -normal heart sounds, regular rate and rhythm with no murmurs and normal pedal pulses bilaterally.  Neurologic Neurologic evaluation reveals -alert and oriented x 3 with no impairment of recent or remote memory. Mental Status-Normal.  Lymphatic Head & Neck  General Head & Neck Lymphatics: Bilateral - Description - Normal. Axillary  General Axillary Region: Bilateral - Description - Normal. Tenderness - Non Tender.    Assessment & Plan (Maizy Davanzo A. Meilyn Heindl MD; 07/01/2017 2:48 PM)  PAPILLOMA OF RIGHT BREAST (D24.1) Impression: Discussed the pros and cons of surgical excision of right breast papilloma. Discussed the technique of see localization. Discussed observation as an alternative. Risks, benefits and other options of surgery were discussed as well as nonoperative management. She has opted for right breast she localized lobectomy. Risk of lumpectomy include bleeding, infection, seroma, more surgery, use of seed/wire, wound care, cosmetic deformity and the need for other treatments, death , blood clots, death. Pt agrees to proceed.  Current Plans Pt Education - [facility] Free Text Education/Instructions: discussed with patient and provided information. Pt Education - [facility] Breast Biopsy HCI: discussed with patient and provided information. Pt Education - [facility] Breast Biopsy HCI: discussed with patient and provided information. The anatomy and the physiology was discussed. The pathophysiology and natural history of the disease was discussed. Options were discussed and recommendations were made. Technique, risks, benefits, & alternatives were discussed. Risks such as stroke, heart attack, bleeding, indection, death, and other risks discussed. Questions answered. The patient agrees to  proceed.

## 2017-07-04 ENCOUNTER — Other Ambulatory Visit: Payer: Self-pay

## 2017-07-04 ENCOUNTER — Encounter (HOSPITAL_BASED_OUTPATIENT_CLINIC_OR_DEPARTMENT_OTHER): Payer: Self-pay | Admitting: *Deleted

## 2017-07-08 ENCOUNTER — Encounter (HOSPITAL_BASED_OUTPATIENT_CLINIC_OR_DEPARTMENT_OTHER)
Admission: RE | Admit: 2017-07-08 | Discharge: 2017-07-08 | Disposition: A | Discharge status: Home / Self Care | Payer: PRIVATE HEALTH INSURANCE | Source: Ambulatory Visit | Attending: Surgery | Admitting: Surgery

## 2017-07-08 DIAGNOSIS — D241 Benign neoplasm of right breast: Secondary | ICD-10-CM | POA: Insufficient documentation

## 2017-07-08 LAB — CBC WITH DIFFERENTIAL/PLATELET
Basophils Absolute: 0 10*3/uL (ref 0.0–0.1)
Basophils Relative: 0 %
Eosinophils Absolute: 0.1 10*3/uL (ref 0.0–0.7)
Eosinophils Relative: 1 %
HCT: 36.2 % (ref 36.0–46.0)
Hemoglobin: 12.3 g/dL (ref 12.0–15.0)
Lymphocytes Relative: 43 %
Lymphs Abs: 2.7 10*3/uL (ref 0.7–4.0)
MCH: 28.8 pg (ref 26.0–34.0)
MCHC: 34 g/dL (ref 30.0–36.0)
MCV: 84.8 fL (ref 78.0–100.0)
Monocytes Absolute: 0.5 10*3/uL (ref 0.1–1.0)
Monocytes Relative: 8 %
Neutro Abs: 3 10*3/uL (ref 1.7–7.7)
Neutrophils Relative %: 48 %
Platelets: 299 10*3/uL (ref 150–400)
RBC: 4.27 MIL/uL (ref 3.87–5.11)
RDW: 14 % (ref 11.5–15.5)
WBC: 6.2 10*3/uL (ref 4.0–10.5)

## 2017-07-08 LAB — COMPREHENSIVE METABOLIC PANEL
ALT: 31 U/L (ref 14–54)
AST: 25 U/L (ref 15–41)
Albumin: 3.9 g/dL (ref 3.5–5.0)
Alkaline Phosphatase: 90 U/L (ref 38–126)
Anion gap: 7 (ref 5–15)
BUN: 8 mg/dL (ref 6–20)
CO2: 27 mmol/L (ref 22–32)
Calcium: 9.4 mg/dL (ref 8.9–10.3)
Chloride: 104 mmol/L (ref 101–111)
Creatinine, Ser: 0.91 mg/dL (ref 0.44–1.00)
GFR calc Af Amer: >60 mL/min (ref >60)
GFR calc non Af Amer: >60 mL/min (ref >60)
Glucose, Bld: 98 mg/dL (ref 65–99)
Potassium: 3.9 mmol/L (ref 3.5–5.1)
Sodium: 138 mmol/L (ref 135–145)
Total Bilirubin: 0.5 mg/dL (ref 0.3–1.2)
Total Protein: 7.5 g/dL (ref 6.5–8.1)

## 2017-07-08 NOTE — Progress Notes (Signed)
Ensure pre surgery drink given with instructions to complete by 0545 dos, pt verbalized understanding. 

## 2017-07-09 ENCOUNTER — Other Ambulatory Visit: Payer: Self-pay

## 2017-07-09 ENCOUNTER — Encounter (HOSPITAL_BASED_OUTPATIENT_CLINIC_OR_DEPARTMENT_OTHER): Payer: Self-pay | Admitting: Anesthesiology

## 2017-07-09 ENCOUNTER — Ambulatory Visit (HOSPITAL_BASED_OUTPATIENT_CLINIC_OR_DEPARTMENT_OTHER)
Admission: RE | Admit: 2017-07-09 | Discharge: 2017-07-09 | Disposition: A | Discharge status: Home / Self Care | Payer: Self-pay | Source: Ambulatory Visit | Attending: Surgery | Admitting: Surgery

## 2017-07-09 ENCOUNTER — Encounter (HOSPITAL_BASED_OUTPATIENT_CLINIC_OR_DEPARTMENT_OTHER)
Admission: RE | Disposition: A | Discharge status: Home / Self Care | Payer: Self-pay | Source: Ambulatory Visit | Attending: Surgery

## 2017-07-09 ENCOUNTER — Ambulatory Visit (HOSPITAL_BASED_OUTPATIENT_CLINIC_OR_DEPARTMENT_OTHER): Payer: Self-pay | Admitting: Anesthesiology

## 2017-07-09 DIAGNOSIS — Z885 Allergy status to narcotic agent status: Secondary | ICD-10-CM | POA: Insufficient documentation

## 2017-07-09 DIAGNOSIS — Z803 Family history of malignant neoplasm of breast: Secondary | ICD-10-CM | POA: Insufficient documentation

## 2017-07-09 DIAGNOSIS — D241 Benign neoplasm of right breast: Secondary | ICD-10-CM | POA: Insufficient documentation

## 2017-07-09 DIAGNOSIS — Z7982 Long term (current) use of aspirin: Secondary | ICD-10-CM | POA: Insufficient documentation

## 2017-07-09 DIAGNOSIS — M1990 Unspecified osteoarthritis, unspecified site: Secondary | ICD-10-CM | POA: Insufficient documentation

## 2017-07-09 DIAGNOSIS — Z6839 Body mass index (BMI) 39.0-39.9, adult: Secondary | ICD-10-CM | POA: Insufficient documentation

## 2017-07-09 DIAGNOSIS — Z79899 Other long term (current) drug therapy: Secondary | ICD-10-CM | POA: Insufficient documentation

## 2017-07-09 DIAGNOSIS — I10 Essential (primary) hypertension: Secondary | ICD-10-CM | POA: Insufficient documentation

## 2017-07-09 DIAGNOSIS — F329 Major depressive disorder, single episode, unspecified: Secondary | ICD-10-CM | POA: Insufficient documentation

## 2017-07-09 HISTORY — DX: Cardiac murmur, unspecified: R01.1

## 2017-07-09 HISTORY — PX: BREAST LUMPECTOMY WITH RADIOACTIVE SEED LOCALIZATION: SHX6424

## 2017-07-09 HISTORY — DX: Unspecified osteoarthritis, unspecified site: M19.90

## 2017-07-09 SURGERY — BREAST LUMPECTOMY WITH RADIOACTIVE SEED LOCALIZATION
Anesthesia: General | Site: Breast | Laterality: Right

## 2017-07-09 MED ORDER — CEFAZOLIN SODIUM-DEXTROSE 1-4 GM/50ML-% IV SOLN
INTRAVENOUS | Status: AC
  Filled 2017-07-09: qty 50

## 2017-07-09 MED ORDER — ONDANSETRON HCL 4 MG/2ML IJ SOLN
INTRAMUSCULAR | Status: AC
  Filled 2017-07-09: qty 2

## 2017-07-09 MED ORDER — DEXAMETHASONE SODIUM PHOSPHATE 4 MG/ML IJ SOLN
INTRAMUSCULAR | Status: DC | PRN
  Administered 2017-07-09: 10 mg via INTRAVENOUS

## 2017-07-09 MED ORDER — HYDROCODONE-ACETAMINOPHEN 5-325 MG PO TABS: 1.0000 | ORAL_TABLET | Freq: Four times a day (QID) | ORAL | 0 refills | Status: DC | PRN

## 2017-07-09 MED ORDER — LIDOCAINE 2% (20 MG/ML) 5 ML SYRINGE
INTRAMUSCULAR | Status: AC
  Filled 2017-07-09: qty 5

## 2017-07-09 MED ORDER — ACETAMINOPHEN 500 MG PO TABS
ORAL_TABLET | ORAL | Status: AC
  Filled 2017-07-09: qty 2

## 2017-07-09 MED ORDER — ACETAMINOPHEN 500 MG PO TABS
1000.0000 mg | ORAL_TABLET | ORAL | Status: AC
  Administered 2017-07-09: 1000 mg via ORAL

## 2017-07-09 MED ORDER — KETOROLAC TROMETHAMINE 30 MG/ML IJ SOLN: 30.0000 mg | Freq: Once | INTRAMUSCULAR | Status: DC | PRN

## 2017-07-09 MED ORDER — FENTANYL CITRATE (PF) 100 MCG/2ML IJ SOLN: 50.0000 ug | INTRAMUSCULAR | Status: DC | PRN

## 2017-07-09 MED ORDER — MIDAZOLAM HCL 2 MG/2ML IJ SOLN: 1.0000 mg | INTRAMUSCULAR | Status: DC | PRN

## 2017-07-09 MED ORDER — CELECOXIB 200 MG PO CAPS
ORAL_CAPSULE | ORAL | Status: AC
  Filled 2017-07-09: qty 1

## 2017-07-09 MED ORDER — GABAPENTIN 300 MG PO CAPS
300.0000 mg | ORAL_CAPSULE | ORAL | Status: AC
  Administered 2017-07-09: 300 mg via ORAL

## 2017-07-09 MED ORDER — DEXTROSE 5 % IV SOLN
3.0000 g | INTRAVENOUS | Status: AC
  Administered 2017-07-09: 3 g via INTRAVENOUS

## 2017-07-09 MED ORDER — IBUPROFEN 800 MG PO TABS: 800.0000 mg | ORAL_TABLET | Freq: Three times a day (TID) | ORAL | 0 refills | Status: DC | PRN

## 2017-07-09 MED ORDER — FENTANYL CITRATE (PF) 100 MCG/2ML IJ SOLN
INTRAMUSCULAR | Status: AC
  Filled 2017-07-09: qty 2

## 2017-07-09 MED ORDER — LACTATED RINGERS IV SOLN
INTRAVENOUS | Status: DC
  Administered 2017-07-09 (×2): via INTRAVENOUS

## 2017-07-09 MED ORDER — CEFAZOLIN SODIUM-DEXTROSE 2-4 GM/100ML-% IV SOLN
INTRAVENOUS | Status: AC
  Filled 2017-07-09: qty 100

## 2017-07-09 MED ORDER — CHLORHEXIDINE GLUCONATE CLOTH 2 % EX PADS: 6.0000 | MEDICATED_PAD | Freq: Once | CUTANEOUS | Status: DC

## 2017-07-09 MED ORDER — FENTANYL CITRATE (PF) 100 MCG/2ML IJ SOLN
INTRAMUSCULAR | Status: DC | PRN
Start: 2017-07-09 — End: 2017-07-09
  Administered 2017-07-09: 100 ug via INTRAVENOUS
  Administered 2017-07-09: 25 ug via INTRAVENOUS

## 2017-07-09 MED ORDER — PROPOFOL 10 MG/ML IV BOLUS
INTRAVENOUS | Status: DC | PRN
  Administered 2017-07-09: 150 mg via INTRAVENOUS

## 2017-07-09 MED ORDER — CELECOXIB 200 MG PO CAPS
200.0000 mg | ORAL_CAPSULE | ORAL | Status: AC
  Administered 2017-07-09: 200 mg via ORAL

## 2017-07-09 MED ORDER — DEXAMETHASONE SODIUM PHOSPHATE 10 MG/ML IJ SOLN
INTRAMUSCULAR | Status: AC
  Filled 2017-07-09: qty 1

## 2017-07-09 MED ORDER — MORPHINE SULFATE (PF) 2 MG/ML IV SOLN: 1.0000 mg | INTRAVENOUS | Status: DC | PRN

## 2017-07-09 MED ORDER — MIDAZOLAM HCL 2 MG/2ML IJ SOLN
INTRAMUSCULAR | Status: AC
  Filled 2017-07-09: qty 2

## 2017-07-09 MED ORDER — PROPOFOL 10 MG/ML IV BOLUS
INTRAVENOUS | Status: AC
  Filled 2017-07-09: qty 20

## 2017-07-09 MED ORDER — BUPIVACAINE-EPINEPHRINE (PF) 0.25% -1:200000 IJ SOLN
INTRAMUSCULAR | Status: DC | PRN
  Administered 2017-07-09: 10 mL

## 2017-07-09 MED ORDER — LIDOCAINE HCL (CARDIAC) 20 MG/ML IV SOLN
INTRAVENOUS | Status: DC | PRN
  Administered 2017-07-09: 100 mg via INTRAVENOUS

## 2017-07-09 MED ORDER — PROMETHAZINE HCL 25 MG/ML IJ SOLN: 6.2500 mg | INTRAMUSCULAR | Status: DC | PRN

## 2017-07-09 MED ORDER — MIDAZOLAM HCL 5 MG/5ML IJ SOLN
INTRAMUSCULAR | Status: DC | PRN
  Administered 2017-07-09: 2 mg via INTRAVENOUS

## 2017-07-09 MED ORDER — SCOPOLAMINE 1 MG/3DAYS TD PT72: 1.0000 | MEDICATED_PATCH | Freq: Once | TRANSDERMAL | Status: DC | PRN

## 2017-07-09 MED ORDER — GABAPENTIN 300 MG PO CAPS
ORAL_CAPSULE | ORAL | Status: AC
  Filled 2017-07-09: qty 1

## 2017-07-09 SURGICAL SUPPLY — 43 items
APPLIER CLIP 9.375 MED OPEN (MISCELLANEOUS) ×2
BINDER BREAST XLRG (GAUZE/BANDAGES/DRESSINGS) IMPLANT
BINDER BREAST XXLRG (GAUZE/BANDAGES/DRESSINGS) ×2 IMPLANT
BLADE SURG 15 STRL LF DISP TIS (BLADE) ×1 IMPLANT
BLADE SURG 15 STRL SS (BLADE) ×1
CANISTER SUC SOCK COL 7IN (MISCELLANEOUS) IMPLANT
CANISTER SUCT 1200ML W/VALVE (MISCELLANEOUS) IMPLANT
CHLORAPREP W/TINT 26ML (MISCELLANEOUS) ×2 IMPLANT
CLIP APPLIE 9.375 MED OPEN (MISCELLANEOUS) ×1 IMPLANT
COVER BACK TABLE 60X90IN (DRAPES) ×2 IMPLANT
COVER MAYO STAND STRL (DRAPES) ×2 IMPLANT
COVER PROBE W GEL 5X96 (DRAPES) ×2 IMPLANT
DECANTER SPIKE VIAL GLASS SM (MISCELLANEOUS) IMPLANT
DERMABOND ADVANCED (GAUZE/BANDAGES/DRESSINGS) ×1
DERMABOND ADVANCED .7 DNX12 (GAUZE/BANDAGES/DRESSINGS) ×1 IMPLANT
DEVICE DUBIN W/COMP PLATE 8390 (MISCELLANEOUS) ×2 IMPLANT
DRAPE LAPAROTOMY 100X72 PEDS (DRAPES) ×2 IMPLANT
DRAPE UTILITY XL STRL (DRAPES) ×2 IMPLANT
ELECT COATED BLADE 2.86 ST (ELECTRODE) ×2 IMPLANT
ELECT REM PT RETURN 9FT ADLT (ELECTROSURGICAL) ×2
ELECTRODE REM PT RTRN 9FT ADLT (ELECTROSURGICAL) ×1 IMPLANT
GLOVE BIOGEL PI IND STRL 8 (GLOVE) ×1 IMPLANT
GLOVE BIOGEL PI INDICATOR 8 (GLOVE) ×1
GLOVE ECLIPSE 8.0 STRL XLNG CF (GLOVE) ×2 IMPLANT
GOWN STRL REUS W/ TWL LRG LVL3 (GOWN DISPOSABLE) ×2 IMPLANT
GOWN STRL REUS W/TWL LRG LVL3 (GOWN DISPOSABLE) ×2
HEMOSTAT ARISTA ABSORB 3G PWDR (MISCELLANEOUS) IMPLANT
HEMOSTAT SNOW SURGICEL 2X4 (HEMOSTASIS) IMPLANT
KIT MARKER MARGIN INK (KITS) ×2 IMPLANT
NEEDLE HYPO 25X1 1.5 SAFETY (NEEDLE) ×2 IMPLANT
NS IRRIG 1000ML POUR BTL (IV SOLUTION) ×2 IMPLANT
PACK BASIN DAY SURGERY FS (CUSTOM PROCEDURE TRAY) ×2 IMPLANT
PENCIL BUTTON HOLSTER BLD 10FT (ELECTRODE) ×2 IMPLANT
SLEEVE SCD COMPRESS KNEE MED (MISCELLANEOUS) ×2 IMPLANT
SPONGE LAP 4X18 X RAY DECT (DISPOSABLE) ×2 IMPLANT
SUT MNCRL AB 4-0 PS2 18 (SUTURE) ×2 IMPLANT
SUT SILK 2 0 SH (SUTURE) IMPLANT
SUT VICRYL 3-0 CR8 SH (SUTURE) ×2 IMPLANT
SYR CONTROL 10ML LL (SYRINGE) ×2 IMPLANT
TOWEL OR 17X24 6PK STRL BLUE (TOWEL DISPOSABLE) ×2 IMPLANT
TOWEL OR NON WOVEN STRL DISP B (DISPOSABLE) ×2 IMPLANT
TUBE CONNECTING 20X1/4 (TUBING) IMPLANT
YANKAUER SUCT BULB TIP NO VENT (SUCTIONS) IMPLANT

## 2017-07-09 NOTE — Discharge Instructions (Signed)
[facility] °Office Phone Number [phone] ° °BREAST BIOPSY/ PARTIAL MASTECTOMY: POST OP INSTRUCTIONS ° °Always review your discharge instruction sheet given to you by the facility where your surgery was performed. ° °IF YOU HAVE DISABILITY OR FAMILY LEAVE FORMS, YOU MUST BRING THEM TO THE OFFICE FOR PROCESSING.  DO NOT GIVE THEM TO YOUR DOCTOR. ° °1. A prescription for pain medication may be given to you upon discharge.  Take your pain medication as prescribed, if needed.  If narcotic pain medicine is not needed, then you may take acetaminophen (Tylenol) or ibuprofen (Advil) as needed. °2. Take your usually prescribed medications unless otherwise directed °3. If you need a refill on your pain medication, please contact your pharmacy.  They will contact our office to request authorization.  Prescriptions will not be filled after 5pm or on week-ends. °4. You should eat very light the first 24 hours after surgery, such as soup, crackers, pudding, etc.  Resume your normal diet the day after surgery. °5. Most patients will experience some swelling and bruising in the breast.  Ice packs and a good support bra will help.  Swelling and bruising can take several days to resolve.  °6. It is common to experience some constipation if taking pain medication after surgery.  Increasing fluid intake and taking a stool softener will usually help or prevent this problem from occurring.  A mild laxative (Milk of Magnesia or Miralax) should be taken according to package directions if there are no bowel movements after 48 hours. °7. Unless discharge instructions indicate otherwise, you may remove your bandages 24-48 hours after surgery, and you may shower at that time.  You may have steri-strips (small skin tapes) in place directly over the incision.  These strips should be left on the skin for 7-10 days.  If your surgeon used skin glue on the incision, you may shower in 24 hours.  The glue will flake off over the  next 2-3 weeks.  Any sutures or staples will be removed at the office during your follow-up visit. °8. ACTIVITIES:  You may resume regular daily activities (gradually increasing) beginning the next day.  Wearing a good support bra or sports bra minimizes pain and swelling.  You may have sexual intercourse when it is comfortable. °a. You may drive when you no longer are taking prescription pain medication, you can comfortably wear a seatbelt, and you can safely maneuver your car and apply brakes. °b. RETURN TO WORK:  ______________________________________________________________________________________ °9. You should see your doctor in the office for a follow-up appointment approximately two weeks after your surgery.  Your doctor’s nurse will typically make your follow-up appointment when she calls you with your pathology report.  Expect your pathology report 2-3 business days after your surgery.  You may call to check if you do not hear from us after three days. °10. OTHER INSTRUCTIONS: _______________________________________________________________________________________________ _____________________________________________________________________________________________________________________________________ °_____________________________________________________________________________________________________________________________________ °_____________________________________________________________________________________________________________________________________ ° °WHEN TO CALL YOUR DOCTOR: °1. Fever over 101.0 °2. Nausea and/or vomiting. °3. Extreme swelling or bruising. °4. Continued bleeding from incision. °5. Increased pain, redness, or drainage from the incision. ° °The clinic staff is available to answer your questions during regular business hours.  Please don’t hesitate to call and ask to speak to one of the nurses for clinical concerns.  If you have a medical emergency, go to the nearest  emergency room or call 911.  A surgeon from [facility] is always on call at the hospital. ° °For further questions, please visit [email or web address]  ° ° ° ° °  Post Anesthesia Home Care Instructions ° °Activity: °Get plenty of rest for the remainder of the day. A responsible individual must stay with you for 24 hours following the procedure.  °For the next 24 hours, DO NOT: °-Drive a car °-Operate machinery °-Drink alcoholic beverages °-Take any medication unless instructed by your physician °-Make any legal decisions or sign important papers. ° °Meals: °Start with liquid foods such as gelatin or soup. Progress to regular foods as tolerated. Avoid greasy, spicy, heavy foods. If nausea and/or vomiting occur, drink only clear liquids until the nausea and/or vomiting subsides. Call your physician if vomiting continues. ° °Special Instructions/Symptoms: °Your throat may feel dry or sore from the anesthesia or the breathing tube placed in your throat during surgery. If this causes discomfort, gargle with warm salt water. The discomfort should disappear within 24 hours. ° °If you had a scopolamine patch placed behind your ear for the management of post- operative nausea and/or vomiting: ° °1. The medication in the patch is effective for 72 hours, after which it should be removed.  Wrap patch in a tissue and discard in the trash. Wash hands thoroughly with soap and water. °2. You may remove the patch earlier than 72 hours if you experience unpleasant side effects which may include dry mouth, dizziness or visual disturbances. °3. Avoid touching the patch. Wash your hands with soap and water after contact with the patch. °  ° °

## 2017-07-09 NOTE — Interval H&P Note (Signed)
History and Physical Interval Note:  07/09/2017 8:59 AM  [name]  has presented today for surgery, with the diagnosis of RIGHT BREAST PAPILLOMA  The various methods of treatment have been discussed with the patient and family. After consideration of risks, benefits and other options for treatment, the patient has consented to  Procedure(s): RIGHT BREAST LUMPECTOMY WITH RADIOACTIVE SEED LOCALIZATION (Right) as a surgical intervention .  The patient's history has been reviewed, patient examined, no change in status, stable for surgery.  I have reviewed the patient's chart and labs.  Questions were answered to the patient's satisfaction.     [place]

## 2017-07-09 NOTE — Anesthesia Postprocedure Evaluation (Signed)
Anesthesia Post Note  Patient: [name]  Procedure(s) Performed: RIGHT BREAST LUMPECTOMY WITH RADIOACTIVE SEED LOCALIZATION (Right Breast)     Patient location during evaluation: PACU Anesthesia Type: General Level of consciousness: awake and alert Pain management: pain level controlled Vital Signs Assessment: post-procedure vital signs reviewed and stable Respiratory status: spontaneous breathing, nonlabored ventilation, respiratory function stable and patient connected to nasal cannula oxygen Cardiovascular status: blood pressure returned to baseline and stable Postop Assessment: no apparent nausea or vomiting Anesthetic complications: no    Last Vitals:  Vitals:   07/09/17 1045 07/09/17 1100  BP: 128/84 123/83  Pulse: 74 71  Resp: 15 17  Temp:    SpO2: 100% 100%    Last Pain:  Vitals:   07/09/17 1100  TempSrc:   PainSc: 0-No pain                 [place]

## 2017-07-09 NOTE — Op Note (Signed)
Preoperative diagnosis: Right  breast papilloma  Postoperative diagnosis: Same   Procedure: right  breast seed localized lumpectomy  Surgeon: Erroll Luna M.D.  Anesthesia: Gen. With 0.25% Sensorcaine local  EBL: 20 cc  Specimen: breast tissue with clip and radioactive seed in the specimen. Verified with neoprobe and radiographic image showing both seed and clip in specimen  Indications for procedure: The patient presents for right breast  lumpectomy after core biopsy showed papilloma. Discussed the rationale for considering excision. Small risk of malignancy associated with papilloma lesion after core biopsy. Discussed observation. Discussed wire/ seed localization. Patient desired excision of  Papilloma. The procedure has been discussed with the patient. Alternatives to surgery have been discussed with the patient.  Risks of surgery include bleeding,  Infection,  Seroma formation, death, cosmetic issues   and the need for further surgery. The procedure has been discussed with the patient.  Alternative therapies have been discussed with the patient.  Operative risks include bleeding,  Infection,  Organ injury,  Nerve injury,  Blood vessel injury,  DVT,  Pulmonary embolism,  Death,  And possible reoperation.  Medical management risks include worsening of present situation.  The success of the procedure is 50 -90 % at treating patients symptoms.  The patient understands and agrees to proceed.  The patient understands and wishes to proceed.   Description of procedure: Patient underwent seed placement as an outpatient. Patient presents today for right  breast seed localized lumpectomy. Patient seen in the holding area. Questions are answered and neoprobe used to verify seed location. Patient taken back to the operating room and placed upon the OR table. After induction of general anesthesia, right breast prepped and draped in a sterile fashion. Timeout was done to verify proper side /  procedure.  Neoprobe used and hot spot identified in the right  breast upper-outer quadrant. This was marked with pen. Curvilinear incision made in the superior border NAC.  Dissection used with the help of a neoprobe around the tissue where the seed and clip were located. Tissue removed in its entirety with gross NEGATIVE  margins.Marlis Edelson used and seed / clip  within specimen. Radiographs taken which show clip and seed in specimen. Clips placed in the cavity.  Hemostasis achieved and cavity closed with 3-0 Vicryl and 4-0 Monocryl. Dermabond applied. All final counts found to be correct. Specimen transported to pathology. Patient awoke extubated taken to recovery in satisfactory condition.

## 2017-07-09 NOTE — Transfer of Care (Signed)
Immediate Anesthesia Transfer of Care Note  Patient: [name]  Procedure(s) Performed: RIGHT BREAST LUMPECTOMY WITH RADIOACTIVE SEED LOCALIZATION (Right Breast)  Patient Location: PACU  Anesthesia Type:General  Level of Consciousness: sedated  Airway & Oxygen Therapy: Patient Spontanous Breathing and Patient connected to face mask oxygen  Post-op Assessment: Report given to RN and Post -op Vital signs reviewed and stable  Post vital signs: Reviewed and stable  Last Vitals:  Vitals:   07/09/17 0820  BP: 128/75  Pulse: 78  Resp: 20  Temp: 36.8 C  SpO2: 99%    Last Pain:  Vitals:   07/09/17 0820  TempSrc: Oral         Complications: No apparent anesthesia complications

## 2017-07-09 NOTE — Anesthesia Preprocedure Evaluation (Signed)
Anesthesia Evaluation  Patient identified by MRN, date of birth, ID band Patient awake    Reviewed: Allergy & Precautions, NPO status , Patient's Chart, lab work & pertinent test results  Airway Mallampati: II  TM Distance: >3 FB Neck ROM: Full    Dental no notable dental hx.    Pulmonary neg pulmonary ROS,    Pulmonary exam normal breath sounds clear to auscultation       Cardiovascular hypertension, Normal cardiovascular exam Rhythm:Regular Rate:Normal     Neuro/Psych negative neurological ROS  negative psych ROS   GI/Hepatic negative GI ROS, Neg liver ROS,   Endo/Other  Morbid obesity  Renal/GU negative Renal ROS  negative genitourinary   Musculoskeletal negative musculoskeletal ROS (+)   Abdominal   Peds negative pediatric ROS (+)  Hematology negative hematology ROS (+)   Anesthesia Other Findings   Reproductive/Obstetrics negative OB ROS                             Anesthesia Physical Anesthesia Plan  ASA: III  Anesthesia Plan: General   Post-op Pain Management:    Induction: Intravenous  PONV Risk Score and Plan: 3 and Ondansetron, Dexamethasone and Treatment may vary due to age or medical condition  Airway Management Planned: LMA  Additional Equipment:   Intra-op Plan:   Post-operative Plan: Extubation in OR  Informed Consent: I have reviewed the patients History and Physical, chart, labs and discussed the procedure including the risks, benefits and alternatives for the proposed anesthesia with the patient or authorized representative who has indicated his/her understanding and acceptance.     Dental advisory given  Plan Discussed with: CRNA and Surgeon  Anesthesia Plan Comments:         Anesthesia Quick Evaluation  

## 2017-07-09 NOTE — Anesthesia Procedure Notes (Signed)
Procedure Name: LMA Insertion Date/Time: 07/09/2017 9:36 AM Performed by: Marrianne Mood, CRNA Pre-anesthesia Checklist: Patient identified, Emergency Drugs available, Suction available, Patient being monitored and Timeout performed Patient Re-evaluated:Patient Re-evaluated prior to induction Oxygen Delivery Method: Circle system utilized Preoxygenation: Pre-oxygenation with 100% oxygen Induction Type: IV induction Ventilation: Mask ventilation without difficulty LMA: LMA inserted LMA Size: 4.0 Number of attempts: 1 Airway Equipment and Method: Bite block Placement Confirmation: positive ETCO2 Tube secured with: Tape Dental Injury: Teeth and Oropharynx as per pre-operative assessment

## 2017-07-10 ENCOUNTER — Encounter (HOSPITAL_BASED_OUTPATIENT_CLINIC_OR_DEPARTMENT_OTHER): Payer: Self-pay | Admitting: Surgery

## 2017-09-05 ENCOUNTER — Ambulatory Visit (INDEPENDENT_AMBULATORY_CARE_PROVIDER_SITE_OTHER): Payer: Self-pay | Admitting: Internal Medicine

## 2017-09-05 ENCOUNTER — Encounter: Payer: Self-pay | Admitting: Internal Medicine

## 2017-09-05 VITALS — BP 138/84 | HR 73 | Temp 98.2°F | Ht 71.0 in | Wt 292.0 lb

## 2017-09-05 DIAGNOSIS — M25552 Pain in left hip: Secondary | ICD-10-CM

## 2017-09-05 DIAGNOSIS — G8929 Other chronic pain: Secondary | ICD-10-CM

## 2017-09-05 DIAGNOSIS — M25561 Pain in right knee: Secondary | ICD-10-CM

## 2017-09-05 DIAGNOSIS — M1712 Unilateral primary osteoarthritis, left knee: Secondary | ICD-10-CM

## 2017-09-05 DIAGNOSIS — M25562 Pain in left knee: Secondary | ICD-10-CM

## 2017-09-05 MED ORDER — DICLOFENAC SODIUM 50 MG PO TBEC: 50.0000 mg | DELAYED_RELEASE_TABLET | Freq: Two times a day (BID) | ORAL | 0 refills | Status: DC

## 2017-09-05 NOTE — Progress Notes (Signed)
   CC: Left sided hip pain   HPI:  [name] is a 57 y.o. female with history noted below that presents to the acute care clinic for left-sided hip pain. Please see problem based charting for the status of patient's chronic medical condition.  Past Medical History:  Diagnosis Date  . Anemia 2009    Baseline hemoglobin at 11, secondary to iron deficiency  . Arthritis   . Breast mass 02/2008    Noted on mammogram March 11, 2008 3 cm simple cyst at 9 to 10:00 position of the right breast as well as multiple other smaller simple cyst, 2.6 cm mass at the 10:00 position of the right breast also representing complex cyst,  . Deep vein thrombosis (DVT) (HCC)     history of multiple  DVTs in 1989, 1989, 2004, on chronic anticoagulation with Coumadin  . Diverticulosis    noted colonoscopy 2010  . Eczema   . Fibroid uterus  2001    status post total abdominal hysterectomy, right and left salpingo-oophorectomy , done by Dr. [name]  . Heart murmur    as a child  . Hypertension   . PE (pulmonary embolism)     history of multiple PEs in 1984, 1989, 2004-  electronic data reviewed in [facility] and EMR and I was not able to find single CT angiogram that was positive for pulmonary embolus nor was I able to find Doppler studies that were positive for DVTs  . 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  Musculoskeletal: Negative for falls.  Skin: Negative for rash.  Neurological: Negative for focal weakness and weakness.     Physical Exam:  Vitals:   09/05/17 1103  BP: 138/84  Pulse: 73  Temp: 98.2 F (36.8 C)  TempSrc: Oral  SpO2: 98%  Weight: 292 lb (132.5 kg)  Height: 5'  11" (1.803 m)   Physical Exam  Constitutional: She is well-developed, well-nourished, and in no distress.  Musculoskeletal: She exhibits no edema.  Negative straight leg raise Limps to the left side when walking  Neurological:  5/5 motor strength in lower extremities bilaterally   Skin: Skin is warm and dry.    Assessment & Plan:   See encounters tab for problem based medical decision making.   Patient discussed with Dr. Eppie Gibson

## 2017-09-05 NOTE — Patient Instructions (Signed)
Ms. Nilan,  Is a pleasure meeting you today. Please start taking diclofenac 2 pills a day to help with your pain. Please follow up with orthopedic surgery to discuss your knee pain for possible steroid injection.

## 2017-09-06 DIAGNOSIS — M25552 Pain in left hip: Secondary | ICD-10-CM | POA: Insufficient documentation

## 2017-09-06 NOTE — Assessment & Plan Note (Addendum)
Assessment: Osteoarthritis of left knee Patient complaining of left knee pain.  Denies trauma to the area.  States the pain is chronic and unchanged.  Patient had a left knee x-ray that showed moderate osteoarthritic changes of all 3 joint compartments. Patient states she has seen orthopedics in the past. I recommend she follow-up to get a steroid injection. Will also treat with diclofenac.  Plan -Diclofenac -Steroid injection with orthopedic surgery

## 2017-09-06 NOTE — Progress Notes (Signed)
Patient ID: [name], female   DOB: [birth year], 59 y.o.   MRN: [id number]  Case discussed with Dr. Young Berry at the time of the visit.  We reviewed the resident's history and exam and pertinent patient test results.  I agree with the assessment, diagnosis and plan of care documented in the resident's note.  It appears she did not stop for her hip X-ray as requested.

## 2017-09-06 NOTE — Assessment & Plan Note (Signed)
Assessment:  Left hip pain Patient states she has a 2 month history of lateral and posterior hip pain.  Patient states the pain is dull and constant.  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  She uses a cane outside of work.  She denies radicular pain or weakness.  Patient's pain could be arthritic.  Will obtain a left hip x-ray and try a course of diclofenac for symptom relief.    Plan -diclofenac -left hip x-ray

## 2017-09-10 ENCOUNTER — Ambulatory Visit (HOSPITAL_COMMUNITY)
Admission: RE | Admit: 2017-09-10 | Discharge: 2017-09-10 | Disposition: A | Discharge status: Home / Self Care | Payer: PRIVATE HEALTH INSURANCE | Source: Ambulatory Visit | Attending: Internal Medicine | Admitting: Internal Medicine

## 2017-09-10 DIAGNOSIS — M25552 Pain in left hip: Secondary | ICD-10-CM | POA: Insufficient documentation

## 2017-10-14 ENCOUNTER — Other Ambulatory Visit: Payer: Self-pay | Admitting: Internal Medicine

## 2017-12-19 ENCOUNTER — Ambulatory Visit (INDEPENDENT_AMBULATORY_CARE_PROVIDER_SITE_OTHER): Payer: Self-pay | Admitting: Internal Medicine

## 2017-12-19 ENCOUNTER — Other Ambulatory Visit: Payer: Self-pay

## 2017-12-19 VITALS — BP 131/77 | HR 72 | Temp 97.8°F | Ht 71.0 in | Wt 288.8 lb

## 2017-12-19 DIAGNOSIS — Z9181 History of falling: Secondary | ICD-10-CM

## 2017-12-19 DIAGNOSIS — M25569 Pain in unspecified knee: Secondary | ICD-10-CM

## 2017-12-19 DIAGNOSIS — Z79899 Other long term (current) drug therapy: Secondary | ICD-10-CM

## 2017-12-19 DIAGNOSIS — M6281 Muscle weakness (generalized): Secondary | ICD-10-CM

## 2017-12-19 DIAGNOSIS — G894 Chronic pain syndrome: Secondary | ICD-10-CM

## 2017-12-19 DIAGNOSIS — M542 Cervicalgia: Secondary | ICD-10-CM

## 2017-12-19 DIAGNOSIS — M25552 Pain in left hip: Secondary | ICD-10-CM

## 2017-12-19 DIAGNOSIS — G8929 Other chronic pain: Secondary | ICD-10-CM

## 2017-12-19 DIAGNOSIS — M25351 Other instability, right hip: Secondary | ICD-10-CM | POA: Insufficient documentation

## 2017-12-19 DIAGNOSIS — R490 Dysphonia: Secondary | ICD-10-CM | POA: Insufficient documentation

## 2017-12-19 DIAGNOSIS — M25561 Pain in right knee: Principal | ICD-10-CM

## 2017-12-19 DIAGNOSIS — M25562 Pain in left knee: Principal | ICD-10-CM

## 2017-12-19 DIAGNOSIS — M25352 Other instability, left hip: Secondary | ICD-10-CM

## 2017-12-19 MED ORDER — GABAPENTIN 100 MG PO CAPS: 100.0000 mg | ORAL_CAPSULE | Freq: Two times a day (BID) | ORAL | 0 refills | Status: DC

## 2017-12-19 MED ORDER — IBUPROFEN 800 MG PO TABS: 800.0000 mg | ORAL_TABLET | Freq: Three times a day (TID) | ORAL | 0 refills | Status: DC | PRN

## 2017-12-19 NOTE — Patient Instructions (Addendum)
FOLLOW-UP INSTRUCTIONS When: With your PCP or if the symptoms worsen For: Routine or if symptoms worsen What to bring: All of your medications  Thank you for your visit to [facility]  I have placed a referral to the ear nose and throat doctor to evaluate your hoarseness and throat pain.   With regard to the hip/knee pain I have placed a referral to physical therapy. Please continue to use the ibuprofen and water aerobics at a local [facility].

## 2017-12-19 NOTE — Assessment & Plan Note (Signed)
Will utilize a short course of gabapentin to assist with her pain.

## 2017-12-19 NOTE — Assessment & Plan Note (Signed)
   Dysphonia: Worsened by prolonged talking, excitement loud vocal sounds, singing, but denied a history of tobacco use. She described a throat irritation, almost a swelling but denied shortness of breath, heart palpations and ability to swallow her saliva, cough, sputum production, chest pain, or neck pain.  There is notable hoarseness associated with the start sensation.  There is no evidence of erythema, mass, asymmetry of the oropharynx, with good visual visualization of the posterior tongue, and oral pharynx on physical exam.  There is no notable thyromegaly, lymphadenopathy in the neck or supraclavicularly.  She denies smoking history.  I do not feel that imaging is indicated at this time  Plan: ENT referral for evaluation of her throat sensation and dysphonia.

## 2017-12-19 NOTE — Progress Notes (Signed)
CC: Balance concerns  HPI:[name] is a 57 y.o. concern for tripping and hoarseness.  Please see individual problem based assessment and plan for additional details  Instability due to muscle weakness and lower extremity pain: Neck pain and balance issues x 2 weeks more acutely worsened although present for months. Unable to walk without fear of tripping and falling due to pain. Has seen an orthopedic surgeon who prescribed Vit D, informed her that knees worse than hips, and that they will attempt to "build up the bones" as she prefers to avoid surgery.  Slowly progressing, intermittent. Described as tripping but not as if she has lost balance. She stated that it was as if her legs simply give out, but denied dizziness, syncope, presyncope, preceding events or symptoms.  Has chronic hip pain and knee pain Worsened by: nothing Improved with: nothing Associated with: occasional stumbling, fell once in last August due to leg weakness, occasional stuttering? Denied: visual changes other than believing she needs new glasses, denied LOC. Given her lack of acute weakness, numbness or sensation deficits concerning for nerve impingement I do not feel that imaging or steroids are currently indicated. She would benefit from physical therapy and repeat evaluation by orthopedic surgery.   Plan: Physical Therapy Consider imaging in the future if her pain persists. I do not feel imaging is indicated at this time but there does not appear to be an acute change since onset of symptoms or does not appear to be any acute nerve impingement. I recommended water therapy and ibuprofen 800 mg 3 times daily for 5 days to help with the pain.   Dysphonia: Worsened by prolonged talking, excitement loud vocal sounds, singing, but denied a history of tobacco use. She described a throat irritation, almost a swelling but denied shortness of breath, heart palpations and ability to swallow her saliva, cough,  sputum production, chest pain, or neck pain.  There is notable hoarseness associated with the start sensation.  There is no evidence of erythema, mass, asymmetry of the oropharynx, with good visual visualization of the posterior tongue, and oral pharynx on physical exam.  There is no notable thyromegaly, lymphadenopathy in the neck or supraclavicularly.  She denies smoking history.  I do not feel that imaging is indicated at this time  Plan: ENT referral for evaluation of her throat sensation and dysphonia.   Past Medical History:  Diagnosis Date  . Anemia 2009    Baseline hemoglobin at 11, secondary to iron deficiency  . Arthritis   . Breast mass 02/2008    Noted on mammogram March 11, 2008 3 cm simple cyst at 9 to 10:00 position of the right breast as well as multiple other smaller simple cyst, 2.6 cm mass at the 10:00 position of the right breast also representing complex cyst,  . Deep vein thrombosis (DVT) (HCC)     history of multiple  DVTs in 1989, 1989, 2004, on chronic anticoagulation with Coumadin  . Diverticulosis    noted colonoscopy 2010  . Eczema   . Fibroid uterus  2001    status post total abdominal hysterectomy, right and left salpingo-oophorectomy , done by Dr. [name]  . Heart murmur    as a child  . Hypertension   . PE (pulmonary embolism)     history of multiple PEs in 1984, 1989, 2004-  electronic data reviewed in [facility] and EMR and I was not able to find single CT angiogram that was positive for pulmonary embolus nor was  I able to find Doppler studies that were positive for DVTs  . 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  Physical Exam:  Vitals:   12/19/17 0946  BP: 131/77  Pulse:  72  Temp: 97.8 F (36.6 C)  TempSrc: Oral  SpO2: 98%  Weight: 288 lb 12.8 oz (131 kg)  Height: 5\' 11"  (1.803 m)   General: No acute distress, alert, oriented, afebrile, nondiaphoretic, conversant Cardiovascular: RRR, no murmurs rubs or gallops Pulmonary: Lungs clear to all station bilaterally GI: Abdomen is soft, nontender, nondistended Muscular skeletal: Strength is 5 out of 5 symmetrically in the upper extremities and 5 out of 5 symmetrically in the lower extremities with subjective decreased due to pain in the left lower extremity.  Patient's gait was not abnormal, and finger-nose was intact  Assessment & Plan:   See Encounters Tab for problem based charting.  Patient discussed with Dr. Beryle Beams

## 2017-12-19 NOTE — Assessment & Plan Note (Signed)
Instability due to muscle weakness and lower extremity pain: Neck pain and balance issues x 2 weeks more acutely worsened although present for months. Unable to walk without fear of tripping and falling due to pain. Has seen an orthopedic surgeon who prescribed Vit D, informed her that knees worse than hips, and that they will attempt to "build up the bones" as she prefers to avoid surgery.  Slowly progressing, intermittent. Described as tripping but not as if she has lost balance. She stated that it was as if her legs simply give out, but denied dizziness, syncope, presyncope, preceding events or symptoms.  Has chronic hip pain and knee pain Worsened by: nothing Improved with: nothing Associated with: occasional stumbling, fell once in last August due to leg weakness, occasional stuttering? Denied: visual changes other than believing she needs new glasses, denied LOC. Given her lack of acute weakness, numbness or sensation deficits concerning for nerve impingement I do not feel that imaging or steroids are currently indicated. She would benefit from physical therapy and repeat evaluation by orthopedic surgery.   Plan: Physical Therapy Consider imaging in the future if her pain persists. I do not feel imaging is indicated at this time but there does not appear to be an acute change since onset of symptoms or does not appear to be any acute nerve impingement. I recommended water therapy and ibuprofen 800 mg 3 times daily for 5 days to help with the pain.

## 2017-12-22 NOTE — Progress Notes (Signed)
Medicine attending: Medical history, presenting problems, physical findings, and medications, reviewed with resident physician Dr Lawrence Harbrecht on the day of the patient visit and I concur with his evaluation and management plan. 

## 2017-12-23 ENCOUNTER — Other Ambulatory Visit: Payer: Self-pay

## 2017-12-29 ENCOUNTER — Other Ambulatory Visit: Payer: Self-pay | Admitting: Internal Medicine

## 2017-12-30 ENCOUNTER — Encounter: Payer: Self-pay | Admitting: *Deleted

## 2017-12-31 ENCOUNTER — Encounter: Payer: Self-pay | Admitting: *Deleted

## 2018-01-07 ENCOUNTER — Ambulatory Visit: Payer: Medicaid Other | Attending: Oncology | Admitting: Physical Therapy

## 2018-01-07 ENCOUNTER — Other Ambulatory Visit: Payer: Self-pay

## 2018-01-07 ENCOUNTER — Encounter: Payer: Self-pay | Admitting: Physical Therapy

## 2018-01-07 DIAGNOSIS — G8929 Other chronic pain: Secondary | ICD-10-CM | POA: Diagnosis present

## 2018-01-07 DIAGNOSIS — M25561 Pain in right knee: Secondary | ICD-10-CM | POA: Insufficient documentation

## 2018-01-07 DIAGNOSIS — M6281 Muscle weakness (generalized): Secondary | ICD-10-CM | POA: Diagnosis not present

## 2018-01-07 DIAGNOSIS — M25562 Pain in left knee: Secondary | ICD-10-CM | POA: Diagnosis present

## 2018-01-07 DIAGNOSIS — M25662 Stiffness of left knee, not elsewhere classified: Secondary | ICD-10-CM | POA: Diagnosis present

## 2018-01-07 DIAGNOSIS — M25552 Pain in left hip: Secondary | ICD-10-CM | POA: Insufficient documentation

## 2018-01-07 NOTE — Therapy (Signed)
[facility] [address], [place], [place], [address] Phone: [phone]   Fax:  [phone]  Physical Therapy Evaluation  Patient Details  Name: [name] MRN: [id number] Date of Birth: Oct 07, 1960 Referring Provider: Kathi Ludwig, MD   Encounter Date: 01/07/2018  PT End of Session - 01/07/18 1724    Visit Number  1    Date for PT Re-Evaluation  04/01/18    PT Start Time  7062    PT Stop Time  0917    PT Time Calculation (min)  30 min    Activity Tolerance  Patient tolerated treatment well    Behavior During Therapy  [facility] for tasks assessed/performed       Past Medical History:  Diagnosis Date  . Anemia 2009    Baseline hemoglobin at 11, secondary to iron deficiency  . Arthritis   . Breast mass 02/2008    Noted on mammogram March 11, 2008 3 cm simple cyst at 9 to 10:00 position of the right breast as well as multiple other smaller simple cyst, 2.6 cm mass at the 10:00 position of the right breast also representing complex cyst,  . Deep vein thrombosis (DVT) (HCC)     history of multiple  DVTs in 1989, 1989, 2004, on chronic anticoagulation with Coumadin  . Diverticulosis    noted colonoscopy 2010  . Eczema   . Fibroid uterus  2001    status post total abdominal hysterectomy, right and left salpingo-oophorectomy , done by Dr. [name]  . Heart murmur    as a child  . Hypertension   . PE (pulmonary embolism)     history of multiple PEs in 1984, 1989, 2004-  electronic data reviewed in [place] and EMR and I was not able to find single CT angiogram that was positive for pulmonary embolus nor was I able to find Doppler studies that were positive for DVTs  . 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 ABDOMINAL HYSTERECTOMY  04/13/2010   done by Dr. [name]  . BREAST LUMPECTOMY WITH RADIOACTIVE SEED LOCALIZATION Right 07/09/2017   Procedure: RIGHT BREAST LUMPECTOMY WITH RADIOACTIVE SEED LOCALIZATION;  Surgeon: Erroll Luna, MD;  Location: [place];  Service: General;  Laterality: Right;  . CHOLECYSTECTOMY     1997  . MASTECTOMY, PARTIAL  03/2006    left partial mastectomy secondary to fibrocystic disease intraluminal fibroadenoma performed March 28, 2006 done by Dr. [name]    There were no vitals filed for this visit.   Subjective Assessment - 01/07/18 0851    Subjective  Pt states hip and knee especially left and sometimes into feet.      Limitations  Walking    How long can you sit comfortably?  30 minutes of sitting makes it hard to get up    How long can you stand comfortably?  1-2 hours    How long can you walk comfortably?  over an hour    Patient Stated Goals  feeling better when walking    Currently in Pain?  Yes    Pain Score  7     Pain Location  Hip    Pain Orientation  Left    Pain Descriptors /  Indicators  Burning    Pain Type  Chronic pain    Pain Onset  More than a month ago    Pain Frequency  Constant    Aggravating Factors   doing too much weight bearing    Pain Relieving Factors  nothing         [facility] PT Assessment - 01/07/18 0001      Assessment   Medical Diagnosis  M25.561,M25.562,G89.29 (ICD-10-CM) - Chronic pain of both knees;G89.4 (ICD-10-CM) - Chronic pain syndrome;M25.552 (ICD-10-CM) - Left hip pain    Referring Provider  Kathi Ludwig, MD    Onset Date/Surgical Date  -- chronic    Prior Therapy  No      Precautions   Precautions  None      Restrictions   Weight Bearing Restrictions  No      Balance Screen   Has the patient fallen in the past 6 months  No "stumbling a lot but catch myself"      [place] residence    Living Arrangements  Alone      Prior Function   Level of Independence  Independent    Vocation  Unemployed    Vocation Requirements  did  a lot of standing and walking and would like to get back to working but unable right now      Cognition   Overall Cognitive Status  Within Functional Limits for tasks assessed      Posture/Postural Control   Posture/Postural Control  Postural limitations    Postural Limitations  Rounded Shoulders;Increased lumbar lordosis;Anterior pelvic tilt      ROM / Strength   AROM / PROM / Strength  AROM;Strength      AROM   Overall AROM Comments  lumbar 25% limited flexion, 25% limited rotation      Strength   Strength Assessment Site  Hip    Right/Left Hip  Right;Left    Right Hip Flexion  4/5    Right Hip Extension  4-/5    Right Hip External Rotation   4/5    Right Hip ABduction  4+/5    Right Hip ADduction  4/5    Left Hip Flexion  4-/5    Left Hip Extension  4-/5    Left Hip External Rotation  4-/5    Left Hip ABduction  4/5    Left Hip ADduction  4-/5      Flexibility   Soft Tissue Assessment /Muscle Length  yes    Hamstrings  25% limited      Special Tests    Special Tests  Lumbar    Lumbar Tests  Slump Test      Slump test   Findings  Negative      Transfers   Five time sit to stand comments   30 sec sit to stand - able to complete 3 full sit to stand need UE support      Ambulation/Gait   Gait Pattern  Decreased stride length;Lateral trunk lean to left      Standardized Balance Assessment   Standardized Balance Assessment  Timed Up and Go Test      Timed Up and Go Test   Normal TUG (seconds)  18                Objective measurements completed on examination: See above findings.  PT Long Term Goals - 01/07/18 1803      PT LONG TERM GOAL #1   Title  ind with advanced HEP    Baseline  does not know    Time  12    Period  Weeks    Status  New    Target  Date  04/01/18      PT LONG TERM GOAL #2   Title  TUG < or = to 12 sec for reduced risk of falls    Baseline  18 sec    Time  12    Period  Weeks    Status  New    Target Date  04/01/18      PT LONG TERM GOAL #3   Title  left knee will have at least 100 degrees of flexion for improved functional gait    Baseline  75 degrees    Time  12    Period  Weeks    Status  New    Target Date  04/01/18      PT LONG TERM GOAL #4   Title  patient will report knee and hip pain reduced by > or = to 40% for improved ability to do chores around the house    Baseline  no reduction yet    Time  12    Period  Weeks    Status  New    Target Date  04/01/18      PT LONG TERM GOAL #5   Title  pt will demonstrate LE strength of 4+/5 throughout bilateral LE for improved functional standing actvities.    Time  12    Period  Weeks    Status  New    Target Date  04/01/18             Plan - 01/07/18 1741    Clinical Impression Statement  Pt presents to skilled PT due to pain of knees, hip and low back.  Pt has been stumbling more frequently and feeling weak.  Pt demonstrates increased risk of fall based on 30 sec sit to stand able to do 3 sit to stands and TUG test completed in 18 seconds.  Pt is unable to stand without UE assistance.  Pt has weakness in bilateral LE.  She has 75 deg knee flexion Lt LE.  Pt has increased low back and hip pain with lumbar flexion and decreased pain with back extension.  She has posture and gait abnormalities as mentioned above.  Pt will benefit from skilled PT to address    History and Personal Factors relevant to plan of care:  chronic pain, history of abdominal surgeries    Clinical Presentation  Evolving    Clinical Presentation due to:  pt has been stumbling more frequently    Clinical Decision Making  Moderate    Rehab Potential  Good    PT Frequency  2x / week    PT Duration  12 weeks    PT Treatment/Interventions  ADLs/Self Care Home  Management;Biofeedback;Cryotherapy;Electrical Stimulation;Iontophoresis 4mg /ml Dexamethasone;Moist Heat;Ultrasound;Gait training;Stair training;Functional mobility training;Therapeutic activities;Therapeutic exercise;Balance training;Neuromuscular re-education;Patient/family education;Passive range of motion;Manual techniques;Dry needling;Taping    PT Next Visit Plan  lumbar and knee ROM, core strengthening, hip stretches, f/u on extension preference and give extension based exercises    Consulted and Agree with Plan of Care  Patient       Patient will benefit from skilled therapeutic intervention in order to improve the following deficits and impairments:  Abnormal  gait, Decreased activity tolerance, Decreased strength, Increased fascial restricitons, Pain, Difficulty walking, Impaired flexibility, Decreased range of motion, Increased muscle spasms  Visit Diagnosis: Muscle weakness (generalized)  Chronic pain of right knee  Acute pain of left knee  Pain in left hip  Stiffness of left knee, not elsewhere classified     Problem List Patient Active Problem List   Diagnosis Date Noted  . 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 Dysphonia 12/19/2017  . Left hip pain 09/06/2017  . Nightmares 11/27/2016  . Obesity, morbid ([place]) 09/28/2016  . Chronic pain syndrome 05/18/2016  . Hyperlipidemia 01/27/2014  . Palpitations 12/30/2013  . Headache(784.0) 09/29/2013  . Bilateral knee pain 05/25/2013  . Metatarsalgia of both feet 05/25/2013  . Eczema 05/12/2013  . Insomnia 05/12/2013  . Diverticulosis 04/18/2012  . Ovarian cyst, left 04/16/2012  . Breast cyst 10/02/2011  . Arthritis 02/07/2011  . Essential hypertension 05/29/2006  . PULMONARY EMBOLISM, HX OF 05/29/2006    Zannie Cove 01/07/2018, 6:08 PM  [facility] [address], [place], [place], [address] Phone: [phone]   Fax:  [phone]  Name: [name] MRN: [id number] Date of Birth: Jul 06, 1961

## 2018-01-15 ENCOUNTER — Encounter: Payer: Self-pay | Admitting: Physical Therapy

## 2018-01-15 ENCOUNTER — Ambulatory Visit: Payer: Medicaid Other | Admitting: Physical Therapy

## 2018-01-15 DIAGNOSIS — M25561 Pain in right knee: Secondary | ICD-10-CM

## 2018-01-15 DIAGNOSIS — M25662 Stiffness of left knee, not elsewhere classified: Secondary | ICD-10-CM

## 2018-01-15 DIAGNOSIS — M25552 Pain in left hip: Secondary | ICD-10-CM

## 2018-01-15 DIAGNOSIS — M6281 Muscle weakness (generalized): Secondary | ICD-10-CM

## 2018-01-15 DIAGNOSIS — G8929 Other chronic pain: Secondary | ICD-10-CM

## 2018-01-15 DIAGNOSIS — M25562 Pain in left knee: Secondary | ICD-10-CM

## 2018-01-15 NOTE — Patient Instructions (Addendum)
KNEE: Extension, Long Arc Quad (Weight)  Place weight around leg. Raise leg until knee is straight. Hold _5__ seconds.  _10__ reps per set (each leg), 4-5__ sets per day, __7_ days per week  Copyright  VHI. All rights reserved.       Copyright  VHI. All rights reserved.     Knee Raise   Lift knee and then lower it. Repeat with other knee. Repeat _10__ times each leg. Do _4-5___ sessions per day.  http://gt2.exer.us/445   Copyright  VHI. All rights reserved.  Toe Up   Gently rise up on toes and back on heels. Repeat _20___ times. Do 4-5____ sessions per day.  [facility] [address] [place], [place] [address] Phone # [phone] Fax [phone]   1. Call the [employer] to discuss scholarship opportunities for someone with medical issues. Goal is to get in the pool 1-2 x week.   2. Look up [facility], which is off [employer] near [employer]. Check out their classes offered; chair exercises.    3. Shoulder rolls: 5-10 x every hour 4. Scapular squeezes 3x day 3 sec hold 5x

## 2018-01-15 NOTE — Therapy (Signed)
[facility] [address], [place], [place], [address] Phone: [phone]   Fax:  [phone]  Physical Therapy Treatment  Patient Details  Name: [name] MRN: [id number] Date of Birth: [birth year] Referring Provider: Kathi Ludwig, MD   Encounter Date: 01/15/2018  PT End of Session - 01/15/18 0935    Visit Number  2    Date for PT Re-Evaluation  04/01/18    PT Start Time  0933    PT Stop Time  1013    PT Time Calculation (min)  40 min    Activity Tolerance  Patient tolerated treatment well    Behavior During Therapy  [facility] for tasks assessed/performed       Past Medical History:  Diagnosis Date  . Anemia 2009    Baseline hemoglobin at 11, secondary to iron deficiency  . Arthritis   . Breast mass 02/2008    Noted on mammogram March 11, 2008 3 cm simple cyst at 9 to 10:00 position of the right breast as well as multiple other smaller simple cyst, 2.6 cm mass at the 10:00 position of the right breast also representing complex cyst,  . Deep vein thrombosis (DVT) (HCC)     history of multiple  DVTs in 1989, 1989, 2004, on chronic anticoagulation with Coumadin  . Diverticulosis    noted colonoscopy 2010  . Eczema   . Fibroid uterus  2001    status post total abdominal hysterectomy, right and left salpingo-oophorectomy , done by Dr. [name]  . Heart murmur    as a child  . Hypertension   . PE (pulmonary embolism)     history of multiple PEs in 1984, 1989, 2004-  electronic data reviewed in [place] and EMR and I was not able to find single CT angiogram that was positive for pulmonary embolus nor was I able to find Doppler studies that were positive for DVTs  . 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 ABDOMINAL HYSTERECTOMY  04/13/2010   done by Dr. [name]  . BREAST LUMPECTOMY WITH RADIOACTIVE SEED LOCALIZATION Right 07/09/2017   Procedure: RIGHT BREAST LUMPECTOMY WITH RADIOACTIVE SEED LOCALIZATION;  Surgeon: Erroll Luna, MD;  Location: [place];  Service: General;  Laterality: Right;  . CHOLECYSTECTOMY     1997  . MASTECTOMY, PARTIAL  03/2006    left partial mastectomy secondary to fibrocystic disease intraluminal fibroadenoma performed March 28, 2006 done by Dr. [name]    There were no vitals filed for this visit.  Subjective Assessment - 01/15/18 0938    Subjective  Pt states she has severe arthritis.     Limitations  Walking    How long can you sit comfortably?  30 minutes of sitting makes it hard to get up    How long can you stand comfortably?  1-2 hours    How long can you walk comfortably?  over an hour    Patient Stated Goals  feeling better when walking    Currently in Pain?  Yes    Pain Score  5     Pain Location  -- Shoulder RT and LT hip but she hurts "everywhere."     Pain Orientation  -- everywhere  Pain Descriptors / Indicators  Constant    Aggravating Factors   Doing too much    Pain Relieving Factors  Not much    Multiple Pain Sites  No                       [facility] Adult PT Treatment/Exercise - 01/15/18 0001      Self-Care   Self-Care  Other Self-Care Comments    Other Self-Care Comments   Educated pt in benefits of aquatic exercise and chair exercises. She was given places to call to inquire about these classes.       Knee/Hip Exercises: Aerobic   Nustep  L1 3 min PTA present to monitor             PT Education - 01/15/18 1000    Education provided  Yes    Education Details  HEP    Person(s) Educated  Patient    Methods  Explanation;Demonstration;Tactile cues;Handout    Comprehension  Verbalized understanding;Returned  demonstration          PT Long Term Goals - 01/07/18 1803      PT LONG TERM GOAL #1   Title  ind with advanced HEP    Baseline  does not know    Time  12    Period  Weeks    Status  New    Target Date  04/01/18      PT LONG TERM GOAL #2   Title  TUG < or = to 12 sec for reduced risk of falls    Baseline  18 sec    Time  12    Period  Weeks    Status  New    Target Date  04/01/18      PT LONG TERM GOAL #3   Title  left knee will have at least 100 degrees of flexion for improved functional gait    Baseline  75 degrees    Time  12    Period  Weeks    Status  New    Target Date  04/01/18      PT LONG TERM GOAL #4   Title  patient will report knee and hip pain reduced by > or = to 40% for improved ability to do chores around the house    Baseline  no reduction yet    Time  12    Period  Weeks    Status  New    Target Date  04/01/18      PT LONG TERM GOAL #5   Title  pt will demonstrate LE strength of 4+/5 throughout bilateral LE for improved functional standing actvities.    Time  12    Period  Weeks    Status  New    Target Date  04/01/18            Plan - 01/15/18 1505    Clinical Impression Statement  Pt reports today she is in constant pain. During education for initial HEP pt reported "feeling it" but not any increased pain. She was encouraged to call the [employer] to discuss scholarships in order to get into the pool 1-2 x week in addition to looking into the [facility] for chair exercise classes. This was pt's first visit after evaluation so too early for goals.      Rehab Potential  Good    PT Frequency  2x / week    PT Duration  12  weeks    PT Treatment/Interventions  ADLs/Self Care Home Management;Biofeedback;Cryotherapy;Electrical Stimulation;Iontophoresis 4mg /ml Dexamethasone;Moist Heat;Ultrasound;Gait training;Stair training;Functional mobility training;Therapeutic activities;Therapeutic exercise;Balance training;Neuromuscular  re-education;Patient/family education;Passive range of motion;Manual techniques;Dry needling;Taping    PT Next Visit Plan  Review HEP ( not done in [place]) and follow up with pt if she contacted[facility] into [facility] or [facility]. Nustep    Consulted and Agree with Plan of Care  Patient       Patient will benefit from skilled therapeutic intervention in order to improve the following deficits and impairments:  Abnormal gait, Decreased activity tolerance, Decreased strength, Increased fascial restricitons, Pain, Difficulty walking, Impaired flexibility, Decreased range of motion, Increased muscle spasms  Visit Diagnosis: Muscle weakness (generalized)  Chronic pain of right knee  Acute pain of left knee  Pain in left hip  Stiffness of left knee, not elsewhere classified     Problem List Patient Active Problem List   Diagnosis Date Noted  . 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 Dysphonia 12/19/2017  . Left hip pain 09/06/2017  . Nightmares 11/27/2016  . Obesity, morbid ([place]) 09/28/2016  . Chronic pain syndrome 05/18/2016  . Hyperlipidemia 01/27/2014  . Palpitations 12/30/2013  . Headache(784.0) 09/29/2013  . Bilateral knee pain 05/25/2013  . Metatarsalgia of both feet 05/25/2013  . Eczema 05/12/2013  . Insomnia 05/12/2013  . Diverticulosis 04/18/2012  . Ovarian cyst, left 04/16/2012  . Breast cyst 10/02/2011  . Arthritis 02/07/2011  . Essential hypertension 05/29/2006  . PULMONARY EMBOLISM, HX OF 05/29/2006    Charmelle Soh, PTA 01/15/2018, 3:10 PM  [facility] [address], [place], [place], [address] Phone: [phone]   Fax:  [phone]  Name: [name] MRN: [id number] Date of Birth: January 31, 1961

## 2018-01-21 ENCOUNTER — Encounter: Payer: Medicaid Other | Admitting: Physical Therapy

## 2018-01-22 ENCOUNTER — Encounter: Payer: Medicaid Other | Admitting: Physical Therapy

## 2018-01-22 ENCOUNTER — Encounter: Payer: Self-pay | Admitting: Physical Therapy

## 2018-01-22 ENCOUNTER — Ambulatory Visit: Payer: Medicaid Other | Attending: Physician Assistant | Admitting: Physical Therapy

## 2018-01-22 DIAGNOSIS — M25561 Pain in right knee: Secondary | ICD-10-CM | POA: Diagnosis present

## 2018-01-22 DIAGNOSIS — M25552 Pain in left hip: Secondary | ICD-10-CM | POA: Diagnosis present

## 2018-01-22 DIAGNOSIS — M25662 Stiffness of left knee, not elsewhere classified: Secondary | ICD-10-CM

## 2018-01-22 DIAGNOSIS — M25562 Pain in left knee: Secondary | ICD-10-CM

## 2018-01-22 DIAGNOSIS — M6281 Muscle weakness (generalized): Secondary | ICD-10-CM

## 2018-01-22 DIAGNOSIS — G8929 Other chronic pain: Secondary | ICD-10-CM | POA: Insufficient documentation

## 2018-01-22 NOTE — Therapy (Signed)
[facility] [address] [place], [place], [address] Phone: [phone]   Fax:  [phone]  Physical Therapy Treatment  Patient Details  Name: [name] MRN: [id number] Date of Birth: 1961-07-17 Referring Provider: Kathi Ludwig, MD   Encounter Date: 01/22/2018  PT End of Session - 01/22/18 0935    Visit Number  3    Date for PT Re-Evaluation  04/01/18    PT Start Time  0933    PT Stop Time  1016    PT Time Calculation (min)  43 min    Activity Tolerance  Patient tolerated treatment well    Behavior During Therapy  [facility] for tasks assessed/performed       Past Medical History:  Diagnosis Date  . Anemia 2009    Baseline hemoglobin at 11, secondary to iron deficiency  . Arthritis   . Breast mass 02/2008    Noted on mammogram March 11, 2008 3 cm simple cyst at 9 to 10:00 position of the right breast as well as multiple other smaller simple cyst, 2.6 cm mass at the 10:00 position of the right breast also representing complex cyst,  . Deep vein thrombosis (DVT) (HCC)     history of multiple  DVTs in 1989, 1989, 2004, on chronic anticoagulation with Coumadin  . Diverticulosis    noted colonoscopy 2010  . Eczema   . Fibroid uterus  2001    status post total abdominal hysterectomy, right and left salpingo-oophorectomy , done by Dr. [name]  . Heart murmur    as a child  . Hypertension   . PE (pulmonary embolism)     history of multiple PEs in 1984, 1989, 2004-  electronic data reviewed in [place] and EMR and I was not able to find single CT angiogram that was positive for pulmonary embolus nor was I able to find Doppler studies that were positive for DVTs  . 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 ABDOMINAL HYSTERECTOMY  04/13/2010   done by Dr. [name]  . BREAST LUMPECTOMY WITH RADIOACTIVE SEED LOCALIZATION Right 07/09/2017   Procedure: RIGHT BREAST LUMPECTOMY WITH RADIOACTIVE SEED LOCALIZATION;  Surgeon: Erroll Luna, MD;  Location: [facility];  Service: General;  Laterality: Right;  . CHOLECYSTECTOMY     1997  . MASTECTOMY, PARTIAL  03/2006    left partial mastectomy secondary to fibrocystic disease intraluminal fibroadenoma performed March 28, 2006 done by Dr. [name]    There were no vitals filed for this visit.  Subjective Assessment - 01/22/18 0936    Subjective  I am holding steady: my Rt shoulder hurts this AM as well as my LT knee. I have not looked into water exercises or any where else.     Currently in Pain?  Yes RT shoulder 6/10, Lt knee 7/10    Pain Descriptors / Indicators  Tightness;Sharp;Constant    Aggravating Factors   Raising RT arm up    Pain Relieving Factors  Not much, meds    Multiple Pain Sites  No         [facility] PT Assessment - 01/22/18 0001      Timed Up and Go Test   Normal TUG (seconds)  14  [place] Adult PT Treatment/Exercise - 01/22/18 0001      Knee/Hip Exercises: Aerobic   Nustep  L1 x 6 min PTA present to monitor      Knee/Hip Exercises: Standing   Hip Flexion  AROM;Stengthening;Both;1 set;10 reps;Knee bent    Hip Abduction  AROM;Stengthening;Both;1 set;10 reps;Knee bent      Knee/Hip Exercises: Seated   Long Arc Quad  AROM;Strengthening;Both;2 sets;10 reps    Sit to [employer]  -- On pad 5x 2             PT Education - 01/22/18 1010    Education provided  Yes    Education Details  HEP progression: LE strength and trunk/low back flexibility    Person(s) Educated  Patient    Methods  Explanation;Demonstration;Verbal cues;Handout;Tactile cues    Comprehension  Returned demonstration;Verbalized  understanding;Verbal cues required          PT Long Term Goals - 01/22/18 1002      PT LONG TERM GOAL #1   Title  ind with advanced HEP    Time  12    Period  Weeks    Status  On-going      PT LONG TERM GOAL #2   Title  TUG < or = to 12 sec for reduced risk of falls    Baseline  14 sec    Time  12    Period  Weeks    Status  On-going      PT LONG TERM GOAL #4   Title  patient will report knee and hip pain reduced by > or = to 40% for improved ability to do chores around the house    Time  12    Period  Weeks    Status  On-going            Plan - 01/22/18 0935    Clinical Impression Statement  Pt continues to complain of pain: mainly Rt shoulder and LT knee. She reports not looking into the [facility] pool or [facility] at this time. She does report walking on her treadmill for 15 min on a slow speed. She is compliant and independent in her initial HEP. This HEP was added to today to include flexibility exercises for her trunk and low back in addition to sit to satnd and standing hip kicks. She was able to demo all of these in the clinic today without pain but with significnat effort on her part.     Rehab Potential  Good    PT Frequency  2x / week    PT Duration  12 weeks    PT Treatment/Interventions  ADLs/Self Care Home Management;Biofeedback;Cryotherapy;Electrical Stimulation;Iontophoresis 4mg /ml Dexamethasone;Moist Heat;Ultrasound;Gait training;Stair training;Functional mobility training;Therapeutic activities;Therapeutic exercise;Balance training;Neuromuscular re-education;Patient/family education;Passive range of motion;Manual techniques;Dry needling;Taping    PT Next Visit Plan  ERO visit next, pt would like to continue with more therapy.     Consulted and Agree with Plan of Care  Patient       Patient will benefit from skilled therapeutic intervention in order to improve the following deficits and impairments:  Abnormal gait, Decreased activity tolerance, Decreased  strength, Increased fascial restricitons, Pain, Difficulty walking, Impaired flexibility, Decreased range of motion, Increased muscle spasms  Visit Diagnosis: Muscle weakness (generalized)  Chronic pain of right knee  Acute pain of left knee  Pain in left hip  Stiffness of left knee, not elsewhere classified     Problem List Patient Active Problem List   Diagnosis Date Noted  .  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 Dysphonia 12/19/2017  . Left hip pain 09/06/2017  . Nightmares 11/27/2016  . Obesity, morbid ([place]) 09/28/2016  . Chronic pain syndrome 05/18/2016  . Hyperlipidemia 01/27/2014  . Palpitations 12/30/2013  . Headache(784.0) 09/29/2013  . Bilateral knee pain 05/25/2013  . Metatarsalgia of both feet 05/25/2013  . Eczema 05/12/2013  . Insomnia 05/12/2013  . Diverticulosis 04/18/2012  . Ovarian cyst, left 04/16/2012  . Breast cyst 10/02/2011  . Arthritis 02/07/2011  . Essential hypertension 05/29/2006  . PULMONARY EMBOLISM, HX OF 05/29/2006    Franklin Baumbach, PTA 01/22/2018, 5:06 PM  [facility] [address], [place], [place], [address] Phone: [phone]   Fax:  [phone]  Name: [name] MRN: [id number] Date of Birth: 1960/11/12

## 2018-01-22 NOTE — Patient Instructions (Addendum)
Sit-to-Stand Exercise The sit-to-stand exercise (also known as the chair stand or chair rise exercise) strengthens your lower body and helps you maintain or improve your mobility and independence. The goal is to do the sit-to-stand exercise without using your hands. This will be easier as you become stronger. You should always talk with your health care provider before starting any exercise program, especially if you have had recent surgery. Do the exercise exactly as told by your health care provider and adjust it as directed. It is normal to feel mild stretching, pulling, tightness, or discomfort as you do this exercise, but you should stop right away if you feel sudden pain or your pain gets worse. Do not begin doing this exercise until told by your health care provider. What the sit-to-stand exercise does The sit-to-stand exercise helps to strengthen the muscles in your thighs and the muscles in the center of your body that give you stability (core muscles). This exercise is especially helpful if:  You have had knee or hip surgery.  You have trouble getting up from a chair, out of a car, or off the toilet.  How to do the sit-to-stand exercise 1. Sit toward the front edge of a sturdy chair without armrests. Your knees should be bent and your feet should be flat on the floor and shoulder-width apart. 2. Place your hands lightly on each side of the seat. Keep your back and neck as straight as possible, with your chest slightly forward. 3. Breathe in slowly. Lean forward and slightly shift your weight to the front of your feet. 4. Breathe out as you slowly stand up. Use your hands as little as possible. 5. Stand and pause for a full breath in and out. 6. Breathe in as you sit down slowly. Tighten your core and abdominal muscles to control your lowering as much as possible. 7. Breathe out slowly. 8. Do this exercise 10-15 times. If needed, do it fewer times until you build up strength. 9. Rest for  1 minute, then do another set of 10-15 repetitions. To change the difficulty of the sit-to-stand exercise  If the exercise is too difficult, use a chair with sturdy armrests, and push off the armrests to help you come to the standing position. You can also use the armrests to help slowly lower yourself back to sitting. As this gets easier, try to use your arms less. You can also place a firm cushion or pillow on the chair to make the surface higher.  If this exercise is too easy, do not use your arms to help raise or lower yourself. You can also wear a weighted vest, use hand weights, increase your repetitions, or try a lower chair. General tips  You may feel tired when starting an exercise routine. This is normal.  You may have muscle soreness that lasts a few days. This is normal. As you get stronger, you may not feel muscle soreness.  Use smooth, steady movements.  Do not  hold your breath during strength exercises. This can cause unsafe changes in your blood pressure.  Breathe in slowly through your nose, and breathe out slowly through your mouth. Summary  Strengthening your lower body is an important step to help you move safely and independently.  The sit-to-stand exercise helps strengthen the muscles in your thighs and core.  You should always talk with your health care provider before starting any exercise program, especially if you have had recent surgery. This information is not intended to replace   advice given to you by your health care provider. Make sure you discuss any questions you have with your health care provider. Document Released: 09/27/2016 Document Revised: 09/27/2016 Document Reviewed: 09/27/2016 Elsevier Interactive Patient Education  2018 [employer].   Hip Abduction (Standing)    Stand with support. Lift right leg out to side, keeping toe forward. Then alternate legs.   Repeat __10_ times. Do 3___ times a day. You can try a light ankle weight to make  this more challenging if it does not increase your pain. This is also an exercise you could do in the water.  Marching In-Place    Standing straight, alternate bringing knees toward trunk. Start holding onto the counter top lightly as you build your balance. Then, as you feel more steady, swing your arms like in the picture.  Do ___ sets. Do ___ times per day.  Copyright  VHI. All rights reserved.     Copyright  VHI. All rights reserved.   #4 Lay in bed, knees bent. Then pull one knee to your chest to stretch your back. Breathe! Hold 20 sec. Use a towel to help behind your knee if you have hard time pulling the knee towards you.  Do 3x on each leg 2x,   Then try to move your thighs side to side to stretch out your back. Do this after the knee to chest stretch. Do side to side 20x every time.

## 2018-01-27 ENCOUNTER — Telehealth: Payer: Self-pay

## 2018-01-27 NOTE — Telephone Encounter (Signed)
Needs to speak with a nurse about increasing the dosage on gabapentin (NEURONTIN) 100 MG capsule. Please call back.

## 2018-01-27 NOTE — Telephone Encounter (Signed)
Informed pt she would need to be seen for increase of gabapentin dose, [facility] 6/11 0915

## 2018-01-28 ENCOUNTER — Ambulatory Visit (INDEPENDENT_AMBULATORY_CARE_PROVIDER_SITE_OTHER): Payer: Medicaid Other | Admitting: Internal Medicine

## 2018-01-28 ENCOUNTER — Other Ambulatory Visit: Payer: Self-pay

## 2018-01-28 VITALS — BP 108/80 | HR 71 | Temp 98.5°F | Ht 71.0 in | Wt 288.0 lb

## 2018-01-28 DIAGNOSIS — G629 Polyneuropathy, unspecified: Secondary | ICD-10-CM | POA: Diagnosis not present

## 2018-01-28 MED ORDER — GABAPENTIN 300 MG PO CAPS: 300.0000 mg | ORAL_CAPSULE | Freq: Three times a day (TID) | ORAL | 2 refills | Status: DC

## 2018-01-28 MED ORDER — ORLISTAT 120 MG PO CAPS: 120.0000 mg | ORAL_CAPSULE | Freq: Three times a day (TID) | ORAL | 2 refills | Status: DC

## 2018-01-28 NOTE — Assessment & Plan Note (Signed)
Patient requesting medications to have assist with weight loss.  She reports no improvement on her previous naltrexone-bupropion.  Discussed options today and discussed that Medicaid does not cover most medications.  After discussion she does wish to start a trial of orlistat.  Discussed the side effect profile and she was agreeable to trying the medication.  We will send in orlistat 120 mg with meals.

## 2018-01-28 NOTE — Telephone Encounter (Signed)
Thank you :)

## 2018-01-28 NOTE — Assessment & Plan Note (Addendum)
Ms. Nierman presents today for follow-up of her foot pain.  She reports that she was started on gabapentin 100 mg nightly at her visit a month ago.  It appears that she was started on this on her 12/19/2017 visit.  Details regarding why this medication was started are not clear from that visit.  There is documentation under her left hip pain but states we will utilize a short course of gabapentin to assist with her pain.  No other details provided.  She reports that she has bilateral pain in her feet on the dorsum surface.  She reports that this pain is burning and stinging in nature.  No radiation.  No issues with strength or sensation.  She reports that this is been present for years.  Ports no improvement on the 100 mg of gabapentin at night.  Assessment and plan:  she has no history of diabetes but on her last A1c 2 years ago she was 6.1 in the prediabetic range.  With her morbid obesity she is at risk for diabetes and she may have a early manifestation of neuropathy pain from diabetes.  She has no recent B12 or TSH.  Will obtain both of these today.  Will increase the dose of gabapentin to 300 mg 3 times daily.  Can uptitrate if needed.  However, no clear indication at this moment for gabapentin and this may need to be discontinued if no benefit or source is found.

## 2018-01-28 NOTE — Patient Instructions (Addendum)
[name],  [name] can take 300 mg of the gabapentin 3 times a day. If you aren't having any benefit in the next several weeks call us and let us know.   We will let you know about the Orlistat.  Please schedule follow up with your regular doctor in the next 1-2 months.

## 2018-01-28 NOTE — Progress Notes (Signed)
   CC: Foot pain  HPI:  [name] is a 57 y.o. female with a past medical history listed below here today for follow up of her foot pain.  For details of today's visit and the status of her chronic medical issues please refer to the assessment and plan.   Past Medical History:  Diagnosis Date  . Anemia 2009    Baseline hemoglobin at 11, secondary to iron deficiency  . Arthritis   . Breast mass 02/2008    Noted on mammogram March 11, 2008 3 cm simple cyst at 9 to 10:00 position of the right breast as well as multiple other smaller simple cyst, 2.6 cm mass at the 10:00 position of the right breast also representing complex cyst,  . Deep vein thrombosis (DVT) (HCC)     history of multiple  DVTs in 1989, 1989, 2004, on chronic anticoagulation with Coumadin  . Diverticulosis    noted colonoscopy 2010  . Eczema   . Fibroid uterus  2001    status post total abdominal hysterectomy, right and left salpingo-oophorectomy , done by Dr. [name]  . Heart murmur    as a child  . Hypertension   . PE (pulmonary embolism)     history of multiple PEs in 1984, 1989, 2004-  electronic data reviewed in [place] and EMR and I was not able to find single CT angiogram that was positive for pulmonary embolus nor was I able to find Doppler studies that were positive for DVTs  . 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 HEENT- Atraumatic, normocephalic CARDIAC-  RRR, no murmurs, rubs or gallops. RESP- Moving equal volumes of air, and clear to auscultation bilaterally, no wheezes or crackles. NEURO- intact strength and sensation throuhgout SKIN- Warm, dry, No rash or lesion. PSYCH- Normal mood and affect, appropriate thought content and speech.   Assessment & Plan:   See Encounters Tab for problem based charting.  Patient discussed with Dr. Rebeca Alert

## 2018-01-29 ENCOUNTER — Encounter: Payer: Self-pay | Admitting: Physical Therapy

## 2018-01-29 ENCOUNTER — Ambulatory Visit: Payer: Medicaid Other | Admitting: Physical Therapy

## 2018-01-29 DIAGNOSIS — M6281 Muscle weakness (generalized): Secondary | ICD-10-CM | POA: Diagnosis not present

## 2018-01-29 DIAGNOSIS — G8929 Other chronic pain: Secondary | ICD-10-CM

## 2018-01-29 DIAGNOSIS — M25552 Pain in left hip: Secondary | ICD-10-CM

## 2018-01-29 DIAGNOSIS — M25562 Pain in left knee: Secondary | ICD-10-CM

## 2018-01-29 DIAGNOSIS — M25662 Stiffness of left knee, not elsewhere classified: Secondary | ICD-10-CM

## 2018-01-29 DIAGNOSIS — M25561 Pain in right knee: Secondary | ICD-10-CM

## 2018-01-29 LAB — VITAMIN B12: Vitamin B-12: 363 pg/mL (ref 232–1245)

## 2018-01-29 LAB — HEMOGLOBIN A1C
Est. average glucose Bld gHb Est-mCnc: 120 mg/dL
Hgb A1c MFr Bld: 5.8 % — ABNORMAL HIGH (ref 4.8–5.6)

## 2018-01-29 LAB — TSH: TSH: 0.871 u[IU]/mL (ref 0.450–4.500)

## 2018-01-29 NOTE — Therapy (Signed)
[facility] [address] [place], [place], [address] Phone: [phone]   Fax:  [phone]  Physical Therapy Treatment  Patient Details  Name: KACEE SUKHU MRN: [id number] Date of Birth: [birth year] Referring Provider: Kathi Ludwig, MD   Encounter Date: 01/29/2018  PT End of Session - 01/29/18 0937    Visit Number  4    Number of Visits  4    Date for PT Re-Evaluation  04/01/18    Authorization Type  medicaid - re-eval 6/12    Authorization - Visit Number  4    Authorization - Number of Visits  4    PT Start Time  0935    PT Stop Time  9892    PT Time Calculation (min)  39 min    Activity Tolerance  Patient tolerated treatment well    Behavior During Therapy  [facility] for tasks assessed/performed       Past Medical History:  Diagnosis Date  . Anemia 2009    Baseline hemoglobin at 11, secondary to iron deficiency  . Arthritis   . Breast mass 02/2008    Noted on mammogram March 11, 2008 3 cm simple cyst at 9 to 10:00 position of the right breast as well as multiple other smaller simple cyst, 2.6 cm mass at the 10:00 position of the right breast also representing complex cyst,  . Deep vein thrombosis (DVT) (HCC)     history of multiple  DVTs in 1989, 1989, 2004, on chronic anticoagulation with Coumadin  . Diverticulosis    noted colonoscopy 2010  . Eczema   . Fibroid uterus  2001    status post total abdominal hysterectomy, right and left salpingo-oophorectomy , done by Dr. [name]  . Heart murmur    as a child  . Hypertension   . PE (pulmonary embolism)     history of multiple PEs in 1984, 1989, 2004-  electronic data reviewed in [place] and EMR and I was not able to find single CT angiogram that was positive for pulmonary embolus nor was I able to find Doppler studies that were positive for DVTs  . 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 ABDOMINAL HYSTERECTOMY  04/13/2010   done by Dr. [name]  . BREAST LUMPECTOMY WITH RADIOACTIVE SEED LOCALIZATION Right 07/09/2017   Procedure: RIGHT BREAST LUMPECTOMY WITH RADIOACTIVE SEED LOCALIZATION;  Surgeon: Erroll Luna, MD;  Location: [place];  Service: General;  Laterality: Right;  . CHOLECYSTECTOMY     1997  . MASTECTOMY, PARTIAL  03/2006    left partial mastectomy secondary to fibrocystic disease intraluminal fibroadenoma performed March 28, 2006 done by Dr. [name]    There were no vitals filed for this visit.  Subjective Assessment - 01/29/18 0938    Subjective  Right now not feeling good, last week the knees were feeling better.      Limitations  Walking    How long can you sit comfortably?  30 minutes of sitting makes it hard to get up    Currently in Pain?  Yes    Pain Score  7     Pain Location  Knee    Pain Orientation  Right;Left    Pain  Descriptors / Indicators  Aching;Pressure    Pain Type  Chronic pain    Pain Onset  More than a month ago    Pain Frequency  Constant    Aggravating Factors   doing too much or not moving    Pain Relieving Factors  medication (gabapentin)    Effect of Pain on Daily Activities  walking    Multiple Pain Sites  No         [facility] PT Assessment - 01/29/18 0001      ROM / Strength   AROM / PROM / Strength  AROM      AROM   AROM Assessment Site  Knee    Right/Left Knee  Right;Left    Right Knee Extension  -10 in supine    Right Knee Flexion  109 sitting    Left Knee Extension  -11 in supine    Left Knee Flexion  91 sitting      Strength   Right Hip Flexion  4/5    Right Hip External Rotation   4+/5    Right Hip ABduction  4+/5    Right Hip ADduction  4/5    Left Hip Flexion  4-/5     Left Hip External Rotation  4/5    Left Hip ABduction  4/5    Left Hip ADduction  4-/5    Right/Left Knee  Right;Left    Right Knee Flexion  4/5    Right Knee Extension  4/5    Left Knee Flexion  4-/5    Left Knee Extension  4-/5                   [facility] Adult PT Treatment/Exercise - 01/29/18 0001      Self-Care   Other Self-Care Comments   educated and performed rolling with massage wand      Knee/Hip Exercises: Stretches   Active Hamstring Stretch  Right;Left;3 reps;30 seconds      Knee/Hip Exercises: Aerobic   Nustep  L1 x 6 min PT present to monitor      Knee/Hip Exercises: Seated   Long Arc Quad  AROM;Strengthening;Both;2 sets;10 reps                  PT Long Term Goals - 01/29/18 0941      PT LONG TERM GOAL #1   Title  ind with advanced HEP    Baseline  doing the beginner exercises    Time  12    Period  Weeks    Status  On-going      PT LONG TERM GOAL #2   Title  TUG < or = to 12 sec for reduced risk of falls    Baseline  14 sec    Time  12    Period  Weeks    Status  On-going      PT LONG TERM GOAL #3   Title  left knee will have at least 100 degrees of flexion for improved functional gait    Baseline  91 deg    Time  12    Period  Weeks    Status  On-going      PT LONG TERM GOAL #4   Title  patient will report knee and hip pain reduced by > or = to 40% for improved ability to do chores around the house    Baseline  no reduction yet    Time  12    Period  Weeks    Status  On-going      PT LONG TERM GOAL #5   Title  pt will demonstrate LE strength of 4+/5 throughout bilateral LE for improved functional standing actvities.    Baseline  hip ER improved to 4/5, everything else is same    Time  12    Period  Weeks    Status  On-going            Plan - 01/29/18 1016    Clinical Impression Statement  Pt made progress towards long term goals demonstrating reduced fall risk and improved LE strength and ROM.  Pt continues  to have weakness and pain that limits functional activities including walking.  Pt is benefitting from skilled PT for pain management and techniques to manage pain at home in order to progress strength.  Pt is recommended to continue with skilled PT in order to achieve functional goals.    PT Treatment/Interventions  ADLs/Self Care Home Management;Biofeedback;Cryotherapy;Electrical Stimulation;Iontophoresis 4mg /ml Dexamethasone;Moist Heat;Ultrasound;Gait training;Stair training;Functional mobility training;Therapeutic activities;Therapeutic exercise;Balance training;Neuromuscular re-education;Patient/family education;Passive range of motion;Manual techniques;Dry needling;Taping    PT Next Visit Plan  progress LE strength and ROM as tolerated, f/u on rolling to massage thigh and IT bands    Consulted and Agree with Plan of Care  Patient       Patient will benefit from skilled therapeutic intervention in order to improve the following deficits and impairments:  Abnormal gait, Decreased activity tolerance, Decreased strength, Increased fascial restricitons, Pain, Difficulty walking, Impaired flexibility, Decreased range of motion, Increased muscle spasms  Visit Diagnosis: Muscle weakness (generalized)  Chronic pain of right knee  Acute pain of left knee  Pain in left hip  Stiffness of left knee, not elsewhere classified     Problem List Patient Active Problem List   Diagnosis Date Noted  . Neuropathy 01/28/2018  . 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 Dysphonia 12/19/2017  . Left hip pain 09/06/2017  . Nightmares 11/27/2016  . Obesity, morbid ([place]) 09/28/2016  . Chronic pain syndrome 05/18/2016  . Hyperlipidemia 01/27/2014  . Palpitations 12/30/2013  . Headache(784.0) 09/29/2013  . Bilateral knee pain 05/25/2013  . Metatarsalgia of both feet 05/25/2013  . Eczema 05/12/2013  . Insomnia 05/12/2013  . Diverticulosis 04/18/2012  . Ovarian cyst, left 04/16/2012  . Breast cyst  10/02/2011  . Arthritis 02/07/2011  . Essential hypertension 05/29/2006  . PULMONARY EMBOLISM, HX OF 05/29/2006    Zannie Cove, PT 01/29/2018, 6:10 PM  [facility] [address], [place], [place], [address] Phone: [phone]   Fax:  [phone]  Name: CASS EDINGER MRN: [id number] Date of Birth: 12-12-1960

## 2018-01-31 NOTE — Progress Notes (Signed)
Internal Medicine Clinic Attending  Case discussed with Dr. Boswell  at the time of the visit.  We reviewed the resident's history and exam and pertinent patient test results.  I agree with the assessment, diagnosis, and plan of care documented in the resident's note.  Alexander N Raines, MD   

## 2018-02-16 DIAGNOSIS — J069 Acute upper respiratory infection, unspecified: Secondary | ICD-10-CM | POA: Insufficient documentation

## 2018-02-16 DIAGNOSIS — Z79899 Other long term (current) drug therapy: Secondary | ICD-10-CM | POA: Diagnosis not present

## 2018-02-16 DIAGNOSIS — I10 Essential (primary) hypertension: Secondary | ICD-10-CM | POA: Insufficient documentation

## 2018-02-16 DIAGNOSIS — R05 Cough: Secondary | ICD-10-CM | POA: Diagnosis present

## 2018-02-16 DIAGNOSIS — R071 Chest pain on breathing: Secondary | ICD-10-CM | POA: Insufficient documentation

## 2018-02-17 ENCOUNTER — Emergency Department (HOSPITAL_COMMUNITY): Payer: Medicaid Other

## 2018-02-17 ENCOUNTER — Emergency Department (HOSPITAL_COMMUNITY)
Admission: EM | Admit: 2018-02-17 | Discharge: 2018-02-17 | Disposition: A | Discharge status: Home / Self Care | Payer: Medicaid Other | Attending: Emergency Medicine | Admitting: Emergency Medicine

## 2018-02-17 ENCOUNTER — Other Ambulatory Visit: Payer: Self-pay

## 2018-02-17 ENCOUNTER — Encounter (HOSPITAL_COMMUNITY): Payer: Self-pay

## 2018-02-17 DIAGNOSIS — R0781 Pleurodynia: Secondary | ICD-10-CM

## 2018-02-17 DIAGNOSIS — J069 Acute upper respiratory infection, unspecified: Secondary | ICD-10-CM

## 2018-02-17 DIAGNOSIS — B9789 Other viral agents as the cause of diseases classified elsewhere: Secondary | ICD-10-CM

## 2018-02-17 LAB — BASIC METABOLIC PANEL
Anion gap: 9 (ref 5–15)
BUN: 19 mg/dL (ref 6–20)
CO2: 24 mmol/L (ref 22–32)
Calcium: 9.3 mg/dL (ref 8.9–10.3)
Chloride: 105 mmol/L (ref 98–111)
Creatinine, Ser: 1.01 mg/dL — ABNORMAL HIGH (ref 0.44–1.00)
GFR calc Af Amer: >60 mL/min (ref >60)
GFR calc non Af Amer: 60 mL/min — ABNORMAL LOW (ref >60)
Glucose, Bld: 110 mg/dL — ABNORMAL HIGH (ref 70–99)
Potassium: 3.5 mmol/L (ref 3.5–5.1)
Sodium: 138 mmol/L (ref 135–145)

## 2018-02-17 LAB — CBC WITH DIFFERENTIAL/PLATELET
Basophils Absolute: 0 10*3/uL (ref 0.0–0.1)
Basophils Relative: 0 %
Eosinophils Absolute: 0.2 10*3/uL (ref 0.0–0.7)
Eosinophils Relative: 2 %
HCT: 36.1 % (ref 36.0–46.0)
Hemoglobin: 12.1 g/dL (ref 12.0–15.0)
Lymphocytes Relative: 49 %
Lymphs Abs: 3.6 10*3/uL (ref 0.7–4.0)
MCH: 28.7 pg (ref 26.0–34.0)
MCHC: 33.5 g/dL (ref 30.0–36.0)
MCV: 85.5 fL (ref 78.0–100.0)
Monocytes Absolute: 0.5 10*3/uL (ref 0.1–1.0)
Monocytes Relative: 6 %
Neutro Abs: 3.2 10*3/uL (ref 1.7–7.7)
Neutrophils Relative %: 43 %
Platelets: 303 10*3/uL (ref 150–400)
RBC: 4.22 MIL/uL (ref 3.87–5.11)
RDW: 13.9 % (ref 11.5–15.5)
WBC: 7.4 10*3/uL (ref 4.0–10.5)

## 2018-02-17 LAB — I-STAT TROPONIN, ED: Troponin i, poc: 0 ng/mL (ref 0.00–0.08)

## 2018-02-17 MED ORDER — METOCLOPRAMIDE HCL 5 MG/ML IJ SOLN
10.0000 mg | Freq: Once | INTRAMUSCULAR | Status: AC
  Administered 2018-02-17: 10 mg via INTRAVENOUS
  Filled 2018-02-17: qty 2

## 2018-02-17 MED ORDER — BENZONATATE 100 MG PO CAPS: 100.0000 mg | ORAL_CAPSULE | Freq: Three times a day (TID) | ORAL | 0 refills | Status: DC

## 2018-02-17 MED ORDER — PREDNISONE 10 MG PO TABS: 60.0000 mg | ORAL_TABLET | Freq: Every day | ORAL | 0 refills | Status: DC

## 2018-02-17 MED ORDER — IOPAMIDOL (ISOVUE-370) INJECTION 76%
100.0000 mL | Freq: Once | INTRAVENOUS | Status: AC | PRN
  Administered 2018-02-17: 100 mL via INTRAVENOUS

## 2018-02-17 MED ORDER — ALBUTEROL SULFATE HFA 108 (90 BASE) MCG/ACT IN AERS: 1.0000 | INHALATION_SPRAY | Freq: Four times a day (QID) | RESPIRATORY_TRACT | 0 refills | Status: DC | PRN

## 2018-02-17 MED ORDER — IOPAMIDOL (ISOVUE-370) INJECTION 76%
INTRAVENOUS | Status: DC
Start: 2018-02-17 — End: 2018-02-17
  Filled 2018-02-17: qty 100

## 2018-02-17 MED ORDER — SODIUM CHLORIDE 0.9 % IV BOLUS
1000.0000 mL | Freq: Once | INTRAVENOUS | Status: AC
  Administered 2018-02-17: 1000 mL via INTRAVENOUS

## 2018-02-17 MED ORDER — BENZONATATE 100 MG PO CAPS
200.0000 mg | ORAL_CAPSULE | Freq: Once | ORAL | Status: AC
  Administered 2018-02-17: 200 mg via ORAL
  Filled 2018-02-17: qty 2

## 2018-02-17 NOTE — Discharge Instructions (Addendum)
Take the medications prescribed. Please see your doctor in 1 week. As discussed, work-up in the ER did not show pneumonia or a blood clot in the lung.

## 2018-02-17 NOTE — ED Provider Notes (Signed)
[facility] DEPT Provider Note   CSN: [id number] Arrival date & time: 02/16/18  2353     History   Chief Complaint Chief Complaint  Patient presents with  . Cough    HPI [name] is a 57 y.o. female.  HPI  57 year old female with history of DVT, PE, remote history of breast mass, hypertension comes in with chief complaint of cough and pleuritic chest pain.  Patient has had a nonproductive cough for the last 3 weeks.  Her symptoms have gotten worse over the past few days, and now she has chest pain in her back around the bra strap.  Patient also reports worsening of the pain when she takes a deep breath.   Patient denies any associated fevers, chills and the cough is mostly dry.  She has taken multiple over-the-counter medicine without relief.  Past Medical History:  Diagnosis Date  . Anemia 2009    Baseline hemoglobin at 11, secondary to iron deficiency  . Arthritis   . Breast mass 02/2008    Noted on mammogram March 11, 2008 3 cm simple cyst at 9 to 10:00 position of the right breast as well as multiple other smaller simple cyst, 2.6 cm mass at the 10:00 position of the right breast also representing complex cyst,  . Deep vein thrombosis (DVT) (HCC)     history of multiple  DVTs in 1989, 1989, 2004, on chronic anticoagulation with Coumadin  . Diverticulosis    noted colonoscopy 2010  . Eczema   . Fibroid uterus  2001    status post total abdominal hysterectomy, right and left salpingo-oophorectomy , done by Dr. [name]  . Heart murmur    as a child  . Hypertension   . PE (pulmonary embolism)     history of multiple PEs in 1984, 1989, 2004-  electronic data reviewed in [place] and EMR and I was not able to find single CT angiogram that was positive for pulmonary embolus nor was I able to find Doppler studies that were positive for DVTs  . 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 Neuropathy 01/28/2018  . 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 Dysphonia 12/19/2017  . Left hip pain 09/06/2017  . Nightmares 11/27/2016  . Obesity, morbid ([place]) 09/28/2016  . Chronic pain syndrome 05/18/2016  . Hyperlipidemia 01/27/2014  . Palpitations 12/30/2013  . Headache(784.0) 09/29/2013  . Bilateral knee pain 05/25/2013  . Metatarsalgia of both feet 05/25/2013  . Eczema 05/12/2013  . Insomnia 05/12/2013  . Diverticulosis 04/18/2012  . Ovarian cyst, left 04/16/2012  . Breast cyst 10/02/2011  . Arthritis 02/07/2011  . Essential hypertension 05/29/2006  . PULMONARY EMBOLISM, HX OF 05/29/2006    Past Surgical History:  Procedure Laterality Date  . ABDOMINAL HYSTERECTOMY  04/13/2010   done by Dr. [name]  . BREAST LUMPECTOMY WITH RADIOACTIVE SEED LOCALIZATION Right 07/09/2017   Procedure: RIGHT BREAST LUMPECTOMY WITH RADIOACTIVE SEED LOCALIZATION;  Surgeon: Erroll Luna, MD;  Location: [place];  Service: General;  Laterality: Right;  . CHOLECYSTECTOMY     1997  . MASTECTOMY, PARTIAL  03/2006    left partial mastectomy secondary to fibrocystic disease intraluminal fibroadenoma performed March 28, 2006 done by  Dr. [name]     OB History   None      Home Medications    Prior to Admission medications   Medication Sig Start Date End Date Taking? Authorizing Provider  albuterol (PROVENTIL HFA;VENTOLIN HFA) 108 (90 Base) MCG/ACT inhaler Inhale 1-2 puffs into the lungs every 6 (six) hours as needed for wheezing or shortness of breath. 02/17/18   Varney Biles, MD  amLODipine (NORVASC) 10 MG tablet Take 1 tablet (10 mg total) by mouth daily. 01/02/18 04/02/18  Lars Mage, MD  benzonatate (TESSALON) 100 MG capsule Take 1  capsule (100 mg total) by mouth every 8 (eight) hours. 02/17/18   Varney Biles, MD  calcium carbonate (OSCAL) 1500 (600 Ca) MG TABS tablet Take daily with breakfast by mouth.    [provider]  cyclobenzaprine (FLEXERIL) 10 MG tablet Take 10 mg 3 (three) times daily as needed by mouth for muscle spasms.    [provider]  diclofenac (VOLTAREN) 50 MG EC tablet Take 1 tablet (50 mg total) by mouth 2 (two) times daily. 09/05/17   Kalman Shan Ratliff, DO  diclofenac sodium (VOLTAREN) 1 % GEL Apply 2 g topically 4 (four) times daily. 04/11/17   Ina Homes, MD  ferrous sulfate 325 (65 FE) MG EC tablet Take 325 mg 3 (three) times daily with meals by mouth.    [provider]  gabapentin (NEURONTIN) 300 MG capsule Take 1 capsule (300 mg total) by mouth 3 (three) times daily. 01/28/18 01/28/19  Maryellen Pile, MD  HYDROcodone-acetaminophen (NORCO/VICODIN) 5-325 MG tablet Take 1-2 tablets by mouth every 6 (six) hours as needed for moderate pain. 07/09/17   Cornett, Marcello Moores, MD  ibuprofen (ADVIL,MOTRIN) 800 MG tablet Take 1 tablet (800 mg total) by mouth every 8 (eight) hours as needed. 12/19/17   Kathi Ludwig, MD  meloxicam (MOBIC) 7.5 MG tablet Take 1 tablet (7.5 mg total) by mouth daily. 04/06/17   Mesner, Corene Cornea, MD  Multiple Vitamin (MULTIVITAMIN) capsule Take 1 capsule daily by mouth.    [provider]  orlistat (XENICAL) 120 MG capsule Take 1 capsule (120 mg total) by mouth 3 (three) times daily with meals. 01/28/18   Maryellen Pile, MD  POTASSIUM GLUCONATE PO Take 99 mg every other day by mouth.    [provider]  predniSONE (DELTASONE) 10 MG tablet Take 6 tablets (60 mg total) by mouth daily. 02/17/18   Varney Biles, MD    Family History Family History  Problem Relation Age of Onset  . Cancer Unknown   . Clotting disorder Unknown   . Gout Father   . Stroke Neg Hx     Social History Social History   Tobacco Use  . Smoking status:  Never Smoker  . Smokeless tobacco: Never Used  Substance Use Topics  . Alcohol use: No    Alcohol/week: 0.0 oz    Comment: rare  . Drug use: No     Allergies   Dilaudid [hydromorphone hcl]   Review of Systems Review of Systems  Constitutional: Positive for activity change.  Respiratory: Positive for cough and shortness of breath.   Cardiovascular: Positive for chest pain.  Gastrointestinal: Negative for nausea and vomiting.  Skin: Negative for rash.  Allergic/Immunologic: Negative for immunocompromised state.  Hematological: Does not bruise/bleed easily.  All other systems reviewed and are negative.    Physical Exam Updated Vital Signs BP 131/85   Pulse 80   Temp 98.1 F (36.7 C) (Oral)   Resp 16  SpO2 93%   Physical Exam  Constitutional: She is oriented to person, place, and time. She appears well-developed.  HENT:  Head: Normocephalic and atraumatic.  Eyes: EOM are normal.  Neck: Normal range of motion. Neck supple.  Cardiovascular: Normal rate.  Pulmonary/Chest: Effort normal.  Abdominal: Bowel sounds are normal.  Musculoskeletal: She exhibits no edema.  Neurological: She is alert and oriented to person, place, and time.  Skin: Skin is warm and dry.  Nursing note and vitals reviewed.    ED Treatments / Results  Labs (all labs ordered are listed, but only abnormal results are displayed) Labs Reviewed  BASIC METABOLIC PANEL - Abnormal; Notable for the following components:      Result Value   Glucose, Bld 110 (*)    Creatinine, Ser 1.01 (*)    GFR calc non Af Amer 60 (*)    All other components within normal limits  CBC WITH DIFFERENTIAL/PLATELET  I-STAT TROPONIN, ED    EKG None  Radiology Dg Chest 2 View  Result Date: 02/17/2018 CLINICAL DATA:  Cough EXAM: CHEST - 2 VIEW COMPARISON:  04/06/2017 FINDINGS: The heart size and mediastinal contours are within normal limits. Both lungs are clear. The visualized skeletal structures are unremarkable.  IMPRESSION: No active cardiopulmonary disease. Electronically Signed   By: Donavan Foil M.D.   On: 02/17/2018 02:40   Ct Angio Chest Pe W And/or Wo Contrast  Result Date: 02/17/2018 CLINICAL DATA:  Cough with chest pain EXAM: CT ANGIOGRAPHY CHEST WITH CONTRAST TECHNIQUE: Multidetector CT imaging of the chest was performed using the standard protocol during bolus administration of intravenous contrast. Multiplanar CT image reconstructions and MIPs were obtained to evaluate the vascular anatomy. CONTRAST:  142mL ISOVUE-370 IOPAMIDOL (ISOVUE-370) INJECTION 76% COMPARISON:  CTA chest 01/06/2014 FINDINGS: Cardiovascular: --Pulmonary arteries: Contrast injection is sufficient to demonstrate satisfactory opacification of the pulmonary arteries to the segmental level. There is no pulmonary embolus. The main pulmonary artery is mildly enlarged, measuring 3.4 cm. --Aorta: Limited opacification of the aorta due to bolus timing optimization for the pulmonary arteries. Conventional 3 vessel aortic branching pattern. The aortic course and caliber are normal. There is no aortic atherosclerosis. --Heart: Mild cardiomegaly. No pericardial effusion. Mediastinum/Nodes: No mediastinal, hilar or axillary lymphadenopathy. The visualized thyroid and thoracic esophageal course are unremarkable. Lungs/Pleura: No pulmonary nodules or masses. No pleural effusion or pneumothorax. No focal airspace consolidation. No focal pleural abnormality. Upper Abdomen: Contrast bolus timing is not optimized for evaluation of the abdominal organs. Within this limitation, the visualized organs of the upper abdomen are normal. Musculoskeletal: No chest wall abnormality. No acute or significant osseous findings. Review of the MIP images confirms the above findings. IMPRESSION: 1. No pulmonary embolus or other acute thoracic abnormality. 2. Mildly enlarged main pulmonary artery may indicate pulmonary hypertension. 3. Cardiomegaly. Electronically Signed    By: Ulyses Jarred M.D.   On: 02/17/2018 04:46    Procedures Procedures (including critical care time)  Medications Ordered in ED Medications  benzonatate (TESSALON) capsule 200 mg (200 mg Oral Given 02/17/18 0256)  metoCLOPramide (REGLAN) injection 10 mg (10 mg Intravenous Given 02/17/18 0308)  sodium chloride 0.9 % bolus 1,000 mL (1,000 mLs Intravenous New Bag/Given 02/17/18 0307)  iopamidol (ISOVUE-370) 76 % injection 100 mL (100 mLs Intravenous Contrast Given 02/17/18 0421)     Initial Impression / Assessment and Plan / ED Course  I have reviewed the triage vital signs and the nursing notes.  Pertinent labs & imaging results that were available during  my care of the patient were reviewed by me and considered in my medical decision making (see chart for details).     57 year old female comes in with chief complaint of cough. Patient has history of PE, DVT not on any anticoagulant at this time.  She is also having pleuritic chest pain and has taken multiple over-the-counter medications without relief.  Patient is not on any ACE inhibitors, and history is not suggestive of underlying infection.  Chest x-ray does not show any pneumonia or concerning findings. Patient is high risk for PE, given the pleuritic pain and history of PE be ordered a CT PE which is negative for any acute process.  At this time, I am not sure what the cause for the cough that has been going on for 3 weeks's.  Patient will be started on medications to treat for bronchitis, in case she has cough variant bronchitis.  She has been advised to follow-up with her doctor in 1 week.  Final Clinical Impressions(s) / ED Diagnoses   Final diagnoses:  Pleuritic chest pain  Viral URI with cough    ED Discharge Orders        Ordered    benzonatate (TESSALON) 100 MG capsule  Every 8 hours     02/17/18 0519    predniSONE (DELTASONE) 10 MG tablet  Daily     02/17/18 0519    albuterol (PROVENTIL HFA;VENTOLIN HFA) 108 (90 Base)  MCG/ACT inhaler  Every 6 hours PRN     02/17/18 0519       Varney Biles, MD 02/17/18 [phone]

## 2018-02-17 NOTE — ED Triage Notes (Signed)
Pt reporting a worsening cough x3 weeks. She states that the cough is causing chest, back, and head pain. She also reports a crawling or itching sensation on her upper lip. A&Ox4. Ambulatory.

## 2018-02-17 NOTE — ED Notes (Signed)
Patient transported to CT 

## 2018-02-18 ENCOUNTER — Telehealth: Payer: Self-pay

## 2018-02-18 NOTE — Telephone Encounter (Signed)
States she's at a doctor's office and will call back.

## 2018-02-18 NOTE — Telephone Encounter (Signed)
Questions about bp med. Please call pt back.

## 2018-02-19 NOTE — Telephone Encounter (Signed)
F/U call from yesterday - no answer; but has an appt schedule on Monday per Epic. Left message to call back if needed.

## 2018-02-20 NOTE — Telephone Encounter (Signed)
Thank you for doing that Ms. Glenda. I will see her Monday!

## 2018-02-24 ENCOUNTER — Ambulatory Visit: Payer: Medicaid Other

## 2018-02-24 ENCOUNTER — Encounter: Payer: Self-pay | Admitting: Internal Medicine

## 2018-02-25 ENCOUNTER — Ambulatory Visit: Payer: Medicaid Other | Admitting: Physical Therapy

## 2018-02-27 ENCOUNTER — Encounter: Payer: Medicaid Other | Admitting: Physical Therapy

## 2018-02-28 ENCOUNTER — Ambulatory Visit: Payer: Medicaid Other | Attending: Physician Assistant | Admitting: Physical Therapy

## 2018-02-28 DIAGNOSIS — M25662 Stiffness of left knee, not elsewhere classified: Secondary | ICD-10-CM

## 2018-02-28 DIAGNOSIS — M25561 Pain in right knee: Secondary | ICD-10-CM | POA: Diagnosis present

## 2018-02-28 DIAGNOSIS — G8929 Other chronic pain: Secondary | ICD-10-CM

## 2018-02-28 DIAGNOSIS — M25552 Pain in left hip: Secondary | ICD-10-CM | POA: Diagnosis present

## 2018-02-28 DIAGNOSIS — M25562 Pain in left knee: Secondary | ICD-10-CM | POA: Diagnosis present

## 2018-02-28 DIAGNOSIS — M6281 Muscle weakness (generalized): Secondary | ICD-10-CM

## 2018-02-28 NOTE — Therapy (Signed)
[facility] [address] [place], [place], [address] Phone: [phone]   Fax:  [phone]  Physical Therapy Treatment  Patient Details  Name: [name] MRN: [id number] Date of Birth: 05/05/1961 Referring Provider: Kathi Ludwig, MD   Encounter Date: 02/28/2018  PT End of Session - 02/28/18 0938    Visit Number  5    Date for PT Re-Evaluation  04/01/18    Authorization Type  medicaid - re-eval 6/12    PT Start Time  0935    PT Stop Time  1014    PT Time Calculation (min)  39 min    Activity Tolerance  Patient tolerated treatment well    Behavior During Therapy  [facility] for tasks assessed/performed       Past Medical History:  Diagnosis Date  . Anemia 2009    Baseline hemoglobin at 11, secondary to iron deficiency  . Arthritis   . Breast mass 02/2008    Noted on mammogram March 11, 2008 3 cm simple cyst at 9 to 10:00 position of the right breast as well as multiple other smaller simple cyst, 2.6 cm mass at the 10:00 position of the right breast also representing complex cyst,  . Deep vein thrombosis (DVT) (HCC)     history of multiple  DVTs in 1989, 1989, 2004, on chronic anticoagulation with Coumadin  . Diverticulosis    noted colonoscopy 2010  . Eczema   . Fibroid uterus  2001    status post total abdominal hysterectomy, right and left salpingo-oophorectomy , done by Dr. [name]  . Heart murmur    as a child  . Hypertension   . PE (pulmonary embolism)     history of multiple PEs in 1984, 1989, 2004-  electronic data reviewed in [place] and EMR and I was not able to find single CT angiogram that was positive for pulmonary embolus nor was I able to find Doppler studies that were positive for DVTs  . 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 ABDOMINAL HYSTERECTOMY  04/13/2010   done by Dr. [name]  . BREAST LUMPECTOMY WITH RADIOACTIVE SEED LOCALIZATION Right 07/09/2017   Procedure: RIGHT BREAST LUMPECTOMY WITH RADIOACTIVE SEED LOCALIZATION;  Surgeon: Erroll Luna, MD;  Location: [place];  Service: General;  Laterality: Right;  . CHOLECYSTECTOMY     1997  . MASTECTOMY, PARTIAL  03/2006    left partial mastectomy secondary to fibrocystic disease intraluminal fibroadenoma performed March 28, 2006 done by Dr. [name]    There were no vitals filed for this visit.  Subjective Assessment - 02/28/18 0939    Subjective  "I wish I knew how to work the equipment in the gym at my apartment".   she reports no difference in knee pain.  She is doing a lot of walking and trying to change her diet.   Her appts have to be spread out because she is helping her sister who has cancer, receiving treatments.    Currently in Pain?  Yes    Pain Score  6     Pain Location  Knee    Pain Orientation  Left;Right    Pain Descriptors / Indicators  Aching    Aggravating  Factors   sitting still too long    Pain Relieving Factors  medication          [facility] PT Assessment - 02/28/18 0001      Assessment   Medical Diagnosis  M25.561,M25.562,G89.29 (ICD-10-CM) - Chronic pain of both knees;G89.4 (ICD-10-CM) - Chronic pain syndrome;M25.552 (ICD-10-CM) - Left hip pain      AROM   Right Knee Flexion  109 sitting    Left Knee Flexion  87 sitting      Timed Up and Go Test   Normal TUG (seconds)  13.42       [facility] Adult PT Treatment/Exercise - 02/28/18 0001      Self-Care   Other Self-Care Comments   re-educated pt regarding self massage with rolling stick and hands; pt verbalized understanding      Knee/Hip Exercises: Stretches   Active Hamstring Stretch  Right;Left;2 reps;30 seconds seated, 30-45 sec     Quad Stretch  Right;Left;2 reps;60 seconds;Limitations seated, foot under chair    Quad Stretch Limitations  trial of standing with foot on chair behind her, 10 sec each leg; poor tolerance    Hip Flexor Stretch  Right;Left;2 reps;20 seconds supine wiht leg off of bed    Gastroc Stretch  Right;Left;2 reps;30 seconds runners stretch      Knee/Hip Exercises: Aerobic   Nustep  L1 x 5 min PTA present to discuss progress and monitor      Knee/Hip Exercises: Standing   Hip Abduction  Stengthening;Right;Left;2 sets;5 reps;Knee straight with UE support    Functional Squat  1 set;10 reps mini, with UE support      Knee/Hip Exercises: Seated   Long Arc Quad  AROM;Right;Left;2 sets;5 reps      Modalities   Modalities  -- pt declined        PT Long Term Goals - 02/28/18 1128      PT LONG TERM GOAL #1   Title  ind with advanced HEP    Time  12    Period  Weeks    Status  On-going      PT LONG TERM GOAL #2   Title  TUG < or = to 12 sec for reduced risk of falls    Time  12    Period  Weeks    Status  On-going 13.42 seconds 02/28/18      PT LONG TERM GOAL #3   Title  left knee will have at least 100 degrees of flexion for improved functional gait    Time  12    Period  Weeks    Status  On-going      PT LONG TERM GOAL #4   Title  patient will report knee and hip pain reduced by > or = to 40% for improved ability to do chores around the house    Baseline  no reduction yet    Time  12    Period  Weeks    Status  On-going      PT LONG TERM GOAL #5   Time  12    Period  Weeks    Status  On-going            Plan - 02/28/18 1016    Clinical Impression Statement  Pt performed TUG in slightly less time than last assessment.  Knee ROM remains limited, Lt knee slightly less than last visit.  Encouraged pt to participate in stretching exercises on daily basis to improve knee ROM and  mobility.  Pt declined modalities; will try heat on knees when she arrives home.  Pt making gradual  progress towards goals.     Rehab Potential  Good    PT Frequency  2x / week    PT Duration  12 weeks    PT Treatment/Interventions  ADLs/Self Care Home Management;Biofeedback;Cryotherapy;Electrical Stimulation;Iontophoresis 4mg /ml Dexamethasone;Moist Heat;Ultrasound;Gait training;Stair training;Functional mobility training;Therapeutic activities;Therapeutic exercise;Balance training;Neuromuscular re-education;Patient/family education;Passive range of motion;Manual techniques;Dry needling;Taping    PT Next Visit Plan  progress LE strength and ROM as tolerated    Consulted and Agree with Plan of Care  Patient       Patient will benefit from skilled therapeutic intervention in order to improve the following deficits and impairments:  Abnormal gait, Decreased activity tolerance, Decreased strength, Increased fascial restricitons, Pain, Difficulty walking, Impaired flexibility, Decreased range of motion, Increased muscle spasms  Visit Diagnosis: Muscle weakness (generalized)  Chronic pain of right knee  Acute pain of left knee  Stiffness of left knee, not elsewhere classified  Pain in left hip     Problem List Patient Active Problem List   Diagnosis Date Noted  . Neuropathy 01/28/2018  . 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 Dysphonia 12/19/2017  . Left hip pain 09/06/2017  . Nightmares 11/27/2016  . Obesity, morbid ([place]) 09/28/2016  . Chronic pain syndrome 05/18/2016  . Hyperlipidemia 01/27/2014  . Palpitations 12/30/2013  . Headache(784.0) 09/29/2013  . Bilateral knee pain 05/25/2013  . Metatarsalgia of both feet 05/25/2013  . Eczema 05/12/2013  . Insomnia 05/12/2013  . Diverticulosis 04/18/2012  . Ovarian cyst, left 04/16/2012  . Breast cyst 10/02/2011  . Arthritis 02/07/2011  . Essential hypertension 05/29/2006  . PULMONARY EMBOLISM, HX OF 05/29/2006   Kerin Perna, PTA 02/28/18 11:33 AM  [facility] [address] [place], [place], [address] Phone: [phone]   Fax:  [phone]  Name: CHARNIKA HERBST MRN: [id number] Date of Birth: 05-24-1961

## 2018-03-04 ENCOUNTER — Encounter: Payer: Self-pay | Admitting: Physical Therapy

## 2018-03-04 ENCOUNTER — Ambulatory Visit: Payer: Medicaid Other | Admitting: Physical Therapy

## 2018-03-04 DIAGNOSIS — M25662 Stiffness of left knee, not elsewhere classified: Secondary | ICD-10-CM

## 2018-03-04 DIAGNOSIS — M6281 Muscle weakness (generalized): Secondary | ICD-10-CM

## 2018-03-04 DIAGNOSIS — M25561 Pain in right knee: Secondary | ICD-10-CM

## 2018-03-04 DIAGNOSIS — M25562 Pain in left knee: Secondary | ICD-10-CM

## 2018-03-04 DIAGNOSIS — M25552 Pain in left hip: Secondary | ICD-10-CM

## 2018-03-04 DIAGNOSIS — G8929 Other chronic pain: Secondary | ICD-10-CM

## 2018-03-04 NOTE — Therapy (Signed)
[facility] [address], [place], [place], [address] Phone: [phone]   Fax:  [phone]  Physical Therapy Treatment  Patient Details  Name: [name] MRN: [id number] Date of Birth: 02/23/1961 Referring Provider: Kathi Ludwig, MD   Encounter Date: 03/04/2018  PT End of Session - 03/04/18 0911    Visit Number  6    Date for PT Re-Evaluation  04/01/18    Authorization Type  medicaid 12 visits from 6/24-8/23    Authorization - Number of Visits  12    PT Start Time  0853    PT Stop Time  0923 needs to leave early for an appt    PT Time Calculation (min)  30 min    Activity Tolerance  Patient tolerated treatment well       Past Medical History:  Diagnosis Date  . Anemia 2009    Baseline hemoglobin at 11, secondary to iron deficiency  . Arthritis   . Breast mass 02/2008    Noted on mammogram March 11, 2008 3 cm simple cyst at 9 to 10:00 position of the right breast as well as multiple other smaller simple cyst, 2.6 cm mass at the 10:00 position of the right breast also representing complex cyst,  . Deep vein thrombosis (DVT) (HCC)     history of multiple  DVTs in 1989, 1989, 2004, on chronic anticoagulation with Coumadin  . Diverticulosis    noted colonoscopy 2010  . Eczema   . Fibroid uterus  2001    status post total abdominal hysterectomy, right and left salpingo-oophorectomy , done by Dr. [name]  . Heart murmur    as a child  . Hypertension   . PE (pulmonary embolism)     history of multiple PEs in 1984, 1989, 2004-  electronic data reviewed in [place] and EMR and I was not able to find single CT angiogram that was positive for pulmonary embolus nor was I able to find Doppler studies that were positive for DVTs  . 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 ABDOMINAL HYSTERECTOMY  04/13/2010   done by Dr. [name]  . BREAST LUMPECTOMY WITH RADIOACTIVE SEED LOCALIZATION Right 07/09/2017   Procedure: RIGHT BREAST LUMPECTOMY WITH RADIOACTIVE SEED LOCALIZATION;  Surgeon: Erroll Luna, MD;  Location: [place];  Service: General;  Laterality: Right;  . CHOLECYSTECTOMY     1997  . MASTECTOMY, PARTIAL  03/2006    left partial mastectomy secondary to fibrocystic disease intraluminal fibroadenoma performed March 28, 2006 done by Dr. [name]    There were no vitals filed for this visit.  Subjective Assessment - 03/04/18 0855    Subjective  I feel pretty good today.  No pain today.   Trying to walk regularly about 30 minutes on the treadmill.  I have several doctor's appointments today and then I drive to [employer].  My sister's cancer surgery is tomorrow.  I want to check my TUG test again to see if I got faster.      Currently in Pain?  No/denies    Pain Score  0-No pain    Pain Location  Knee    Pain Type  Chronic pain    Pain  Relieving Factors  exercises I did last week                       [facility] Adult PT Treatment/Exercise - 03/04/18 0001      Knee/Hip Exercises: Stretches   Other Knee/Hip Stretches  2nd step hip flexor stretch 5x    Other Knee/Hip Stretches  2nd step HS stretch 5x bil       Knee/Hip Exercises: Aerobic   Nustep  L1 x 9 min  while discussing progress toward goals      Knee/Hip Exercises: Standing   Heel Raises  Both;10 reps    Hip Abduction  Stengthening;Right;Left;2 sets;5 reps;Knee straight with UE support    Hip Extension  AROM;Right;Left;10 reps      Knee/Hip Exercises: Seated   Long Arc Quad  Strengthening;Right;Left;1 set;10 reps;Weights    Long Arc Quad Weight  2 lbs.    Clamshell with TheraBand  Green 20x     Knee/Hip Flexion  2# 10x right/left    Sit to [employer]  10 reps from foam pad on mat             PT Education - 03/04/18 0920    Education provided  Yes    Education Details   Access Code: [facility]     Person(s) Educated  Patient    Methods  Explanation;Demonstration;Handout    Comprehension  Returned demonstration;Verbalized understanding          PT Long Term Goals - 02/28/18 1128      PT LONG TERM GOAL #1   Title  ind with advanced HEP    Time  12    Period  Weeks    Status  On-going      PT LONG TERM GOAL #2   Title  TUG < or = to 12 sec for reduced risk of falls    Time  12    Period  Weeks    Status  On-going 13.47 seconds 02/28/18      PT LONG TERM GOAL #3   Title  left knee will have at least 100 degrees of flexion for improved functional gait    Time  12    Period  Weeks    Status  On-going      PT LONG TERM GOAL #4   Title  patient will report knee and hip pain reduced by > or = to 40% for improved ability to do chores around the house    Baseline  no reduction yet    Time  12    Period  Weeks    Status  On-going      PT LONG TERM GOAL #5   Time  12    Period  Weeks    Status  On-going            Plan - 03/04/18 0920    Clinical Impression Statement  The patient is able to is able to participate in low level strengthening in open and close chain positions.  She complains of her right LE feeling much weaker than left in standing.  Therapist closely monitoring response and modifying as needed for pain or muscular fatigue.  She needs to leave a little early to get to a doctor's appointment.  She is also driving to [place] to be with her sister who is having surgery tomorrow.      Rehab Potential  Good    PT Frequency  2x / week  PT Duration  12 weeks    PT Treatment/Interventions  ADLs/Self Care Home Management;Biofeedback;Cryotherapy;Electrical Stimulation;Iontophoresis 4mg /ml Dexamethasone;Moist Heat;Ultrasound;Gait training;Stair  training;Functional mobility training;Therapeutic activities;Therapeutic exercise;Balance training;Neuromuscular re-education;Patient/family education;Passive range of motion;Manual techniques;Dry needling;Taping    PT Next Visit Plan  progress LE strength and ROM as tolerated    PT Home Exercise Plan   Access Code: [facility]        Patient will benefit from skilled therapeutic intervention in order to improve the following deficits and impairments:  Abnormal gait, Decreased activity tolerance, Decreased strength, Increased fascial restricitons, Pain, Difficulty walking, Impaired flexibility, Decreased range of motion, Increased muscle spasms  Visit Diagnosis: Muscle weakness (generalized)  Chronic pain of right knee  Acute pain of left knee  Stiffness of left knee, not elsewhere classified  Pain in left hip     Problem List Patient Active Problem List   Diagnosis Date Noted  . Neuropathy 01/28/2018  . 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 Dysphonia 12/19/2017  . Left hip pain 09/06/2017  . Nightmares 11/27/2016  . Obesity, morbid ([place]) 09/28/2016  . Chronic pain syndrome 05/18/2016  . Hyperlipidemia 01/27/2014  . Palpitations 12/30/2013  . Headache(784.0) 09/29/2013  . Bilateral knee pain 05/25/2013  . Metatarsalgia of both feet 05/25/2013  . Eczema 05/12/2013  . Insomnia 05/12/2013  . Diverticulosis 04/18/2012  . Ovarian cyst, left 04/16/2012  . Breast cyst 10/02/2011  . Arthritis 02/07/2011  . Essential hypertension 05/29/2006  . PULMONARY EMBOLISM, HX OF 05/29/2006   Ruben Im, PT 03/04/18 6:38 PM Phone: [phone] Fax: [phone]  Alvera Singh 03/04/2018, 6:38 PM  [facility] [address] [place], [place], [address] Phone: [phone]   Fax:  [phone]  Name: BERDINE RASMUSSON MRN: [id number] Date of Birth: Apr 30, 1961

## 2018-03-07 ENCOUNTER — Ambulatory Visit: Payer: Medicaid Other | Admitting: Physical Therapy

## 2018-03-14 ENCOUNTER — Encounter: Payer: Self-pay | Admitting: Physical Therapy

## 2018-03-14 ENCOUNTER — Ambulatory Visit: Payer: Medicaid Other | Admitting: Physical Therapy

## 2018-03-14 DIAGNOSIS — M25561 Pain in right knee: Secondary | ICD-10-CM

## 2018-03-14 DIAGNOSIS — M25562 Pain in left knee: Secondary | ICD-10-CM

## 2018-03-14 DIAGNOSIS — M6281 Muscle weakness (generalized): Secondary | ICD-10-CM | POA: Diagnosis not present

## 2018-03-14 DIAGNOSIS — M25552 Pain in left hip: Secondary | ICD-10-CM

## 2018-03-14 DIAGNOSIS — M25662 Stiffness of left knee, not elsewhere classified: Secondary | ICD-10-CM

## 2018-03-14 DIAGNOSIS — G8929 Other chronic pain: Secondary | ICD-10-CM

## 2018-03-14 NOTE — Therapy (Signed)
[facility] [address] [place], [place], [address] Phone: [phone]   Fax:  [phone]  Physical Therapy Treatment  Patient Details  Name: [name] MRN: [id number] Date of Birth: 23-Apr-1961 Referring Provider: Kathi Ludwig, MD   Encounter Date: 03/14/2018  PT End of Session - 03/14/18 1041    Visit Number  7    Date for PT Re-Evaluation  04/01/18    Authorization Type  medicaid 12 visits from 6/24-8/23    Authorization - Number of Visits  12    PT Start Time  1019    PT Stop Time  1100    PT Time Calculation (min)  41 min    Activity Tolerance  Patient tolerated treatment well       Past Medical History:  Diagnosis Date  . Anemia 2009    Baseline hemoglobin at 11, secondary to iron deficiency  . Arthritis   . Breast mass 02/2008    Noted on mammogram March 11, 2008 3 cm simple cyst at 9 to 10:00 position of the right breast as well as multiple other smaller simple cyst, 2.6 cm mass at the 10:00 position of the right breast also representing complex cyst,  . Deep vein thrombosis (DVT) (HCC)     history of multiple  DVTs in 1989, 1989, 2004, on chronic anticoagulation with Coumadin  . Diverticulosis    noted colonoscopy 2010  . Eczema   . Fibroid uterus  2001    status post total abdominal hysterectomy, right and left salpingo-oophorectomy , done by Dr. [name]  . Heart murmur    as a child  . Hypertension   . PE (pulmonary embolism)     history of multiple PEs in 1984, 1989, 2004-  electronic data reviewed in [place] and EMR and I was not able to find single CT angiogram that was positive for pulmonary embolus nor was I able to find Doppler studies that were positive for DVTs  . 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 ABDOMINAL HYSTERECTOMY  04/13/2010   done by Dr. [name]  . BREAST LUMPECTOMY WITH RADIOACTIVE SEED LOCALIZATION Right 07/09/2017   Procedure: RIGHT BREAST LUMPECTOMY WITH RADIOACTIVE SEED LOCALIZATION;  Surgeon: Erroll Luna, MD;  Location: [place];  Service: General;  Laterality: Right;  . CHOLECYSTECTOMY     1997  . MASTECTOMY, PARTIAL  03/2006    left partial mastectomy secondary to fibrocystic disease intraluminal fibroadenoma performed March 28, 2006 done by Dr. [name]    There were no vitals filed for this visit.  Subjective Assessment - 03/14/18 1021    Subjective  I've been driving back and forth from [place] and [place].  I did my leg exercises while I was with my sister in the hospital.  In my knee it's Ok today.  I'm emotionally drained.  Her sister has cancer and had her kidney removed.      Currently in Pain?  No/denies    Pain Score  0-No pain    Pain Location  Knee    Pain Orientation  Right;Left    Pain Type  Chronic pain    Aggravating Factors   activity  [facility] PT Assessment - 03/14/18 0001      Strength   Right Hip Flexion  4+/5    Right Hip External Rotation   4+/5    Right Hip ABduction  4+/5    Right Hip ADduction  4/5    Left Hip Flexion  4/5    Right Knee Flexion  4/5    Right Knee Extension  4/5    Left Knee Flexion  4/5    Left Knee Extension  4/5                   [facility] Adult PT Treatment/Exercise - 03/14/18 0001      Knee/Hip Exercises: Stretches   Other Knee/Hip Stretches  2nd step hip flexor stretch 5x    Other Knee/Hip Stretches  2nd step HS stretch 5x bil       Knee/Hip Exercises: Aerobic   Nustep  L1 10 min  while discussing progress with goals      Knee/Hip Exercises: Standing   Heel Raises  Both;10 reps    Hip Abduction  Stengthening;Right;Left;2 sets;5 reps;Knee straight with  UE support    Hip Extension  AROM;Right;Left;10 reps    Forward Step Up  Right;Left;10 reps;Hand Hold: 2;Step Height: 6"      Knee/Hip Exercises: Seated   Long Arc Quad  Strengthening;Right;Left;1 set;20 reps;Weights    Long Arc Quad Weight  2 lbs.    Clamshell with TheraBand  Green 20x    Knee/Hip Flexion  2# 10x right/left    Marching  Strengthening;Right;Left;20 reps;Weights    Marching Weights  2 lbs.    Sit to [place]  10 reps higher table; green band around thighs                  PT Long Term Goals - 03/14/18 1109      PT LONG TERM GOAL #1   Title  ind with advanced HEP    Time  12    Period  Weeks    Status  On-going      PT LONG TERM GOAL #2   Title  TUG < or = to 12 sec for reduced risk of falls    Time  12    Period  Weeks    Status  On-going      PT LONG TERM GOAL #3   Title  left knee will have at least 100 degrees of flexion for improved functional gait    Time  12    Period  Weeks    Status  On-going      PT LONG TERM GOAL #4   Title  patient will report knee and hip pain reduced by > or = to 40% for improved ability to do chores around the house    Time  12    Period  Weeks    Status  On-going      PT LONG TERM GOAL #5   Title  pt will demonstrate LE strength of 4+/5 throughout bilateral LE for improved functional standing actvities.    Time  12    Period  Weeks    Status  On-going            Plan - 03/14/18 1044    Clinical Impression Statement  The patient is able to perform a progression of LE strengthening without an exacerbation of pain.    Therapist providing cues for patellofemoral alignment and to monitor pain and for excessive muscle fatigue.  She  reports a high level of stress and general fatigue traveling to take care of her sister with cancer.  Good improvements in LE strength.      Rehab Potential  Good    PT Frequency  2x / week    PT Duration  12 weeks    PT Treatment/Interventions  ADLs/Self Care Home  Management;Biofeedback;Cryotherapy;Electrical Stimulation;Iontophoresis 4mg /ml Dexamethasone;Moist Heat;Ultrasound;Gait training;Stair training;Functional mobility training;Therapeutic activities;Therapeutic exercise;Balance training;Neuromuscular re-education;Patient/family education;Passive range of motion;Manual techniques;Dry needling;Taping    PT Next Visit Plan  progress LE strength and ROM as tolerated;  recheck ROM; TUG    PT Home Exercise Plan   Access Code: [facility]        Patient will benefit from skilled therapeutic intervention in order to improve the following deficits and impairments:  Abnormal gait, Decreased activity tolerance, Decreased strength, Increased fascial restricitons, Pain, Difficulty walking, Impaired flexibility, Decreased range of motion, Increased muscle spasms  Visit Diagnosis: Muscle weakness (generalized)  Chronic pain of right knee  Acute pain of left knee  Stiffness of left knee, not elsewhere classified  Pain in left hip     Problem List Patient Active Problem List   Diagnosis Date Noted  . Neuropathy 01/28/2018  . 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 Dysphonia 12/19/2017  . Left hip pain 09/06/2017  . Nightmares 11/27/2016  . Obesity, morbid ([place]) 09/28/2016  . Chronic pain syndrome 05/18/2016  . Hyperlipidemia 01/27/2014  . Palpitations 12/30/2013  . Headache(784.0) 09/29/2013  . Bilateral knee pain 05/25/2013  . Metatarsalgia of both feet 05/25/2013  . Eczema 05/12/2013  . Insomnia 05/12/2013  . Diverticulosis 04/18/2012  . Ovarian cyst, left 04/16/2012  . Breast cyst 10/02/2011  . Arthritis 02/07/2011  . Essential hypertension 05/29/2006  . PULMONARY EMBOLISM, HX OF 05/29/2006   Ruben Im, PT 03/14/18 11:10 AM Phone: [phone] Fax: [phone]  Alvera Singh 03/14/2018, 11:10 AM  [facility] [address] [place], [place], [address] Phone:  [phone]   Fax:  [phone]  Name: [name] MRN: [id number] Date of Birth: [birth year]

## 2018-03-21 ENCOUNTER — Encounter: Payer: Self-pay | Admitting: Physical Therapy

## 2018-03-21 ENCOUNTER — Ambulatory Visit: Payer: Medicaid Other | Attending: Physician Assistant | Admitting: Physical Therapy

## 2018-03-21 DIAGNOSIS — M25561 Pain in right knee: Secondary | ICD-10-CM | POA: Diagnosis present

## 2018-03-21 DIAGNOSIS — M25662 Stiffness of left knee, not elsewhere classified: Secondary | ICD-10-CM | POA: Diagnosis present

## 2018-03-21 DIAGNOSIS — M6281 Muscle weakness (generalized): Secondary | ICD-10-CM | POA: Insufficient documentation

## 2018-03-21 DIAGNOSIS — M25562 Pain in left knee: Secondary | ICD-10-CM | POA: Diagnosis present

## 2018-03-21 DIAGNOSIS — M25552 Pain in left hip: Secondary | ICD-10-CM | POA: Diagnosis present

## 2018-03-21 DIAGNOSIS — G8929 Other chronic pain: Secondary | ICD-10-CM | POA: Insufficient documentation

## 2018-03-21 NOTE — Therapy (Addendum)
[facility] [address], [place], [place], [address] Phone: [phone]   Fax:  [phone]  Physical Therapy Treatment/Discharge Summary   Patient Details  Name: [name] MRN: [id number] Date of Birth: [birth year] Referring Provider: Kathi Ludwig, MD   Encounter Date: 03/21/2018  PT End of Session - 03/21/18 0948    Visit Number  8    Date for PT Re-Evaluation  04/01/18    Authorization Type  medicaid 12 visits from 6/24-8/23    Authorization - Number of Visits  12    PT Start Time  0934    PT Stop Time  1015    PT Time Calculation (min)  41 min    Activity Tolerance  Patient tolerated treatment well       Past Medical History:  Diagnosis Date  . Anemia 2009    Baseline hemoglobin at 11, secondary to iron deficiency  . Arthritis   . Breast mass 02/2008    Noted on mammogram March 11, 2008 3 cm simple cyst at 9 to 10:00 position of the right breast as well as multiple other smaller simple cyst, 2.6 cm mass at the 10:00 position of the right breast also representing complex cyst,  . Deep vein thrombosis (DVT) (HCC)     history of multiple  DVTs in 1989, 1989, 2004, on chronic anticoagulation with Coumadin  . Diverticulosis    noted colonoscopy 2010  . Eczema   . Fibroid uterus  2001    status post total abdominal hysterectomy, right and left salpingo-oophorectomy , done by Dr. [name]  . Heart murmur    as a child  . Hypertension   . PE (pulmonary embolism)     history of multiple PEs in 1984, 1989, 2004-  electronic data reviewed in [place] and EMR and I was not able to find single CT angiogram that was positive for pulmonary embolus nor was I able to find Doppler studies that were positive for DVTs  . 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 ABDOMINAL HYSTERECTOMY  04/13/2010   done by Dr. [name]  . BREAST LUMPECTOMY WITH RADIOACTIVE SEED LOCALIZATION Right 07/09/2017   Procedure: RIGHT BREAST LUMPECTOMY WITH RADIOACTIVE SEED LOCALIZATION;  Surgeon: Erroll Luna, MD;  Location: [facility];  Service: General;  Laterality: Right;  . CHOLECYSTECTOMY     1997  . MASTECTOMY, PARTIAL  03/2006    left partial mastectomy secondary to fibrocystic disease intraluminal fibroadenoma performed March 28, 2006 done by Dr. [name]    There were no vitals filed for this visit.  Subjective Assessment - 03/21/18 0930    Subjective  Doing pretty good today.  I'm just tired.  I'm not in pain this morning.      Patient Stated Goals  feeling better when walking    Currently in Pain?  No/denies    Pain Score  0-No pain    Pain Location  Knee    Pain Orientation  Right;Left    Pain Type  Chronic pain         [facility] PT Assessment - 03/21/18 0001      AROM   Right Knee Extension  0    Right Knee Flexion  115    Left Knee Extension  0    Left Knee Flexion  80      Strength   Right Hip Flexion  4+/5    Right Hip External Rotation   4+/5    Right Hip ABduction  4+/5    Right Hip ADduction  4/5    Left Hip Flexion  4/5    Right Knee Flexion  4/5    Right Knee Extension  4/5    Left Knee Flexion  4/5    Left Knee Extension  4/5      Timed Up and Go Test   Normal TUG (seconds)  12.2                   [facility] Adult PT Treatment/Exercise - 03/21/18 0001      Knee/Hip Exercises: Stretches   Other Knee/Hip Stretches  2nd step hip flexor stretch 5x    Other Knee/Hip Stretches  2nd step HS stretch 5x bil       Knee/Hip Exercises: Aerobic   Nustep  L2 10 min       Knee/Hip Exercises: Machines for Strengthening   Total Gym Leg Press  55# bil 20x, 30# single leg s 20x each       Knee/Hip Exercises: Standing   Hip Abduction  Stengthening;Right;Left;10 reps;Knee straight    Abduction Limitations  yellow band     Hip Extension  Stengthening;Right;Left;10 reps    Extension Limitations  yellow    Forward Step Up  Right;Left;10 reps;Hand Hold: 2;Step Height: 6"      Knee/Hip Exercises: Seated   Long Arc Quad  Strengthening;Right;Left;1 set;20 reps;Weights    Long Arc Quad Weight  2 lbs.    Knee/Hip Flexion  2# 10x right/left    Sit to Sand  10 reps mat table 5x             PT Education - 03/21/18 1010    Education Details   Access Code: [email or web address]  standing hip abduction with band , extension with band yellow    Person(s) Educated  Patient    Methods  Explanation;Demonstration;Handout    Comprehension  Returned demonstration;Verbalized understanding          PT Long Term Goals - 03/21/18 0955      PT LONG TERM GOAL #1   Title  ind with advanced HEP    Time  12    Period  Weeks    Status  On-going      PT LONG TERM GOAL #2   Title  TUG < or = to 12 sec for reduced risk of falls    Time  12    Period  Weeks    Status  On-going      PT LONG TERM GOAL #3   Title  left knee will have at least 100 degrees of flexion for improved functional gait    Time  12    Period  Weeks    Status  On-going      PT LONG TERM GOAL #4   Title  patient will report knee and hip pain reduced by > or = to 40% for improved ability to do chores around the house    Time  12    Period  Weeks    Status  On-going      PT LONG TERM GOAL #5   Title  pt will demonstrate LE strength of 4+/5 throughout bilateral LE for improved functional standing  actvities.    Time  12    Period  Weeks    Status  On-going            Plan - 03/21/18 0954    Clinical Impression Statement  Right knee flexion ROM much improved however left knee ROM continues to be very limited.  Discussed HEP to focus on frequent ROM on left every 2 hours this week.  Able to progress  strengthening exercises with increased resistance and the addition of the leg press with minor left knee discomfort only.  Good improvement in Timed up and Go test.  Recheck remaining progress toward goals next.      Rehab Potential  Good    PT Frequency  2x / week    PT Duration  12 weeks    PT Treatment/Interventions  ADLs/Self Care Home Management;Biofeedback;Cryotherapy;Electrical Stimulation;Iontophoresis '4mg'$ /ml Dexamethasone;Moist Heat;Ultrasound;Gait training;Stair training;Functional mobility training;Therapeutic activities;Therapeutic exercise;Balance training;Neuromuscular re-education;Patient/family education;Passive range of motion;Manual techniques;Dry needling;Taping    PT Next Visit Plan  LE strengthening;  1-2 more visits;  recheck left knee ROM and TUG next week    PT Home Exercise Plan   Access Code: [facility]        Patient will benefit from skilled therapeutic intervention in order to improve the following deficits and impairments:  Abnormal gait, Decreased activity tolerance, Decreased strength, Increased fascial restricitons, Pain, Difficulty walking, Impaired flexibility, Decreased range of motion, Increased muscle spasms  Visit Diagnosis: Muscle weakness (generalized)  Chronic pain of right knee  Acute pain of left knee  Stiffness of left knee, not elsewhere classified  Pain in left hip  PHYSICAL THERAPY DISCHARGE SUMMARY  Visits from Start of Care: 8 Current functional level related to goals / functional outcomes: The patient no-showed for last scheduled appointment in early August and has not called to reschedule.  She had made good progress toward goals in 8 visits and we anticipated discharge in another visit or 2.  Will discharge at this time from PT.    Remaining deficits: As above   Education / Equipment: HEP Plan:                                                    Patient goals were partially met. Patient is being discharged due to not returning since  the last visit.  ?????           Problem List Patient Active Problem List   Diagnosis Date Noted  . Neuropathy 01/28/2018  . 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 Dysphonia 12/19/2017  . Left hip pain 09/06/2017  . Nightmares 11/27/2016  . Obesity, morbid ([place]) 09/28/2016  . Chronic pain syndrome 05/18/2016  . Hyperlipidemia 01/27/2014  . Palpitations 12/30/2013  . Headache(784.0) 09/29/2013  . Bilateral knee pain 05/25/2013  . Metatarsalgia of both feet 05/25/2013  . Eczema 05/12/2013  . Insomnia 05/12/2013  . Diverticulosis 04/18/2012  . Ovarian cyst, left 04/16/2012  . Breast cyst 10/02/2011  . Arthritis 02/07/2011  . Essential hypertension 05/29/2006  . PULMONARY EMBOLISM, HX OF 05/29/2006   Ruben Im, PT 03/21/18 11:44 AM Phone: [phone] Fax: [phone]  Alvera Singh 03/21/2018, 11:44 AM  [facility] [address], [place], [place], [address] Phone: [phone]   Fax:  [phone]  Name: KATIELYNN HORAN MRN: [id number] Date of Birth:  03/18/1961   

## 2018-03-21 NOTE — Patient Instructions (Addendum)
  Access Code: [facility]  URL: [email or web address]  Date: 03/21/2018  Prepared by: Ruben Im   Exercises  Seated Hip Abduction - 10 reps - 1 sets - 1x daily - 7x weekly  Sit to Stand with Hands on Knees - 5 reps - 1 sets - 1x daily - 7x weekly  Standing Hip Abduction with Theraband Resistance - 10 reps - 1 sets - 1x daily - 7x weekly  Standing Hip Extension Kicks - 10 reps - 1 sets - 1x daily - 7x weekly       [facility] [address] [place], [place] [address] Phone # [phone] Fax [phone]

## 2018-03-28 ENCOUNTER — Ambulatory Visit: Payer: Medicaid Other | Admitting: Physical Therapy

## 2018-05-15 ENCOUNTER — Encounter: Payer: Self-pay | Admitting: Internal Medicine

## 2018-05-15 ENCOUNTER — Encounter (INDEPENDENT_AMBULATORY_CARE_PROVIDER_SITE_OTHER): Payer: Self-pay

## 2018-05-15 ENCOUNTER — Ambulatory Visit (INDEPENDENT_AMBULATORY_CARE_PROVIDER_SITE_OTHER): Payer: Medicaid Other | Admitting: Internal Medicine

## 2018-05-15 ENCOUNTER — Other Ambulatory Visit: Payer: Self-pay

## 2018-05-15 VITALS — BP 125/76 | HR 72 | Temp 98.2°F | Wt 287.5 lb

## 2018-05-15 DIAGNOSIS — Z79899 Other long term (current) drug therapy: Secondary | ICD-10-CM

## 2018-05-15 DIAGNOSIS — M797 Fibromyalgia: Secondary | ICD-10-CM | POA: Diagnosis present

## 2018-05-15 DIAGNOSIS — I10 Essential (primary) hypertension: Secondary | ICD-10-CM | POA: Diagnosis not present

## 2018-05-15 DIAGNOSIS — H5711 Ocular pain, right eye: Secondary | ICD-10-CM | POA: Diagnosis not present

## 2018-05-15 DIAGNOSIS — G629 Polyneuropathy, unspecified: Secondary | ICD-10-CM | POA: Diagnosis not present

## 2018-05-15 DIAGNOSIS — M2550 Pain in unspecified joint: Secondary | ICD-10-CM

## 2018-05-15 DIAGNOSIS — M1990 Unspecified osteoarthritis, unspecified site: Secondary | ICD-10-CM

## 2018-05-15 DIAGNOSIS — M7740 Metatarsalgia, unspecified foot: Secondary | ICD-10-CM | POA: Diagnosis not present

## 2018-05-15 DIAGNOSIS — Z6841 Body Mass Index (BMI) 40.0 and over, adult: Secondary | ICD-10-CM

## 2018-05-15 DIAGNOSIS — R195 Other fecal abnormalities: Secondary | ICD-10-CM | POA: Diagnosis not present

## 2018-05-15 DIAGNOSIS — F329 Major depressive disorder, single episode, unspecified: Secondary | ICD-10-CM

## 2018-05-15 DIAGNOSIS — F32A Depression, unspecified: Secondary | ICD-10-CM

## 2018-05-15 MED ORDER — VENLAFAXINE HCL ER 37.5 MG PO CP24: 37.5000 mg | ORAL_CAPSULE | Freq: Every day | ORAL | 2 refills | Status: DC

## 2018-05-15 NOTE — Progress Notes (Signed)
CC: polyarthralgia's and joint swelling  HPI:  [name] is a 57 y.o. female with HTN, neuropathy, osteoarthritis, metatarsalgia, ?fibromyalgia, morbid obesity and a questionable history of multiple DVT's, who presenting for polyarthralgia's and joint swelling.   Polyarthralgia and Joint Swelling [name] reports a several month history of polyarthralgia and intermittent joint swelling. She states that she has widespread persistent pain in her joints for several years and has recently (over the last several months) started having joint swelling. She states that the joint swelling mostly occurs at her wrists bilaterally, all joints of each hand, and ankles bilaterally. She had swelling yesterday in her hands and feet yesterday and reports that she had a lot of pain and tightness at the same time. Today she does not have any swelling. For her joint pain, she usually takes Gabapentin for the pain but does not think that it is working.   Depression [name] reports a several month history of feeling depressed. She states that she has been unable to work and has been stressed financially which has all contributed to her stress. She states that sometimes she "wonders why she is here" but denies SI and HI. She would like to speak with a psychotherapist stating that this would likely help her. She denies alcohol, tobacco, and drug use.   Eye Pain She reports several episode of sharp right eye pain that lasts for seconds over the past 2 weeks. She denies headache, vision changes, jaw claudication, fevers. She has never seen an ophthalmologist.  Pale Stool She reports 2 episodes of "white chalky stool 2 days ago." She has not had a stool since. She denies abdominal pain, nausea, vomiting. She does state that she is eating healthier and consuming more raw vegetables which may be contributing to her pale stool.   Past Medical History:  Diagnosis Date  . Anemia 2009    Baseline hemoglobin  at 11, secondary to iron deficiency  . Arthritis   . Breast mass 02/2008    Noted on mammogram March 11, 2008 3 cm simple cyst at 9 to 10:00 position of the right breast as well as multiple other smaller simple cyst, 2.6 cm mass at the 10:00 position of the right breast also representing complex cyst,  . Deep vein thrombosis (DVT) (HCC)     history of multiple  DVTs in 1989, 1989, 2004, on chronic anticoagulation with Coumadin  . Diverticulosis    noted colonoscopy 2010  . Eczema   . Fibroid uterus  2001    status post total abdominal hysterectomy, right and left salpingo-oophorectomy , done by Dr. [name]  . Heart murmur    as a child  . Hypertension   . PE (pulmonary embolism)     history of multiple PEs in 1984, 1989, 2004-  electronic data reviewed in [place] and EMR and I was not able to find single CT angiogram that was positive for pulmonary embolus nor was I able to find Doppler studies that were positive for DVTs  . 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  HENT: Negative for congestion and sore throat.   Eyes: Positive for pain.  Respiratory: Negative for cough and shortness of breath.  Cardiovascular: Positive for leg swelling. Negative for chest pain and palpitations.  Gastrointestinal: Negative for abdominal pain, diarrhea, nausea and vomiting.  Genitourinary: Negative for dysuria, frequency and urgency.  Neurological: Negative for dizziness and weakness.  Psychiatric/Behavioral: Positive for depression. Negative for substance abuse and suicidal ideas.  All other systems reviewed and are negative.   Physical Exam:  Vitals:   05/15/18 0902  BP: 125/76    Pulse: 72  Temp: 98.2 F (36.8 C)  TempSrc: Oral  SpO2: 100%  Weight: 287 lb 8 oz (130.4 kg)   Physical Exam  Constitutional: She appears well-developed and well-nourished.  HENT:  Head: Normocephalic and atraumatic.  Eyes: Pupils are equal, round, and reactive to light. EOM are normal.  Neck: Normal range of motion.  Cardiovascular: Normal rate and regular rhythm.  Pulmonary/Chest: Effort normal and breath sounds normal.  Abdominal: Soft. Bowel sounds are normal.  Musculoskeletal: She exhibits no edema.  No edema noted in the upper or lower extremities. Tenderness to palpation of all joints in both hands and at the ankles bilaterally.   Neurological: She is alert.  Skin: Skin is warm and dry.  Psychiatric: She has a normal mood and affect. Her behavior is normal.  Nursing note and vitals reviewed.   Assessment & Plan:   See Encounters Tab for problem based charting.  Patient seen with Dr. Daryll Drown

## 2018-05-16 ENCOUNTER — Telehealth: Payer: Self-pay | Admitting: Internal Medicine

## 2018-05-16 ENCOUNTER — Encounter: Payer: Medicaid Other | Admitting: Internal Medicine

## 2018-05-16 DIAGNOSIS — R195 Other fecal abnormalities: Secondary | ICD-10-CM | POA: Insufficient documentation

## 2018-05-16 DIAGNOSIS — F329 Major depressive disorder, single episode, unspecified: Secondary | ICD-10-CM | POA: Insufficient documentation

## 2018-05-16 DIAGNOSIS — F32A Depression, unspecified: Secondary | ICD-10-CM | POA: Insufficient documentation

## 2018-05-16 DIAGNOSIS — H5711 Ocular pain, right eye: Secondary | ICD-10-CM | POA: Insufficient documentation

## 2018-05-16 LAB — CMP14 + ANION GAP
ALT: 38 IU/L — ABNORMAL HIGH (ref 0–32)
AST: 24 IU/L (ref 0–40)
Albumin/Globulin Ratio: 1.5 (ref 1.2–2.2)
Albumin: 4.5 g/dL (ref 3.5–5.5)
Alkaline Phosphatase: 96 IU/L (ref 39–117)
Anion Gap: 15 mmol/L (ref 10.0–18.0)
BUN/Creatinine Ratio: 11 (ref 9–23)
BUN: 11 mg/dL (ref 6–24)
Bilirubin Total: 0.4 mg/dL (ref 0.0–1.2)
CO2: 22 mmol/L (ref 20–29)
Calcium: 9.8 mg/dL (ref 8.7–10.2)
Chloride: 104 mmol/L (ref 96–106)
Creatinine, Ser: 1.01 mg/dL — ABNORMAL HIGH (ref 0.57–1.00)
GFR calc Af Amer: 70 mL/min/{1.73_m2} (ref >59)
GFR calc non Af Amer: 61 mL/min/{1.73_m2} (ref >59)
Globulin, Total: 3.1 g/dL (ref 1.5–4.5)
Glucose: 89 mg/dL (ref 65–99)
Potassium: 4.5 mmol/L (ref 3.5–5.2)
Sodium: 141 mmol/L (ref 134–144)
Total Protein: 7.6 g/dL (ref 6.0–8.5)

## 2018-05-16 NOTE — Assessment & Plan Note (Signed)
Assessment: [name] had 2 recent episodes of pale stool of unclear etiology. Her CMP today was unremarkable decreasing the likelihood of liver or gallbladder disease causing the change in color. We will continue to monitor for now.  Plan: 1. Continue to monitor

## 2018-05-16 NOTE — Assessment & Plan Note (Signed)
Assessment: [name] polyarthralgia (multisite pain) does seem to be consistent with fibromyalgia vs osteoarthritis. She does not have any swelling today and I am unsure what to make of the intermittent swelling that she has been having. She has tried cymbalta in the past for suspected fibromyalgia and had severe nightmares.   Plan: 1. Start Venlafaxine 37.5 mg QD to treat both the fibromyalgia and depression.

## 2018-05-16 NOTE — Assessment & Plan Note (Signed)
Assessment: [name] has had several episodes of sharp right eye pain of unclear etiology. Today's eye exam was unremarkable. I do think that she needs an ophthalmology referral for screening of common diseases such as glaucoma and retinopathy but I do not think this is related to her current complaint.  Plan: 1. Refer to ophthalmology

## 2018-05-16 NOTE — Assessment & Plan Note (Signed)
Assessment: [name] presents with a several month history of depression. Her PHQ-9 was 16 today.  Plan: 1. Refer for psychotherapy 2. Prescribed venlafaxine 37.5 mg QD that will treat both the depression and fibromyalgia

## 2018-05-16 NOTE — Telephone Encounter (Signed)
CMP performed was unremarkable. I called Ms. Macht to inform her of these results. She expressed understanding.

## 2018-05-20 NOTE — Progress Notes (Signed)
Internal Medicine Clinic Attending  I saw and evaluated the patient.  I personally confirmed the key portions of the history and exam documented by Dr. Prince and I reviewed pertinent patient test results.  The assessment, diagnosis, and plan were formulated together and I agree with the documentation in the resident's note.  

## 2018-09-20 ENCOUNTER — Other Ambulatory Visit: Payer: Self-pay | Admitting: Internal Medicine

## 2019-02-20 IMAGING — CT CT ANGIO CHEST
2 of 6 series · 18 of 46 positions shown · IV contrast (ISOVUE 370)
Comparison: CTA chest 01/06/2014

CLINICAL DATA: Cough with chest pain

EXAM:
CT ANGIOGRAPHY CHEST WITH CONTRAST
TECHNIQUE: Multidetector CT imaging of the chest was performed using the
standard protocol during bolus administration of intravenous
contrast. Multiplanar CT image reconstructions and MIPs were
obtained to evaluate the vascular anatomy.
CONTRAST:  100mL [id number] IOPAMIDOL ([id number]) INJECTION 76%

[Series 6: thins · axial · 0.64mm/px · z∈[+1652,+1866]mm · 15 of 235 slices shown]
[im 11/235  lung]
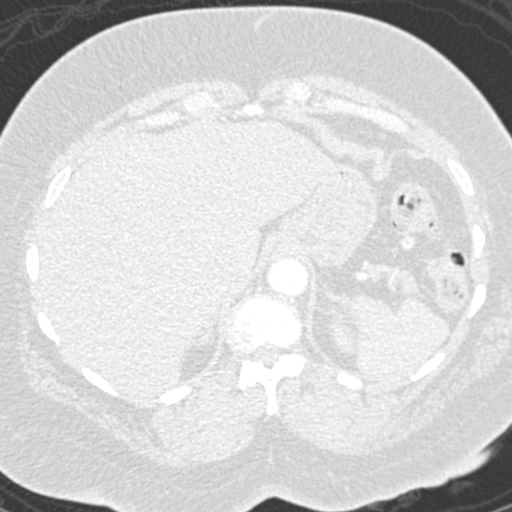
[im 31/235  soft-tissue]
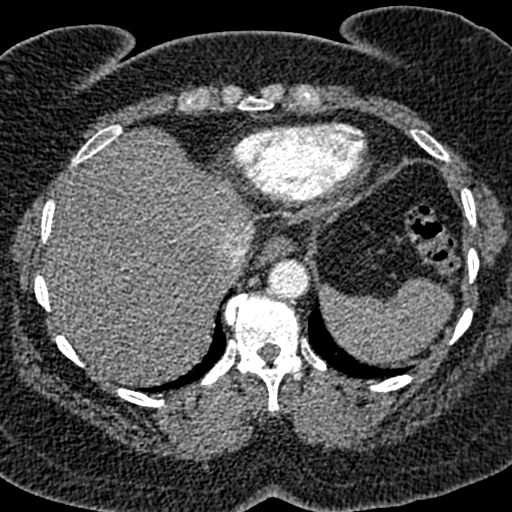
[im 41/235  lung]
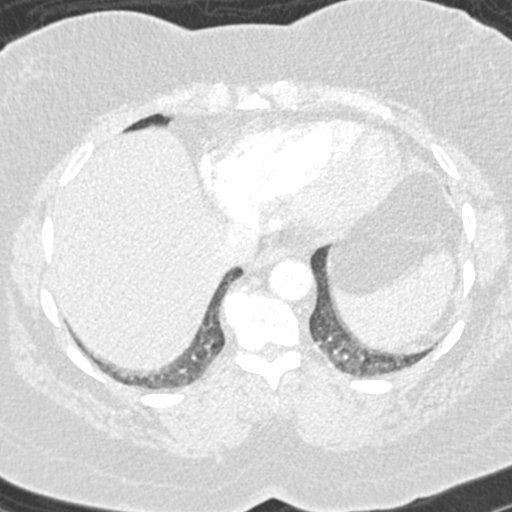
[im 62/235  soft-tissue]
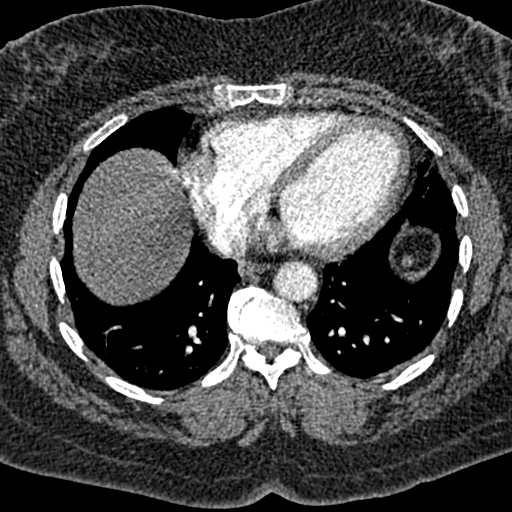
[im 72/235  lung]
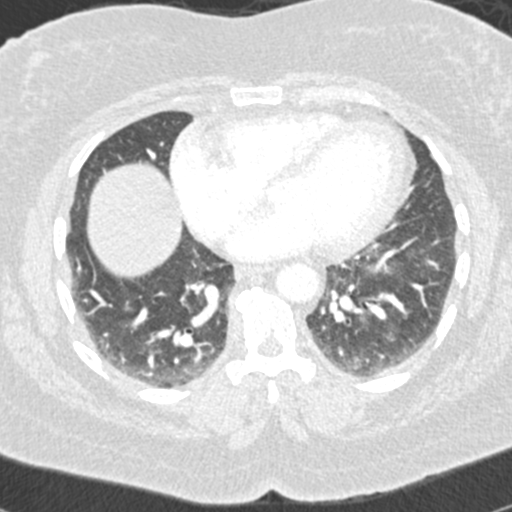
[im 92/235  soft-tissue]
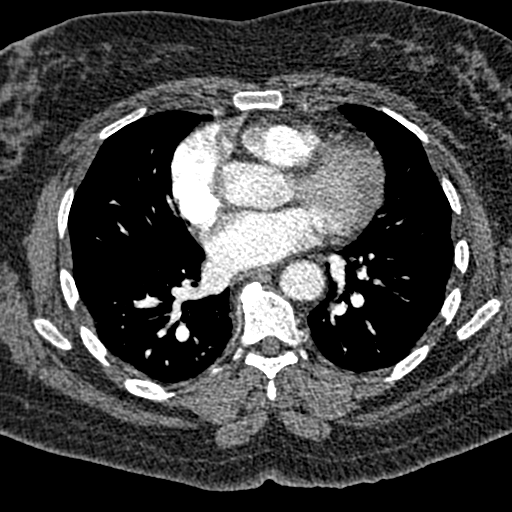
[im 102/235  lung]
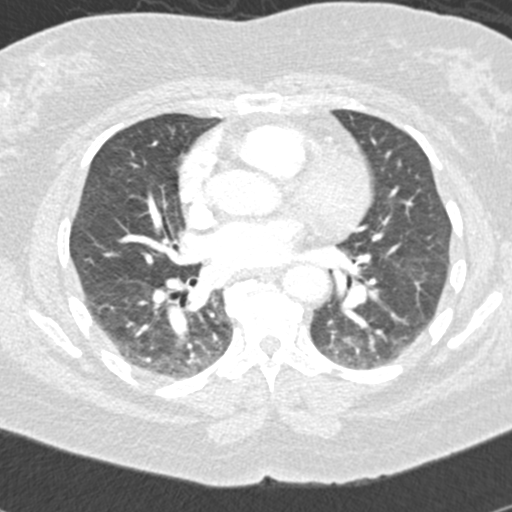
[im 123/235  soft-tissue]
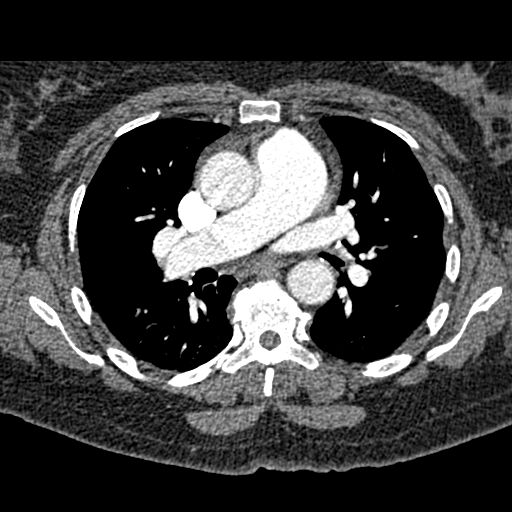
[im 133/235  lung]
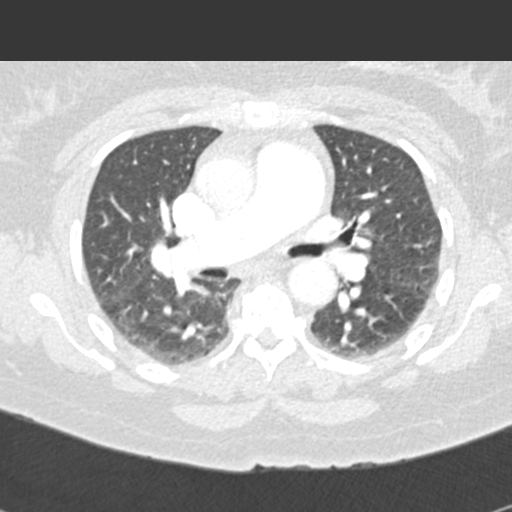
[im 143/235  soft-tissue]
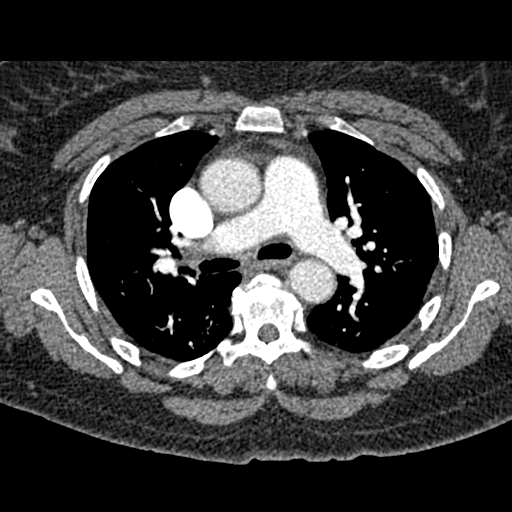
[im 163/235  lung]
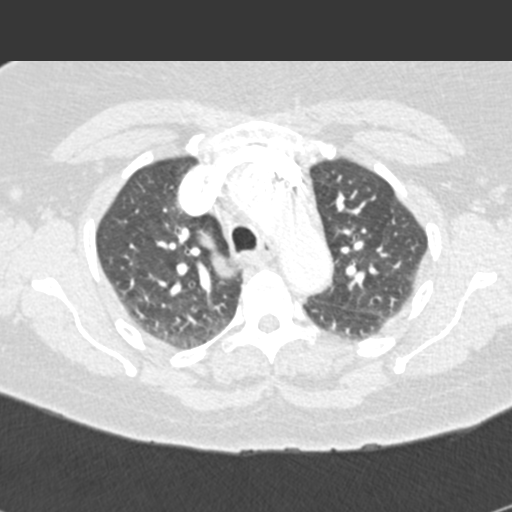
[im 173/235  soft-tissue]
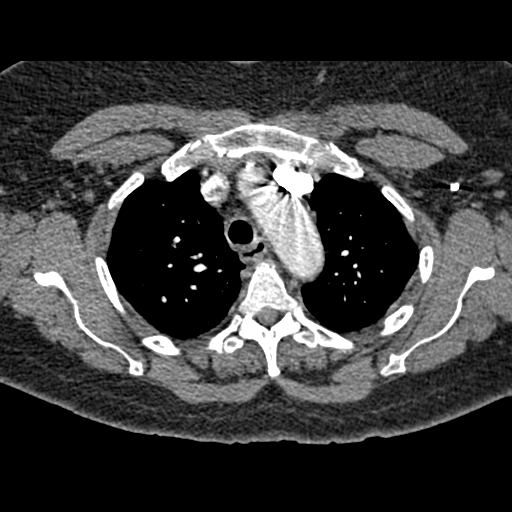
[im 194/235  lung]
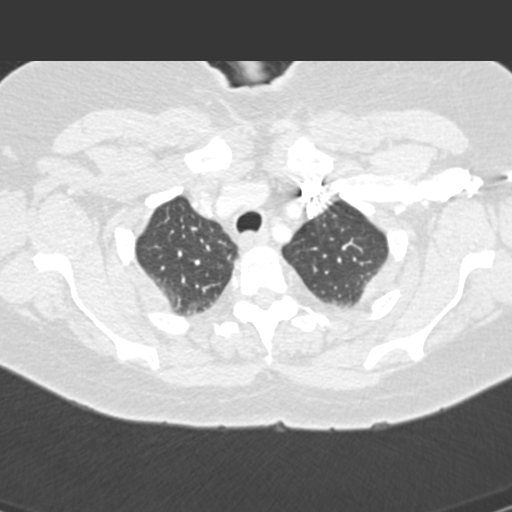
[im 204/235  soft-tissue]
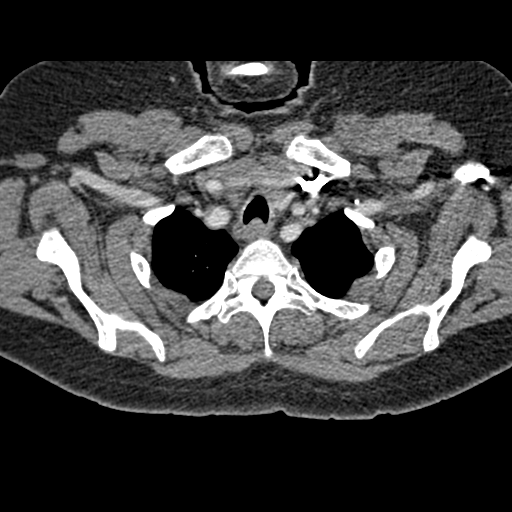
[im 224/235  lung]
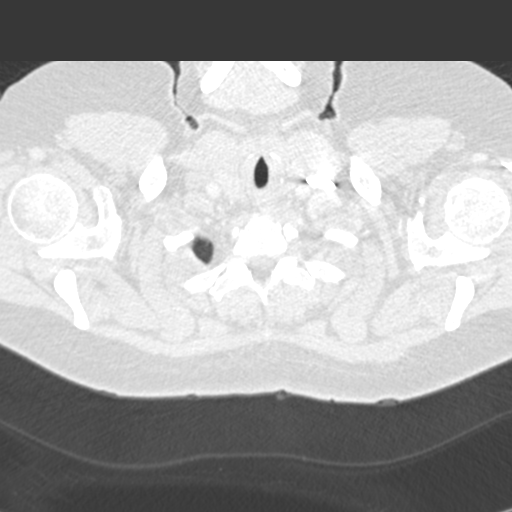

[Series 8: coronal mpr · coronal · 0.47mm/px · 3 of 142 slices shown]
[im 36/142  soft-tissue]
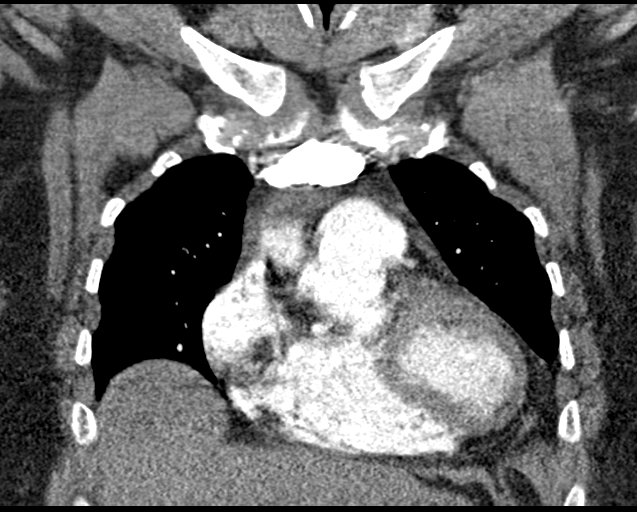
[im 71/142  soft-tissue]
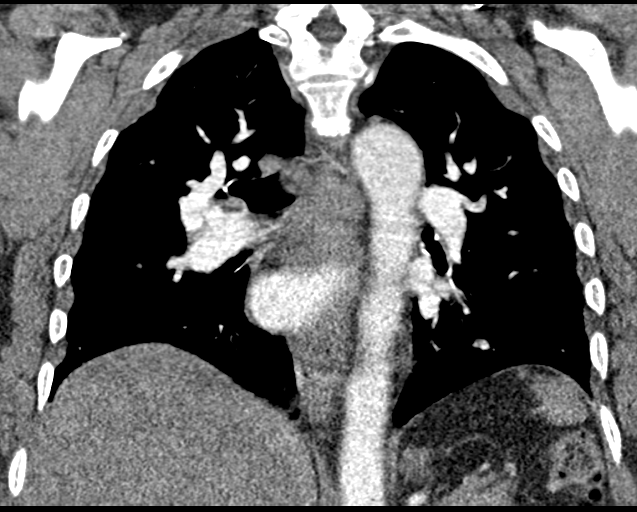
[im 106/142  soft-tissue]
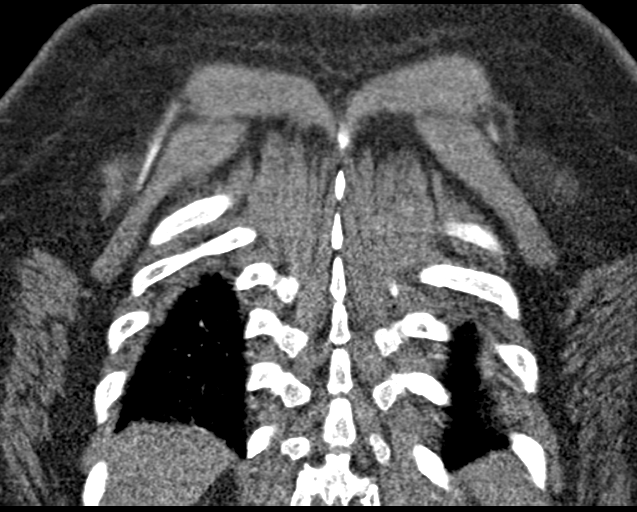

[18 of 46 positions shown; findings below may reference images not displayed]

FINDINGS: Cardiovascular:

--Pulmonary arteries: Contrast injection is sufficient to
demonstrate satisfactory opacification of the pulmonary arteries to
the segmental level. There is no pulmonary embolus. The main
pulmonary artery is mildly enlarged, measuring 3.4 cm.

--Aorta: Limited opacification of the aorta due to bolus timing
optimization for the pulmonary arteries. Conventional 3 vessel
aortic branching pattern. The aortic course and caliber are normal.
There is no aortic atherosclerosis.

--Heart: Mild cardiomegaly. No pericardial effusion.

Mediastinum/Nodes: No mediastinal, hilar or axillary
lymphadenopathy. The visualized thyroid and thoracic esophageal
course are unremarkable.

Lungs/Pleura: No pulmonary nodules or masses. No pleural effusion or
pneumothorax. No focal airspace consolidation. No focal pleural
abnormality.

Upper Abdomen: Contrast bolus timing is not optimized for evaluation
of the abdominal organs. Within this limitation, the visualized
organs of the upper abdomen are normal.

Musculoskeletal: No chest wall abnormality. No acute or significant
osseous findings.

Review of the MIP images confirms the above findings.
IMPRESSION: 1. No pulmonary embolus or other acute thoracic abnormality.
2. Mildly enlarged main pulmonary artery may indicate pulmonary
hypertension.
3. Cardiomegaly.

## 2019-04-17 ENCOUNTER — Other Ambulatory Visit: Payer: Self-pay | Admitting: Internal Medicine

## 2019-04-20 NOTE — Telephone Encounter (Signed)
Spoke with the pt.  She has sch an appt for 04/28/2019 with her PCP.

## 2019-04-24 NOTE — Progress Notes (Deleted)
   CC: ***  HPI:  [name] is a 58 y.o. female with essential htn, neuropathy, hyperlipidemia, chronic pain syndrome who presents for hypertension follow up. Please see problem based charting for evaluation, assessment, and plan.   htn  The patient's blood pressure during this visit was ***. The patient is currently taking amlodipine 10mg  qd. His last blood pressure visits are  BP Readings from Last 3 Encounters:  05/15/18 125/76  02/17/18 106/67  01/28/18 108/80    The patient does/does not *** report palpitations, dizziness, chest pain, sob ***.  Assessment and Plan ***   Chronic pain syndrome   Assessment and plan  -cbt  Past Medical History:  Diagnosis Date  . Anemia 2009    Baseline hemoglobin at 11, secondary to iron deficiency  . Arthritis   . Breast mass 02/2008    Noted on mammogram March 11, 2008 3 cm simple cyst at 9 to 10:00 position of the right breast as well as multiple other smaller simple cyst, 2.6 cm mass at the 10:00 position of the right breast also representing complex cyst,  . Deep vein thrombosis (DVT) (HCC)     history of multiple  DVTs in 1989, 1989, 2004, on chronic anticoagulation with Coumadin  . Diverticulosis    noted colonoscopy 2010  . Eczema   . Fibroid uterus  2001    status post total abdominal hysterectomy, right and left salpingo-oophorectomy , done by Dr. [name]  . Heart murmur    as a child  . Hypertension   . PE (pulmonary embolism)     history of multiple PEs in 1984, 1989, 2004-  electronic data reviewed in [place] and EMR and I was not able to find single CT angiogram that was positive for pulmonary embolus nor was I able to find Doppler studies that were positive for DVTs  . 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 ***  Assessment & Plan:   See Encounters Tab for problem based charting.  Patient {GC/GE:3044014::"discussed with","seen with"} Dr. {NAMES:[id number]::"Butcher","Gr[place]fortuna","E. Hoffman","Mullen","Narendra","Raines","Vincent"}

## 2019-04-28 ENCOUNTER — Encounter: Payer: Medicaid Other | Admitting: Internal Medicine

## 2019-05-03 NOTE — Progress Notes (Signed)
   CC: Hypertension follow up   HPI:  [name] is a 58 y.o. with essential htn, diverticulosis, chronic pain who presents for hypertension follow up. Please see problem based charting for evaluation, assessment, and plan.  Past Medical History:  Diagnosis Date  . Anemia 2009    Baseline hemoglobin at 11, secondary to iron deficiency  . Arthritis   . Breast mass 02/2008    Noted on mammogram March 11, 2008 3 cm simple cyst at 9 to 10:00 position of the right breast as well as multiple other smaller simple cyst, 2.6 cm mass at the 10:00 position of the right breast also representing complex cyst,  . Deep vein thrombosis (DVT) (HCC)     history of multiple  DVTs in 1989, 1989, 2004, on chronic anticoagulation with Coumadin  . Diverticulosis    noted colonoscopy 2010  . Eczema   . Fibroid uterus  2001    status post total abdominal hysterectomy, right and left salpingo-oophorectomy , done by Dr. [name]  . Heart murmur    as a child  . Hypertension   . PE (pulmonary embolism)     history of multiple PEs in 1984, 1989, 2004-  electronic data reviewed in [place] and EMR and I was not able to find single CT angiogram that was positive for pulmonary embolus nor was I able to find Doppler studies that were positive for DVTs  . 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  Respiratory: Negative for cough and shortness of breath.   Cardiovascular: Negative for chest pain.  Gastrointestinal: Negative for nausea and vomiting.  Neurological: Positive for dizziness and headaches.  Psychiatric/Behavioral: Positive for depression. The patient is nervous/anxious.    Physical  Exam:  Vitals:   05/05/19 1008  BP: 112/64  Pulse: 78  Temp: 98 F (36.7 C)  TempSrc: Oral  SpO2: 100%   Physical Exam  Constitutional: Appears well-developed and well-nourished. No distress.  HENT:  Head: Normocephalic and atraumatic.  Eyes: Conjunctivae are normal.  Cardiovascular: Normal rate, regular rhythm and normal heart sounds.  Respiratory: Effort normal and breath sounds normal. No respiratory distress. No wheezes.  GI: Soft. Bowel sounds are normal. No distension. There is no tenderness.  Musculoskeletal: No edema.  Neurological: Is alert.  Skin: Not diaphoretic. No erythema.  Psychiatric: Normal mood and affect. Behavior is normal. Judgment and thought content normal.    Assessment & Plan:   See Encounters Tab for problem based charting.  Patient discussed with Dr. Philipp Ovens

## 2019-05-05 ENCOUNTER — Ambulatory Visit (INDEPENDENT_AMBULATORY_CARE_PROVIDER_SITE_OTHER): Payer: Self-pay | Admitting: Internal Medicine

## 2019-05-05 ENCOUNTER — Other Ambulatory Visit: Payer: Self-pay

## 2019-05-05 VITALS — BP 112/64 | HR 78 | Temp 98.0°F

## 2019-05-05 DIAGNOSIS — G8929 Other chronic pain: Secondary | ICD-10-CM

## 2019-05-05 DIAGNOSIS — E611 Iron deficiency: Secondary | ICD-10-CM

## 2019-05-05 DIAGNOSIS — K5790 Diverticulosis of intestine, part unspecified, without perforation or abscess without bleeding: Secondary | ICD-10-CM

## 2019-05-05 DIAGNOSIS — Z79899 Other long term (current) drug therapy: Secondary | ICD-10-CM

## 2019-05-05 DIAGNOSIS — F32A Depression, unspecified: Secondary | ICD-10-CM

## 2019-05-05 DIAGNOSIS — G894 Chronic pain syndrome: Secondary | ICD-10-CM

## 2019-05-05 DIAGNOSIS — Z86711 Personal history of pulmonary embolism: Secondary | ICD-10-CM

## 2019-05-05 DIAGNOSIS — Z86718 Personal history of other venous thrombosis and embolism: Secondary | ICD-10-CM

## 2019-05-05 DIAGNOSIS — I10 Essential (primary) hypertension: Secondary | ICD-10-CM

## 2019-05-05 DIAGNOSIS — R42 Dizziness and giddiness: Secondary | ICD-10-CM

## 2019-05-05 DIAGNOSIS — R51 Headache: Secondary | ICD-10-CM

## 2019-05-05 DIAGNOSIS — F329 Major depressive disorder, single episode, unspecified: Secondary | ICD-10-CM

## 2019-05-05 DIAGNOSIS — F419 Anxiety disorder, unspecified: Secondary | ICD-10-CM

## 2019-05-05 MED ORDER — SERTRALINE HCL 25 MG PO TABS: 25.0000 mg | ORAL_TABLET | Freq: Every day | ORAL | 0 refills | Status: DC

## 2019-05-05 NOTE — Progress Notes (Signed)
Internal Medicine Clinic Attending  Case discussed with Dr. Chundi at the time of the visit.  We reviewed the resident's history and exam and pertinent patient test results.  I agree with the assessment, diagnosis, and plan of care documented in the resident's note. 

## 2019-05-05 NOTE — Assessment & Plan Note (Signed)
The patient has a strong history of multiple pulmonary embolisms. She was on chronic anticoagulation with coumadin, but she stopped taking it due to the close monitoring that it required. The patient mentions that she has not taken any anticoagulation since 2007. She states that her last pulmonary embolism was in 2004.   The patient has never been on any NOACs  Assessment and plan  Had at length discussion with the patient regarding her being at high risk for another clot and that she will benefit from being on chronic anticoagulation.   The patient refused to be started on anti-coagulation at this visit and said that she will discuss it at next visit.   -plan follow up visit in 4 weeks

## 2019-05-05 NOTE — Assessment & Plan Note (Signed)
  The patient states that she has been having depressive and anxiety symptoms recently. She scored 15 on her phq9.   The patient has been on duloxetine and cymbalta in the past. Stated that she has not been able to tolerate cymbalta in the past as it makes her have bad nightmares.   Assessment and plan  Will start patient on sertraline 25mg  qd  Referral made to [facility] Follow up in 4 weeks

## 2019-05-05 NOTE — Patient Instructions (Addendum)
It was a pleasure to see you today [name]. Please make the following changes:  For depression:  -please start sertraline 25mg  qd  -please follow with behavioral health  Counselors   For your blood clots -please think about the blood thinner (Xarelto) we spoke about and let me know if I can go ahead and prescribe it to you  If you have any questions or concerns, please call our clinic at [phone] between 9am-5pm and after hours call [phone] and ask for the internal medicine resident on call. If you feel you are having a medical emergency please call 911.   Thank you, we look forward to help you remain healthy!  Lars Mage, MD Internal Medicine PGY3

## 2019-05-05 NOTE — Assessment & Plan Note (Signed)
The patient's blood pressure during this visit was 112/64. The patient is currently taking amlodipine 10mg  qd. Her last blood pressure visits are   BP Readings from Last 3 Encounters:  05/05/19 112/64  05/15/18 125/76  02/17/18 106/67  The patient does not report palpitations, dizziness, chest pain, sob.  Assessment and Plan  -continue amlodipine 10mg  qd

## 2019-05-06 LAB — CBC
Hematocrit: 36 % (ref 34.0–46.6)
Hemoglobin: 12.1 g/dL (ref 11.1–15.9)
MCH: 28.8 pg (ref 26.6–33.0)
MCHC: 33.6 g/dL (ref 31.5–35.7)
MCV: 86 fL (ref 79–97)
Platelets: 328 x10E3/uL (ref 150–450)
RBC: 4.2 x10E6/uL (ref 3.77–5.28)
RDW: 13.6 % (ref 11.7–15.4)
WBC: 5.7 x10E3/uL (ref 3.4–10.8)

## 2019-05-06 LAB — IRON AND TIBC
Iron Saturation: 18 % (ref 15–55)
Iron: 46 ug/dL (ref 27–159)
Total Iron Binding Capacity: 257 ug/dL (ref 250–450)
UIBC: 211 ug/dL (ref 131–425)

## 2019-05-06 LAB — BMP8+ANION GAP
Anion Gap: 17 mmol/L (ref 10.0–18.0)
BUN/Creatinine Ratio: 9 — ABNORMAL LOW (ref 12–28)
BUN: 9 mg/dL (ref 8–27)
CO2: 19 mmol/L — ABNORMAL LOW (ref 20–29)
Calcium: 9.6 mg/dL (ref 8.7–10.3)
Chloride: 104 mmol/L (ref 96–106)
Creatinine, Ser: 0.99 mg/dL (ref 0.57–1.00)
GFR calc Af Amer: 72 mL/min/1.73
GFR calc non Af Amer: 62 mL/min/1.73
Glucose: 92 mg/dL (ref 65–99)
Potassium: 3.8 mmol/L (ref 3.5–5.2)
Sodium: 140 mmol/L (ref 134–144)

## 2019-05-06 LAB — FERRITIN: Ferritin: 149 ng/mL (ref 15–150)

## 2019-05-08 ENCOUNTER — Encounter: Payer: Self-pay | Admitting: Internal Medicine

## 2019-05-08 NOTE — Progress Notes (Signed)
Sent letter with normal results.

## 2019-05-19 ENCOUNTER — Encounter: Payer: Self-pay | Admitting: Licensed Clinical Social Worker

## 2019-05-19 ENCOUNTER — Telehealth: Payer: Self-pay | Admitting: Licensed Clinical Social Worker

## 2019-05-19 NOTE — Telephone Encounter (Signed)
Patient was contacted due to a referral from her doctor (1st attempt). Patient did not answer, and a vm was left for the patient.

## 2019-06-01 NOTE — Progress Notes (Deleted)
   CC: Hypertension follow up  HPI:  [name] is a 58 y.o. with essential htn, left ovarian cyst, mdd, hx of multiple pe, morbid obesity who presents for follow up of htn. Please see problem based charting for evaluation, assessment, and plan.   htn  The patient's blood pressure during this visit was ***. The patient is currently taking amlodipine 10mg  qd. His/her *** last blood pressure visits are  BP Readings from Last 3 Encounters:  05/05/19 112/64  05/15/18 125/76  02/17/18 106/67    The patient does/does not *** report palpitations, dizziness, chest pain, sob ***.  Assessment and Plan ***  MDD Patient's phq9 during this visit is ***. Patient is currently taking sertaline 25mg  qd.   PE Discussed noacs with patient   Past Medical History:  Diagnosis Date  . Anemia 2009    Baseline hemoglobin at 11, secondary to iron deficiency  . Arthritis   . Breast mass 02/2008    Noted on mammogram March 11, 2008 3 cm simple cyst at 9 to 10:00 position of the right breast as well as multiple other smaller simple cyst, 2.6 cm mass at the 10:00 position of the right breast also representing complex cyst,  . Deep vein thrombosis (DVT) (HCC)     history of multiple  DVTs in 1989, 1989, 2004, on chronic anticoagulation with Coumadin  . Diverticulosis    noted colonoscopy 2010  . Eczema   . Fibroid uterus  2001    status post total abdominal hysterectomy, right and left salpingo-oophorectomy , done by Dr. [name]  . Heart murmur    as a child  . Hypertension   . PE (pulmonary embolism)     history of multiple PEs in 1984, 1989, 2004-  electronic data reviewed in [place] and EMR and I was not able to find single CT angiogram that was positive for pulmonary embolus nor was I able to find Doppler studies that were positive for DVTs  . 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 ***  Assessment & Plan:   See Encounters Tab for problem based charting.  Patient {GC/GE:3044014::"discussed with","seen with"} Dr. {NAMES:[id number]::"Butcher","Guilloud","Hoffman","Mullen","Narendra","Raines","Vincent"}

## 2019-06-02 ENCOUNTER — Encounter: Payer: Self-pay | Admitting: Internal Medicine

## 2019-06-16 ENCOUNTER — Ambulatory Visit: Payer: Self-pay | Admitting: Licensed Clinical Social Worker

## 2019-06-23 ENCOUNTER — Other Ambulatory Visit: Payer: Self-pay | Admitting: Internal Medicine

## 2019-06-23 ENCOUNTER — Telehealth: Payer: Self-pay | Admitting: Licensed Clinical Social Worker

## 2019-06-23 ENCOUNTER — Encounter: Payer: Self-pay | Admitting: Licensed Clinical Social Worker

## 2019-06-23 NOTE — Telephone Encounter (Signed)
Patient was called to reschedule due to her appointment being cancelled. Patient did not answer, and there was no vm available. A letter will be mailed.

## 2019-08-04 ENCOUNTER — Ambulatory Visit: Payer: Self-pay | Admitting: Licensed Clinical Social Worker

## 2019-08-25 ENCOUNTER — Ambulatory Visit: Payer: Self-pay | Admitting: Licensed Clinical Social Worker

## 2019-09-30 ENCOUNTER — Other Ambulatory Visit: Payer: Self-pay | Admitting: Internal Medicine

## 2019-09-30 NOTE — Telephone Encounter (Signed)
Called patient no answer.  Will try again if no response will mail letter to the patient to contact [facility] for an appointment as soon as possible.

## 2019-09-30 NOTE — Telephone Encounter (Signed)
Can we please schedule a follow up appt for this pt with me

## 2019-10-06 ENCOUNTER — Encounter: Payer: Self-pay | Admitting: Internal Medicine

## 2019-10-06 NOTE — Telephone Encounter (Signed)
A letter has been mailed to the patient to contact [facility] to schedule an appointment.

## 2019-11-09 ENCOUNTER — Encounter (HOSPITAL_COMMUNITY): Payer: Self-pay | Admitting: Emergency Medicine

## 2019-11-09 ENCOUNTER — Emergency Department (HOSPITAL_COMMUNITY)
Admission: EM | Admit: 2019-11-09 | Discharge: 2019-11-09 | Disposition: A | Discharge status: Home / Self Care | Payer: HRSA Program | Attending: Emergency Medicine | Admitting: Emergency Medicine

## 2019-11-09 ENCOUNTER — Emergency Department (HOSPITAL_COMMUNITY): Payer: HRSA Program

## 2019-11-09 ENCOUNTER — Other Ambulatory Visit: Payer: Self-pay

## 2019-11-09 DIAGNOSIS — J111 Influenza due to unidentified influenza virus with other respiratory manifestations: Secondary | ICD-10-CM | POA: Insufficient documentation

## 2019-11-09 DIAGNOSIS — Z79899 Other long term (current) drug therapy: Secondary | ICD-10-CM | POA: Diagnosis not present

## 2019-11-09 DIAGNOSIS — Z20822 Contact with and (suspected) exposure to covid-19: Secondary | ICD-10-CM

## 2019-11-09 DIAGNOSIS — R05 Cough: Secondary | ICD-10-CM | POA: Insufficient documentation

## 2019-11-09 DIAGNOSIS — M7918 Myalgia, other site: Secondary | ICD-10-CM | POA: Diagnosis present

## 2019-11-09 DIAGNOSIS — U071 COVID-19: Secondary | ICD-10-CM | POA: Diagnosis not present

## 2019-11-09 DIAGNOSIS — I10 Essential (primary) hypertension: Secondary | ICD-10-CM | POA: Insufficient documentation

## 2019-11-09 LAB — INFLUENZA PANEL BY PCR (TYPE A & B)
Influenza A By PCR: NEGATIVE
Influenza B By PCR: NEGATIVE

## 2019-11-09 NOTE — ED Provider Notes (Signed)
[name] DEPT Provider Note   CSN: [id number] Arrival date & time: 11/09/19  1331     History Chief Complaint  Patient presents with  . COVID Exposure  . Generalized Body Aches    [name] is a 59 y.o. female with a past medical history of prior DVT and PE, anemia presenting to the ED for possible Covid exposure.  States that she received the first dose of her Pfizer Covid vaccine on 10/31/2019.  She saw someone 3 days ago and was concerned that they had Covid but she does not know if they were positive.  She began feeling ill yesterday with generalized body aches and a productive cough.  She denies any documented fevers, chest pain, abdominal pain, diarrhea or vomiting.  She took a dose of Mucinex with minimal improvement.  She denies any chronic lung disease or recent travel.  HPI     Past Medical History:  Diagnosis Date  . Anemia 2009    Baseline hemoglobin at 11, secondary to iron deficiency  . Arthritis   . Breast mass 02/2008    Noted on mammogram March 11, 2008 3 cm simple cyst at 9 to 10:00 position of the right breast as well as multiple other smaller simple cyst, 2.6 cm mass at the 10:00 position of the right breast also representing complex cyst,  . Deep vein thrombosis (DVT) (HCC)     history of multiple  DVTs in 1989, 1989, 2004, on chronic anticoagulation with Coumadin  . Diverticulosis    noted colonoscopy 2010  . Eczema   . Fibroid uterus  2001    status post total abdominal hysterectomy, right and left salpingo-oophorectomy , done by Dr. [name]  . Heart murmur    as a child  . Hypertension   . PE (pulmonary embolism)     history of multiple PEs in 1984, 1989, 2004-  electronic data reviewed in [place] and EMR and I was not able to find single CT angiogram that was positive for pulmonary embolus nor was I able to find Doppler studies that were positive for DVTs  . 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 Eye pain, right 05/16/2018  . Depression 05/16/2018  . Pale stool 05/16/2018  . Neuropathy 01/28/2018  . 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 Dysphonia 12/19/2017  . Left hip pain 09/06/2017  . Obesity, morbid ([place]) 09/28/2016  . Chronic pain syndrome 05/18/2016  . Hyperlipidemia 01/27/2014  . Headache(784.0) 09/29/2013  . Bilateral knee pain 05/25/2013  . Metatarsalgia of both feet 05/25/2013  . Eczema 05/12/2013  . Insomnia 05/12/2013  . Diverticulosis 04/18/2012  . Ovarian cyst, left 04/16/2012  . Breast cyst 10/02/2011  . Arthritis 02/07/2011  . Arthralgia 04/05/2009  . Essential hypertension 05/29/2006  . 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 ABDOMINAL HYSTERECTOMY  04/13/2010   done by Dr. [name]  . BREAST LUMPECTOMY WITH RADIOACTIVE SEED LOCALIZATION Right 07/09/2017   Procedure: RIGHT BREAST LUMPECTOMY WITH RADIOACTIVE SEED LOCALIZATION;  Surgeon: Erroll Luna, MD;  Location: [facility];  Service: General;  Laterality: Right;  . CHOLECYSTECTOMY     1997  . MASTECTOMY, PARTIAL  03/2006    left partial mastectomy secondary to fibrocystic disease intraluminal fibroadenoma performed March 28, 2006 done by Dr. [name]     OB History   No obstetric history on file.     Family History  Problem Relation Age of Onset  . Cancer Other   . Clotting disorder Other   . Gout Father   . Stroke Neg Hx     Social History   Tobacco Use  . Smoking status: Never Smoker  . Smokeless tobacco: Never Used  Substance Use Topics  . Alcohol use: No    Alcohol/week: 0.0 standard drinks    Comment: rare  . Drug use: No    Home  Medications Prior to Admission medications   Medication Sig Start Date End Date Taking? Authorizing Provider  albuterol (PROVENTIL HFA;VENTOLIN HFA) 108 (90 Base) MCG/ACT inhaler Inhale 1-2 puffs into the lungs every 6 (six) hours as needed for wheezing or shortness of breath. 02/17/18   Varney Biles, MD  amLODipine (NORVASC) 10 MG tablet TAKE 1 TABLET BY MOUTH EVERY DAY 10/02/19   Lars Mage, MD  Multiple Vitamin (MULTIVITAMIN) capsule Take 1 capsule daily by mouth.    [provider]  POTASSIUM GLUCONATE PO Take 99 mg every other day by mouth.    [provider]  sertraline (ZOLOFT) 25 MG tablet Take 1 tablet (25 mg total) by mouth daily. 05/05/19 06/04/19  Lars Mage, MD    Allergies    Dilaudid [hydromorphone hcl]  Review of Systems   Review of Systems  Constitutional: Negative for appetite change, chills and fever.  HENT: Negative for ear pain, rhinorrhea, sneezing and sore throat.   Eyes: Negative for photophobia and visual disturbance.  Respiratory: Positive for cough. Negative for chest tightness, shortness of breath and wheezing.   Cardiovascular: Negative for chest pain and palpitations.  Gastrointestinal: Negative for abdominal pain, blood in stool, constipation, diarrhea, nausea and vomiting.  Genitourinary: Negative for dysuria, hematuria and urgency.  Musculoskeletal: Positive for myalgias. Negative for joint swelling.  Skin: Negative for rash.  Neurological: Negative for dizziness, weakness and light-headedness.    Physical Exam Updated Vital Signs BP (!) 135/95 (BP Location: Right Arm)   Pulse 87   Temp 99 F (37.2 C) (Oral)   Resp 16   Ht 5\' 11"  (1.803 m)   Wt 122.5 kg   SpO2 98%   BMI 37.66 kg/m   Physical Exam Vitals and nursing note reviewed.  Constitutional:      General: She is not in acute distress.    Appearance: She is well-developed.     Comments: Speaking in complete sentences without difficulty.  HENT:     Head:  Normocephalic and atraumatic.     Nose: Nose normal.  Eyes:     General: No scleral icterus.       Left eye: No discharge.     Conjunctiva/sclera: Conjunctivae normal.  Cardiovascular:     Rate and Rhythm: Normal rate and regular rhythm.     Heart sounds: Normal heart sounds. No murmur. No friction rub. No gallop.   Pulmonary:     Effort: Pulmonary effort is normal. No respiratory distress.     Breath sounds: Normal breath sounds.  Abdominal:     General: Bowel sounds are normal. There is no distension.     Palpations: Abdomen is soft.     Tenderness: There is no abdominal tenderness. There is no guarding.  Musculoskeletal:        General: Normal  range of motion.     Cervical back: Normal range of motion and neck supple.  Skin:    General: Skin is warm and dry.     Findings: No rash.  Neurological:     Mental Status: She is alert.     Motor: No abnormal muscle tone.     Coordination: Coordination normal.     ED Results / Procedures / Treatments   Labs (all labs ordered are listed, but only abnormal results are displayed) Labs Reviewed  SARS CORONAVIRUS 2 (TAT 6-24 HRS)  INFLUENZA PANEL BY PCR (TYPE A & B)    EKG None  Radiology DG Chest 2 View  Result Date: 11/09/2019 CLINICAL DATA:  Nonproductive cough for 1 day. EXAM: CHEST - 2 VIEW COMPARISON:  Radiograph and CT 02/17/2018 FINDINGS: The cardiomediastinal contours are normal. The lungs are clear. Pulmonary vasculature is normal. No consolidation, pleural effusion, or pneumothorax. No acute osseous abnormalities are seen. Surgical clips project over the right breast. IMPRESSION: No acute chest findings. Electronically Signed   By: Keith Rake M.D.   On: 11/09/2019 14:30    Procedures Procedures (including critical care time)  Medications Ordered in ED Medications - No data to display  ED Course  I have reviewed the triage vital signs and the nursing notes.  Pertinent labs & imaging results that were  available during my care of the patient were reviewed by me and considered in my medical decision making (see chart for details).    MDM Rules/Calculators/A&P                      MYKALA FRIEDEN was evaluated in Emergency Department on 11/09/19 for the symptoms described in the history of present illness. He/she was evaluated in the context of the global COVID-19 pandemic, which necessitated consideration that the patient might be at risk for infection with the SARS-CoV-2 virus that causes COVID-19. Institutional protocols and algorithms that pertain to the evaluation of patients at risk for COVID-19 are in a state of rapid change based on information released by regulatory bodies including the CDC and federal and state organizations. These policies and algorithms were followed during the patient's care in the ED.  59 year old female presents to ED with possible Covid exposure 3 days ago.  Began feeling ill with body aches and cough yesterday.  Denies chest pain, abdominal pain, vomiting or diarrhea.  On exam lungs are clear to auscultation bilaterally.  She is speaking in complete sentences without difficulty.  She is not tachycardic, tachypneic or hypoxic on my exam.  She does have a dry cough.  X-ray without any concerning findings.  Suspect Covid or other viral respiratory illness as the cause of her symptoms.  Will obtain Covid swab and she is requesting a flu test as well.  We will have patient follow-up with the results of her lab work when it is available. She is agreeable to symptoms management at home and returning for worsening symptoms.  Patient is hemodynamically stable, in NAD. Evaluation does not show pathology that would require ongoing emergent intervention or inpatient treatment. I have personally reviewed and interpreted all lab work and imaging at today's ED visit. I explained the diagnosis to the patient. Pain has been managed and has no complaints prior to discharge. Patient is  comfortable with above plan and is stable for discharge at this time. All questions were answered prior to disposition. Strict return precautions for returning to the ED were discussed. Encouraged  follow up with PCP.   An After Visit Summary was printed and given to the patient.   Portions of this note were generated with [employer]. Dictation errors may occur despite best attempts at proofreading.  Final Clinical Impression(s) / ED Diagnoses Final diagnoses:  Influenza-like illness  Suspected COVID-19 virus infection    Rx / DC Orders ED Discharge Orders    None       Delia Heady, PA-C 11/09/19 1508    Tegeler, Gwenyth Allegra, MD 11/09/19 2031

## 2019-11-09 NOTE — ED Triage Notes (Signed)
Patient here from home reporting COVID exposure and "feeling sick" since Saturday. Cough, generalized body aches.

## 2019-11-09 NOTE — Discharge Instructions (Addendum)
Take Tylenol as needed for pain. Make sure you are drinking plenty of fluids. We will contact you with the results of your lab work when it is available. Return to the ER if you start to have chest pain, shortness of breath, inability to eat or drink anything, numbness in arms or legs.

## 2019-11-10 LAB — SARS CORONAVIRUS 2 (TAT 6-24 HRS): SARS Coronavirus 2: POSITIVE — AB

## 2019-11-11 ENCOUNTER — Other Ambulatory Visit: Payer: Self-pay | Admitting: Unknown Physician Specialty

## 2019-11-11 ENCOUNTER — Telehealth: Payer: Self-pay | Admitting: Unknown Physician Specialty

## 2019-11-11 DIAGNOSIS — U071 COVID-19: Secondary | ICD-10-CM

## 2019-11-11 DIAGNOSIS — I10 Essential (primary) hypertension: Secondary | ICD-10-CM

## 2019-11-11 MED ORDER — SODIUM CHLORIDE 0.9 % IV SOLN
700.0000 mg | Freq: Once | INTRAVENOUS | Status: AC
  Administered 2019-11-12: 700 mg via INTRAVENOUS
  Filled 2019-11-11 (×2): qty 20

## 2019-11-11 NOTE — Telephone Encounter (Signed)
  I connected by phone with [name] on 11/11/2019 at 9:18 AM to discuss the potential use of an new treatment for mild to moderate COVID-19 viral infection in non-hospitalized patients.  This patient is a 59 y.o. female that meets the FDA criteria for Emergency Use Authorization of bamlanivimab or casirivimab\imdevimab.  Has a (+) direct SARS-CoV-2 viral test result  Has mild or moderate COVID-19   Is ? 61 years of age and weighs ? 40 kg  Is NOT hospitalized due to COVID-19  Is NOT requiring oxygen therapy or requiring an increase in baseline oxygen flow rate due to COVID-19  Is within 10 days of symptom onset  Has at least one of the high risk factor(s) for progression to severe COVID-19 and/or hospitalization as defined in EUA.  Specific high risk criteria : BMI >/= 35 plus hypertension   I have spoken and communicated the following to the patient or parent/caregiver:  1. FDA has authorized the emergency use of bamlanivimab and casirivimab\imdevimab for the treatment of mild to moderate COVID-19 in adults and pediatric patients with positive results of direct SARS-CoV-2 viral testing who are 54 years of age and older weighing at least 40 kg, and who are at high risk for progressing to severe COVID-19 and/or hospitalization.  2. The significant known and potential risks and benefits of bamlanivimab and casirivimab\imdevimab, and the extent to which such potential risks and benefits are unknown.  3. Information on available alternative treatments and the risks and benefits of those alternatives, including clinical trials.  4. Patients treated with bamlanivimab and casirivimab\imdevimab should continue to self-isolate and use infection control measures (e.g., wear mask, isolate, social distance, avoid sharing personal items, clean and disinfect "high touch" surfaces, and frequent handwashing) according to CDC guidelines.   5. The patient or parent/caregiver has the option to  accept or refuse bamlanivimab or casirivimab\imdevimab .  Pt told that we don't have clinical trial information of the reaction of patients who have had 1 vaccine.    After reviewing this information with the patient, The patient agreed to proceed with receiving the bamlanimivab infusion and will be provided a copy of the Fact sheet prior to receiving the infusion.[name] 11/11/2019 9:18 AM  Sx onset 3/21

## 2019-11-12 ENCOUNTER — Ambulatory Visit (HOSPITAL_COMMUNITY)
Admission: RE | Admit: 2019-11-12 | Discharge: 2019-11-12 | Disposition: A | Discharge status: Home / Self Care | Payer: HRSA Program | Source: Ambulatory Visit | Attending: Pulmonary Disease | Admitting: Pulmonary Disease

## 2019-11-12 DIAGNOSIS — U071 COVID-19: Secondary | ICD-10-CM | POA: Insufficient documentation

## 2019-11-12 DIAGNOSIS — I10 Essential (primary) hypertension: Secondary | ICD-10-CM | POA: Insufficient documentation

## 2019-11-12 MED ORDER — DIPHENHYDRAMINE HCL 50 MG/ML IJ SOLN: 50.0000 mg | Freq: Once | INTRAMUSCULAR | Status: DC | PRN

## 2019-11-12 MED ORDER — EPINEPHRINE 0.3 MG/0.3ML IJ SOAJ: 0.3000 mg | Freq: Once | INTRAMUSCULAR | Status: DC | PRN

## 2019-11-12 MED ORDER — FAMOTIDINE IN NACL 20-0.9 MG/50ML-% IV SOLN: 20.0000 mg | Freq: Once | INTRAVENOUS | Status: DC | PRN

## 2019-11-12 MED ORDER — SODIUM CHLORIDE 0.9 % IV SOLN
INTRAVENOUS | Status: DC | PRN
  Administered 2019-11-12: 250 mL via INTRAVENOUS

## 2019-11-12 MED ORDER — METHYLPREDNISOLONE SODIUM SUCC 125 MG IJ SOLR: 125.0000 mg | Freq: Once | INTRAMUSCULAR | Status: DC | PRN

## 2019-11-12 MED ORDER — ALBUTEROL SULFATE HFA 108 (90 BASE) MCG/ACT IN AERS: 2.0000 | INHALATION_SPRAY | Freq: Once | RESPIRATORY_TRACT | Status: DC | PRN

## 2019-11-12 NOTE — Discharge Instructions (Signed)

## 2019-11-12 NOTE — Progress Notes (Signed)
  Diagnosis: COVID-19  Physician:dr wright  Procedure: Covid Infusion Clinic Med: bamlanivimab infusion - Provided patient with bamlanimivab fact sheet for patients, parents and caregivers prior to infusion.  Complications: No immediate complications noted.  Discharge: Discharged home   [place] 11/12/2019

## 2019-11-13 ENCOUNTER — Ambulatory Visit (HOSPITAL_COMMUNITY): Payer: Self-pay

## 2019-11-13 ENCOUNTER — Other Ambulatory Visit: Payer: Self-pay | Admitting: Internal Medicine

## 2019-11-13 MED ORDER — ALBUTEROL SULFATE HFA 108 (90 BASE) MCG/ACT IN AERS: 1.0000 | INHALATION_SPRAY | Freq: Four times a day (QID) | RESPIRATORY_TRACT | 0 refills | Status: DC | PRN

## 2019-11-13 NOTE — Telephone Encounter (Signed)
Need refill on albuterol (PROVENTIL HFA;VENTOLIN HFA) 108 (90 Base) MCG/ACT inhaleralbuterol (PROVENTIL HFA;VENTOLIN HFA) 108 (90 Base) MCG/ACT inhaler  ;pt contact [phone]  [id number] - [place], [place] - [address]

## 2019-11-16 ENCOUNTER — Other Ambulatory Visit: Payer: Self-pay

## 2019-11-16 NOTE — Telephone Encounter (Signed)
albuterol (VENTOLIN HFA) 108 (90 Base) MCG/ACT inhaler, refill request @  [id number] [place], [place] (Phone) [phone] (Fax)

## 2019-11-17 ENCOUNTER — Other Ambulatory Visit: Payer: Self-pay | Admitting: Internal Medicine

## 2019-11-17 MED ORDER — ALBUTEROL SULFATE HFA 108 (90 BASE) MCG/ACT IN AERS: 1.0000 | INHALATION_SPRAY | Freq: Four times a day (QID) | RESPIRATORY_TRACT | 1 refills | Status: DC | PRN

## 2019-11-24 ENCOUNTER — Emergency Department (HOSPITAL_COMMUNITY)
Admission: EM | Admit: 2019-11-24 | Discharge: 2019-11-24 | Disposition: A | Discharge status: Home / Self Care | Payer: HRSA Program | Attending: Emergency Medicine | Admitting: Emergency Medicine

## 2019-11-24 ENCOUNTER — Encounter (HOSPITAL_COMMUNITY): Payer: Self-pay

## 2019-11-24 ENCOUNTER — Other Ambulatory Visit: Payer: Self-pay

## 2019-11-24 ENCOUNTER — Emergency Department (HOSPITAL_COMMUNITY): Payer: HRSA Program

## 2019-11-24 DIAGNOSIS — I10 Essential (primary) hypertension: Secondary | ICD-10-CM | POA: Diagnosis not present

## 2019-11-24 DIAGNOSIS — U071 COVID-19: Secondary | ICD-10-CM | POA: Insufficient documentation

## 2019-11-24 DIAGNOSIS — Z79899 Other long term (current) drug therapy: Secondary | ICD-10-CM | POA: Diagnosis not present

## 2019-11-24 DIAGNOSIS — R0602 Shortness of breath: Secondary | ICD-10-CM | POA: Diagnosis present

## 2019-11-24 LAB — COMPREHENSIVE METABOLIC PANEL
ALT: 28 U/L (ref 0–44)
AST: 21 U/L (ref 15–41)
Albumin: 4.1 g/dL (ref 3.5–5.0)
Alkaline Phosphatase: 80 U/L (ref 38–126)
Anion gap: 8 (ref 5–15)
BUN: 15 mg/dL (ref 6–20)
CO2: 26 mmol/L (ref 22–32)
Calcium: 9.4 mg/dL (ref 8.9–10.3)
Chloride: 107 mmol/L (ref 98–111)
Creatinine, Ser: 1.03 mg/dL — ABNORMAL HIGH (ref 0.44–1.00)
GFR calc Af Amer: >60 mL/min (ref >60)
GFR calc non Af Amer: 59 mL/min — ABNORMAL LOW (ref >60)
Glucose, Bld: 105 mg/dL — ABNORMAL HIGH (ref 70–99)
Potassium: 4 mmol/L (ref 3.5–5.1)
Sodium: 141 mmol/L (ref 135–145)
Total Bilirubin: 0.4 mg/dL (ref 0.3–1.2)
Total Protein: 7.5 g/dL (ref 6.5–8.1)

## 2019-11-24 LAB — CBC WITH DIFFERENTIAL/PLATELET
Abs Immature Granulocytes: 0.03 10*3/uL (ref 0.00–0.07)
Basophils Absolute: 0 10*3/uL (ref 0.0–0.1)
Basophils Relative: 0 %
Eosinophils Absolute: 0.2 10*3/uL (ref 0.0–0.5)
Eosinophils Relative: 2 %
HCT: 35.7 % — ABNORMAL LOW (ref 36.0–46.0)
Hemoglobin: 12.1 g/dL (ref 12.0–15.0)
Immature Granulocytes: 0 %
Lymphocytes Relative: 47 %
Lymphs Abs: 3.4 10*3/uL (ref 0.7–4.0)
MCH: 30.3 pg (ref 26.0–34.0)
MCHC: 33.9 g/dL (ref 30.0–36.0)
MCV: 89.5 fL (ref 80.0–100.0)
Monocytes Absolute: 0.7 10*3/uL (ref 0.1–1.0)
Monocytes Relative: 9 %
Neutro Abs: 3.1 10*3/uL (ref 1.7–7.7)
Neutrophils Relative %: 42 %
Platelets: 332 10*3/uL (ref 150–400)
RBC: 3.99 MIL/uL (ref 3.87–5.11)
RDW: 13.4 % (ref 11.5–15.5)
WBC: 7.3 10*3/uL (ref 4.0–10.5)
nRBC: 0 % (ref 0.0–0.2)

## 2019-11-24 LAB — BRAIN NATRIURETIC PEPTIDE: B Natriuretic Peptide: 42.5 pg/mL (ref 0.0–100.0)

## 2019-11-24 LAB — D-DIMER, QUANTITATIVE: D-Dimer, Quant: 0.37 ug/mL-FEU (ref 0.00–0.50)

## 2019-11-24 LAB — TROPONIN I (HIGH SENSITIVITY): Troponin I (High Sensitivity): <2 ng/L (ref <18)

## 2019-11-24 MED ORDER — AEROCHAMBER PLUS FLO-VU SMALL MISC: 1.0000 | Freq: Once | Status: DC

## 2019-11-24 MED ORDER — AEROCHAMBER Z-STAT PLUS/MEDIUM MISC
1.0000 | Freq: Once | Status: AC
  Administered 2019-11-24: 1
  Filled 2019-11-24: qty 1

## 2019-11-24 MED ORDER — HYDROCOD POLST-CPM POLST ER 10-8 MG/5ML PO SUER: 5.0000 mL | Freq: Two times a day (BID) | ORAL | 0 refills | Status: DC

## 2019-11-24 MED ORDER — BENZONATATE 100 MG PO CAPS
100.0000 mg | ORAL_CAPSULE | Freq: Once | ORAL | Status: AC
  Administered 2019-11-24: 100 mg via ORAL
  Filled 2019-11-24: qty 1

## 2019-11-24 MED ORDER — ALBUTEROL SULFATE HFA 108 (90 BASE) MCG/ACT IN AERS
4.0000 | INHALATION_SPRAY | Freq: Once | RESPIRATORY_TRACT | Status: AC
  Administered 2019-11-24: 4 via RESPIRATORY_TRACT
  Filled 2019-11-24: qty 6.7

## 2019-11-24 NOTE — Discharge Instructions (Signed)
Take 2 puffs of the albuterol inhaler every 4-6 hours as needed for shortness of breath.  Prescription given for cough medication (Tussionex). Take medication as directed and do not operate machinery, drive a car, or work while taking this medication as it can make you drowsy.   Please follow up with your primary care provider within 5-7 days for re-evaluation of your symptoms. If you do not have a primary care provider, information for a healthcare clinic has been provided for you to make arrangements for follow up care. Please return to the emergency department for any new or worsening symptoms.

## 2019-11-24 NOTE — ED Provider Notes (Signed)
[name]   CSN: [id number] Arrival date & time: 11/24/19  1832     History Chief Complaint  Patient presents with  . Shortness of Breath    [name] is a 59 y.o. female.  HPI   Pt is a 59 y/o F with a h/o anemia, breast mass, DVT/PE, fibroid uterus, eczema, who presents to the ED today for eval of a cough and SOB. She was diagnosed with COVID on 11/09/2019. She became symptomatic on 3/20. For the last two days her cough has been more severe and she feels more SOB. She has had some sputum production. Denies hemoptysis. She has used albuterol without relief. Denies any chest pain or pain with inspiration.   Denies BLE swelling. Denies fevers.  States she has a h/o DVT/PE's but she is not currently anticoagulated.  Past Medical History:  Diagnosis Date  . Anemia 2009    Baseline hemoglobin at 11, secondary to iron deficiency  . Arthritis   . Breast mass 02/2008    Noted on mammogram March 11, 2008 3 cm simple cyst at 9 to 10:00 position of the right breast as well as multiple other smaller simple cyst, 2.6 cm mass at the 10:00 position of the right breast also representing complex cyst,  . Deep vein thrombosis (DVT) (HCC)     history of multiple  DVTs in 1989, 1989, 2004, on chronic anticoagulation with Coumadin  . Diverticulosis    noted colonoscopy 2010  . Eczema   . Fibroid uterus  2001    status post total abdominal hysterectomy, right and left salpingo-oophorectomy , done by Dr. [name]  . Heart murmur    as a child  . Hypertension   . PE (pulmonary embolism)     history of multiple PEs in 1984, 1989, 2004-  electronic data reviewed in [place] and EMR and I was not able to find single CT angiogram that was positive for pulmonary embolus nor was I able to find Doppler studies that were positive for DVTs  . 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 Eye pain, right 05/16/2018  . Depression 05/16/2018  . Pale stool 05/16/2018  . Neuropathy 01/28/2018  . 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 Dysphonia 12/19/2017  . Left hip pain 09/06/2017  . Obesity, morbid ([place]) 09/28/2016  . Chronic pain syndrome 05/18/2016  . Hyperlipidemia 01/27/2014  . Headache(784.0) 09/29/2013  . Bilateral knee pain 05/25/2013  . Metatarsalgia of both feet 05/25/2013  . Eczema 05/12/2013  . Insomnia 05/12/2013  . Diverticulosis 04/18/2012  . Ovarian cyst, left 04/16/2012  . Breast cyst 10/02/2011  . Arthritis 02/07/2011  . Arthralgia 04/05/2009  . Essential hypertension 05/29/2006  . 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 ABDOMINAL HYSTERECTOMY  04/13/2010   done by Dr. [name]  . BREAST LUMPECTOMY WITH RADIOACTIVE SEED LOCALIZATION Right 07/09/2017   Procedure: RIGHT BREAST LUMPECTOMY WITH RADIOACTIVE SEED LOCALIZATION;  Surgeon: Erroll Luna, MD;  Location: [facility];  Service: General;  Laterality: Right;  . CHOLECYSTECTOMY     1997  . MASTECTOMY, PARTIAL  03/2006    left partial mastectomy secondary to  fibrocystic disease intraluminal fibroadenoma performed March 28, 2006 done by Dr. [name]     OB History   No obstetric history on file.     Family History  Problem Relation Age of Onset  . Cancer Other   . Clotting disorder Other   . Gout Father   . Stroke Neg Hx     Social History   Tobacco Use  . Smoking status: Never Smoker  . Smokeless tobacco: Never Used  Substance Use Topics  . Alcohol use: No    Alcohol/week: 0.0 standard drinks    Comment: rare  . Drug use: No    Home Medications Prior to Admission medications     Medication Sig Start Date End Date Taking? Authorizing Provider  albuterol (VENTOLIN HFA) 108 (90 Base) MCG/ACT inhaler Inhale 1-2 puffs into the lungs every 6 (six) hours as needed for wheezing or shortness of breath. 11/17/19  Yes Chundi, Vahini, MD  amLODipine (NORVASC) 10 MG tablet TAKE 1 TABLET BY MOUTH EVERY DAY Patient taking differently: Take 10 mg by mouth daily.  11/17/19  Yes Chundi, Verne Spurr, MD  Multiple Vitamin (MULTIVITAMIN) capsule Take 1 capsule daily by mouth.   Yes [provider]  chlorpheniramine-HYDROcodone (TUSSIONEX PENNKINETIC ER) 10-8 MG/5ML SUER Take 5 mLs by mouth 2 (two) times daily. 11/24/19   Mae Denunzio S, PA-C  sertraline (ZOLOFT) 25 MG tablet Take 1 tablet (25 mg total) by mouth daily. 05/05/19 06/04/19  Lars Mage, MD    Allergies    Dilaudid [hydromorphone hcl]  Review of Systems   Review of Systems  Constitutional: Positive for fatigue. Negative for chills and fever.  HENT: Negative for ear pain and sore throat.   Eyes: Negative for pain and visual disturbance.  Respiratory: Positive for cough and shortness of breath.   Cardiovascular: Negative for chest pain and palpitations.  Gastrointestinal: Negative for abdominal pain, constipation, diarrhea, nausea and vomiting.  Genitourinary: Negative for dysuria and hematuria.  Musculoskeletal: Negative for back pain.  Skin: Negative for rash.  Neurological: Negative for headaches.  All other systems reviewed and are negative.   Physical Exam Updated Vital Signs BP (!) 142/85   Pulse 92   Temp 98.5 F (36.9 C) (Oral)   Resp (!) 25   Ht 5\' 11"  (1.803 m)   Wt 124.7 kg   SpO2 92%   BMI 38.35 kg/m   Physical Exam Vitals and nursing [name] reviewed.  Constitutional:      General: She is not in acute distress.    Appearance: She is well-developed.  HENT:     Head: Normocephalic and atraumatic.  Eyes:     Conjunctiva/sclera: Conjunctivae normal.  Cardiovascular:     Rate and Rhythm:  Normal rate and regular rhythm.     Heart sounds: Normal heart sounds. No murmur.  Pulmonary:     Effort: Pulmonary effort is normal. No respiratory distress.     Breath sounds: Normal breath sounds. No decreased breath sounds, wheezing, rhonchi or rales.     Comments: Dry cough on exam. No tachypnea. Speaking in full sentences.  Abdominal:     Palpations: Abdomen is soft.     Tenderness: There is no abdominal tenderness.  Musculoskeletal:     Cervical back: Neck supple.     Comments: Trace ble edema  Skin:    General: Skin is warm and dry.  Neurological:     Mental Status: She is alert.     ED Results / Procedures / Treatments  Labs (all labs ordered are listed, but only abnormal results are displayed) Labs Reviewed  CBC WITH DIFFERENTIAL/PLATELET - Abnormal; Notable for the following components:      Result Value   HCT 35.7 (*)    All other components within normal limits  COMPREHENSIVE METABOLIC PANEL - Abnormal; Notable for the following components:   Glucose, Bld 105 (*)    Creatinine, Ser 1.03 (*)    GFR calc non Af Amer 59 (*)    All other components within normal limits  D-DIMER, QUANTITATIVE (NOT AT [facility])  BRAIN NATRIURETIC PEPTIDE  TROPONIN I (HIGH SENSITIVITY)    EKG EKG Interpretation  Date/Time:  Tuesday November 24 2019 20:00:49 EDT Ventricular Rate:  75 PR Interval:    QRS Duration: 82 QT Interval:  385 QTC Calculation: 430 R Axis:   55 Text Interpretation: Sinus rhythm Low voltage, precordial leads No significant change since prior 8/18 Confirmed by Aletta Edouard [phone]) on 11/24/2019 9:21:01 PM   Radiology DG Chest Portable 1 View  Result Date: 11/24/2019 CLINICAL DATA:  COVID, cough, shortness of breath EXAM: PORTABLE CHEST 1 VIEW COMPARISON:  11/09/2019 FINDINGS: Cardiomegaly, vascular congestion. No confluent opacities or effusions. No acute bony abnormality. IMPRESSION: Cardiomegaly, vascular congestion. Electronically Signed   By: Rolm Baptise  M.D.   On: 11/24/2019 20:08    Procedures Procedures (including critical care time)  Medications Ordered in ED Medications  albuterol (VENTOLIN HFA) 108 (90 Base) MCG/ACT inhaler 4 puff (4 puffs Inhalation Given 11/24/19 2015)  benzonatate (TESSALON) capsule 100 mg (100 mg Oral Given 11/24/19 2011)  aerochamber Z-Stat Plus/medium 1 each (1 each Other Given 11/24/19 2015)    ED Course  I have reviewed the triage vital signs and the nursing notes.  Pertinent labs & imaging results that were available during my care of the patient were reviewed by me and considered in my medical decision making (see chart for details).    MDM Rules/Calculators/A&P                      59 year old female recently diagnosed with Covid presenting for evaluation of continued shortness of breath and persistent cough.  Vital signs reassuring.  Satting well on room air.   CBC is without leukocytosis or anemia CMP with normal kidney function, liver function and electrolytes D-dimer negative BNP negative Troponin negative  EKG with Sinus rhythm Low voltage, precordial leads No significant change since prior 8/18  CXR suggests pulmonary vascular congestion however bnp is normal making chf less likely. Changes may be secondary to covid.  On final recheck pt continues to sat well on RA with sats at 97% with RR of 16.  She was able to ambulate with pulse ox at 98% on room air.  She appears stable today and does not appear to meet admission criteria.  Have advised her to continue her albuterol at home.  We will also give Rx for cough medication.  Have advised her to buy a pulse oximeter for home and monitor symptoms closely.  Advise close PCP follow-up and strict return precautions.  She voices understanding of plan and reasons to return.  Questions answered.  Patient stable for discharge.  Patient was seen in conjunction with Dr. Melina Copa personally evaluated the patient is agree with plan.  [name] was  evaluated in Emergency Department on 11/24/2019 for the symptoms described in the history of present illness. She was evaluated in the context of the global COVID-19 pandemic, which necessitated consideration  that the patient might be at risk for infection with the SARS-CoV-2 virus that causes COVID-19. Institutional protocols and algorithms that pertain to the evaluation of patients at risk for COVID-19 are in a state of rapid change based on information released by regulatory bodies including the CDC and federal and state organizations. These policies and algorithms were followed during the patient's care in the ED.    Final Clinical Impression(s) / ED Diagnoses Final diagnoses:  [id number]    Rx / DC Orders ED Discharge Orders         Ordered    chlorpheniramine-HYDROcodone (TUSSIONEX PENNKINETIC ER) 10-8 MG/5ML SUER  2 times daily     11/24/19 2246           Naraya Stoneberg S, PA-C 11/24/19 2246    Hayden Rasmussen, MD 11/25/19 1500

## 2019-11-24 NOTE — ED Notes (Signed)
Pt was able to maintain o2 saturation at 98% while ambulating with room air.

## 2019-11-24 NOTE — ED Triage Notes (Signed)
Pt dx with covid on 11/09/19. Came in today due to increasing SOB and cough since then. Pt states the SOB is constant, has some pain at night in legs and arms. Denies fevers.

## 2019-11-26 ENCOUNTER — Telehealth: Payer: Self-pay | Admitting: Internal Medicine

## 2019-11-26 NOTE — Telephone Encounter (Signed)
Received message from C. Cyndi Bender that she scheduled telehealth [facility] appt @ 1015 for tomorrow R/T ED f/u-Covid.  Bertram Denver states pt was SOB on phone when scheduling f/u appt and she offered pt an appt today, which pt declined and requested appointment tomorrow.    This RN placed TC to patient to evaluate SOB and need for earlier appt, no answer, and VM was full. Pt did receive ED discharge instructions to return to ED for any new or worsening symptoms. SChaplin, RN,BSN

## 2019-11-26 NOTE — Telephone Encounter (Signed)
Note   Called pt Per Dr. Maricela Bo to schedule a  Follow up visit after 11/24/2019 [name] ED visit.  Offered patient an in person or tele health visit for today 11/26/2019.  Pt having sob and hard to speak and requested to be scheduled an appointment for tomorrow  11/27/2019 instead.  Please call patient back.

## 2019-11-27 ENCOUNTER — Ambulatory Visit (INDEPENDENT_AMBULATORY_CARE_PROVIDER_SITE_OTHER): Payer: HRSA Program | Admitting: Internal Medicine

## 2019-11-27 ENCOUNTER — Encounter: Payer: Self-pay | Admitting: Internal Medicine

## 2019-11-27 DIAGNOSIS — U071 COVID-19: Secondary | ICD-10-CM | POA: Insufficient documentation

## 2019-11-27 MED ORDER — BENZONATATE 100 MG PO CAPS: 100.0000 mg | ORAL_CAPSULE | Freq: Three times a day (TID) | ORAL | 1 refills | Status: DC | PRN

## 2019-11-27 MED ORDER — GUAIFENESIN-DM 100-10 MG/5ML PO SYRP: 5.0000 mL | ORAL_SOLUTION | ORAL | 0 refills | Status: DC | PRN

## 2019-11-27 NOTE — Progress Notes (Signed)
   [facility] Telephone Encounter  Reason for call:   This telephone encounter was created for Ms. [name] on 11/27/2019 for the following purpose/cc Covid 19 follow up.   Pertinent Data:   Diagnosed with covid in March, lingering symptoms recent ED visit with no O2 requirements ROS:  Cardiac: pt denies palpitations, chest pain,   Abdominal: pt denies abdominal pain, nausea, vomiting, or diarrhea   Assessment / Plan / Recommendations:   Covid 19 follow up: cough improving. Shortness of breath improving.  No other symptoms.  Mainly gets short of breath now when exerting herself.  She does have a pulse oximeter I asked her to do a few measurements for me.  At rest she is 96-98% at rest and 94% with exertion walking outside and up and down her steps.  Her heart rate remained below 100 during her walk.  Recent lab work, imaging and pulse ox readings reassuring.  Negative D dimer.  Her cough is persistent and troublesome for her.  Plan:   add tussionex and robitussin DM.  She is using tussionex sparingly  Return precautions given, she will go to the ED if worsening symptoms and/or pulse ox readings  Overdue for PCP follow up they will need to continue discussion about anticoagulation  As always, pt is advised that if symptoms worsen or new symptoms arise, they should go to an urgent care facility or to to ER for further evaluation.   Consent and Medical Decision Making:   Patient discussed with Dr. Angelia Mould  This is a telephone encounter between [name] and Vickki Muff on 11/27/2019 for Covid 19 follow up. The visit was conducted with the patient located at home and Vickki Muff at [facility]. The patient's identity was confirmed using their DOB and current address. The patient has consented to being evaluated through a telephone encounter and understands the associated risks (an examination cannot be done and the patient may need to come in  for an appointment) / benefits (allows the patient to remain at home, decreasing exposure to coronavirus). I personally spent 13 minutes on medical discussion.

## 2019-11-27 NOTE — Assessment & Plan Note (Signed)
cough improving. Shortness of breath improving.  No other symptoms.  Mainly gets short of breath now when exerting herself.  She does have a pulse oximeter I asked her to do a few measurements for me.  At rest she is 96-98% at rest and 94% with exertion walking outside and up and down her steps.  Her heart rate remained below 100 during her walk.  Recent lab work, imaging and pulse ox readings reassuring.  Negative D dimer.  Her cough is persistent and troublesome for her.   -add tussionex and robitussin DM.  She is using tussionex sparingly -Return precautions given, she will go to the ED if worsening symptoms and/or pulse ox readings -Overdue for PCP follow up they will need to continue discussion about anticoagulation

## 2019-12-01 NOTE — Progress Notes (Signed)
Internal Medicine Clinic Attending  Case discussed with Dr. Winfrey  at the time of the visit.  We reviewed the resident's history and exam and pertinent patient test results.  I agree with the assessment, diagnosis, and plan of care documented in the resident's note.  

## 2020-01-14 ENCOUNTER — Encounter: Payer: Self-pay | Admitting: Internal Medicine

## 2020-01-26 ENCOUNTER — Encounter: Payer: Self-pay | Admitting: Internal Medicine

## 2020-02-04 ENCOUNTER — Encounter: Payer: Self-pay | Admitting: Internal Medicine

## 2020-02-04 ENCOUNTER — Ambulatory Visit (INDEPENDENT_AMBULATORY_CARE_PROVIDER_SITE_OTHER): Payer: Medicare Other | Admitting: Internal Medicine

## 2020-02-04 ENCOUNTER — Other Ambulatory Visit: Payer: Self-pay

## 2020-02-04 VITALS — BP 121/76 | HR 72 | Temp 98.4°F | Ht 71.0 in | Wt 287.3 lb

## 2020-02-04 DIAGNOSIS — M25562 Pain in left knee: Secondary | ICD-10-CM | POA: Diagnosis not present

## 2020-02-04 DIAGNOSIS — M1990 Unspecified osteoarthritis, unspecified site: Secondary | ICD-10-CM | POA: Diagnosis not present

## 2020-02-04 DIAGNOSIS — Z862 Personal history of diseases of the blood and blood-forming organs and certain disorders involving the immune mechanism: Secondary | ICD-10-CM

## 2020-02-04 DIAGNOSIS — M7541 Impingement syndrome of right shoulder: Secondary | ICD-10-CM

## 2020-02-04 DIAGNOSIS — Z86718 Personal history of other venous thrombosis and embolism: Secondary | ICD-10-CM

## 2020-02-04 DIAGNOSIS — Z0000 Encounter for general adult medical examination without abnormal findings: Secondary | ICD-10-CM

## 2020-02-04 DIAGNOSIS — F32A Depression, unspecified: Secondary | ICD-10-CM

## 2020-02-04 DIAGNOSIS — I10 Essential (primary) hypertension: Secondary | ICD-10-CM

## 2020-02-04 DIAGNOSIS — F329 Major depressive disorder, single episode, unspecified: Secondary | ICD-10-CM | POA: Diagnosis not present

## 2020-02-04 DIAGNOSIS — G8929 Other chronic pain: Secondary | ICD-10-CM | POA: Diagnosis not present

## 2020-02-04 DIAGNOSIS — R0609 Other forms of dyspnea: Secondary | ICD-10-CM | POA: Insufficient documentation

## 2020-02-04 DIAGNOSIS — M25561 Pain in right knee: Secondary | ICD-10-CM | POA: Diagnosis not present

## 2020-02-04 DIAGNOSIS — R0600 Dyspnea, unspecified: Secondary | ICD-10-CM

## 2020-02-04 DIAGNOSIS — Z1231 Encounter for screening mammogram for malignant neoplasm of breast: Secondary | ICD-10-CM | POA: Diagnosis not present

## 2020-02-04 MED ORDER — AMLODIPINE BESYLATE 10 MG PO TABS: 10.0000 mg | ORAL_TABLET | Freq: Every day | ORAL | 1 refills | Status: DC

## 2020-02-04 NOTE — Patient Instructions (Addendum)
It was a pleasure to see you today [name]. Please make the following changes:  For your shortness of breath: We will get iron studies  Refer you to pulmonology for covid clinic  You maybe deconditioned, follow with physical therapy   Please get mammogram and colonoscopy completed   You can use voltaren gel for both right shoulder and left knee pain. You have been referred to physical therapy  If you have any questions or concerns, please call our clinic at [phone] between 9am-5pm and after hours call [phone] and ask for the internal medicine resident on call. If you feel you are having a medical emergency please call 911.   Thank you, we look forward to help you remain healthy!  Lars Mage, MD Internal Medicine PGY3   To schedule an appointment for a COVID vaccine or be added to the vaccine wait list: Go to [email or web address]   OR Go to [email or web address]                  OR Call [phone]                                     OR Call [phone] and select Option 2  [place] ([place]) [address] [place], [place] [address] Hours: Mon,Thu 8-5, Sat 8-12 Type: [place] (12+)  [place]  ([place]) [place] [employer] [address],  [id number] Hours: Thu: 1-5 Type: Pfizer (12+)

## 2020-02-04 NOTE — Assessment & Plan Note (Signed)
Discussed again about the risks of the patient getting another clot if she is not on a blood thinner.   The patient states that she doesn't like the way that the patient feels when she is on a blood thinner. She doesn't like the way that the blood thinners have made her feel. It make her feel sluggish. Patient was previously on warfarin. Spoke about noac agents. She refused therapy at this time. She understands the risks of not taking blood thinner.

## 2020-02-04 NOTE — Assessment & Plan Note (Signed)
The patient's blood pressure during this visit was 121/76. The patient is currently taking amlodipine 10mg  qd. Her last blood pressure visits are   BP Readings from Last 3 Encounters:  02/04/20 121/76  11/24/19 130/81  11/12/19 130/80  Patient's electrolytes and kidney function as of April 2021 were stable.   Assessment and Plan Patient's blood pressure is well controlled. Will continue current regimen.

## 2020-02-04 NOTE — Assessment & Plan Note (Signed)
  Patient has pain in her left knee. She describes the pain in the shoulder and knee as 10/10. She feels that the pain has been slowing her down, but she has been pushing herself to get work done. She has noticed that the pain decreases when works out and doing [employer]. She has gained 12lbs in the past few months.   Left knee xray showed moderate osteoarthritic changes in 3 joint compartments in August 2018.  Assessment and plan  Recommended physical therapy and voltaren gel. She was offered steroid injection to the knee which she refused. Anticipate improvement in pain with weight reduction.

## 2020-02-04 NOTE — Assessment & Plan Note (Signed)
Patient has already received covid vaccine. This has been documented in emr with approximate dates that the patient could recall.   Will refer for mammogram and colonoscopy.

## 2020-02-04 NOTE — Assessment & Plan Note (Signed)
Patient states that she has been having pain in her right shoulder that is worsening for the past one month or so. She has been using voltaren gel which only helps slightly. Denies morning stiffness.  Assessment and plan  Neer's and hawkins test was positive suggestive of possible shoulder impingement. She will be referred to physical therapy. She can use voltaren gel for pain control.

## 2020-02-04 NOTE — Progress Notes (Signed)
Internal Medicine Clinic Attending  Case discussed with Dr. Chundi at the time of the visit.  We reviewed the resident's history and exam and pertinent patient test results.  I agree with the assessment, diagnosis, and plan of care documented in the resident's note. 

## 2020-02-04 NOTE — Addendum Note (Signed)
Addended by: Lars Mage on: 02/04/2020 01:18 PM   Modules accepted: Orders

## 2020-02-04 NOTE — Assessment & Plan Note (Signed)
Patient states that she been having occasional dyspnea when she walks. Has accompanied fatigue and noticed dizziness when she laughs too hard. She has noticed these symptoms since she was diagnosed with covid pneumonia in March 2021. Denies chest pain, dizziness, palpitations, no lower extremity edema. Gained 12lbs in the past 2 months. She states that she was feeling better prior to covid when she was working out.   She does not smoke currently. She smoked occasionally when she was in high school.She had a myocardial stress test done in 2015 that was normal. Patient's electrolytes and blood counts within normal range. She has been needing to take albuterol 1-2 times per week which is increased to the prior usage of only few times per month.   Assessment and plan  Patients symptoms of fatigue, dyspnea, dizziness since covid illness is concerning for covid long hauler symptoms. She scores a 3 on Borg scale for intensity of dyspnea and fatigue with covid. Will refer to pulmonology's covid clinic.   Will check iron studies to evaluate for possible iron deficiency. She has had a low hemoglobin chronicially. Will refer to chronic care management to sign her up for silver sneakers program as there maybe an element of deconditioning. She has refused nutritionist consult.

## 2020-02-04 NOTE — Progress Notes (Signed)
   CC: Essential htn   HPI:  [name] is a 59 y.o. with essential htn, hx of covid19 infection, hx of multiple pulmonary embolisms, depression who presents for follow up of of essential htn. Please see problem based charting for evaluation, assessment, and plan.  Past Medical History:  Diagnosis Date  . Anemia 2009    Baseline hemoglobin at 11, secondary to iron deficiency  . Arthritis   . Breast mass 02/2008    Noted on mammogram March 11, 2008 3 cm simple cyst at 9 to 10:00 position of the right breast as well as multiple other smaller simple cyst, 2.6 cm mass at the 10:00 position of the right breast also representing complex cyst,  . Deep vein thrombosis (DVT) (HCC)     history of multiple  DVTs in 1989, 1989, 2004, on chronic anticoagulation with Coumadin  . Diverticulosis    noted colonoscopy 2010  . Eczema   . Fibroid uterus  2001    status post total abdominal hysterectomy, right and left salpingo-oophorectomy , done by Dr. [name]  . Heart murmur    as a child  . Hypertension   . PE (pulmonary embolism)     history of multiple PEs in 1984, 1989, 2004-  electronic data reviewed in [place] and EMR and I was not able to find single CT angiogram that was positive for pulmonary embolus nor was I able to find Doppler studies that were positive for DVTs  . 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  Cardiovascular: Normal rate and  regular rhythm.  Respiratory: Effort normal and breath sounds normal. No stridor. No respiratory distress.  GI: Soft. Normal appearance and bowel sounds are normal.  Musculoskeletal:     Comments: 4/5 strength in bilateral lower extremities, 5/5 strength in bilateral upper extremities  Neurological: She is alert and oriented to person, place, and time.  Psychiatric: Her behavior is normal. Mood, judgment and thought content normal.   Assessment & Plan:   See Encounters Tab for problem based charting.  Patient discussed with Dr. Philipp Ovens

## 2020-02-04 NOTE — Assessment & Plan Note (Signed)
Patient's phq9 is 5. Patient was previously being prescribed sertraline 25mg  qd but she has stopped taking the medication.  -continue to monitor for mood changes and need for medication assistance.

## 2020-02-05 ENCOUNTER — Encounter: Payer: Self-pay | Admitting: *Deleted

## 2020-02-05 ENCOUNTER — Ambulatory Visit: Payer: Self-pay

## 2020-02-05 LAB — CBC
Hematocrit: 38.6 % (ref 34.0–46.6)
Hemoglobin: 12.8 g/dL (ref 11.1–15.9)
MCH: 29.4 pg (ref 26.6–33.0)
MCHC: 33.2 g/dL (ref 31.5–35.7)
MCV: 89 fL (ref 79–97)
Platelets: 310 10*3/uL (ref 150–450)
RBC: 4.35 x10E6/uL (ref 3.77–5.28)
RDW: 14.1 % (ref 11.7–15.4)
WBC: 5.6 10*3/uL (ref 3.4–10.8)

## 2020-02-05 LAB — IRON AND TIBC
Iron Saturation: 22 % (ref 15–55)
Iron: 67 ug/dL (ref 27–139)
Total Iron Binding Capacity: 309 ug/dL (ref 250–450)
UIBC: 242 ug/dL (ref 118–369)

## 2020-02-05 LAB — FERRITIN: Ferritin: 123 ng/mL (ref 15–150)

## 2020-02-05 NOTE — Chronic Care Management (AMB) (Signed)
  Care Management   Social Work Note  02/05/2020 Name: [name] MRN: [id number] DOB: Oct 30, 1960  [name] is a 59 y.o. year old female who sees Chundi, Verne Spurr, MD for primary care. The Care Management team was consulted for assistance with community resources for affordable exercise programs.     Request to CCM team to assist patient with enrolling in [place] patient today and informed her that [place] program is not a benefit of her particular type of Medicare plan. Informed patient that local [employer]'s typically offer financial assistance.  Offered to provide contact information so that she could call to further discuss eligibility and application process.  Patient declined stating that she cannot afford any out of pocket cost for gym membership/exercise program.  Provided patient with contact information for [facility] and encouraged her to call about possible programs. Also informed her that they may have a representative from [facility].  This would allow her to talk with someone about the specifics of other Medicare plans.         Ronn Melena, [place] Coordination Social Worker [place] [phone]

## 2020-02-11 ENCOUNTER — Ambulatory Visit: Payer: Medicare Other

## 2020-02-12 ENCOUNTER — Other Ambulatory Visit: Payer: Self-pay

## 2020-02-12 ENCOUNTER — Ambulatory Visit (INDEPENDENT_AMBULATORY_CARE_PROVIDER_SITE_OTHER): Payer: Medicare Other

## 2020-02-12 ENCOUNTER — Encounter: Payer: Self-pay | Admitting: Internal Medicine

## 2020-02-12 ENCOUNTER — Ambulatory Visit (INDEPENDENT_AMBULATORY_CARE_PROVIDER_SITE_OTHER): Payer: Medicare Other | Admitting: Internal Medicine

## 2020-02-12 DIAGNOSIS — R0600 Dyspnea, unspecified: Secondary | ICD-10-CM

## 2020-02-12 DIAGNOSIS — R0609 Other forms of dyspnea: Secondary | ICD-10-CM

## 2020-02-12 DIAGNOSIS — R058 Other specified cough: Secondary | ICD-10-CM

## 2020-02-12 DIAGNOSIS — R05 Cough: Secondary | ICD-10-CM | POA: Diagnosis not present

## 2020-02-12 NOTE — Progress Notes (Signed)
[name], female    DOB: [birth year],   MRN: [id number]   Brief patient profile:  38 yobf never smoker worked as [employer] and taking BCP > PE around [place]  While pregnant rx x one year then moved [place]  recurred around  2004 and stopped 2007 and did fine October 31 2019 when got  [place] and November 08 2019 dx COVID 19  With  fever cough   p multiple contacts and dx on March 25 bamlizumab >  Persistent cough /sob > rx tessalon  But persistent doe x across the room so referred to pulmonary clinic 02/12/2020 by  Velna Ochs.         History of Present Illness  02/12/2020  Pulmonary/ 1st office eval/Rilen Shukla  Chief Complaint  Patient presents with  . Pulmonary Consult    Referred by Dr. Velna Ochs. Pt c/o DOE since had Covid 06 November 2019. She states gets winded walking up stairs or short distances such as lobby to first exam room today. She uses her albuterol inhaler 1-2 x per day. She also c/o non prod cough- worse with talking.    Dyspnea: room to room  Cough: still taking tessalon once daily, still have tickle daytime assoc with sense of nasal congestin/ pnds  but no excess mucus  complaints/ cough brought on by talking / strong smells   Sleep: bed flat and uses bunch of pillows  SABA use: helps some / doesn't use more than once a day and never prechallenges Overall 40% better vs nadir in terms of cough and sob   No obvious day to day or daytime variability or assoc excess/ purulent sputum or mucus plugs or hemoptysis or cp or chest tightness, subjective wheeze or overt  hb symptoms.   Sleeps ok as above without nocturnal  or early am exacerbation  of respiratory  c/o's or need for noct saba. Also denies any obvious fluctuation of symptoms with weather or environmental changes or other aggravating or alleviating factors except as outlined above   No unusual exposure hx or h/o childhood pna/ asthma or knowledge of premature birth.  Current Allergies, Complete Past Medical  History, Past Surgical History, Family History, and Social History were reviewed in [employer] record.  ROS  The following are not active complaints unless bolded Hoarseness, sore throat, dysphagia, dental problems, itching, sneezing,  nasal congestion or discharge of excess mucus or purulent secretions, ear ache,   fever, chills, sweats, unintended wt loss or wt gain, classically pleuritic or exertional cp,  orthopnea pnd or arm/hand swelling  or leg swelling, presyncope, palpitations, abdominal pain, anorexia, nausea, vomiting, diarrhea  or change in bowel habits or change in bladder habits, change in stools or change in urine, dysuria, hematuria,  rash, arthralgias, visual complaints, headache, numbness, weakness or ataxia or problems with walking or coordination,  change in mood or  memory.           Past Medical History:  Diagnosis Date  . Anemia 2009    Baseline hemoglobin at 11, secondary to iron deficiency  . Arthritis   . Breast mass 02/2008    Noted on mammogram March 11, 2008 3 cm simple cyst at 9 to 10:00 position of the right breast as well as multiple other smaller simple cyst, 2.6 cm mass at the 10:00 position of the right breast also representing complex cyst,  . Deep vein thrombosis (DVT) (HCC)     history of multiple  DVTs in 1989, 1989, 2004, on chronic anticoagulation with Coumadin  . Diverticulosis    noted colonoscopy 2010  . Eczema   . Fibroid uterus  2001    status post total abdominal hysterectomy, right and left salpingo-oophorectomy , done by Dr. [name]  . Heart murmur    as a child  . Hypertension   . PE (pulmonary embolism)     history of multiple PEs in 1984, 1989, 2004-  electronic data reviewed in [place] and EMR and I was not able to find single CT angiogram that was positive for pulmonary embolus nor was I able to find Doppler studies that were positive for DVTs  . 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 albuterol (VENTOLIN HFA) 108 (90 Base) MCG/ACT inhaler Inhale 1-2 puffs into the lungs every 6 (six) hours as needed for wheezing or shortness of breath. 18 g 1  . amLODipine (NORVASC) 10 MG tablet Take 1 tablet (10 mg total) by mouth daily. 90 tablet 1  . Multiple Vitamin (MULTIVITAMIN) capsule Take 1 capsule daily by mouth.     No facility-administered medications prior to visit.     Objective:     BP 118/76 (BP Location: Left Arm, Cuff Size: Large)   Pulse 77   Temp 98.4 F (36.9 C) (Temporal)   Ht 5\' 11"  (1.803 m)   Wt 287 lb (130.2 kg)   SpO2 99% Comment: on RA  BMI 40.03 kg/m   SpO2: 99 % (on RA)   amb obese bf nad   HEENT : pt wearing mask not removed for exam due to covid -19 concerns.    NECK :  without JVD/Nodes/TM/ nl carotid upstrokes bilaterally   LUNGS: no acc muscle use,  Nl contour chest which is clear to A and P bilaterally without cough on insp or exp maneuvers   CV:  RRR  no s3 or murmur or increase in P2, and no edema   ABD:  Mod obese soft and nontender with nl inspiratory excursion in the supine position. No bruits or organomegaly appreciated, bowel sounds nl  MS:  Nl gait/ ext warm without deformities, calf tenderness, cyanosis or clubbing No obvious joint restrictions   SKIN: warm and dry without lesions    NEURO:  alert, approp, nl sensorium with  no motor or cerebellar deficits apparent.    CXR PA and Lateral:   02/12/2020 :    I personally reviewed images and  impression as follows:    wnl   Labs ordered/ reviewed:      Chemistry      Component Value Date/Time   NA 138 02/12/2020 1529   NA 140 05/05/2019 1052   K 4.1 02/12/2020 1529   CL 101 02/12/2020  1529   CO2 28 02/12/2020 1529   BUN 11 02/12/2020 1529   BUN 9 05/05/2019 1052   CREATININE 0.93 02/12/2020 1529      Component Value Date/Time   CALCIUM 9.7 02/12/2020 1529   ALKPHOS 80 11/24/2019 2026   AST 21 11/24/2019 2026   ALT 28 11/24/2019 2026   BILITOT 0.4 11/24/2019 2026   BILITOT 0.4 05/15/2018 1012        Lab Results  Component Value  Date   WBC 6.1 02/12/2020   HGB 12.1 02/12/2020   HCT 36.5 02/12/2020   MCV 87.3 02/12/2020   PLT 317 02/12/2020       EOS                                                              49                                      02/12/2020   Lab Results  Component Value Date   DDIMER 0.34 02/12/2020      Lab Results  Component Value Date   TSH 0.81 02/12/2020     Lab Results  Component Value Date   PROBNP 22.7 01/06/2014       Lab Results  Component Value Date   ESRSEDRATE 36 (H) 02/12/2020   ESRSEDRATE 24 04/16/2016   ESRSEDRATE 33 01/10/2016              Assessment   DOE (dyspnea on exertion) Onset with covid dx November 08 2019 p Bamlizumab March 15 but no other rx - 02/12/2020   Walked on RA pace x one lap =  approx 250 ft -@ avg  pace stopped due to sob with sats still  97%  Sob "room to room"  Doe 3 m out from covid 19 infection =    markedly disproportionate to objective findings and not clear to what extent this is actually a pulmonary  problem but pt does appear to have difficult to sort out respiratory symptoms of unknown origin for which  DDX  = almost all start with A and  include Adherence, Ace Inhibitors, Acid Reflux, Active Sinus Disease, Alpha 1 Antitripsin deficiency, Anxiety masquerading as Airways dz,  ABPA,  Allergy(esp in young), Aspiration (esp in elderly), Adverse effects of meds,  Active smoking or Vaping, A bunch of PE's/clot burden (a few small clots can't cause this syndrome unless there is already severe underlying pulm or vascular dz with poor reserve),  Anemia or thyroid disorder, plus two Bs  =  Bronchiectasis and Beta blocker use..and one C= CHF  Adherence is always the initial "prime suspect" and is a multilayered concern that requires a "trust but verify" approach in every patient - starting with knowing how to use medications, especially inhalers, correctly, keeping up with refills and understanding the fundamental difference between maintenance and prns vs those medications only taken for a very short course and then stopped and not refilled.   ? Allergy/asthma - I spent extra time with pt today reviewing appropriate use of albuterol for prn use on exertion with the following points: 1) saba is for relief of sob that does not improve by walking a slower pace or resting but rather if the pt does not improve after trying this first. 2) If the pt is convinced, as many are, that saba helps recover from activity faster then it's easy to tell if this is the case by re-challenging : ie stop, take the inhaler, then p 5 minutes try the exact same activity (intensity of workload) that just caused the symptoms and see if they are substantially diminished or not after saba 3) if there is  an activity that reproducibly causes the symptoms, try the saba 15 min before the activity on alternate days   If in fact the saba really does help, then fine to continue to use it prn but advised may need to look closer at the maintenance regimen being used to achieve better control of airways disease with exertion.   ? Adverse drug effects > none of the usual suspects   ? Anemia/ thyroid dz > excluded with nl bicarb levels as well  ? ? Anxiety/depression/ decondtioning  > usually at the bottom of this list of usual suspects but   may interfere with adherence and also interpretation of response or lack thereof to symptom management which can be quite subjective.   D dimer nl - while a normal  or high normal value (seen commonly in the elderly or chronically ill)  may miss small peripheral pe, the clot burden  with sob is moderately high and the d dimer  has a very high neg pred value if used in this setting.    ? chf > ruled out by cxr/ bnp    >>>   Will try empically rx for gerd and reconditioning ex and f/u with cpst in 4 weeks if not making progress.             Upper airway cough syndrome Upper airway cough syndrome (previously labeled PNDS),  is so named because it's frequently impossible to sort out how much is  CR/sinusitis with freq throat clearing (which can be related to primary GERD)   vs  causing  secondary (" extra esophageal")  GERD from wide swings in gastric pressure that occur with throat clearing, often  promoting self use of mint and menthol lozenges that reduce the lower esophageal sphincter tone and exacerbate the problem further in a cyclical fashion.   These are the same pts (now being labeled as having "irritable larynx syndrome" by some cough centers) who not infrequently have a history of having failed to tolerate ace inhibitors,  dry powder inhalers or biphosphonates or report having atypical/extraesophageal reflux symptoms that don't respond to standard doses of PPI  and are easily confused as having aecopd or asthma flares by even experienced allergists/ pulmonologists (myself included).   Of the three most common causes of  Sub-acute / recurrent or chronic cough, only one (GERD)  can actually contribute to/ trigger  the other two (asthma and post nasal drip syndrome)  and perpetuate the cylce of cough.  While not intuitively obvious, many patients with chronic low grade reflux do not cough until there is a primary insult that disturbs the protective epithelial barrier and exposes sensitive nerve endings.   This is typically post  viral but can due to PNDS and  either may apply here.   The point is that once this occurs, it is difficult to eliminate the cycle  using anything but a maximally effective acid suppression regimen at least in the short run, accompanied by an  appropriate diet to address non acid GERD and control / eliminate the urge to clear the throat with hard rock candies than don't contain mint/ menthol           Each maintenance medication was reviewed in detail including emphasizing most importantly the difference between maintenance and prns and under what circumstances the prns are to be triggered using an action plan format where appropriate.  Total time for H and P, chart review, counseling, teaching about use of hfa device/  directly observing portions of ambulatory 02 saturation study/  and generating customized AVS unique to this office visit / charting = 60 min           Christinia Gully, MD 02/13/2020

## 2020-02-12 NOTE — Patient Instructions (Addendum)
Please remember to go to the lab and x-ray department   for your tests - we will call you with the results when they are available.     Late add: Will try empically rx for gerd and reconditioning ex and f/u with cpst in 4 weeks if not making progress.

## 2020-02-13 ENCOUNTER — Encounter: Payer: Self-pay | Admitting: Internal Medicine

## 2020-02-13 LAB — CBC WITH DIFFERENTIAL/PLATELET
Absolute Monocytes: 488 cells/uL (ref 200–950)
Basophils Absolute: 12 cells/uL (ref 0–200)
Basophils Relative: 0.2 %
Eosinophils Absolute: 49 cells/uL (ref 15–500)
Eosinophils Relative: 0.8 %
HCT: 36.5 % (ref 35.0–45.0)
Hemoglobin: 12.1 g/dL (ref 11.7–15.5)
Lymphs Abs: 2715 cells/uL (ref 850–3900)
MCH: 28.9 pg (ref 27.0–33.0)
MCHC: 33.2 g/dL (ref 32.0–36.0)
MCV: 87.3 fL (ref 80.0–100.0)
MPV: 9.7 fL (ref 7.5–12.5)
Monocytes Relative: 8 %
Neutro Abs: 2837 cells/uL (ref 1500–7800)
Neutrophils Relative %: 46.5 %
Platelets: 317 10*3/uL (ref 140–400)
RBC: 4.18 10*6/uL (ref 3.80–5.10)
RDW: 13.8 % (ref 11.0–15.0)
Total Lymphocyte: 44.5 %
WBC: 6.1 10*3/uL (ref 3.8–10.8)

## 2020-02-13 LAB — BASIC METABOLIC PANEL
BUN: 11 mg/dL (ref 7–25)
CO2: 28 mmol/L (ref 20–32)
Calcium: 9.7 mg/dL (ref 8.6–10.4)
Chloride: 101 mmol/L (ref 98–110)
Creat: 0.93 mg/dL (ref 0.50–0.99)
Glucose, Bld: 85 mg/dL (ref 65–99)
Potassium: 4.1 mmol/L (ref 3.5–5.3)
Sodium: 138 mmol/L (ref 135–146)

## 2020-02-13 LAB — BRAIN NATRIURETIC PEPTIDE: Brain Natriuretic Peptide: 11 pg/mL (ref <100)

## 2020-02-13 LAB — TSH: TSH: 0.81 mIU/L (ref 0.40–4.50)

## 2020-02-13 LAB — SEDIMENTATION RATE: Sed Rate: 36 mm/h — ABNORMAL HIGH (ref 0–30)

## 2020-02-13 LAB — D-DIMER, QUANTITATIVE: D-Dimer, Quant: 0.34 mcg/mL FEU (ref <0.50)

## 2020-02-13 NOTE — Assessment & Plan Note (Addendum)
Onset with covid dx November 08 2019 p Bamlizumab March 15 but no other rx - 02/12/2020   Walked on RA pace x one lap =  approx 250 ft -@ avg  pace stopped due to sob with sats still  97%  Sob "room to room"  Doe 3 m out from covid 19 infection =    markedly disproportionate to objective findings and not clear to what extent this is actually a pulmonary  problem but pt does appear to have difficult to sort out respiratory symptoms of unknown origin for which  DDX  = almost all start with A and  include Adherence, Ace Inhibitors, Acid Reflux, Active Sinus Disease, Alpha 1 Antitripsin deficiency, Anxiety masquerading as Airways dz,  ABPA,  Allergy(esp in young), Aspiration (esp in elderly), Adverse effects of meds,  Active smoking or Vaping, A bunch of PE's/clot burden (a few small clots can't cause this syndrome unless there is already severe underlying pulm or vascular dz with poor reserve),  Anemia or thyroid disorder, plus two Bs  = Bronchiectasis and Beta blocker use..and one C= CHF  Adherence is always the initial "prime suspect" and is a multilayered concern that requires a "trust but verify" approach in every patient - starting with knowing how to use medications, especially inhalers, correctly, keeping up with refills and understanding the fundamental difference between maintenance and prns vs those medications only taken for a very short course and then stopped and not refilled.   ? Allergy/asthma - I spent extra time with pt today reviewing appropriate use of albuterol for prn use on exertion with the following points: 1) saba is for relief of sob that does not improve by walking a slower pace or resting but rather if the pt does not improve after trying this first. 2) If the pt is convinced, as many are, that saba helps recover from activity faster then it's easy to tell if this is the case by re-challenging : ie stop, take the inhaler, then p 5 minutes try the exact same activity (intensity of  workload) that just caused the symptoms and see if they are substantially diminished or not after saba 3) if there is an activity that reproducibly causes the symptoms, try the saba 15 min before the activity on alternate days   If in fact the saba really does help, then fine to continue to use it prn but advised may need to look closer at the maintenance regimen being used to achieve better control of airways disease with exertion.   ? Adverse drug effects > none of the usual suspects   ? Anemia/ thyroid dz > excluded with nl bicarb levels as well  ? ? Anxiety/depression/ decondtioning  > usually at the bottom of this list of usual suspects but   may interfere with adherence and also interpretation of response or lack thereof to symptom management which can be quite subjective.   D dimer nl - while a normal  or high normal value (seen commonly in the elderly or chronically ill)  may miss small peripheral pe, the clot burden with sob is moderately high and the d dimer  has a very high neg pred value if used in this setting.    ? chf > ruled out by cxr/ bnp    Will try empically rx for gerd and reconditioning ex and f/u with cpst in 4 weeks if not making progress.

## 2020-02-13 NOTE — Assessment & Plan Note (Signed)
Upper airway cough syndrome (previously labeled PNDS),  is so named because it's frequently impossible to sort out how much is  CR/sinusitis with freq throat clearing (which can be related to primary GERD)   vs  causing  secondary (" extra esophageal")  GERD from wide swings in gastric pressure that occur with throat clearing, often  promoting self use of mint and menthol lozenges that reduce the lower esophageal sphincter tone and exacerbate the problem further in a cyclical fashion.   These are the same pts (now being labeled as having "irritable larynx syndrome" by some cough centers) who not infrequently have a history of having failed to tolerate ace inhibitors,  dry powder inhalers or biphosphonates or report having atypical/extraesophageal reflux symptoms that don't respond to standard doses of PPI  and are easily confused as having aecopd or asthma flares by even experienced allergists/ pulmonologists (myself included).   Of the three most common causes of  Sub-acute / recurrent or chronic cough, only one (GERD)  can actually contribute to/ trigger  the other two (asthma and post nasal drip syndrome)  and perpetuate the cylce of cough.  While not intuitively obvious, many patients with chronic low grade reflux do not cough until there is a primary insult that disturbs the protective epithelial barrier and exposes sensitive nerve endings.   This is typically post  viral but can due to PNDS and  either may apply here.   The point is that once this occurs, it is difficult to eliminate the cycle  using anything but a maximally effective acid suppression regimen at least in the short run, accompanied by an appropriate diet to address non acid GERD and control / eliminate the urge to clear the throat with hard rock candies than don't contain mint/ menthol           Each maintenance medication was reviewed in detail including emphasizing most importantly the difference between maintenance and prns and  under what circumstances the prns are to be triggered using an action plan format where appropriate.  Total time for H and P, chart review, counseling, teaching about use of hfa device/  directly observing portions of ambulatory 02 saturation study/  and generating customized AVS unique to this office visit / charting = 60 min

## 2020-02-15 ENCOUNTER — Other Ambulatory Visit: Payer: Self-pay | Admitting: Internal Medicine

## 2020-02-15 ENCOUNTER — Other Ambulatory Visit: Payer: Self-pay

## 2020-02-15 ENCOUNTER — Ambulatory Visit: Admission: RE | Admit: 2020-02-15 | Payer: Medicare Other | Source: Ambulatory Visit

## 2020-02-15 DIAGNOSIS — N63 Unspecified lump in unspecified breast: Secondary | ICD-10-CM

## 2020-02-15 NOTE — Progress Notes (Signed)
Spoke with pt and notified of results per Dr. Wert. Pt verbalized understanding and denied any questions. 

## 2020-03-08 ENCOUNTER — Other Ambulatory Visit: Payer: Self-pay

## 2020-03-08 ENCOUNTER — Other Ambulatory Visit: Payer: Self-pay | Admitting: Internal Medicine

## 2020-03-08 ENCOUNTER — Ambulatory Visit
Admission: RE | Admit: 2020-03-08 | Discharge: 2020-03-08 | Disposition: A | Discharge status: Home / Self Care | Payer: Medicare Other | Source: Ambulatory Visit | Attending: Internal Medicine | Admitting: Internal Medicine

## 2020-03-08 DIAGNOSIS — N63 Unspecified lump in unspecified breast: Secondary | ICD-10-CM

## 2020-03-16 ENCOUNTER — Ambulatory Visit: Payer: Medicare Other | Admitting: Physical Therapy

## 2020-03-23 ENCOUNTER — Ambulatory Visit: Payer: Medicare Other | Attending: Internal Medicine | Admitting: Physical Therapy

## 2020-03-23 ENCOUNTER — Other Ambulatory Visit: Payer: Self-pay

## 2020-03-23 ENCOUNTER — Encounter: Payer: Self-pay | Admitting: Physical Therapy

## 2020-03-23 DIAGNOSIS — R262 Difficulty in walking, not elsewhere classified: Secondary | ICD-10-CM | POA: Insufficient documentation

## 2020-03-23 DIAGNOSIS — M25572 Pain in left ankle and joints of left foot: Secondary | ICD-10-CM

## 2020-03-23 DIAGNOSIS — M25571 Pain in right ankle and joints of right foot: Secondary | ICD-10-CM | POA: Insufficient documentation

## 2020-03-23 NOTE — Therapy (Signed)
[facility] [address] [place], [place], [address] Phone: [phone]   Fax:  [phone]  Physical Therapy Evaluation  Patient Details  Name: [name] MRN: [id number] Date of Birth: 10-26-1960 Referring Provider (PT): Velna Ochs, MD    Encounter Date: 03/23/2020   PT End of Session - 03/23/20 1041    Visit Number 1    Date for PT Re-Evaluation 05/04/20    Authorization Type Medicare    Authorization Time Period 03/23/20 to 05/04/20    PT Start Time 0947   pt arrived late   PT Stop Time 1015    PT Time Calculation (min) 28 min    Activity Tolerance No increased pain;Patient tolerated treatment well    Behavior During Therapy [facility] for tasks assessed/performed           Past Medical History:  Diagnosis Date  . Anemia 2009    Baseline hemoglobin at 11, secondary to iron deficiency  . Arthritis   . Breast mass 02/2008    Noted on mammogram March 11, 2008 3 cm simple cyst at 9 to 10:00 position of the right breast as well as multiple other smaller simple cyst, 2.6 cm mass at the 10:00 position of the right breast also representing complex cyst,  . Deep vein thrombosis (DVT) (HCC)     history of multiple  DVTs in 1989, 1989, 2004, on chronic anticoagulation with Coumadin  . Diverticulosis    noted colonoscopy 2010  . Eczema   . Fibroid uterus  2001    status post total abdominal hysterectomy, right and left salpingo-oophorectomy , done by Dr. [name]  . Heart murmur    as a child  . Hypertension   . PE (pulmonary embolism)     history of multiple PEs in 1984, 1989, 2004-  electronic data reviewed in [place] and EMR and I was not able to find single CT angiogram that was positive for pulmonary embolus nor was I able to find Doppler studies that were positive for DVTs  . 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 ABDOMINAL HYSTERECTOMY  04/13/2010   done by Dr. [name]  . BREAST LUMPECTOMY WITH RADIOACTIVE SEED LOCALIZATION Right 07/09/2017   Procedure: RIGHT BREAST LUMPECTOMY WITH RADIOACTIVE SEED LOCALIZATION;  Surgeon: Erroll Luna, MD;  Location: [facility];  Service: General;  Laterality: Right;  . CHOLECYSTECTOMY     1997  . MASTECTOMY, PARTIAL  03/2006    left partial mastectomy secondary to fibrocystic disease intraluminal fibroadenoma performed March 28, 2006 done by Dr. [name]    There were no vitals filed for this visit.    Subjective Assessment - 03/23/20 0950    Subjective Pt states that she has had history of knee pain and bilateral ankle pain that has increased in intensity. She had an MRI about 3-4 weeks ago and was told she might have stress fractures in her ankles. She will be fitted for a boot on one or both this week.    Limitations Walking    How long can you walk comfortably? 30 minutes    Diagnostic tests MRI: "thinks she might have a fracture in both ankles"    Patient Stated Goals improve  ability to be on her feet    Currently in Pain? No/denies              [facility] PT Assessment - 03/23/20 0001      Assessment   Medical Diagnosis arthritis     Referring Provider (PT) Velna Ochs, MD     Onset Date/Surgical Date --   unknown   Next MD Visit tomorrow to get fit for a boot     Prior Therapy OPPT for her knees       Precautions   Precautions None      Restrictions   Other Position/Activity Restrictions potentially will be wearing boot       Balance Screen   Has the patient fallen in the past 6 months No    Has the patient had a decrease in activity level because of a fear of falling?  No    Is the patient reluctant to leave their home  because of a fear of falling?  No      Home Ec[employer] residence    Additional Comments 1 flight up stairs up to her house       Cognition   Overall Cognitive Status Within Functional Limits for tasks assessed      Posture/Postural Control   Posture Comments increased foot pronation Lt greater than Rt       ROM / Strength   AROM / PROM / Strength PROM;Strength      PROM   PROM Assessment Site Ankle   pain with all movements   Right/Left Ankle Right;Left    Right Ankle Dorsiflexion 0    Right Ankle Plantar Flexion 35    Left Ankle Dorsiflexion -5    Left Ankle Plantar Flexion 35      Strength   Overall Strength Comments Rt and Lt hip abduction 3/5 MMT    Strength Assessment Site Ankle    Right/Left Ankle Right;Left    Right Ankle Dorsiflexion 4/5    Right Ankle Plantar Flexion --   unable to complete standing single leg heel raise   Left Ankle Dorsiflexion 4/5    Left Ankle Plantar Flexion --   unable to complete standing single leg heel raise     Ambulation/Gait   Pre-Gait Activities ascends and descends steps with handrails, sideways, Rt step to pattern    Gait Comments minimal push off bilaterally       High Level Balance   High Level Balance Comments SLS: Rt 9 sec, Lt 7 sec                      Objective measurements completed on examination: See above findings.               PT Education - 03/23/20 1041    Education Details POC; DF stretch    Person(s) Educated Patient    Methods Explanation;Handout;Verbal cues    Comprehension Verbalized understanding;Returned demonstration            PT Short Term Goals - 03/23/20 1053      PT SHORT TERM GOAL #1   Title Pt will demo consistency and independence with her initial HEP to improve ankle ROM and LE strength.    Time 3    Period Weeks    Status New    Target Date 04/13/20             PT Long Term Goals - 03/23/20 1054  PT LONG TERM GOAL #1    Title Pt will have greater than 5 deg of active ankle DF Lt and Rt with the knee extended to improve her gait mechanics.    Time 6    Period Weeks    Status New    Target Date 05/04/20      PT LONG TERM GOAL #2   Title Pt will have improved ankle proprioception evident by her ability to complete SLS for atleast 10 sec, 2/3 trials, on Lt and Rt.    Time 6    Period Weeks    Status New      PT LONG TERM GOAL #3   Title Pt will have atleast 4/5 MMT strength of the Lt and Rt hip abductors which will improve LE efficiceny with stairs and other daily activity.    Time 6    Period Weeks    Status New      PT LONG TERM GOAL #4   Title Pt will be able to ascend and descend 4 steps with or without handrails and reciprocal pattern, 2/3 trials, which will improve her ability to use her home stairs throughout the day.    Time 6    Period Weeks    Status New      PT LONG TERM GOAL #5   Title Pt will be able to complete atleast 15 single leg heel raises on each LE.    Time 6    Period Weeks    Status New                  Plan - 03/23/20 1043    Clinical Impression Statement Pt is a 59 y.o F referred to OPPT with complaints of bilateral ankle pain. She was unable to recall when her pain started, but she notes a long history of knee pain. Pt has limited ankle dorsiflexion ROM bilaterally, poor ankle plantarflexion strength and hip strength, as well as impaired proprioception. She currently is limited to walking no more than 30 minutes at a time and steps sideways when using the stairs secondary to knee/ankle pain. She would benefit from skilled PT to address her limitations in ankle ROM, LE strength and proprioception as well as educate on proper/safe activity modifications to improve her independence with daily activity.    Personal Factors and Comorbidities Age;Fitness    Examination-Activity Limitations Stairs;Locomotion Level    Stability/Clinical Decision Making Stable/Uncomplicated      Clinical Decision Making Low    Rehab Potential Good    PT Frequency 2x / week    PT Duration 6 weeks    PT Treatment/Interventions ADLs/Self Care Home Management;Aquatic Therapy;Cryotherapy;Manual techniques;Dry needling;Joint Manipulations;Taping;Patient/family education;Therapeutic activities;Therapeutic exercise;Balance training;Neuromuscular re-education;Stair training;Gait training    PT Next Visit Plan f/u on HEP; progression of ankle strength 4-way band; ankle proprioception; hip abductor strength    PT Home Exercise Plan [email or web address]    Consulted and Agree with Plan of Care Patient           Patient will benefit from skilled therapeutic intervention in order to improve the following deficits and impairments:  Abnormal gait, Decreased activity tolerance, Decreased balance, Difficulty walking, Hypomobility, Decreased strength, Decreased range of motion, Decreased endurance, Pain, Improper body mechanics  Visit Diagnosis: Pain in left ankle and joints of left foot  Pain in right ankle and joints of right foot  Difficulty in walking, not elsewhere classified     Problem List Patient Active Problem List   Diagnosis Date  Noted  . DOE (dyspnea on exertion) 02/04/2020  . Shoulder impingement syndrome, right 02/04/2020  . COVID-19 11/27/2019  . Eye pain, right 05/16/2018  . Depression 05/16/2018  . Pale stool 05/16/2018  . Neuropathy 01/28/2018  . 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 Dysphonia 12/19/2017  . Left hip pain 09/06/2017  . Obesity, morbid ([place]) 09/28/2016  . Chronic pain syndrome 05/18/2016  . Hyperlipidemia 01/27/2014  . Headache(784.0) 09/29/2013  . Bilateral knee pain 05/25/2013  . Metatarsalgia of both feet 05/25/2013  . Eczema 05/12/2013  . Insomnia 05/12/2013  . Diverticulosis 04/18/2012  . Ovarian cyst, left 04/16/2012  . Breast cyst 10/02/2011  . Arthritis 02/07/2011  . Healthcare maintenance 02/07/2011  . Arthralgia 04/05/2009  . Upper airway  cough syndrome 12/03/2006  . Essential hypertension 05/29/2006  . 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    1:32 PM,03/23/20 Sherol Dade PT, DPT [facility] [address] [place], [place], [address] Phone: [phone]   Fax:  [phone]  Name: [name] MRN: [id number] Date of Birth: 04-16-1961

## 2020-03-30 ENCOUNTER — Ambulatory Visit: Payer: Medicare Other | Admitting: Physical Therapy

## 2020-03-30 ENCOUNTER — Encounter: Payer: Self-pay | Admitting: Physical Therapy

## 2020-03-30 ENCOUNTER — Other Ambulatory Visit: Payer: Self-pay

## 2020-03-30 DIAGNOSIS — M25571 Pain in right ankle and joints of right foot: Secondary | ICD-10-CM

## 2020-03-30 DIAGNOSIS — M25572 Pain in left ankle and joints of left foot: Secondary | ICD-10-CM

## 2020-03-30 DIAGNOSIS — R262 Difficulty in walking, not elsewhere classified: Secondary | ICD-10-CM

## 2020-03-30 NOTE — Therapy (Signed)
[facility] [address] [place], [place], [address] Phone: [phone]   Fax:  [phone]  Physical Therapy Treatment  Patient Details  Name: [name] MRN: [id number] Date of Birth: 1961/06/13 Referring Provider (PT): Velna Ochs, MD    Encounter Date: 03/30/2020   PT End of Session - 03/30/20 1200    Visit Number 2    Date for PT Re-Evaluation 05/04/20    Authorization Type Medicare    Authorization Time Period 03/23/20 to 05/04/20    PT Start Time 0938   pt arrived late   PT Stop Time 1012    PT Time Calculation (min) 34 min    Activity Tolerance No increased pain;Patient tolerated treatment well    Behavior During Therapy [name] for tasks assessed/performed           Past Medical History:  Diagnosis Date  . Anemia 2009    Baseline hemoglobin at 11, secondary to iron deficiency  . Arthritis   . Breast mass 02/2008    Noted on mammogram March 11, 2008 3 cm simple cyst at 9 to 10:00 position of the right breast as well as multiple other smaller simple cyst, 2.6 cm mass at the 10:00 position of the right breast also representing complex cyst,  . Deep vein thrombosis (DVT) (HCC)     history of multiple  DVTs in 1989, 1989, 2004, on chronic anticoagulation with Coumadin  . Diverticulosis    noted colonoscopy 2010  . Eczema   . Fibroid uterus  2001    status post total abdominal hysterectomy, right and left salpingo-oophorectomy , done by Dr. [name]  . Heart murmur    as a child  . Hypertension   . PE (pulmonary embolism)     history of multiple PEs in 1984, 1989, 2004-  electronic data reviewed in [place] and EMR and I was not able to find single CT angiogram that was positive for pulmonary embolus nor was I able to find Doppler studies that were positive for DVTs  . 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 ABDOMINAL HYSTERECTOMY  04/13/2010   done by Dr. [name]  . BREAST LUMPECTOMY WITH RADIOACTIVE SEED LOCALIZATION Right 07/09/2017   Procedure: RIGHT BREAST LUMPECTOMY WITH RADIOACTIVE SEED LOCALIZATION;  Surgeon: Erroll Luna, MD;  Location: [facility];  Service: General;  Laterality: Right;  . CHOLECYSTECTOMY     1997  . MASTECTOMY, PARTIAL  03/2006    left partial mastectomy secondary to fibrocystic disease intraluminal fibroadenoma performed March 28, 2006 done by Dr. [name]    There were no vitals filed for this visit.   Subjective Assessment - 03/30/20 0940    Subjective Pt states that her appointment with the doctor went well. She was told she didn't have fractures in her ankles. She has no pain currently.    Limitations Walking    How long can you walk comfortably? 30 minutes    Diagnostic tests MRI: "thinks she might have a fracture in both ankles"    Patient Stated Goals improve ability to be on her feet    Currently in Pain? No/denies  [facility] PT Treatment/Exercise - 03/30/20 0001      Exercises   Exercises Ankle;Knee/Hip      Knee/Hip Exercises: Standing   Hip Abduction Stengthening;Both;2 sets;10 reps    Abduction Limitations yellow TB around feet     Forward Step Up Both;1 set;10 reps;Hand Hold: 0;Step Height: 4"    Forward Step Up Limitations PT cuing to avoid knee valgus deviation      Ankle Exercises: Stretches   Gastroc Stretch 3 reps;20 seconds    Gastroc Stretch Limitations on slant board       Ankle Exercises: Seated   Other Seated Ankle Exercises ankle 4-way x20 reps green TB                  PT Education - 03/30/20 1200    Education Details updated HEP    Person(s) Educated  Patient    Methods Explanation    Comprehension Verbalized understanding            PT Short Term Goals - 03/23/20 1053      PT SHORT TERM GOAL #1   Title Pt will demo consistency and independence with her initial HEP to improve ankle ROM and LE strength.    Time 3    Period Weeks    Status New    Target Date 04/13/20             PT Long Term Goals - 03/23/20 1054      PT LONG TERM GOAL #1   Title Pt will have greater than 5 deg of active ankle DF Lt and Rt with the knee extended to improve her gait mechanics.    Time 6    Period Weeks    Status New    Target Date 05/04/20      PT LONG TERM GOAL #2   Title Pt will have improved ankle proprioception evident by her ability to complete SLS for atleast 10 sec, 2/3 trials, on Lt and Rt.    Time 6    Period Weeks    Status New      PT LONG TERM GOAL #3   Title Pt will have atleast 4/5 MMT strength of the Lt and Rt hip abductors which will improve LE efficiceny with stairs and other daily activity.    Time 6    Period Weeks    Status New      PT LONG TERM GOAL #4   Title Pt will be able to ascend and descend 4 steps with or without handrails and reciprocal pattern, 2/3 trials, which will improve her ability to use her home stairs throughout the day.    Time 6    Period Weeks    Status New      PT LONG TERM GOAL #5   Title Pt will be able to complete atleast 15 single leg heel raises on each LE.    Time 6    Period Weeks    Status New                 Plan - 03/30/20 1201    Clinical Impression Statement Pt arrives clarifying that she does not have ankle fractures, per her referring physician. Session focused on therex to increase ankle strength. Pt was able to demonstrate good understanding of this and other exercises completed during her session. Pt completed forward step up on 4 inch box without increase in pain, but she required PT cuing to improve knee valgus control.  Personal Factors and Comorbidities  Age;Fitness    Examination-Activity Limitations Stairs;Locomotion Level    Stability/Clinical Decision Making Stable/Uncomplicated    Rehab Potential Good    PT Frequency 2x / week    PT Duration 6 weeks    PT Treatment/Interventions ADLs/Self Care Home Management;Aquatic Therapy;Cryotherapy;Manual techniques;Dry needling;Joint Manipulations;Taping;Patient/family education;Therapeutic activities;Therapeutic exercise;Balance training;Neuromuscular re-education;Stair training;Gait training    PT Next Visit Plan f/u on HEP; progression of ankle strength 4-way band; ankle proprioception; hip abductor strength    PT Home Exercise Plan [email or web address]    Consulted and Agree with Plan of Care Patient           Patient will benefit from skilled therapeutic intervention in order to improve the following deficits and impairments:  Abnormal gait, Decreased activity tolerance, Decreased balance, Difficulty walking, Hypomobility, Decreased strength, Decreased range of motion, Decreased endurance, Pain, Improper body mechanics  Visit Diagnosis: Pain in left ankle and joints of left foot  Pain in right ankle and joints of right foot  Difficulty in walking, not elsewhere classified     Problem List Patient Active Problem List   Diagnosis Date Noted  . DOE (dyspnea on exertion) 02/04/2020  . Shoulder impingement syndrome, right 02/04/2020  . COVID-19 11/27/2019  . Eye pain, right 05/16/2018  . Depression 05/16/2018  . Pale stool 05/16/2018  . Neuropathy 01/28/2018  . 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 Dysphonia 12/19/2017  . Left hip pain 09/06/2017  . Obesity, morbid ([place]) 09/28/2016  . Chronic pain syndrome 05/18/2016  . Hyperlipidemia 01/27/2014  . Headache(784.0) 09/29/2013  . Bilateral knee pain 05/25/2013  . Metatarsalgia of both feet 05/25/2013  . Eczema 05/12/2013  . Insomnia 05/12/2013  . Diverticulosis 04/18/2012  . Ovarian cyst, left 04/16/2012  . Breast cyst 10/02/2011  .  Arthritis 02/07/2011  . Healthcare maintenance 02/07/2011  . Arthralgia 04/05/2009  . Upper airway cough syndrome 12/03/2006  . Essential hypertension 05/29/2006  . 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   1:19 PM,03/30/20 Sherol Dade PT, DPT [place] [facility] [address], [place], [place], [address] Phone: [phone]   Fax:  [phone]  Name: MADISYNN PLAIR MRN: [id number] Date of Birth: [birth year]

## 2020-04-06 ENCOUNTER — Encounter: Payer: Medicare Other | Admitting: Physical Therapy

## 2020-04-13 ENCOUNTER — Encounter: Payer: Self-pay | Admitting: Physical Therapy

## 2020-04-13 ENCOUNTER — Ambulatory Visit: Payer: Medicare Other | Admitting: Physical Therapy

## 2020-04-13 ENCOUNTER — Other Ambulatory Visit: Payer: Self-pay

## 2020-04-13 DIAGNOSIS — R262 Difficulty in walking, not elsewhere classified: Secondary | ICD-10-CM

## 2020-04-13 DIAGNOSIS — M25571 Pain in right ankle and joints of right foot: Secondary | ICD-10-CM

## 2020-04-13 DIAGNOSIS — M25572 Pain in left ankle and joints of left foot: Secondary | ICD-10-CM

## 2020-04-13 NOTE — Therapy (Signed)
[facility] [address] [place], [place], [address] Phone: [phone]   Fax:  [phone]  Physical Therapy Treatment  Patient Details  Name: [name] MRN: [id number] Date of Birth: [birth year] Referring Provider (PT): Velna Ochs, MD    Encounter Date: 04/13/2020   PT End of Session - 04/13/20 1156    Visit Number 3    Date for PT Re-Evaluation 05/04/20    Authorization Type Medicare    Authorization Time Period 03/23/20 to 05/04/20    PT Start Time 1155   10 min late   PT Stop Time 1229    PT Time Calculation (min) 34 min    Activity Tolerance Patient tolerated treatment well    Behavior During Therapy [facility] for tasks assessed/performed           Past Medical History:  Diagnosis Date  . Anemia 2009    Baseline hemoglobin at 11, secondary to iron deficiency  . Arthritis   . Breast mass 02/2008    Noted on mammogram March 11, 2008 3 cm simple cyst at 9 to 10:00 position of the right breast as well as multiple other smaller simple cyst, 2.6 cm mass at the 10:00 position of the right breast also representing complex cyst,  . Deep vein thrombosis (DVT) (HCC)     history of multiple  DVTs in 1989, 1989, 2004, on chronic anticoagulation with Coumadin  . Diverticulosis    noted colonoscopy 2010  . Eczema   . Fibroid uterus  2001    status post total abdominal hysterectomy, right and left salpingo-oophorectomy , done by Dr. [name]  . Heart murmur    as a child  . Hypertension   . PE (pulmonary embolism)     history of multiple PEs in 1984, 1989, 2004-  electronic data reviewed in [place] and EMR and I was not able to find single CT angiogram that was positive for pulmonary embolus nor was I able to find Doppler studies that were positive for DVTs  . 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 ABDOMINAL HYSTERECTOMY  04/13/2010   done by Dr. [name]  . BREAST LUMPECTOMY WITH RADIOACTIVE SEED LOCALIZATION Right 07/09/2017   Procedure: RIGHT BREAST LUMPECTOMY WITH RADIOACTIVE SEED LOCALIZATION;  Surgeon: Erroll Luna, MD;  Location: [facility];  Service: General;  Laterality: Right;  . CHOLECYSTECTOMY     1997  . MASTECTOMY, PARTIAL  03/2006    left partial mastectomy secondary to fibrocystic disease intraluminal fibroadenoma performed March 28, 2006 done by Dr. [name]    There were no vitals filed for this visit.   Subjective Assessment - 04/13/20 1157    Subjective "A little pain today."    Currently in Pain? Yes    Pain Score 5     Pain Location Ankle    Pain Orientation Right;Left    Pain Descriptors / Indicators Sore    Aggravating Factors  Constant    Pain Relieving Factors Not much    Multiple Pain Sites No                             [facility] Adult PT Treatment/Exercise -  04/13/20 0001      Self-Care   Self-Care Other Self-Care Comments    Other Self-Care Comments  standing more neutral through hip to ankle. Verbal education on less ext rotation through hip and putting more weight into her great toe side. Pt could return demo but dificult to hold.       Knee/Hip Exercises: Standing   Other Standing Knee Exercises Sidd stepping green loop along counter top 2x side stepping      Ankle Exercises: Seated   Other Seated Ankle Exercises ankle 4-way x20 reps green TB   Good return demo     Ankle Exercises: Stretches   Gastroc Stretch 3 reps;20 seconds    Gastroc Stretch Limitations on slant board       Ankle Exercises: Standing   SLS modified toe touch on opposite LE; 2x 10 sec Bil on black pad     Other Standing Ankle Exercises standing on black pad in best  neutral foot as possible 10 sec holds 3x                     PT Short Term Goals - 03/23/20 1053      PT SHORT TERM GOAL #1   Title Pt will demo consistency and independence with her initial HEP to improve ankle ROM and LE strength.    Time 3    Period Weeks    Status New    Target Date 04/13/20             PT Long Term Goals - 03/23/20 1054      PT LONG TERM GOAL #1   Title Pt will have greater than 5 deg of active ankle DF Lt and Rt with the knee extended to improve her gait mechanics.    Time 6    Period Weeks    Status New    Target Date 05/04/20      PT LONG TERM GOAL #2   Title Pt will have improved ankle proprioception evident by her ability to complete SLS for atleast 10 sec, 2/3 trials, on Lt and Rt.    Time 6    Period Weeks    Status New      PT LONG TERM GOAL #3   Title Pt will have atleast 4/5 MMT strength of the Lt and Rt hip abductors which will improve LE efficiceny with stairs and other daily activity.    Time 6    Period Weeks    Status New      PT LONG TERM GOAL #4   Title Pt will be able to ascend and descend 4 steps with or without handrails and reciprocal pattern, 2/3 trials, which will improve her ability to use her home stairs throughout the day.    Time 6    Period Weeks    Status New      PT LONG TERM GOAL #5   Title Pt will be able to complete atleast 15 single leg heel raises on each LE.    Time 6    Period Weeks    Status New                 Plan - 04/13/20 1233    Clinical Impression Statement Pt arrives 10 min late. Reports pain is baout hte same in her ankles. Pt compliant witht-band exercises but she tends to perform them in rigid hip ext rotation and end range foot/ankle supination. PTA educated pt on more  of an neutral alignment through the entire LE. Pt could partially correct and hold that position for 10 sec when standing on the black pad. No increase in ankle pain.    Personal Factors and Comorbidities  Age;Fitness    Examination-Activity Limitations Stairs;Locomotion Level    Stability/Clinical Decision Making Stable/Uncomplicated    Rehab Potential Good    PT Frequency 2x / week    PT Duration 6 weeks    PT Treatment/Interventions ADLs/Self Care Home Management;Aquatic Therapy;Cryotherapy;Manual techniques;Dry needling;Joint Manipulations;Taping;Patient/family education;Therapeutic activities;Therapeutic exercise;Balance training;Neuromuscular re-education;Stair training;Gait training    PT Next Visit Plan progress HEP next session. Keep pt aware of improving her LE alignment.    PT Home Exercise Plan [id number]    Consulted and Agree with Plan of Care Patient           Patient will benefit from skilled therapeutic intervention in order to improve the following deficits and impairments:  Abnormal gait, Decreased activity tolerance, Decreased balance, Difficulty walking, Hypomobility, Decreased strength, Decreased range of motion, Decreased endurance, Pain, Improper body mechanics  Visit Diagnosis: Pain in left ankle and joints of left foot  Pain in right ankle and joints of right foot  Difficulty in walking, not elsewhere classified     Problem List Patient Active Problem List   Diagnosis Date Noted  . DOE (dyspnea on exertion) 02/04/2020  . Shoulder impingement syndrome, right 02/04/2020  . COVID-19 11/27/2019  . Eye pain, right 05/16/2018  . Depression 05/16/2018  . Pale stool 05/16/2018  . Neuropathy 01/28/2018  . 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 Dysphonia 12/19/2017  . Left hip pain 09/06/2017  . Obesity, morbid ([place]) 09/28/2016  . Chronic pain syndrome 05/18/2016  . Hyperlipidemia 01/27/2014  . Headache(784.0) 09/29/2013  . Bilateral knee pain 05/25/2013  . Metatarsalgia of both feet 05/25/2013  . Eczema 05/12/2013  . Insomnia 05/12/2013  . Diverticulosis 04/18/2012  . Ovarian cyst, left 04/16/2012  . Breast cyst 10/02/2011  . Arthritis 02/07/2011  .  Healthcare maintenance 02/07/2011  . Arthralgia 04/05/2009  . Upper airway cough syndrome 12/03/2006  . Essential hypertension 05/29/2006  . 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    Leveta Wahab, PTA 04/13/2020, 12:39 PM  [facility] [address], [place], [place], [address] Phone: [phone]   Fax:  [phone]  Name: CHOSEN GARRON MRN: [id number] Date of Birth: 1961/02/04

## 2020-04-20 ENCOUNTER — Other Ambulatory Visit: Payer: Self-pay

## 2020-04-20 ENCOUNTER — Encounter: Payer: Self-pay | Admitting: Physical Therapy

## 2020-04-20 ENCOUNTER — Ambulatory Visit: Payer: Medicare Other | Attending: Internal Medicine | Admitting: Physical Therapy

## 2020-04-20 DIAGNOSIS — R262 Difficulty in walking, not elsewhere classified: Secondary | ICD-10-CM

## 2020-04-20 DIAGNOSIS — M25572 Pain in left ankle and joints of left foot: Secondary | ICD-10-CM | POA: Insufficient documentation

## 2020-04-20 DIAGNOSIS — M25571 Pain in right ankle and joints of right foot: Secondary | ICD-10-CM

## 2020-04-20 NOTE — Therapy (Signed)
[facility] [address] [place], [place], [address] Phone: [phone]   Fax:  [phone]  Physical Therapy Treatment  Patient Details  Name: [name] MRN: [id number] Date of Birth: April 03, 1961 Referring Provider (PT): Velna Ochs, MD    Encounter Date: 04/20/2020   PT End of Session - 04/20/20 1110    Visit Number 4    Date for PT Re-Evaluation 05/04/20    Authorization Type Medicare    Authorization Time Period 03/23/20 to 05/04/20    PT Start Time 1107    PT Stop Time 1145    PT Time Calculation (min) 38 min    Activity Tolerance Patient tolerated treatment well    Behavior During Therapy [facility] for tasks assessed/performed           Past Medical History:  Diagnosis Date  . Anemia 2009    Baseline hemoglobin at 11, secondary to iron deficiency  . Arthritis   . Breast mass 02/2008    Noted on mammogram March 11, 2008 3 cm simple cyst at 9 to 10:00 position of the right breast as well as multiple other smaller simple cyst, 2.6 cm mass at the 10:00 position of the right breast also representing complex cyst,  . Deep vein thrombosis (DVT) (HCC)     history of multiple  DVTs in 1989, 1989, 2004, on chronic anticoagulation with Coumadin  . Diverticulosis    noted colonoscopy 2010  . Eczema   . Fibroid uterus  2001    status post total abdominal hysterectomy, right and left salpingo-oophorectomy , done by Dr. [name]  . Heart murmur    as a child  . Hypertension   . PE (pulmonary embolism)     history of multiple PEs in 1984, 1989, 2004-  electronic data reviewed in [place] and EMR and I was not able to find single CT angiogram that was positive for pulmonary embolus nor was I able to find Doppler studies that were positive for DVTs  . 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 ABDOMINAL HYSTERECTOMY  04/13/2010   done by Dr. [name]  . BREAST LUMPECTOMY WITH RADIOACTIVE SEED LOCALIZATION Right 07/09/2017   Procedure: RIGHT BREAST LUMPECTOMY WITH RADIOACTIVE SEED LOCALIZATION;  Surgeon: Erroll Luna, MD;  Location: [facility];  Service: General;  Laterality: Right;  . CHOLECYSTECTOMY     1997  . MASTECTOMY, PARTIAL  03/2006    left partial mastectomy secondary to fibrocystic disease intraluminal fibroadenoma performed March 28, 2006 done by Dr. [name]    There were no vitals filed for this visit.   Subjective Assessment - 04/20/20 1111    Subjective I a no beter, today is just a bad day for my whole body. I hurt all over especially LT knee.    Currently in Pain? Yes   Al over   Pain Score 9     Pain Relieving Factors nothing    Multiple Pain Sites No              [facility] PT Assessment - 04/20/20 0001      PROM   Right Ankle Dorsiflexion 0    Right Ankle Plantar Flexion 58    Left Ankle  Dorsiflexion -5    Left Ankle Plantar Flexion 60                         [facility] Adult PT Treatment/Exercise - 04/20/20 0001      Knee/Hip Exercises: Stretches   Gastroc Stretch Right;Left;Both;2 reps;20 seconds      Knee/Hip Exercises: Aerobic   Nustep L1 x 3 min       Ankle Exercises: Seated   Other Seated Ankle Exercises Rocker board ankle PF/DF 3 min      Ankle Exercises: Standing   Heel Raises Both;20 reps                    PT Short Term Goals - 03/23/20 1053      PT SHORT TERM GOAL #1   Title Pt will demo consistency and independence with her initial HEP to improve ankle ROM and LE strength.    Time 3    Period Weeks    Status New    Target Date 04/13/20             PT Long Term Goals - 03/23/20 1054      PT LONG TERM GOAL #1    Title Pt will have greater than 5 deg of active ankle DF Lt and Rt with the knee extended to improve her gait mechanics.    Time 6    Period Weeks    Status New    Target Date 05/04/20      PT LONG TERM GOAL #2   Title Pt will have improved ankle proprioception evident by her ability to complete SLS for atleast 10 sec, 2/3 trials, on Lt and Rt.    Time 6    Period Weeks    Status New      PT LONG TERM GOAL #3   Title Pt will have atleast 4/5 MMT strength of the Lt and Rt hip abductors which will improve LE efficiceny with stairs and other daily activity.    Time 6    Period Weeks    Status New      PT LONG TERM GOAL #4   Title Pt will be able to ascend and descend 4 steps with or without handrails and reciprocal pattern, 2/3 trials, which will improve her ability to use her home stairs throughout the day.    Time 6    Period Weeks    Status New      PT LONG TERM GOAL #5   Title Pt will be able to complete atleast 15 single leg heel raises on each LE.    Time 6    Period Weeks    Status New                 Plan - 04/20/20 1110    Clinical Impression Statement Pt presents with 'total body pain today. " Pt reporting doing HEP 1x day but not seeing much change. She only tolerated low level exercises today and verbally reported not wanting more HEP that she was pleased with her current level. pt's ankle ROM no change.    Personal Factors and Comorbidities Age;Fitness    Examination-Activity Limitations Stairs;Locomotion Level    Stability/Clinical Decision Making Stable/Uncomplicated    PT Frequency 2x / week    PT Duration 6 weeks    PT Treatment/Interventions ADLs/Self Care Home Management;Aquatic Therapy;Cryotherapy;Manual techniques;Dry needling;Joint Manipulations;Taping;Patient/family education;Therapeutic activities;Therapeutic exercise;Balance training;Neuromuscular re-education;Stair training;Gait training    PT  Next Visit Plan progress HEP next session. Keep pt  aware of improving her LE alignment.    PT Home Exercise Plan [email or web address]    Consulted and Agree with Plan of Care Patient           Patient will benefit from skilled therapeutic intervention in order to improve the following deficits and impairments:  Abnormal gait, Decreased activity tolerance, Decreased balance, Difficulty walking, Hypomobility, Decreased strength, Decreased range of motion, Decreased endurance, Pain, Improper body mechanics  Visit Diagnosis: Pain in left ankle and joints of left foot  Pain in right ankle and joints of right foot  Difficulty in walking, not elsewhere classified     Problem List Patient Active Problem List   Diagnosis Date Noted  . DOE (dyspnea on exertion) 02/04/2020  . Shoulder impingement syndrome, right 02/04/2020  . COVID-19 11/27/2019  . Eye pain, right 05/16/2018  . Depression 05/16/2018  . Pale stool 05/16/2018  . Neuropathy 01/28/2018  . 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 Dysphonia 12/19/2017  . Left hip pain 09/06/2017  . Obesity, morbid ([place]) 09/28/2016  . Chronic pain syndrome 05/18/2016  . Hyperlipidemia 01/27/2014  . Headache(784.0) 09/29/2013  . Bilateral knee pain 05/25/2013  . Metatarsalgia of both feet 05/25/2013  . Eczema 05/12/2013  . Insomnia 05/12/2013  . Diverticulosis 04/18/2012  . Ovarian cyst, left 04/16/2012  . Breast cyst 10/02/2011  . Arthritis 02/07/2011  . Healthcare maintenance 02/07/2011  . Arthralgia 04/05/2009  . Upper airway cough syndrome 12/03/2006  . Essential hypertension 05/29/2006  . 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    Netha Dafoe, PTA 04/20/2020, 1:09 PM  [facility] [address] [place], [place], [address] Phone: [phone]   Fax:  [phone]  Name: [name] MRN: [id number] Date of Birth: October 10, 1960

## 2020-04-27 ENCOUNTER — Encounter: Payer: Medicare Other | Admitting: Physical Therapy

## 2020-05-04 ENCOUNTER — Ambulatory Visit: Payer: Medicare Other | Admitting: Physical Therapy

## 2020-05-04 ENCOUNTER — Other Ambulatory Visit: Payer: Self-pay

## 2020-05-04 ENCOUNTER — Encounter: Payer: Self-pay | Admitting: Physical Therapy

## 2020-05-04 DIAGNOSIS — M25572 Pain in left ankle and joints of left foot: Secondary | ICD-10-CM | POA: Diagnosis not present

## 2020-05-04 DIAGNOSIS — R262 Difficulty in walking, not elsewhere classified: Secondary | ICD-10-CM

## 2020-05-04 DIAGNOSIS — M25571 Pain in right ankle and joints of right foot: Secondary | ICD-10-CM

## 2020-05-04 NOTE — Therapy (Signed)
[facility] [address], [place], [place], [address] Phone: [phone]   Fax:  [phone]  Physical Therapy Treatment/Discharge  Patient Details  Name: [name] MRN: [id number] Date of Birth: 08-Nov-1960 Referring Provider (PT): Velna Ochs, MD    Encounter Date: 05/04/2020   PT End of Session - 05/04/20 1236    Visit Number 5    Date for PT Re-Evaluation 05/04/20    Authorization Type Medicare    Authorization Time Period 03/23/20 to 05/04/20    PT Start Time 1114    PT Stop Time 1145    PT Time Calculation (min) 31 min    Activity Tolerance Patient tolerated treatment well    Behavior During Therapy [facility] for tasks assessed/performed           Past Medical History:  Diagnosis Date  . Anemia 2009    Baseline hemoglobin at 11, secondary to iron deficiency  . Arthritis   . Breast mass 02/2008    Noted on mammogram March 11, 2008 3 cm simple cyst at 9 to 10:00 position of the right breast as well as multiple other smaller simple cyst, 2.6 cm mass at the 10:00 position of the right breast also representing complex cyst,  . Deep vein thrombosis (DVT) (HCC)     history of multiple  DVTs in 1989, 1989, 2004, on chronic anticoagulation with Coumadin  . Diverticulosis    noted colonoscopy 2010  . Eczema   . Fibroid uterus  2001    status post total abdominal hysterectomy, right and left salpingo-oophorectomy , done by Dr. [name]  . Heart murmur    as a child  . Hypertension   . PE (pulmonary embolism)     history of multiple PEs in 1984, 1989, 2004-  electronic data reviewed in [place] and EMR and I was not able to find single CT angiogram that was positive for pulmonary embolus nor was I able to find Doppler studies that were positive for DVTs  . 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 ABDOMINAL HYSTERECTOMY  04/13/2010   done by Dr. [name]  . BREAST LUMPECTOMY WITH RADIOACTIVE SEED LOCALIZATION Right 07/09/2017   Procedure: RIGHT BREAST LUMPECTOMY WITH RADIOACTIVE SEED LOCALIZATION;  Surgeon: Erroll Luna, MD;  Location: [place];  Service: General;  Laterality: Right;  . CHOLECYSTECTOMY     1997  . MASTECTOMY, PARTIAL  03/2006    left partial mastectomy secondary to fibrocystic disease intraluminal fibroadenoma performed March 28, 2006 done by Dr. [name]    There were no vitals filed for this visit.   Subjective Assessment - 05/04/20 1113    Subjective Pt states that things have been going ok. "I won't complain". She states she is trying to do some of her exercises every day.    Currently in Pain? No/denies              [facility] PT Assessment - 05/04/20 0001      Assessment   Medical Diagnosis arthritis     Referring Provider (PT) Velna Ochs, MD     Onset Date/Surgical Date --   unknown   Next MD Visit plans to schedule this     Prior  Therapy OPPT for her knees       Precautions   Precautions None      Restrictions   Other Position/Activity Restrictions none       Balance Screen   Has the patient fallen in the past 6 months No    Has the patient had a decrease in activity level because of a fear of falling?  No    Is the patient reluctant to leave their home because of a fear of falling?  No      Home Tourist information centre manager residence    Additional Comments 1 flight up stairs up to her house       Cognition   Overall Cognitive Status Within Functional Limits for tasks assessed      Posture/Postural Control   Posture Comments increased foot pronation Lt greater than Rt       PROM   Right Ankle Dorsiflexion 0    Right Ankle Plantar  Flexion 60    Left Ankle Dorsiflexion 0    Left Ankle Plantar Flexion 35      Strength   Overall Strength Comments Rt and Lt hip abduction 3/5 MMT    Right Ankle Dorsiflexion 4/5    Right Ankle Plantar Flexion --   x10 unable to complete through full range    Left Ankle Dorsiflexion 4/5    Left Ankle Plantar Flexion --   x10 unable to complete through full range      Ambulation/Gait   Gait Comments minimal push off bilaterally , excessive foot eversion      High Level Balance   High Level Balance Comments SLS: Rt 10 sec, Lt 10 sec                         [facility] Adult PT Treatment/Exercise - 05/04/20 0001      Knee/Hip Exercises: Standing   Hip Abduction Stengthening;Right;Left;2 sets;10 reps;Knee straight    Abduction Limitations red TB around ankles       Ankle Exercises: Stretches   Gastroc Stretch 2 reps;30 seconds    Gastroc Stretch Limitations standing against wall- cuing to avoid foot eversion           bike: L1 partial revolutions for ROM x3 min        PT Education - 05/04/20 1235    Education Details technique with therex; updates to HEP; importance of HEP adherence    Person(s) Educated Patient    Methods Explanation;Verbal cues;Handout    Comprehension Verbalized understanding;Returned demonstration            PT Short Term Goals - 05/04/20 1123      PT SHORT TERM GOAL #1   Title Pt will demo consistency and independence with her initial HEP to improve ankle ROM and LE strength.    Time 3    Period Weeks    Status Achieved    Target Date 04/13/20             PT Long Term Goals - 05/04/20 1124      PT LONG TERM GOAL #1   Title Pt will have greater than 5 deg of active ankle DF Lt and Rt with the knee extended to improve her gait mechanics.    Time 6    Period Weeks    Status Not Met      PT LONG TERM GOAL #2   Title Pt will have improved ankle proprioception  evident by her ability to complete SLS for atleast 10 sec, 2/3  trials, on Lt and Rt.    Time 6    Period Weeks    Status Achieved      PT LONG TERM GOAL #3   Title Pt will have atleast 4/5 MMT strength of the Lt and Rt hip abductors which will improve LE efficiceny with stairs and other daily activity.    Time 6    Period Weeks    Status Not Met      PT LONG TERM GOAL #4   Title Pt will be able to ascend and descend 4 steps with or without handrails and reciprocal pattern, 2/3 trials, which will improve her ability to use her home stairs throughout the day.    Time 6    Period Weeks    Status Achieved      PT LONG TERM GOAL #5   Title Pt will be able to complete atleast 15 single leg heel raises on each LE.    Baseline unable through full range    Time 6    Period Weeks    Status Not Met                 Plan - 05/04/20 1237    Clinical Impression Statement Pt was discharged this visit. Her single leg balance has improved to 10 seconds bilaterally and she is able to use the stairs with reciprocal pattern after cued by the PT. Overall, she has made minimal progress since beginning PT. Pt states she has been doing some of her HEP every day, but she is unable to demonstrate understanding of HEP components and has made no progress in the targeted areas. Pt has been routinely late to her appointments and feels that her pain secondary to arthritis will likely not improve. She will plan to return to her referring physician as feels necessary.    Personal Factors and Comorbidities Age;Fitness    Examination-Activity Limitations Stairs;Locomotion Level    Stability/Clinical Decision Making Stable/Uncomplicated    PT Frequency 2x / week    PT Duration 6 weeks    PT Treatment/Interventions ADLs/Self Care Home Management;Aquatic Therapy;Cryotherapy;Manual techniques;Dry needling;Joint Manipulations;Taping;Patient/family education;Therapeutic activities;Therapeutic exercise;Balance training;Neuromuscular re-education;Stair training;Gait training    PT  Next Visit Plan d/c    PT Home Exercise Plan [id number]    Consulted and Agree with Plan of Care Patient           Patient will benefit from skilled therapeutic intervention in order to improve the following deficits and impairments:  Abnormal gait, Decreased activity tolerance, Decreased balance, Difficulty walking, Hypomobility, Decreased strength, Decreased range of motion, Decreased endurance, Pain, Improper body mechanics  Visit Diagnosis: Pain in left ankle and joints of left foot  Pain in right ankle and joints of right foot  Difficulty in walking, not elsewhere classified     Problem List Patient Active Problem List   Diagnosis Date Noted  . DOE (dyspnea on exertion) 02/04/2020  . Shoulder impingement syndrome, right 02/04/2020  . COVID-19 11/27/2019  . Eye pain, right 05/16/2018  . Depression 05/16/2018  . Pale stool 05/16/2018  . Neuropathy 01/28/2018  . 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 Dysphonia 12/19/2017  . Left hip pain 09/06/2017  . Obesity, morbid (HCC) 09/28/2016  . Chronic pain syndrome 05/18/2016  . Hyperlipidemia 01/27/2014  . Headache(784.0) 09/29/2013  . Bilateral knee pain 05/25/2013  . Metatarsalgia of both feet 05/25/2013  . Eczema 05/12/2013  . Insomnia  05/12/2013  . Diverticulosis 04/18/2012  . Ovarian cyst, left 04/16/2012  . Breast cyst 10/02/2011  . Arthritis 02/07/2011  . Healthcare maintenance 02/07/2011  . Arthralgia 04/05/2009  . Upper airway cough syndrome 12/03/2006  . Essential hypertension 05/29/2006  . 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    12:43 PM,05/04/20 Sherol Dade PT, DPT [place] [facility] [address] [place], [place], [address] Phone: [phone]   Fax:  [phone]  Name: [name] MRN: [id number] Date of Birth: 27-Jul-1961

## 2020-05-05 ENCOUNTER — Encounter: Payer: Self-pay | Admitting: Student

## 2020-05-12 ENCOUNTER — Ambulatory Visit (INDEPENDENT_AMBULATORY_CARE_PROVIDER_SITE_OTHER): Payer: Medicare Other | Admitting: Internal Medicine

## 2020-05-12 ENCOUNTER — Other Ambulatory Visit: Payer: Self-pay

## 2020-05-12 ENCOUNTER — Encounter: Payer: Self-pay | Admitting: Internal Medicine

## 2020-05-12 DIAGNOSIS — G4700 Insomnia, unspecified: Secondary | ICD-10-CM

## 2020-05-12 MED ORDER — TRAZODONE HCL 50 MG PO TABS: 50.0000 mg | ORAL_TABLET | Freq: Every day | ORAL | 0 refills | Status: DC

## 2020-05-12 NOTE — Assessment & Plan Note (Signed)
Reports difficulty sleeping for the past month and a half, to the point of having headaches. Patient states that she has tried melatonin, benadryl, flexeril, and multiple sleep aids with no effect. She goes to bed nightly at 10:00 pm. She states that the last time she remembers seeing is around 1:00 am. She will sleep for 30 minutes and wake up and cycle in 20-30 minute intervals between sleep/wake. She states that the tv is on, but the sound is turned off. She denies showers, caffeine beverage consumption before bedtime. She does urinate 2 times nightly, which is usual for her.   Given her history of depression, will start trazodone. She additionally has not seen her therapist since the beginning of the [place] pandemic, but would like to reestablish care.  - Counseled on sleep hygiene - Trazodone 50 mg nightly - referral back to counselor

## 2020-05-12 NOTE — Progress Notes (Signed)
  [facility] Internal [facility] Residency Telephone Encounter Continuity Care Appointment  HPI:   This telephone encounter was created for Ms. [name] on 05/12/2020 for the following purpose/cc difficulty sleeping.   Past Medical History:  Past Medical History:  Diagnosis Date  . Anemia 2009    Baseline hemoglobin at 11, secondary to iron deficiency  . Arthritis   . Breast mass 02/2008    Noted on mammogram March 11, 2008 3 cm simple cyst at 9 to 10:00 position of the right breast as well as multiple other smaller simple cyst, 2.6 cm mass at the 10:00 position of the right breast also representing complex cyst,  . Deep vein thrombosis (DVT) (HCC)     history of multiple  DVTs in 1989, 1989, 2004, on chronic anticoagulation with Coumadin  . Diverticulosis    noted colonoscopy 2010  . Eczema   . Fibroid uterus  2001    status post total abdominal hysterectomy, right and left salpingo-oophorectomy , done by Dr. [name]  . Heart murmur    as a child  . Hypertension   . PE (pulmonary embolism)     history of multiple PEs in 1984, 1989, 2004-  electronic data reviewed in [place] and EMR and I was not able to find single CT angiogram that was positive for pulmonary embolus nor was I able to find Doppler studies that were positive for DVTs  . 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   As always, pt is advised that if symptoms worsen or new symptoms arise, they should go to an urgent care facility or to to ER for further evaluation.   Consent and Medical Decision Making:   Patient  discussed with Dr. Dareen Piano  This is a telephone encounter between [name] and Maudie Mercury on 05/12/2020 for Sleep difficulty . The visit was conducted with the patient located at home and Maudie Mercury at [facility]. The patient's identity was confirmed using their DOB and current address. The patient has consented to being evaluated through a telephone encounter and understands the associated risks (an examination cannot be done and the patient may need to come in for an appointment) / benefits (allows the patient to remain at home, decreasing exposure to coronavirus). I personally spent 10 minutes on medical discussion.

## 2020-05-13 ENCOUNTER — Telehealth: Payer: Self-pay | Admitting: Student

## 2020-05-13 NOTE — Telephone Encounter (Signed)
Pls contact pt regarding medicine [phone]

## 2020-05-13 NOTE — Telephone Encounter (Signed)
RTC, Pt states she had Telehealth visit w/ Dr. Gilford Rile yesterday for c/o insomnia.  She was prescribed Trazodone 50mg .  States she took one at 4pm and woke up at 5pm.  She took 2 more at 9pm and woke up at 11pm and hasn't been able to sleep since.  States "I am tired of spending money on medications that don't work".  States she has taken flexeril years ago and it worked.  Will send to Red team to advise. SChaplin, RN,BSN

## 2020-05-13 NOTE — Telephone Encounter (Signed)
Flexeril is not indicated for the treatment of Insomnia/sleep disorders, it is a muscle relaxer. She has only tried trazodone for one evening. We stressed the importance of sleep hygiene yesterday with adjunct medical therapy. Patient should continue to take the medication.

## 2020-05-13 NOTE — Telephone Encounter (Signed)
Patient called and notified of Dr. Jackson Latino instructions below.  Also reiterated the importance of taking prescribed medication as directed.  She verbalized understanding and ended call. SChaplin, RN,BSN

## 2020-05-19 NOTE — Progress Notes (Signed)
Internal Medicine Clinic Attending ? ?Case discussed with Dr. Winters  At the time of the visit.  We reviewed the resident?s history and exam and pertinent patient test results.  I agree with the assessment, diagnosis, and plan of care documented in the resident?s note.  ?

## 2020-06-03 ENCOUNTER — Other Ambulatory Visit: Payer: Self-pay | Admitting: Internal Medicine

## 2020-06-09 NOTE — Addendum Note (Signed)
Addended by: Hulan Fray on: 06/09/2020 02:55 PM   Modules accepted: Orders

## 2020-08-12 ENCOUNTER — Emergency Department (HOSPITAL_COMMUNITY)
Admission: EM | Admit: 2020-08-12 | Discharge: 2020-08-12 | Disposition: A | Discharge status: Home / Self Care | Payer: Medicare Other | Attending: Emergency Medicine | Admitting: Emergency Medicine

## 2020-08-12 ENCOUNTER — Encounter (HOSPITAL_COMMUNITY): Payer: Self-pay

## 2020-08-12 ENCOUNTER — Other Ambulatory Visit: Payer: Self-pay

## 2020-08-12 DIAGNOSIS — Z9012 Acquired absence of left breast and nipple: Secondary | ICD-10-CM | POA: Insufficient documentation

## 2020-08-12 DIAGNOSIS — I10 Essential (primary) hypertension: Secondary | ICD-10-CM | POA: Insufficient documentation

## 2020-08-12 DIAGNOSIS — Z20822 Contact with and (suspected) exposure to covid-19: Secondary | ICD-10-CM

## 2020-08-12 DIAGNOSIS — R5383 Other fatigue: Secondary | ICD-10-CM | POA: Diagnosis not present

## 2020-08-12 DIAGNOSIS — R519 Headache, unspecified: Secondary | ICD-10-CM | POA: Insufficient documentation

## 2020-08-12 DIAGNOSIS — Z79899 Other long term (current) drug therapy: Secondary | ICD-10-CM | POA: Diagnosis not present

## 2020-08-12 DIAGNOSIS — Z8616 Personal history of COVID-19: Secondary | ICD-10-CM | POA: Diagnosis not present

## 2020-08-12 LAB — RESP PANEL BY RT-PCR (FLU A&B, COVID) ARPGX2
Influenza A by PCR: NEGATIVE
Influenza B by PCR: NEGATIVE
SARS Coronavirus 2 by RT PCR: NEGATIVE

## 2020-08-12 NOTE — ED Provider Notes (Signed)
[place] DEPT Provider Note   CSN: [id number] Arrival date & time: 08/12/20  [id number]     History Chief Complaint  Patient presents with  . Covid Exposure    [name] is a 59 y.o. female who presents with a covid exposure. She states that she went to a Christmas party one week ago and one of the partygoers had COVID. Several other people tested positive for COVID as well. The patient had COVID earlier this year and she's been vaccinated in July. Over the past 2 days she's had a frontal headache and fatigue. She denies any other symptoms. She came today to be tested to make sure she doesn't have COVID again.  HPI     Past Medical History:  Diagnosis Date  . Anemia 2009    Baseline hemoglobin at 11, secondary to iron deficiency  . Arthritis   . Breast mass 02/2008    Noted on mammogram March 11, 2008 3 cm simple cyst at 9 to 10:00 position of the right breast as well as multiple other smaller simple cyst, 2.6 cm mass at the 10:00 position of the right breast also representing complex cyst,  . Deep vein thrombosis (DVT) (HCC)     history of multiple  DVTs in 1989, 1989, 2004, on chronic anticoagulation with Coumadin  . Diverticulosis    noted colonoscopy 2010  . Eczema   . Fibroid uterus  2001    status post total abdominal hysterectomy, right and left salpingo-oophorectomy , done by Dr. [name]  . Heart murmur    as a child  . Hypertension   . PE (pulmonary embolism)     history of multiple PEs in 1984, 1989, 2004-  electronic data reviewed in [place] and EMR and I was not able to find single CT angiogram that was positive for pulmonary embolus nor was I able to find Doppler studies that were positive for DVTs  . 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 DOE (dyspnea on exertion) 02/04/2020  . Shoulder impingement syndrome, right 02/04/2020  . COVID-19 11/27/2019  . Eye pain, right 05/16/2018  . Depression 05/16/2018  . Pale stool 05/16/2018  . Neuropathy 01/28/2018  . 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 Dysphonia 12/19/2017  . Left hip pain 09/06/2017  . Obesity, morbid ([place]) 09/28/2016  . Chronic pain syndrome 05/18/2016  . Hyperlipidemia 01/27/2014  . Headache(784.0) 09/29/2013  . Bilateral knee pain 05/25/2013  . Metatarsalgia of both feet 05/25/2013  . Eczema 05/12/2013  . Insomnia 05/12/2013  . Diverticulosis 04/18/2012  . Ovarian cyst, left 04/16/2012  . Breast cyst 10/02/2011  . Arthritis 02/07/2011  . Healthcare maintenance 02/07/2011  . Arthralgia 04/05/2009  . Upper airway cough syndrome 12/03/2006  . Essential hypertension 05/29/2006  . 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 ABDOMINAL HYSTERECTOMY  04/13/2010   done by Dr. [name]  . BREAST LUMPECTOMY WITH RADIOACTIVE SEED LOCALIZATION Right 07/09/2017   Procedure: RIGHT BREAST LUMPECTOMY WITH RADIOACTIVE SEED LOCALIZATION;  Surgeon: Erroll Luna, MD;  Location: [facility];  Service: General;  Laterality: Right;  . CHOLECYSTECTOMY     1997  . MASTECTOMY, PARTIAL  03/2006    left partial mastectomy secondary to fibrocystic disease intraluminal fibroadenoma performed March 28, 2006 done by Dr. [name]     OB History   No obstetric history on file.     Family History  Problem Relation Age of Onset  . Cancer Other   . Clotting disorder Other   . Gout Father   . Stroke Neg Hx     Social History   Tobacco Use  . Smoking status: Never Smoker  . Smokeless tobacco: Never Used  Substance Use Topics  . Alcohol use: No    Alcohol/week: 0.0 standard drinks    Comment: rare   . Drug use: No    Home Medications Prior to Admission medications   Medication Sig Start Date End Date Taking? Authorizing Provider  albuterol (VENTOLIN HFA) 108 (90 Base) MCG/ACT inhaler Inhale 1-2 puffs into the lungs every 6 (six) hours as needed for wheezing or shortness of breath. 11/17/19   Chundi, Verne Spurr, MD  amLODipine (NORVASC) 10 MG tablet Take 1 tablet (10 mg total) by mouth daily. 02/04/20   Lars Mage, MD  Multiple Vitamin (MULTIVITAMIN) capsule Take 1 capsule daily by mouth.    [provider]  traZODone (DESYREL) 50 MG tablet TAKE 1 TABLET BY MOUTH EVERYDAY AT BEDTIME 06/07/20   Iona Beard, MD    Allergies    Dilaudid [hydromorphone hcl]  Review of Systems   Review of Systems  Constitutional: Positive for fatigue. Negative for chills and fever.  HENT: Negative for congestion and sore throat.   Respiratory: Negative for cough and shortness of breath.   Neurological: Positive for headaches.    Physical Exam Updated Vital Signs BP (!) 160/82 (BP Location: Left Arm)   Pulse 62   Temp 98.1 F (36.7 C) (Oral)   Resp 18   Ht 5\' 11"  (1.803 m)   Wt 123.8 kg   SpO2 100%   BMI 38.08 kg/m   Physical Exam Vitals and nursing note reviewed.  Constitutional:      General: She is not in acute distress.    Appearance: Normal appearance. She is well-developed and well-nourished. She is not ill-appearing.  HENT:     Head: Normocephalic and atraumatic.     Right Ear: Tympanic membrane normal.     Left Ear: Tympanic membrane normal.     Nose: Nose normal.     Mouth/Throat:     Mouth: Mucous membranes are moist.  Eyes:     General: No scleral icterus.       Right eye: No discharge.        Left eye: No discharge.     Conjunctiva/sclera: Conjunctivae normal.     Pupils: Pupils are equal, round, and reactive to light.  Cardiovascular:     Rate and Rhythm: Normal rate and regular rhythm.  Pulmonary:     Effort: Pulmonary effort is normal. No respiratory  distress.     Breath sounds: Normal breath sounds.  Abdominal:     General: There is no distension.  Musculoskeletal:     Cervical back: Normal range of motion.  Skin:    General: Skin is warm and dry.  Neurological:     Mental Status: She is alert and oriented to person, place, and time.  Psychiatric:        Mood and Affect: Mood and affect normal.        Behavior: Behavior normal.     ED Results / Procedures / Treatments   Labs (all  labs ordered are listed, but only abnormal results are displayed) Labs Reviewed  RESP PANEL BY RT-PCR (FLU A&B, COVID) [id number]    EKG None  Radiology No results found.  Procedures Procedures (including critical care time)  Medications Ordered in ED Medications - No data to display  ED Course  I have reviewed the triage vital signs and the nursing notes.  Pertinent labs & imaging results that were available during my care of the patient were reviewed by me and considered in my medical decision making (see chart for details).  59 year old female presents with headache and fatigue for 2 days after a COVID exposure. COVID test here is negative. She is hypertensive but otherwise vitals are normal. There is the possibility this is a false negative however she has already had COVID and been vaccinated and her symptoms are atypical for COVID so I think this is less likely. She was advised to return if worsening  [name] was evaluated in Emergency Department on 08/12/2020 for the symptoms described in the history of present illness. She was evaluated in the context of the global COVID-19 pandemic, which necessitated consideration that the patient might be at risk for infection with the SARS-CoV-2 virus that causes COVID-19. Institutional protocols and algorithms that pertain to the evaluation of patients at risk for COVID-19 are in a state of rapid change based on information released by regulatory bodies including the CDC and federal and state  organizations. These policies and algorithms were followed during the patient's care in the ED.  MDM Rules/Calculators/A&P                           Final Clinical Impression(s) / ED Diagnoses Final diagnoses:  Close exposure to COVID-19 virus  Acute nonintractable headache, unspecified headache type    Rx / DC Orders ED Discharge Orders    None       Recardo Evangelist, PA-C 08/12/20 [place]    Tegeler, Gwenyth Allegra, MD 08/12/20 1529

## 2020-08-12 NOTE — ED Triage Notes (Signed)
Pt presents to ED from home. Attended a party Friday night and found out Thursday that a guest had been a known positive. Two guests have since tested positive. Pt rec'd 2nd vax 7/2. C/o fatigue and headache x2 days. Denies cough, respiratory issues. "Feels like somebody who has a little flu." Previously infected March 2021.

## 2020-09-09 ENCOUNTER — Other Ambulatory Visit: Payer: Medicare Other

## 2020-10-20 ENCOUNTER — Other Ambulatory Visit: Payer: Medicare Other

## 2020-11-15 ENCOUNTER — Ambulatory Visit
Admission: RE | Admit: 2020-11-15 | Discharge: 2020-11-15 | Disposition: A | Discharge status: Home / Self Care | Payer: Medicare Other | Source: Ambulatory Visit | Attending: Internal Medicine | Admitting: Internal Medicine

## 2020-11-15 ENCOUNTER — Other Ambulatory Visit: Payer: Self-pay

## 2020-11-15 ENCOUNTER — Other Ambulatory Visit: Payer: Self-pay | Admitting: Internal Medicine

## 2020-11-15 DIAGNOSIS — N63 Unspecified lump in unspecified breast: Secondary | ICD-10-CM

## 2021-01-05 ENCOUNTER — Encounter: Payer: Self-pay | Admitting: *Deleted

## 2021-01-05 NOTE — Progress Notes (Unsigned)

## 2021-01-09 ENCOUNTER — Encounter: Payer: Self-pay | Admitting: Internal Medicine

## 2021-01-09 NOTE — Progress Notes (Signed)
Things That May Be Affecting Your Health:  Alcohol  Hearing loss  Pain    Depression  Home Safety  Sexual Health   Diabetes x Lack of physical activity  Stress   Difficulty with daily activities  Loneliness  Tiredness   Drug use  Medicines  Tobacco use   Falls  Motor Vehicle Safety x Weight  x Food choices  Oral Health  Other    YOUR PERSONALIZED HEALTH PLAN : 1. Schedule your next subsequent Medicare Wellness visit in one year 2. Attend all of your regular appointments to address your medical issues 3. Complete the preventative screenings and services   Annual Wellness Visit   Medicare Covered Preventative Screenings and [place] Men and Women Who How Often Need? Date of Last Service Action  Abdominal Aortic Aneurysm Adults with AAA risk factors Once      Alcohol Misuse and Counseling All Adults Screening once a year if no alcohol misuse. Counseling up to 4 face to face sessions.     Bone Density Measurement  Adults at risk for osteoporosis Once every 2 yrs      Lipid Panel Z13.6 All adults without CV disease Once every 5 yrs x      Colorectal Cancer   Stool sample or  Colonoscopy All adults 39 and older   Once every year  Every 10 years x       Depression All Adults Once a year  Today   Diabetes Screening Blood glucose, post glucose load, or GTT Z13.1  All adults at risk  Pre-diabetics  Once per year  Twice per year x     Diabetes  Self-Management Training All adults Diabetics 10 hrs first year; 2 hours subsequent years. Requires Copay x    Glaucoma  Diabetics  Family history of glaucoma  African Americans 75 yrs +  Hispanic Americans 89 yrs + Annually - requires coppay      Hepatitis C Z72.89 or F19.20  High Risk for HCV  Born between 1945 and 1965  Annually  Once      HIV Z11.4 All adults based on risk  Annually btw ages 3 & 71 regardless of risk  Annually > 65 yrs if at increased risk      Lung Cancer Screening  Asymptomatic adults aged 77-77 with 30 pack yr history and current smoker OR quit within the last 15 yrs Annually Must have counseling and shared decision making documentation before first screen      Medical Nutrition Therapy Adults with   Diabetes  Renal disease  Kidney transplant within past 3 yrs 3 hours first year; 2 hours subsequent years     Obesity and Counseling All adults Screening once a year Counseling if BMI 30 or higher  Today   Tobacco Use Counseling Adults who use tobacco  Up to 8 visits in one year     Vaccines Z23  Hepatitis B  Influenza   Pneumonia  Adults   Once  Once every flu season  Two different vaccines separated by one year     Next Annual Wellness Visit People with Medicare Every year  Today     Services & Screenings Women Who How Often Need  Date of Last Service Action  Mammogram  Z12.31 Women over 37 One baseline ages 30-39. Annually ager 40 yrs+      Pap tests All women Annually if high risk. Every 2 yrs for normal risk women  Screening for cervical cancer with   Pap (Z01.419 nl or Z01.411abnl) &  HPV Z11.51 Women aged 12 to 55 Once every 5 yrs     Screening pelvic and breast exams All women Annually if high risk. Every 2 yrs for normal risk women     Sexually Transmitted Diseases  Chlamydia  Gonorrhea  Syphilis All at risk adults Annually for non pregnant females at increased risk         [place] Men Who How Ofter Need  Date of Last Service Action  Prostate Cancer - DRE & PSA Men over 50 Annually.  DRE might require a copay.        Sexually Transmitted Diseases  Syphilis All at risk adults Annually for men at increased risk      Health Maintenance List Health Maintenance  Topic Date Due  . COLONOSCOPY (Pts 45-36yrs Insurance coverage will need to be confirmed)  05/12/2014  . COVID-19 Vaccine (3 - Pfizer risk 4-dose series) 11/19/2020  . INFLUENZA VACCINE  03/20/2021  . TETANUS/TDAP  09/30/2021  .  MAMMOGRAM  03/08/2022  . Hepatitis C Screening  Completed  . HIV Screening  Completed  . HPV VACCINES  Aged Out

## 2021-01-30 ENCOUNTER — Telehealth: Payer: Self-pay | Admitting: *Deleted

## 2021-01-30 ENCOUNTER — Emergency Department (HOSPITAL_COMMUNITY): Payer: Medicare Other

## 2021-01-30 ENCOUNTER — Other Ambulatory Visit: Payer: Self-pay

## 2021-01-30 ENCOUNTER — Emergency Department (HOSPITAL_COMMUNITY)
Admission: EM | Admit: 2021-01-30 | Discharge: 2021-01-31 | Disposition: A | Discharge status: Home / Self Care | Payer: Medicare Other | Attending: Physician Assistant | Admitting: Physician Assistant

## 2021-01-30 DIAGNOSIS — R55 Syncope and collapse: Secondary | ICD-10-CM | POA: Insufficient documentation

## 2021-01-30 DIAGNOSIS — Z5321 Procedure and treatment not carried out due to patient leaving prior to being seen by health care provider: Secondary | ICD-10-CM | POA: Diagnosis not present

## 2021-01-30 DIAGNOSIS — R002 Palpitations: Secondary | ICD-10-CM | POA: Insufficient documentation

## 2021-01-30 LAB — CBC WITH DIFFERENTIAL/PLATELET
Abs Immature Granulocytes: 0.01 10*3/uL (ref 0.00–0.07)
Basophils Absolute: 0 10*3/uL (ref 0.0–0.1)
Basophils Relative: 0 %
Eosinophils Absolute: 0.2 10*3/uL (ref 0.0–0.5)
Eosinophils Relative: 2 %
HCT: 38.1 % (ref 36.0–46.0)
Hemoglobin: 12.4 g/dL (ref 12.0–15.0)
Immature Granulocytes: 0 %
Lymphocytes Relative: 53 %
Lymphs Abs: 3.5 10*3/uL (ref 0.7–4.0)
MCH: 28.8 pg (ref 26.0–34.0)
MCHC: 32.5 g/dL (ref 30.0–36.0)
MCV: 88.6 fL (ref 80.0–100.0)
Monocytes Absolute: 0.4 10*3/uL (ref 0.1–1.0)
Monocytes Relative: 6 %
Neutro Abs: 2.6 10*3/uL (ref 1.7–7.7)
Neutrophils Relative %: 39 %
Platelets: 294 10*3/uL (ref 150–400)
RBC: 4.3 MIL/uL (ref 3.87–5.11)
RDW: 13.5 % (ref 11.5–15.5)
WBC: 6.6 10*3/uL (ref 4.0–10.5)
nRBC: 0 % (ref 0.0–0.2)

## 2021-01-30 LAB — BASIC METABOLIC PANEL
Anion gap: 8 (ref 5–15)
BUN: 12 mg/dL (ref 8–23)
CO2: 27 mmol/L (ref 22–32)
Calcium: 9.3 mg/dL (ref 8.9–10.3)
Chloride: 106 mmol/L (ref 98–111)
Creatinine, Ser: 1.11 mg/dL — ABNORMAL HIGH (ref 0.44–1.00)
GFR, Estimated: 56 mL/min — ABNORMAL LOW (ref >60)
Glucose, Bld: 92 mg/dL (ref 70–99)
Potassium: 3.7 mmol/L (ref 3.5–5.1)
Sodium: 141 mmol/L (ref 135–145)

## 2021-01-30 LAB — TROPONIN I (HIGH SENSITIVITY): Troponin I (High Sensitivity): <2 ng/L (ref <18)

## 2021-01-30 NOTE — ED Triage Notes (Signed)
Pt came in with c/o heart palp times one week. Pt states that it feels like when she had a prior blood clot. Pt states that when it happens she feels like she is going to pass out. Went to [facility] and they sent her here

## 2021-01-30 NOTE — ED Provider Notes (Signed)
Emergency Medicine Provider Triage Evaluation Note  [name] , a 60 y.o. female  was evaluated in triage.  Pt complains of palpitations and near syncope that has been ongoing intermittently for 1 week. Reports when the episodes happy she has some sob. She denies persistent sob or chest pain  Review of Systems  Positive: Palpitations, sob Negative: Chest pain  Physical Exam  BP (!) 150/96 (BP Location: Right Arm)   Pulse 70   Temp 99 F (37.2 C) (Oral)   Resp 18   Ht 5\' 11"  (1.803 m)   Wt 117 kg   SpO2 99%   BMI 35.98 kg/m  Gen:   Awake, no distress   Resp:  Normal effort  MSK:   Moves extremities without difficulty  Other:  Lungs ctab, heart with rrr  Medical Decision Making  Medically screening exam initiated at 7:30 PM.  Appropriate orders placed.  [name] was informed that the remainder of the evaluation will be completed by another provider, this initial triage assessment does not replace that evaluation, and the importance of remaining in the ED until their evaluation is complete.    Bishop Dublin 01/30/21 1933    Truddie Hidden, MD 01/30/21 [phone]

## 2021-01-30 NOTE — Telephone Encounter (Signed)
Agree  Thank you

## 2021-01-30 NOTE — Telephone Encounter (Signed)
Call from requesting an appt. States she's having heart palpitations- she gets sob, heart beats fast and she feels like she might pass out. Started about a week ago and now happening more frequently. Comes on quickly then leaves quickly. No available appts today - inform pt to go to the ED to be evaluated today; hesitant but states she will go.

## 2021-02-09 ENCOUNTER — Telehealth: Payer: Self-pay | Admitting: *Deleted

## 2021-02-09 NOTE — Telephone Encounter (Signed)
Patient called in requesting appt for CP and was transferred to Triage. Patient c/o 1-2 week hx of non-radiating CP that lasts 1-2 seconds. Episodes occur once daily and she rates the pain 6-7/10. Denies SHOB. States she was seen in ED on 6/13 for this and they didn't see anything wrong (EKG, Chest x-ray, and troponins were normal). First available appt given for 6/27 at 0915. She is strongly cautioned to be evaluated in ED immediately if pain last longer, begins to radiate, or develops SHOB. She agrees.

## 2021-02-09 NOTE — Telephone Encounter (Signed)
Thank you :)

## 2021-02-13 ENCOUNTER — Encounter: Payer: Medicare Other | Admitting: Internal Medicine

## 2021-02-14 ENCOUNTER — Encounter: Payer: Self-pay | Admitting: *Deleted

## 2021-02-14 ENCOUNTER — Ambulatory Visit (INDEPENDENT_AMBULATORY_CARE_PROVIDER_SITE_OTHER): Payer: Medicare Other | Admitting: Internal Medicine

## 2021-02-14 ENCOUNTER — Other Ambulatory Visit: Payer: Self-pay | Admitting: Internal Medicine

## 2021-02-14 ENCOUNTER — Ambulatory Visit (INDEPENDENT_AMBULATORY_CARE_PROVIDER_SITE_OTHER): Payer: Medicare Other

## 2021-02-14 VITALS — BP 126/79 | HR 73 | Wt 263.6 lb

## 2021-02-14 DIAGNOSIS — R002 Palpitations: Secondary | ICD-10-CM

## 2021-02-14 DIAGNOSIS — R55 Syncope and collapse: Secondary | ICD-10-CM

## 2021-02-14 DIAGNOSIS — R0789 Other chest pain: Secondary | ICD-10-CM | POA: Insufficient documentation

## 2021-02-14 DIAGNOSIS — G4700 Insomnia, unspecified: Secondary | ICD-10-CM

## 2021-02-14 DIAGNOSIS — R079 Chest pain, unspecified: Secondary | ICD-10-CM | POA: Diagnosis not present

## 2021-02-14 DIAGNOSIS — G4739 Other sleep apnea: Secondary | ICD-10-CM | POA: Diagnosis not present

## 2021-02-14 MED ORDER — OZEMPIC (0.25 OR 0.5 MG/DOSE) 2 MG/1.5ML ~~LOC~~ SOPN: 0.2500 mg | PEN_INJECTOR | SUBCUTANEOUS | 0 refills | Status: DC

## 2021-02-14 NOTE — Patient Instructions (Addendum)
It was a pleasure meeting you today.  I sent in an order for cardiac monitoring to rule out any arrhythmia that may be causing her symptoms.  Someone should call you to set up an appointment for this.  I want to follow-up in about 1 month after your cardiac monitoring for 2 weeks.  In the meantime if you have persistent or worsening of your chest pain and or shortness of breath I would recommend returning to the emergency department for further evaluation.

## 2021-02-14 NOTE — Assessment & Plan Note (Addendum)
She complains of 1 to 2-week history of nonradiating chest discomfort that lasts for a couple seconds at a time.  Episodes occur about once daily.  She states that it feels like a large palpitation in the center of her chest and like her heart is sinking/dropping sensation.  She has associated lightheadedness, dizziness, cough and neck pain.  She denies any nausea, vomiting radiation into her arms or jaw or shortness of breath.  Denies any syncope.  She rates the pain a 6-7 out of 10.  She was seen in the emergency department on 6/13 and work-up with chest x-ray EKG and troponins was unremarkable.   Heart score is 4.  Exam is unremarkable.  Assessment/plan: Differential diagnosis includes cardiac arrhythmia versus GERD.  Unclear etiology at this point and unremarkable cardiac work-up in the emergency department.  She has been symptom-free for 2 days.  Will refer for cardiac monitoring to rule out an underlying cardiac arrhythmia.  - Referral for cardiac monitoring for 2 weeks -Follow-up in about 4 weeks

## 2021-02-14 NOTE — Progress Notes (Unsigned)
Patient enrolled for [employer] to mail a 14 day ZIO XT monitor to address on file. Letter with instructions mailed to patient.

## 2021-02-14 NOTE — Progress Notes (Signed)
   CC: Chest pain  HPI:  [name] is a 60 y.o. with a past medical history listed below presenting for evaluation of chest pain.  She complains of 1 to 2-week history of nonradiating chest pain that lasts for a couple seconds at a time.  Episodes occur about once daily.  She rates the pain a 6-7 out of 10.  Denies any shortness of breath.  She was seen in the emergency department on 6/13 and work-up with chest x-ray EKG and troponins was unremarkable. For details of today's visit and the status of his chronic medical issues please refer to the assessment and plan.   Past Medical History:  Diagnosis Date   Anemia 2009    Baseline hemoglobin at 11, secondary to iron deficiency   Arthritis    Breast mass 02/2008    Noted on mammogram March 11, 2008 3 cm simple cyst at 9 to 10:00 position of the right breast as well as multiple other smaller simple cyst, 2.6 cm mass at the 10:00 position of the right breast also representing complex cyst,   Deep vein thrombosis (DVT) ([place])     history of multiple  DVTs in 1989, 1989, 2004, on chronic anticoagulation with Coumadin   Diverticulosis    noted colonoscopy 2010   Eczema    Fibroid uterus  2001    status post total abdominal hysterectomy, right and left salpingo-oophorectomy , done by Dr. [name]   Heart murmur    as a child   Hypertension    PE (pulmonary embolism)     history of multiple PEs in 1984, 1989, 2004-  electronic data reviewed in 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  HENT:  Negative for congestion, sinus pain and sore throat.   Respiratory:  Positive for cough. Negative for sputum production, shortness of breath and wheezing.   Cardiovascular:  Positive for chest pain and palpitations. Negative for leg swelling.  Gastrointestinal:  Negative for abdominal pain, constipation, diarrhea, heartburn, nausea and vomiting.  Musculoskeletal:  Positive for neck pain.  Neurological:  Positive for dizziness and headaches.    Physical Exam:  Vitals:   02/14/21 1450  BP: 126/79  Pulse: 73  SpO2: 100%  Weight: 263 lb 9.6 oz (119.6 kg)    Physical Exam General: alert, appears stated age, in no acute distress HEENT: Normocephalic, atraumatic, EOM intact, conjunctiva normal CV: Regular rate and rhythm, no murmurs rubs or gallops Pulm: Clear to auscultation bilaterally, normal work of breathing Abdomen: Soft, nondistended, bowel sounds present, no tenderness to palpation MSK: No lower extremity edema Skin: Warm and dry Neuro: Alert and oriented x3   Assessment & Plan:   See Encounters Tab for problem based charting.  Patient discussed with Dr. Philipp Ovens

## 2021-02-15 ENCOUNTER — Telehealth: Payer: Self-pay

## 2021-02-15 NOTE — Assessment & Plan Note (Signed)
Patient states that she is interested in medications to help with weight loss.  States that she has tried everything from dieting, exercise and medications/supplements with no help.  She previously tried orlistat.  Discussed dieting and exercise as well as the medication Ozempic.  Patient is interested at this time.  Plan: Start Ozempic, uptitrate every 4-week   ADDENDUM: Patient reports that the medication is costing her $200.  We will see if there is a prior authorization for this medication

## 2021-02-15 NOTE — Telephone Encounter (Signed)
Requesting to speak with a nurse about diabetes medication cost $200.00, states she cannot afford this. Please call pt back.

## 2021-02-15 NOTE — Assessment & Plan Note (Signed)
Patient reports continued difficulty with sleeping.  She states that she is able to fall asleep but does not stay asleep.  She states that she has tried many different agents such as melatonin, Benadryl, Flexeril and now trazodone none of which have significantly helped except for Flexeril she states.  She states that she has been counseled on sleep hygiene.  She does endorse morning headaches and snoring.  Assessment/plan: Patient is at high risk for sleep apnea, will refer for sleep study.  Recommended that she can continue trying a higher dose of trazodone however patient declines at this time.  -Referral for sleep study

## 2021-02-16 ENCOUNTER — Other Ambulatory Visit: Payer: Self-pay | Admitting: Internal Medicine

## 2021-02-16 ENCOUNTER — Ambulatory Visit: Payer: Medicare Other

## 2021-02-16 ENCOUNTER — Telehealth: Payer: Self-pay

## 2021-02-16 NOTE — Telephone Encounter (Signed)
Pt is requesting a call back .. pt stated that the Dr gave her Semaglutide,0.25 or 0.5MG /DOS, (OZEMPIC, 0.25 OR 0.5 MG/DOSE,) 2 MG/1.5ML SOPN but when she went to pick it up it was $190  but the org price was $900 . She is wanting to know if there is a cheaper brand of medicine that she can get

## 2021-02-16 NOTE — Addendum Note (Signed)
Addended by: Mike Craze on: 02/16/2021 04:56 PM   Modules accepted: Orders

## 2021-02-16 NOTE — Progress Notes (Signed)
Internal Medicine Clinic Attending ° °Case discussed with Dr. Rehman  At the time of the visit.  We reviewed the resident’s history and exam and pertinent patient test results.  I agree with the assessment, diagnosis, and plan of care documented in the resident’s note.  ° °

## 2021-02-16 NOTE — Telephone Encounter (Signed)
I will ask our pharmacy team to help pt.

## 2021-02-21 NOTE — Telephone Encounter (Signed)
Call to [facility].  Patient picked up medication on 02/19/2021 for $189.00.

## 2021-02-23 ENCOUNTER — Other Ambulatory Visit: Payer: Self-pay | Admitting: *Deleted

## 2021-02-23 MED ORDER — ALBUTEROL SULFATE HFA 108 (90 BASE) MCG/ACT IN AERS: 1.0000 | INHALATION_SPRAY | Freq: Four times a day (QID) | RESPIRATORY_TRACT | 1 refills | Status: DC | PRN

## 2021-02-23 MED ORDER — AMLODIPINE BESYLATE 10 MG PO TABS: 10.0000 mg | ORAL_TABLET | Freq: Every day | ORAL | 1 refills | Status: DC

## 2021-02-24 NOTE — Progress Notes (Signed)
Submitted application for OZEMPIC 2MG /1.5ML to [facility] for patient assistance.   Phone: [phone]

## 2021-02-26 DIAGNOSIS — R079 Chest pain, unspecified: Secondary | ICD-10-CM | POA: Diagnosis not present

## 2021-02-26 DIAGNOSIS — R55 Syncope and collapse: Secondary | ICD-10-CM | POA: Diagnosis not present

## 2021-02-26 DIAGNOSIS — R002 Palpitations: Secondary | ICD-10-CM | POA: Diagnosis not present

## 2021-03-13 ENCOUNTER — Other Ambulatory Visit: Payer: Medicare Other

## 2021-03-14 NOTE — Progress Notes (Addendum)
Received notification from [facility] regarding approval for [place]. Patient assistance approved from 03/09/21 to 03/03/22.   Medication will ship to office in 10-14 business days for a 4 month supply.  Phone: [phone]

## 2021-03-17 ENCOUNTER — Other Ambulatory Visit: Payer: Medicare Other

## 2021-03-24 ENCOUNTER — Telehealth: Payer: Self-pay

## 2021-03-24 NOTE — Telephone Encounter (Signed)
Informed pt that 4 boxes of medication are ready for pickup. Pt can come on Monday or Tuesday.  ozempic in med room fridge labeled & ready.

## 2021-03-30 ENCOUNTER — Ambulatory Visit (INDEPENDENT_AMBULATORY_CARE_PROVIDER_SITE_OTHER): Payer: Medicare (Managed Care) | Admitting: Student

## 2021-03-30 ENCOUNTER — Encounter: Payer: Self-pay | Admitting: Student

## 2021-03-30 VITALS — BP 135/79 | HR 70 | Temp 98.1°F | Ht 71.0 in | Wt 256.0 lb

## 2021-03-30 DIAGNOSIS — R21 Rash and other nonspecific skin eruption: Secondary | ICD-10-CM | POA: Diagnosis not present

## 2021-03-30 DIAGNOSIS — R252 Cramp and spasm: Secondary | ICD-10-CM | POA: Insufficient documentation

## 2021-03-30 DIAGNOSIS — Z1211 Encounter for screening for malignant neoplasm of colon: Secondary | ICD-10-CM | POA: Diagnosis not present

## 2021-03-30 DIAGNOSIS — Z0000 Encounter for general adult medical examination without abnormal findings: Secondary | ICD-10-CM

## 2021-03-30 DIAGNOSIS — E782 Mixed hyperlipidemia: Secondary | ICD-10-CM

## 2021-03-30 NOTE — Progress Notes (Signed)
   CC: Rash, bilateral leg cramps  HPI:  [name] is a 60 y.o. with past medical history of hypertension, who presented to the clinic for evaluation of red rash and bilateral leg cramps.  See problem based charting for details  Past Medical History:  Diagnosis Date   Anemia 2009    Baseline hemoglobin at 11, secondary to iron deficiency   Arthritis    Breast mass 02/2008    Noted on mammogram March 11, 2008 3 cm simple cyst at 9 to 10:00 position of the right breast as well as multiple other smaller simple cyst, 2.6 cm mass at the 10:00 position of the right breast also representing complex cyst,   Deep vein thrombosis (DVT) ([place])     history of multiple  DVTs in 1989, 1989, 2004, on chronic anticoagulation with Coumadin   Diverticulosis    noted colonoscopy 2010   Eczema    Fibroid uterus  2001    status post total abdominal hysterectomy, right and left salpingo-oophorectomy , done by Dr. 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    Appearance: She is not toxic-appearing.  HENT:     Head: Normocephalic.  Eyes:     Conjunctiva/sclera:  Conjunctivae normal.  Cardiovascular:     Rate and Rhythm: Normal rate and regular rhythm.     Heart sounds: Normal heart sounds.  Pulmonary:     Effort: Pulmonary effort is normal. No respiratory distress.     Breath sounds: Normal breath sounds. No wheezing.  Musculoskeletal:     Cervical back: Normal range of motion.     Comments: Normal appearance of bilateral lower legs.  No edema.  Tenderness to palpation of anterior lower LE.  No erythema or pain to palpation of the posterior leg. Diminished pulses of left foot compared to right.  Skin:    Comments: See picture for rash  Neurological:     Mental Status: She is alert and oriented to person, place, and time.  Psychiatric:        Mood and Affect: Mood normal.        Behavior: Behavior normal.        Assessment & Plan:   See Encounters Tab for problem based charting.  Patient discussed with Dr. Philipp Ovens

## 2021-03-30 NOTE — Assessment & Plan Note (Signed)
Colonoscopy referral today

## 2021-03-30 NOTE — Patient Instructions (Addendum)
[name],  It was a pleasure seeing you in the clinic today.  Here is a summary what we talked about:  1.  Rash: The rash is only on your sun exposure area.  Please use a hypoallergenic moisturizer for the dry skin.  Also apply sunscreen when you go out of the sun.  2.  Leg cramps: This is likely charley horse.  I will also check blood work to rule out any electrolyte abnormality.  Please try stretching exercise at home and hydrate appropriately.  Please avoid overuse of your muscle.  3.  Referral to colonoscopy sent  Return in 6 months  Take care,  Dr. Alfonse Spruce

## 2021-03-30 NOTE — Assessment & Plan Note (Signed)
Patient reports bilateral lower leg cramps that started 1 month ago and progressively worsened.  [name] locates that bilateral anterior lower legs and radiates to the back.  She denies claudication symptoms.  States that driving makes it worse, she has to stop and massage her legs.  States that it does wake her up at night sometimes.  She denies restless leg symptoms.  She denies any change in her activities or overuse of muscles.  Physical exam showed tenderness to palpation of bilateral anterior low LE.  There is no erythema, swelling or tenderness to palpation of the posterior legs, doubt DVT.  CBC checked 2 months ago showed normal hemoglobin.  Last BMP showed low normal potassium and normal calcium.   There was diminished left pedis pulse on exam.  ABI was performed in the office which came back normal bilaterally.  Assessment and plan This is likely charley horse.  Low suspicion for DVT.  PAD was ruled out with normal ABI.  -Will obtain lipid panel, and BMP today -Advised patient to try stretching exercise and keep up with hydration

## 2021-03-30 NOTE — Assessment & Plan Note (Signed)
Patient reports rash of bilateral arms that started 2 days ago.  Rash mainly located at the bilateral shoulder, only on the sun exposed skin.  Patient was at the beach a week ago.  She denies pruritus, pain of the rash.    See picture for rash  Assessment and plan Physical exam reveal silvery scale rash concentrated at bilateral shoulder, rash is not raised.  There is dry flaky skin.  Rash only at the sun exposed area.  This is likely irritation from sun or weather.  Advised patient to use a hypoallergenic moisturizer.  Also advised her to use sunscreen when go out to the sun.

## 2021-03-31 LAB — BMP8+ANION GAP
Anion Gap: 17 mmol/L (ref 10.0–18.0)
BUN/Creatinine Ratio: 11 — ABNORMAL LOW (ref 12–28)
BUN: 12 mg/dL (ref 8–27)
CO2: 21 mmol/L (ref 20–29)
Calcium: 9.7 mg/dL (ref 8.7–10.3)
Chloride: 99 mmol/L (ref 96–106)
Creatinine, Ser: 1.08 mg/dL — ABNORMAL HIGH (ref 0.57–1.00)
Glucose: 75 mg/dL (ref 65–99)
Potassium: 4.2 mmol/L (ref 3.5–5.2)
Sodium: 137 mmol/L (ref 134–144)
eGFR: 58 mL/min/{1.73_m2} — ABNORMAL LOW (ref >59)

## 2021-03-31 LAB — LIPID PANEL
Chol/HDL Ratio: 4.3 ratio (ref 0.0–4.4)
Cholesterol, Total: 226 mg/dL — ABNORMAL HIGH (ref 100–199)
HDL: 53 mg/dL (ref >39)
LDL Chol Calc (NIH): 158 mg/dL — ABNORMAL HIGH (ref 0–99)
Triglycerides: 84 mg/dL (ref 0–149)
VLDL Cholesterol Cal: 15 mg/dL (ref 5–40)

## 2021-04-03 MED ORDER — ROSUVASTATIN CALCIUM 20 MG PO TABS: 20.0000 mg | ORAL_TABLET | Freq: Every day | ORAL | 11 refills | Status: DC

## 2021-04-03 NOTE — Addendum Note (Signed)
Addended byGaylan Gerold on: 04/03/2021 01:19 PM   Modules accepted: Orders

## 2021-04-03 NOTE — Assessment & Plan Note (Addendum)
10 years ASCVD 6.7-10%.  Recommended moderate to high intensity statin.  Discussed with patient the benefits of treating hypercholesteremia as well as the side effects of statin.  Patient agrees with the plan.  -Start Crestor 20 mg -Repeat lipid panel at next visit to confirm reduction of LDL

## 2021-04-04 NOTE — Progress Notes (Signed)
Internal Medicine Clinic Attending  Case discussed with Dr. Nguyen  At the time of the visit.  We reviewed the resident's history and exam and pertinent patient test results.  I agree with the assessment, diagnosis, and plan of care documented in the resident's note. 

## 2021-04-14 ENCOUNTER — Other Ambulatory Visit: Payer: Medicare Other

## 2021-04-28 ENCOUNTER — Other Ambulatory Visit: Payer: Self-pay | Admitting: Internal Medicine

## 2021-05-01 ENCOUNTER — Other Ambulatory Visit: Payer: Medicare (Managed Care)

## 2021-06-20 ENCOUNTER — Ambulatory Visit (INDEPENDENT_AMBULATORY_CARE_PROVIDER_SITE_OTHER): Payer: Medicare Other | Admitting: *Deleted

## 2021-06-20 ENCOUNTER — Telehealth: Payer: Self-pay

## 2021-06-20 DIAGNOSIS — Z23 Encounter for immunization: Secondary | ICD-10-CM | POA: Diagnosis not present

## 2021-06-20 NOTE — Telephone Encounter (Signed)
Called pt back after pt presented to office to pickup medication.  Ozempic pens from august were already picked up back in August. (4 month supply) I forgot to document in Epic, but pt did confirm she got them and was told her next refill will be ready in November. Medication has not arrived yet. Pt was told she will receive a phone call once medication is delivered to office.

## 2021-06-27 ENCOUNTER — Ambulatory Visit: Payer: Medicare (Managed Care)

## 2021-07-03 ENCOUNTER — Telehealth: Payer: Self-pay

## 2021-07-03 NOTE — Telephone Encounter (Signed)
Pt want to know if Ozempic is ready to be pick up. Please call pt back.

## 2021-07-03 NOTE — Telephone Encounter (Signed)
Per Camille's last note, pharmacy team will call patient when Ozempic comes in.  Patient requesting if medication is not in tomorrow, if someone from pharmacy team can call her an give her an update when med might come in. [place], RN,BSN

## 2021-07-05 ENCOUNTER — Telehealth: Payer: Self-pay

## 2021-07-05 ENCOUNTER — Other Ambulatory Visit (HOSPITAL_COMMUNITY): Payer: Self-pay

## 2021-07-05 NOTE — Telephone Encounter (Signed)
Spoke with pt regarding ozempic mediation.  Pt is still waiting on medication to ship to office from [facility]. However pt now has insurance and may be able to pick this medication up from her pharmacy at no charge (according to the test claim we ran). Pt agrees to pickup medication from pharmacy if it has no charge. I let pt know I would forward the message to her provider to get a new RX sent in.

## 2021-07-06 ENCOUNTER — Other Ambulatory Visit: Payer: Self-pay | Admitting: Student

## 2021-07-06 MED ORDER — OZEMPIC (1 MG/DOSE) 2 MG/1.5ML ~~LOC~~ SOPN: 2.0000 mg | PEN_INJECTOR | SUBCUTANEOUS | 3 refills | Status: DC

## 2021-07-06 MED ORDER — OZEMPIC (2 MG/DOSE) 8 MG/3ML ~~LOC~~ SOPN: 2.0000 mg | PEN_INJECTOR | SUBCUTANEOUS | 2 refills | Status: DC

## 2021-07-06 NOTE — Addendum Note (Signed)
Addended by: Riesa Pope on: 07/06/2021 09:47 AM   Modules accepted: Orders

## 2021-07-10 ENCOUNTER — Other Ambulatory Visit: Payer: Self-pay | Admitting: Student

## 2021-07-10 MED ORDER — OZEMPIC (1 MG/DOSE) 4 MG/3ML ~~LOC~~ SOPN: 2.0000 mg | PEN_INJECTOR | SUBCUTANEOUS | 3 refills | Status: DC

## 2021-07-10 MED ORDER — OZEMPIC (2 MG/DOSE) 8 MG/3ML ~~LOC~~ SOPN
2.0000 mg | PEN_INJECTOR | SUBCUTANEOUS | 2 refills | Status: DC
Start: 2021-07-10 — End: 2021-07-10

## 2021-07-10 NOTE — Telephone Encounter (Signed)
Refill Request- [facility] .  She has checked with [facility] and would like her prescription to be sent to their pharmacy instead.   Semaglutide, 2 MG/DOSE, (OZEMPIC, 2 MG/DOSE,) 8 MG/3ML SOPN

## 2021-07-10 NOTE — Addendum Note (Signed)
Addended by: Riesa Pope on: 07/10/2021 04:21 PM   Modules accepted: Orders

## 2021-07-10 NOTE — Telephone Encounter (Signed)
Wrote prescription for 1 mg, with instructions for patient to inject twice.

## 2021-07-10 NOTE — Telephone Encounter (Signed)
Patient called back stating she misunderstood [facility]. They have 2 boxes left of the 1 mg ozempic. Patient is requesting Rx be sent for 1 mg. Then wants to know if she'll need to take 2 shots.

## 2021-08-31 ENCOUNTER — Other Ambulatory Visit (HOSPITAL_COMMUNITY): Payer: Self-pay

## 2021-08-31 ENCOUNTER — Ambulatory Visit (INDEPENDENT_AMBULATORY_CARE_PROVIDER_SITE_OTHER): Payer: Medicare Other | Admitting: Student

## 2021-08-31 VITALS — BP 134/70 | HR 81 | Temp 98.1°F | Ht 71.0 in | Wt 244.9 lb

## 2021-08-31 DIAGNOSIS — M546 Pain in thoracic spine: Secondary | ICD-10-CM | POA: Diagnosis not present

## 2021-08-31 DIAGNOSIS — E782 Mixed hyperlipidemia: Secondary | ICD-10-CM

## 2021-08-31 DIAGNOSIS — M791 Myalgia, unspecified site: Secondary | ICD-10-CM | POA: Diagnosis not present

## 2021-08-31 DIAGNOSIS — E785 Hyperlipidemia, unspecified: Secondary | ICD-10-CM | POA: Diagnosis not present

## 2021-08-31 NOTE — Patient Instructions (Signed)
It was a pleasure seeing you in clinic. Today we discussed:   Back/shoulder pain: We will check labs and xrays to evaluate your back pain, I will call with the results. In the meantime you try tylenol 1000 mg three times  daily as needed as well as over the counter lidocaine patches   If you have any questions or concerns, please call our clinic at [phone] between 9am-5pm and after hours call [phone] and ask for the internal medicine resident on call. If you feel you are having a medical emergency please call 911.   Thank you, we look forward to helping you remain healthy!

## 2021-09-02 LAB — LIPID PANEL
Chol/HDL Ratio: 2.8 ratio (ref 0.0–4.4)
Cholesterol, Total: 155 mg/dL (ref 100–199)
HDL: 55 mg/dL (ref >39)
LDL Chol Calc (NIH): 84 mg/dL (ref 0–99)
Triglycerides: 84 mg/dL (ref 0–149)
VLDL Cholesterol Cal: 16 mg/dL (ref 5–40)

## 2021-09-02 LAB — BMP8+ANION GAP
Anion Gap: 13 mmol/L (ref 10.0–18.0)
BUN/Creatinine Ratio: 10 — ABNORMAL LOW (ref 12–28)
BUN: 13 mg/dL (ref 8–27)
CO2: 22 mmol/L (ref 20–29)
Calcium: 10 mg/dL (ref 8.7–10.3)
Chloride: 104 mmol/L (ref 96–106)
Creatinine, Ser: 1.3 mg/dL — ABNORMAL HIGH (ref 0.57–1.00)
Glucose: 93 mg/dL (ref 70–99)
Potassium: 4 mmol/L (ref 3.5–5.2)
Sodium: 139 mmol/L (ref 134–144)
eGFR: 46 mL/min/{1.73_m2} — ABNORMAL LOW (ref >59)

## 2021-09-02 LAB — C-REACTIVE PROTEIN: CRP: 2 mg/L (ref 0–10)

## 2021-09-02 LAB — TSH: TSH: 1.05 u[IU]/mL (ref 0.450–4.500)

## 2021-09-02 LAB — SEDIMENTATION RATE: Sed Rate: 30 mm/hr (ref 0–40)

## 2021-09-04 DIAGNOSIS — M549 Dorsalgia, unspecified: Secondary | ICD-10-CM | POA: Insufficient documentation

## 2021-09-04 DIAGNOSIS — M25519 Pain in unspecified shoulder: Secondary | ICD-10-CM | POA: Insufficient documentation

## 2021-09-04 NOTE — Assessment & Plan Note (Signed)
Repeat lipid that will today.  She is concerned that she is on a high dose of rosuvastatin and this may be to the pain in her back and shoulders.  Discussed that it would be unlikely that the rosuvastatin is causing pain she is having.  Continue encourage diet and lifestyle modification.  Lipid panel CK  ADDENDUM Lipid panel today with improved LDL from 1 58-84 and cholesterol down from 1 26-1 55, CK within normal limits.

## 2021-09-04 NOTE — Assessment & Plan Note (Addendum)
Patient today presents with 1 week of pain from at level of her bra strap up to her neck and shoulders.  Pain radiates down bilateral arms.  States she has pain with light touch to her arms and back.  Pain with moving her neck and shoulders as well. Endorses morning stiffness as well as after inactivity.  She denies any injury or obvious cause of her pain.  Does lift weights occasionally but less than 5 pounds has not been doing this for last couple days to see if this improves her pain.  She is very concerned that her medications especially her rosuvastatin may be contributing to her symptoms.  On exam patient with diffuse hyperalgesia of the thoracic back shoulders bilateral arms and neck.  No rashes.  4 out of 5 strength of her shoulders limited by pain.  Has some limited strength in her hip as well with standing.  Differential includes skill skeletal, thoracic or cervical spine pathology, polyarthralgia rheumatica.  Plan Continue Tylenol and lidocaine Cervical and thoracic spine x-rays Check BMP TSH, CK, ESR, CRP  Addendum BMP elevated 1.3 will need rechecked a follow up visit CK, TSH, ESR, and CRP unremarkable Called patient with results, has not had imaging due to family emergency. Discussed stopping stating for a few days to see if this improves her pain, if not counseled that she restart the medication. She will schedule follow up after she completes imaging.

## 2021-09-04 NOTE — Progress Notes (Signed)
CC: back pain  HPI:  [name] is a 61 y.o. female with history listed below presents for upper back and shoulder pain. Please refer to problem based charting for further details and assessment and plan of current problem and chronic medical conditions.   Past Medical History:  Diagnosis Date   Anemia 2009    Baseline hemoglobin at 11, secondary to iron deficiency   Arthritis    Breast mass 02/2008    Noted on mammogram March 11, 2008 3 cm simple cyst at 9 to 10:00 position of the right breast as well as multiple other smaller simple cyst, 2.6 cm mass at the 10:00 position of the right breast also representing complex cyst,   Deep vein thrombosis (DVT) ([place])     history of multiple  DVTs in 1989, 1989, 2004, on chronic anticoagulation with Coumadin   Diverticulosis    noted colonoscopy 2010   Eczema    Fibroid uterus  2001    status post total abdominal hysterectomy, right and left salpingo-oophorectomy , done by Dr. 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  HENT:     Head: Normocephalic and atraumatic.     Nose:  Nose normal.     Mouth/Throat:     Mouth: Mucous membranes are moist.     Pharynx: Oropharynx is clear.  Eyes:     Conjunctiva/sclera: Conjunctivae normal.     Pupils: Pupils are equal, round, and reactive to light.  Cardiovascular:     Rate and Rhythm: Normal rate and regular rhythm.  Pulmonary:     Effort: Pulmonary effort is normal.     Breath sounds: Normal breath sounds.  Abdominal:     General: Abdomen is flat. Bowel sounds are normal. There is no distension.     Palpations: Abdomen is soft.  Musculoskeletal:     Cervical back: Tenderness present. No rigidity.  Lymphadenopathy:     Cervical: No cervical adenopathy.  Skin:    General: Skin is warm and dry.     Capillary Refill: Capillary refill takes less than 2 seconds.     Findings: No lesion or rash.  Neurological:     Mental Status: She is alert.     Comments: Hyperalgesia of the upper back neck and bilateral arms, forearms 5 strength of the bilateral shoulders, normal strength of the elbows and wrists and grip, diminished strength of hips with standing from chair     Assessment & Plan:   See Encounters Tab for problem based charting.  Patient seen with Dr.  Saverio Danker

## 2021-09-05 LAB — SPECIMEN STATUS REPORT

## 2021-09-05 LAB — CK: Total CK: 130 U/L (ref 32–182)

## 2021-09-05 NOTE — Progress Notes (Signed)
Internal Medicine Clinic Attending  I saw and evaluated the patient.  I personally confirmed the key portions of the history and exam documented by Dr. Liang and I reviewed pertinent patient test results.  The assessment, diagnosis, and plan were formulated together and I agree with the documentation in the resident's note.  

## 2021-09-06 ENCOUNTER — Other Ambulatory Visit (HOSPITAL_COMMUNITY): Payer: Self-pay

## 2021-09-10 ENCOUNTER — Other Ambulatory Visit: Payer: Self-pay | Admitting: Student

## 2021-12-01 ENCOUNTER — Other Ambulatory Visit: Payer: Self-pay | Admitting: Student

## 2021-12-05 ENCOUNTER — Encounter: Payer: Medicare Other | Admitting: Student

## 2021-12-12 ENCOUNTER — Encounter: Payer: Medicare Other | Admitting: Student

## 2021-12-22 ENCOUNTER — Encounter: Payer: Medicare Other | Admitting: Student

## 2022-01-03 ENCOUNTER — Encounter: Payer: Self-pay | Admitting: Student

## 2022-01-03 ENCOUNTER — Other Ambulatory Visit: Payer: Self-pay

## 2022-01-03 ENCOUNTER — Telehealth: Payer: Self-pay

## 2022-01-03 ENCOUNTER — Ambulatory Visit (INDEPENDENT_AMBULATORY_CARE_PROVIDER_SITE_OTHER): Payer: Medicare Other | Admitting: Student

## 2022-01-03 VITALS — BP 101/62 | HR 70 | Temp 98.2°F | Ht 71.0 in | Wt 241.2 lb

## 2022-01-03 DIAGNOSIS — K921 Melena: Secondary | ICD-10-CM | POA: Diagnosis not present

## 2022-01-03 DIAGNOSIS — Z7985 Long-term (current) use of injectable non-insulin antidiabetic drugs: Secondary | ICD-10-CM

## 2022-01-03 DIAGNOSIS — R195 Other fecal abnormalities: Secondary | ICD-10-CM

## 2022-01-03 DIAGNOSIS — R7303 Prediabetes: Secondary | ICD-10-CM

## 2022-01-03 DIAGNOSIS — I10 Essential (primary) hypertension: Secondary | ICD-10-CM

## 2022-01-03 DIAGNOSIS — Z0000 Encounter for general adult medical examination without abnormal findings: Secondary | ICD-10-CM

## 2022-01-03 LAB — POCT GLYCOSYLATED HEMOGLOBIN (HGB A1C): Hemoglobin A1C: 5.4 % (ref 4.0–5.6)

## 2022-01-03 LAB — GLUCOSE, CAPILLARY: Glucose-Capillary: 66 mg/dL — ABNORMAL LOW (ref 70–99)

## 2022-01-03 MED ORDER — AMLODIPINE BESYLATE 5 MG PO TABS: 10.0000 mg | ORAL_TABLET | Freq: Every day | ORAL | 3 refills | Status: DC

## 2022-01-03 NOTE — Progress Notes (Deleted)
Hypoglycemic Event ? ?CBG: 66 ? ?Treatment: Dran{Hypoglycemic Treatment (must also place order and document administration):3049002} ? ?Symptoms: None ? ? ?Possible Reasons for Event: {Possible Reasons for Event:3049004} ? ?Comments/MD notified:yes  ? ? ? ?Agata Lucente, Trey Sailors ? ? ? ?

## 2022-01-03 NOTE — Patient Instructions (Addendum)
Abdominal pain ?We will check a CBC today and have you follow up for a colonscopy ? ?Prediabetes ?We will check your A1c today ? ?Weight  ?We will continue with ozempic and continue monitoring weight  ?

## 2022-01-03 NOTE — Progress Notes (Signed)
Blood sugar 66.  Patient was given orange Juice and advised to eat when she leaves the hospital.  Unable to stay as ride was waiting.

## 2022-01-03 NOTE — Telephone Encounter (Signed)
Patient is requesting a call back regarding her BP ?

## 2022-01-03 NOTE — Patient Instructions (Signed)

## 2022-01-03 NOTE — Progress Notes (Signed)
Subjective:   [name] is a 61 y.o. female who presents for an Initial Medicare Annual Wellness Visit. I connected with  Dwyane Dee on 01/03/22   in person visit   . Patient Location: Other:  [facility]   Provider Location: Office/Clinic  I discussed the limitations of evaluation and management by telemedicine. The patient expressed understanding and agreed to proceed.   Review of Systems    DEFERRED TO PCP        Objective:    There were no vitals filed for this visit. There is no height or weight on file to calculate BMI.     08/31/2021    1:31 PM 03/30/2021    2:56 PM 02/14/2021    2:51 PM 01/30/2021    7:16 PM 08/12/2020    9:47 AM 03/23/2020    9:50 AM 02/04/2020    9:13 AM  Advanced Directives  Does Patient Have a Medical Advance Directive? No No No No No No No  Would patient like information on creating a medical advance directive? No - Patient declined No - Patient declined No - Patient declined  No - Patient declined No - Patient declined No - Patient declined    Current Medications (verified) Outpatient Encounter Medications as of 01/03/2022  Medication Sig   albuterol (VENTOLIN HFA) 108 (90 Base) MCG/ACT inhaler Inhale 1-2 puffs into the lungs every 6 (six) hours as needed for wheezing or shortness of breath.   amLODipine (NORVASC) 10 MG tablet Take 1 tablet (10 mg total) by mouth daily.   Multiple Vitamin (MULTIVITAMIN) capsule Take 1 capsule daily by mouth.   OZEMPIC, 2 MG/DOSE, 8 MG/3ML SOPN INJECT 2 MG INTO THE SKIN ONCE A WEEK   rosuvastatin (CRESTOR) 20 MG tablet Take 1 tablet (20 mg total) by mouth daily.   No facility-administered encounter medications on file as of 01/03/2022.    Allergies (verified) Dilaudid [hydromorphone hcl]   History: Past Medical History:  Diagnosis Date   Anemia 2009    Baseline hemoglobin at 11, secondary to iron deficiency   Arthritis    Breast mass 02/2008    Noted on mammogram March 11, 2008 3 cm  simple cyst at 9 to 10:00 position of the right breast as well as multiple other smaller simple cyst, 2.6 cm mass at the 10:00 position of the right breast also representing complex cyst,   Deep vein thrombosis (DVT) ([place])     history of multiple  DVTs in 1989, 1989, 2004, on chronic anticoagulation with Coumadin   Diverticulosis    noted colonoscopy 2010   Eczema    Fibroid uterus  2001    status post total abdominal hysterectomy, right and left salpingo-oophorectomy , done by Dr. 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 [name]   BREAST LUMPECTOMY WITH RADIOACTIVE SEED LOCALIZATION Right 07/09/2017   Procedure: RIGHT BREAST LUMPECTOMY WITH RADIOACTIVE SEED LOCALIZATION;  Surgeon: Erroll Luna, MD;  Location: [facility];  Service: General;  Laterality: Right;   CHOLECYSTECTOMY     1997   MASTECTOMY, PARTIAL  03/2006    left partial mastectomy secondary to fibrocystic disease intraluminal fibroadenoma performed March 28, 2006 done by Dr. [name]   Family History  Problem Relation Age of Onset   Cancer Other    Clotting disorder Other    Gout Father    Stroke Neg Hx    Social History   Socioeconomic History   Marital status: Married    Spouse name: Not on file   Number of children: Not on file   Years of  education: Not on file   Highest education level: Not on file  Occupational History   Not on file  Tobacco Use   Smoking status: Never   Smokeless tobacco: Never  Substance and Sexual Activity   Alcohol use: No    Alcohol/week: 0.0 standard drinks    Comment: rare   Drug use: No   Sexual activity: Not on file  Other Topics Concern   Not on file  Social History Narrative   Not on file   Social Determinants of Health   Financial Resource Strain: Not on file  Food Insecurity: Not on file  Transportation Needs: Not on file  Physical Activity: Not on file  Stress: Not on file  Social Connections: Not on file    Tobacco Counseling Counseling given: Not Answered   Clinical Intake:                 Diabetic? NO          Activities of Daily Living    08/31/2021    1:31 PM  In your present state of health, do you have any difficulty performing the following activities:  Hearing? 0  Vision? 0  Difficulty concentrating or making decisions? 0  Walking or climbing stairs? 0  Dressing or bathing? 0  Doing errands, shopping? 0    Patient Care Team: Iona Beard, MD as PCP - General  Indicate any recent Medical Services you may have received from other than [facility] providers in the past year (date may be approximate).     Assessment:   This is a routine wellness examination for [place].  Hearing/Vision screen No results found.  Dietary issues and exercise activities discussed:     Goals Addressed   None   Depression Screen    08/31/2021    3:04 PM 02/14/2021    3:48 PM 02/04/2020    9:42 AM 05/05/2019   10:58 AM 05/15/2018   10:40 AM 01/28/2018    9:32 AM 12/19/2017   10:35 AM  PHQ 2/9 Scores  PHQ - 2 Score 0 2 0 4 4 0 0  PHQ- 9 Score 0 '4 5 17 16      '$ Fall Risk    08/31/2021    1:31 PM 02/04/2020    9:14 AM 05/05/2019   10:10 AM 05/15/2018    9:40 AM 01/28/2018    9:32 AM  Fall Risk   Falls in the past year? 0 0 0 No Yes  Number falls in past  yr: 0    1  Injury with Fall? 0    No  Risk for fall due to :  No Fall Risks Impaired balance/gait;Impaired mobility  Follow up Falls evaluation completed Falls prevention discussed Falls prevention discussed      FALL RISK PREVENTION PERTAINING TO THE HOME:  Any stairs in or around the home? Yes  If so, are there any without handrails? No  Home free of loose throw rugs in walkways, pet beds, electrical cords, etc? Yes  Adequate lighting in your home to reduce risk of falls? Yes   ASSISTIVE DEVICES UTILIZED TO PREVENT FALLS:  Life alert? No  Use of a cane, walker or w/c? No  Grab bars in the bathroom? No  Shower chair or bench in shower? No  Elevated toilet seat or a handicapped toilet? No   TIMED UP AND GO:  Was the test performed? No .  Length of time to ambulate 10 feet: n/a sec.     Cognitive Function:        Immunizations Immunization History  Administered Date(s) Administered   Influenza,inj,Quad PF,6+ Mos 05/12/2013, 06/20/2021   PFIZER(Purple Top)SARS-COV-2 Vaccination 10/31/2019, 10/22/2020   Tdap 10/01/2011    TDAP status: Due, Education has been provided regarding the importance of this vaccine. Advised may receive this vaccine at local pharmacy or Health Dept. Aware to provide a copy of the vaccination record if obtained from local pharmacy or Health Dept. Verbalized acceptance and understanding.  Flu Vaccine status: Up to date  Pneumococcal vaccine status: Due, Education has been provided regarding the importance of this vaccine. Advised may receive this vaccine at local pharmacy or Health Dept. Aware to provide a copy of the vaccination record if obtained from local pharmacy or Health Dept. Verbalized acceptance and understanding.  Covid-19 vaccine status: Completed vaccines  Qualifies for Shingles Vaccine? Yes   Zostavax completed No   Shingrix Completed?: No.    Education has been provided regarding the importance of this vaccine. Patient has been  advised to call insurance company to determine out of pocket expense if they have not yet received this vaccine. Advised may also receive vaccine at local pharmacy or Health Dept. Verbalized acceptance and understanding.  Screening Tests Health Maintenance  Topic Date Due   Zoster Vaccines- Shingrix (1 of 2) Never done   COLONOSCOPY (Pts 45-38yr Insurance coverage will need to be confirmed)  05/12/2014   COVID-19 Vaccine (3 - Pfizer risk series) 11/19/2020   TETANUS/TDAP  09/30/2021   MAMMOGRAM  03/08/2022   INFLUENZA VACCINE  03/20/2022   Hepatitis C Screening  Completed   HIV Screening  Completed   HPV VACCINES  Aged Out    Health Maintenance  Health Maintenance Due  Topic Date Due   Zoster Vaccines- Shingrix (1 of 2) Never done   COLONOSCOPY (Pts 45-466yrInsurance coverage will need to be confirmed)  05/12/2014   COVID-19 Vaccine (3 - Pfizer risk series) 11/19/2020   TETANUS/TDAP  09/30/2021    Colorectal cancer screening: Type of screening: Colonoscopy. Completed 9/23/210. Repeat every 5 years  Mammogram status: Completed 2. Repeat every year  Bone Density status: Ordered DEFERRED TO PCP . Pt provided with contact info and advised to call to schedule appt.  Lung Cancer Screening: (Low Dose CT Chest recommended if Age 63-80ears, 30 pack-year currently smoking OR have quit w/in 15years.) does not qualify.   Lung Cancer Screening Referral: DEFERRED TO PCP   Additional Screening:  Hepatitis C Screening: does qualify; Completed 04/16/2012  Vision Screening: Recommended annual ophthalmology exams for early detection of glaucoma and other disorders of the eye. Is the patient up to date with their annual eye exam?  Yes  Who is the provider or what is the name of the office in which the patient attends annual eye exams?  [facility]  If pt is not established with a provider, would they like to be referred to a provider to establish care? No .   Dental Screening: Recommended  annual dental exams for proper oral hygiene  Community Resource Referral / Chronic Care Management: CRR required this visit?  No   CCM required this visit?  No      Plan:     I have personally reviewed and noted the following in the patient's chart:   Medical and social history Use of alcohol, tobacco or illicit drugs  Current medications and supplements including opioid prescriptions. Patient is not currently taking opioid prescriptions. Functional ability and status Nutritional status Physical activity Advanced directives List of other physicians Hospitalizations, surgeries, and ER visits in previous 12 months Vitals Screenings to include cognitive, depression, and falls Referrals and appointments  In addition, I have reviewed and discussed with patient certain preventive protocols, quality metrics, and best practice recommendations. A written personalized care plan for preventive services as well as general preventive health recommendations were provided to patient.     Judyann Munson, CMA   01/03/2022   Nurse Notes: in person visit   Ms. Gay Filler , Thank you for taking time to come for your Medicare Wellness Visit. I appreciate your ongoing commitment to your health goals. Please review the following plan we discussed and let me know if I can assist you in the future.   These are the goals we discussed:  Goals      Blood Pressure < 140/90        This is a list of the screening recommended for you and due dates:  Health Maintenance  Topic Date Due   Zoster (Shingles) Vaccine (1 of 2) Never done   Colon Cancer Screening  05/12/2014   COVID-19 Vaccine (3 - Pfizer risk series) 11/19/2020   Tetanus Vaccine  09/30/2021   Mammogram  03/08/2022   Flu Shot  03/20/2022   Hepatitis C Screening: USPSTF Recommendation to screen - Ages 18-79 yo.  Completed   HIV Screening  Completed   HPV Vaccine  Aged Out

## 2022-01-03 NOTE — Progress Notes (Deleted)
Hypoglycemic Event ? ?CBG: 66 ? ?Treatment: Dran{Hypoglycemic Treatment (must also place order and document administration):3049002} ? ?Symptoms: None ? ? ?Possible Reasons for Event: {Possible Reasons for Event:3049004} ? ?Comments/MD notified:yes  ? ? ? ?Faizan Geraci, Trey Sailors ? ? ? ?

## 2022-01-04 LAB — CBC
Hematocrit: 37.6 % (ref 34.0–46.6)
Hemoglobin: 12.5 g/dL (ref 11.1–15.9)
MCH: 28.5 pg (ref 26.6–33.0)
MCHC: 33.2 g/dL (ref 31.5–35.7)
MCV: 86 fL (ref 79–97)
Platelets: 289 10*3/uL (ref 150–450)
RBC: 4.39 x10E6/uL (ref 3.77–5.28)
RDW: 13.3 % (ref 11.7–15.4)
WBC: 5.4 10*3/uL (ref 3.4–10.8)

## 2022-01-05 DIAGNOSIS — R7303 Prediabetes: Secondary | ICD-10-CM | POA: Insufficient documentation

## 2022-01-05 DIAGNOSIS — K921 Melena: Secondary | ICD-10-CM | POA: Insufficient documentation

## 2022-01-05 MED ORDER — AMLODIPINE BESYLATE 5 MG PO TABS: 5.0000 mg | ORAL_TABLET | Freq: Every day | ORAL | 3 refills | Status: DC

## 2022-01-05 NOTE — Progress Notes (Signed)
Established Patient Office Visit  Subjective   Patient ID: [name], female    DOB: [birth year]  Age: 61 y.o. MRN: [id number]  Chief Complaint  Patient presents with   Follow-up    ROUTINE OFFICE VISIT / RIGHT SIDE PAIN-COMES/GOES    [name] presents today for follow up prediabetes and obesity. Please refer to problem based charting for further details and assessment and plan of current problem and chronic medical conditions.    Patient Active Problem List   Diagnosis Date Noted   Prediabetes 01/05/2022   Hematochezia 01/05/2022   Back pain 09/04/2021   Bilateral leg cramps 03/30/2021   Atypical chest pain 02/14/2021   DOE (dyspnea on exertion) 02/04/2020   Shoulder impingement syndrome, right 02/04/2020   Depression 05/16/2018   Neuropathy 01/28/2018   Obesity, morbid ([place]) 09/28/2016   Hyperlipidemia 01/27/2014   Headache(784.0) 09/29/2013   Bilateral knee pain 05/25/2013   Eczema 05/12/2013   Insomnia 05/12/2013   Diverticulosis 04/18/2012   Healthcare maintenance 02/07/2011   Rash 11/13/2007   Upper airway cough syndrome 12/03/2006   Essential hypertension 05/29/2006   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      Review of Systems  All other systems reviewed and are negative.    Objective:     BP 101/62 (BP Location: Left Arm, Patient Position: Sitting, Cuff Size: Large)   Pulse 70   Temp 98.2 F (36.8 C) (Oral)   Ht '5\' 11"'$  (1.803 m)   Wt 241 lb 3.2 oz (109.4 kg)   SpO2 100%   BMI 33.64 kg/m     Physical Exam Constitutional:      General: She is not in acute distress.    Appearance: Normal appearance. She is obese.  HENT:     Mouth/Throat:     Mouth: Mucous membranes are moist.     Pharynx: Oropharynx is clear.  Cardiovascular:     Rate and Rhythm: Normal rate and regular rhythm.     Heart sounds: No murmur heard.   No friction rub. No gallop.  Pulmonary:     Effort: Pulmonary effort is normal.     Breath sounds: Normal  breath sounds. No rhonchi or rales.  Abdominal:     General: Abdomen is flat. Bowel sounds are normal. There is no distension.     Palpations: Abdomen is soft. There is no mass.     Tenderness: There is no abdominal tenderness. There is no guarding or rebound.  Musculoskeletal:     Right lower leg: No edema.  Skin:    General: Skin is warm and dry.     Capillary Refill: Capillary refill takes less than 2 seconds.  Neurological:     Mental Status: She is alert.  Psychiatric:        Mood and Affect: Mood normal.        Behavior: Behavior normal.     Results for orders placed or performed in visit on 01/03/22  Glucose, capillary  Result Value Ref Range   Glucose-Capillary 66 (L) 70 - 99 mg/dL  Results for orders placed or performed in visit on 01/03/22  CBC no Diff  Result Value Ref Range   WBC 5.4 3.4 - 10.8 x10E3/uL   RBC 4.39 3.77 - 5.28 x10E6/uL   Hemoglobin 12.5 11.1 - 15.9 g/dL   Hematocrit 37.6 34.0 - 46.6 %   MCV 86 79 - 97 fL   MCH 28.5 26.6 - 33.0 pg   MCHC 33.2 31.5 - 35.7 g/dL  RDW 13.3 11.7 - 15.4 %   Platelets 289 150 - 450 x10E3/uL  POC Hbg A1C  Result Value Ref Range   Hemoglobin A1C 5.4 4.0 - 5.6 %   HbA1c POC (<> result, manual entry)     HbA1c, POC (prediabetic range)     HbA1c, POC (controlled diabetic range)         The 10-year ASCVD risk score (Arnett DK, et al., 2019) is: 6.9%    Assessment & Plan:   Problem List Items Addressed This Visit       Cardiovascular and Mediastinum   Essential hypertension (Chronic)    Patient called shortly after appointment discuss that blood pressure in office today is lower than usual. She feels does occasionally feel dizziness with standing and noted BP was lower at home after appointment today as well.   Decreased Amlodipine to 5 mg daily Patient will keep BP log at home Encourage PO hydration Follow up in 2 weeks          Relevant Medications   amLODipine (NORVASC) 5 MG tablet   Other Relevant  Orders   For home use only DME Other see comment     Other   Obesity, morbid ([place])    Currently on ozempic 2 mg weekly. Insurance did not approve of increasing to 2.4 mg weekly.  Has lost about 20 pound since starting this in July 2022. Discussed on full dose average weight loss is around 12%. She does note more constipation with starting this. Discussed fiber supplement or miralax to help with this. She would like to discuss diet changes to augment weight loss.  Continue ozempic 2 mg weekly Referral to nutrition       Relevant Orders   Referral to Nutrition and Diabetes Services   Prediabetes - Primary    A1c improved to 5.4% from 5.8%.       Relevant Orders   POC Hbg A1C (Completed)   Referral to Nutrition and Diabetes Services   Hematochezia    Reports several months of dark sticky stool and constipation. She attributes this to starting Ozempic nearly a year ago. Have BM several times a week. No nausea, vomiting, hematemesis, no BRBPR. Last colonoscopy in 2010 noted to have diverticula and hemorrhoids. CBC today with stable hgb. Is due for a colonoscopy. Referral to GI for further evaluation.          Other Visit Diagnoses     Dark stools       Relevant Orders   CBC no Diff (Completed)   Ambulatory referral to Gastroenterology       Return in about 3 months (around 04/05/2022).    Iona Beard, MD  Discussed with Dr. Heber Bancroft

## 2022-01-05 NOTE — Assessment & Plan Note (Signed)
Patient called shortly after appointment discuss that blood pressure in office today is lower than usual. She feels does occasionally feel dizziness with standing and noted BP was lower at home after appointment today as well.   Decreased Amlodipine to 5 mg daily Patient will keep BP log at home Encourage PO hydration Follow up in 2 weeks

## 2022-01-05 NOTE — Assessment & Plan Note (Signed)
Currently on ozempic 2 mg weekly. Insurance did not approve of increasing to 2.4 mg weekly.  Has lost about 20 pound since starting this in July 2022. Discussed on full dose average weight loss is around 12%. She does note more constipation with starting this. Discussed fiber supplement or miralax to help with this. She would like to discuss diet changes to augment weight loss.  Continue ozempic 2 mg weekly Referral to nutrition

## 2022-01-05 NOTE — Assessment & Plan Note (Signed)
Reports several months of dark sticky stool and constipation. She attributes this to starting Ozempic nearly a year ago. Have BM several times a week. No nausea, vomiting, hematemesis, no BRBPR. Last colonoscopy in 2010 noted to have diverticula and hemorrhoids. CBC today with stable hgb. Is due for a colonoscopy. Referral to GI for further evaluation.

## 2022-01-05 NOTE — Assessment & Plan Note (Signed)
A1c improved to 5.4% from 5.8%.

## 2022-01-09 NOTE — Progress Notes (Signed)
Internal Medicine Clinic Attending  Case discussed with the resident at the time of the visit.  We reviewed the resident's history and exam and pertinent patient test results.  I agree with the assessment, diagnosis, and plan of care documented in the resident's note.  

## 2022-01-16 NOTE — Progress Notes (Signed)
Internal Medicine Clinic Attending  Case discussed with Dr. Lisabeth Devoid at the time of the visit.  I reviewed the AWV findings.  I agree with the assessment, diagnosis, and plan of care documented in the AWV note.

## 2022-02-05 DIAGNOSIS — Z800 Family history of malignant neoplasm of digestive organs: Secondary | ICD-10-CM | POA: Diagnosis not present

## 2022-02-05 DIAGNOSIS — R198 Other specified symptoms and signs involving the digestive system and abdomen: Secondary | ICD-10-CM | POA: Diagnosis not present

## 2022-02-05 DIAGNOSIS — K921 Melena: Secondary | ICD-10-CM | POA: Diagnosis not present

## 2022-02-15 DIAGNOSIS — D120 Benign neoplasm of cecum: Secondary | ICD-10-CM | POA: Diagnosis not present

## 2022-02-15 DIAGNOSIS — Z800 Family history of malignant neoplasm of digestive organs: Secondary | ICD-10-CM | POA: Diagnosis not present

## 2022-02-15 DIAGNOSIS — K449 Diaphragmatic hernia without obstruction or gangrene: Secondary | ICD-10-CM | POA: Diagnosis not present

## 2022-02-15 DIAGNOSIS — K2930 Chronic superficial gastritis without bleeding: Secondary | ICD-10-CM | POA: Diagnosis not present

## 2022-02-15 DIAGNOSIS — K5730 Diverticulosis of large intestine without perforation or abscess without bleeding: Secondary | ICD-10-CM | POA: Diagnosis not present

## 2022-02-15 DIAGNOSIS — K648 Other hemorrhoids: Secondary | ICD-10-CM | POA: Diagnosis not present

## 2022-02-15 DIAGNOSIS — D122 Benign neoplasm of ascending colon: Secondary | ICD-10-CM | POA: Diagnosis not present

## 2022-02-15 DIAGNOSIS — K921 Melena: Secondary | ICD-10-CM | POA: Diagnosis not present

## 2022-02-22 DIAGNOSIS — D122 Benign neoplasm of ascending colon: Secondary | ICD-10-CM | POA: Diagnosis not present

## 2022-02-22 DIAGNOSIS — D120 Benign neoplasm of cecum: Secondary | ICD-10-CM | POA: Diagnosis not present

## 2022-02-22 DIAGNOSIS — K2930 Chronic superficial gastritis without bleeding: Secondary | ICD-10-CM | POA: Diagnosis not present

## 2022-03-23 ENCOUNTER — Other Ambulatory Visit: Payer: Self-pay | Admitting: Student

## 2022-03-28 ENCOUNTER — Other Ambulatory Visit: Payer: Self-pay | Admitting: Student

## 2022-03-28 DIAGNOSIS — E782 Mixed hyperlipidemia: Secondary | ICD-10-CM

## 2022-04-05 ENCOUNTER — Telehealth: Payer: Self-pay

## 2022-04-05 NOTE — Telephone Encounter (Signed)
Patient called in requesting refill on flexeril. States she has been exercising a lot lately (running on treadmill) and is experiencing leg cramps. Has used Theraworx foam with relief that only last few seconds. She is advised to schedule appt. First available is 8/22 at 1415. Patient has taken this appt but would like to know what to do in the interim.

## 2022-04-06 NOTE — Telephone Encounter (Addendum)
Relayed info below to patient. She was very [employer] attorney.

## 2022-04-09 NOTE — Progress Notes (Unsigned)
CC: Hand and Feet Cramping  HPI:   [name] is a 61 y.o. female with a past medical history of hypertension, depression, prediabetes, and multiple PEs who presents for leg and hand cramping.  She was last seen at [name] on 1/17.    Past Medical History:  Diagnosis Date   Anemia 2009    Baseline hemoglobin at 11, secondary to iron deficiency   Arthritis    Breast mass 02/2008    Noted on mammogram March 11, 2008 3 cm simple cyst at 9 to 10:00 position of the right breast as well as multiple other smaller simple cyst, 2.6 cm mass at the 10:00 position of the right breast also representing complex cyst,   Deep vein thrombosis (DVT) ([facility]CC)     history of multiple  DVTs in 1989, 1989, 2004, on chronic anticoagulation with Coumadin   Diverticulosis    noted colonoscopy 2010   Eczema    Fibroid uterus  2001    status post total abdominal hysterectomy, right and left salpingo-oophorectomy , done by Dr. Jodi [name]   Heart murmur    as a child   Hypertension    PE (pulmonary embolism)     history of multiple PEs in 1984, 1989, 2004-  electronic data reviewed in [facility] and EMR and I was not able to find single CT angiogram that was positive for pulmonary embolus nor was I able to find Doppler studies that were positive for DVTs   [name]  August 2007    her biopsy results of the left breast excisional biopsy of mass, Tammy tumeor with physical borderline features, 2.6 cm, margins negative, fibrocystic change including duct ectasia, fibrosis, apocrine metaplasia and focal calcification     Review of Systems:    Reports cramping and spasms in hands and feet, headache Denies fever, nausea, vomiting, visual disturbances, syncope, photophobia, numbness, tingling   Physical Exam:  Vitals:   04/10/22 1427  BP: 124/73  Pulse: 65  Temp: 98.2 F (36.8 C)  TempSrc: Oral  SpO2: 100%  Weight: 239 lb 14.4 oz (108.8 kg)  Height: '5\' 11"'$  (1.803 m)    General:   awake and  alert, sitting comfortably in chair, cooperative, not in acute distress Skin:   warm and dry, intact without any obvious lesions or scars, no rashes or lesions  Head:   normocephalic and atraumatic, temporal artery pulse greater on left than right Eyes:   extraocular movements intact, conjunctivae pink, pupils round and reactive to light, visual fields intact Lungs:   normal respiratory effort, breathing unlabored, symmetrical chest rise, no crackles or wheezing Cardiac:   regular rate and rhythm, normal S1 and S2 Neurologic:   oriented to person-place-time, sensation to light touch intact in all four extremities, no facial droop Psychiatric:   mood and affect normal, intelligible speech    Assessment & Plan:   Essential hypertension Patient has a history of hypertension.  At her last visit in May, her amlodipine was decreased from 10 mg to 5 mg daily.  She has been taking amlodipine at the current 5 mg dose consistently.  She has not yet purchased a blood pressure cuff to check her values at home. She reports occasional lightheadedness, but has not had any syncopal episodes or falls.  Blood pressure today in clinic was 124/73.  -Hold amlodipine until next visit in 1 month while monitoring frequency of hand and feet cramping -Consider continuing amlodipine in favor of an ACE inhibitor or ARB  Limb cramps Patient has been experiencing cramping and spasms in her hands and feet for many years, though the frequency has recently increased.  The cramping occurs in episodes, in which her hands and feet become extremely painful and sometimes the pain will travel proximally toward her trunk.  During these episodes, her hands are stuck in a partially flexed position.  These episodes last for a couple minutes, but then return several times in short succession.  He has been unable to identify an obvious trigger such as stress, activity, or certain movements. She denies numbness, tingling, skin  discoloration, and weakness. She has been prescribed cyclobenzaprine multiple times over the last several years and finds this medication effective for these symptoms.  Also tried a topical cream, which has not been effective.  These episodes, she will eat salt or bananas, which seem to help a little bit.  The etiology of these cramps remains unclear.  May be related to electrolyte abnormalities.  Amlodipine could potentially be contributing as well.  -Hold amlodipine until next visit in 1 month while monitoring frequency of these cramping episodes -Order CMP and magnesium levels   Headache Patient reports an acute headache over the last 2 weeks.  She describes herself as "not a headache person" and has not experienced this type of headache before.  She describes it as a sharp stabbing sensation located on the left side on the top of her head.  These headaches are intermittent and lasts less than half of the day, though they disrupt her sleep. There is no obvious trigger and she denies any visual or auditory disturbances during or prior to the these headaches.  She also denies photophobia, tearing, and eye pain.  Has not tried any medications for the headache.  On exam, temple temporal artery pulse on the left is more prominent than on the right.  Visual fields were intact bilaterally.  She has some possible proximal muscle weakness as well. Differential includes tension and migrainous headache.  This would be an atypical presentation of migraines.  At this point, we cannot rule out giant cell arteritis.  -Order inflammatory markers, if elevated prescribe oral corticosteroid and proceed to temporal artery biopsy     See Encounters Tab for problem based charting.  Patient seen with Dr. Philipp Ovens

## 2022-04-10 ENCOUNTER — Ambulatory Visit (INDEPENDENT_AMBULATORY_CARE_PROVIDER_SITE_OTHER): Payer: Medicare Other | Admitting: Student

## 2022-04-10 VITALS — BP 124/73 | HR 65 | Temp 98.2°F | Ht 71.0 in | Wt 239.9 lb

## 2022-04-10 DIAGNOSIS — R519 Headache, unspecified: Secondary | ICD-10-CM | POA: Insufficient documentation

## 2022-04-10 DIAGNOSIS — I10 Essential (primary) hypertension: Secondary | ICD-10-CM | POA: Diagnosis not present

## 2022-04-10 DIAGNOSIS — R252 Cramp and spasm: Secondary | ICD-10-CM

## 2022-04-10 NOTE — Assessment & Plan Note (Signed)
Patient has been experiencing cramping and spasms in her hands and feet for many years, though the frequency has recently increased.  The cramping occurs in episodes, in which her hands and feet become extremely painful and sometimes the pain will travel proximally toward her trunk.  During these episodes, her hands are stuck in a partially flexed position.  These episodes last for a couple minutes, but then return several times in short succession.  He has been unable to identify an obvious trigger such as stress, activity, or certain movements. She denies numbness, tingling, skin discoloration, and weakness. She has been prescribed cyclobenzaprine multiple times over the last several years and finds this medication effective for these symptoms.  Also tried a topical cream, which has not been effective.  These episodes, she will eat salt or bananas, which seem to help a little bit.  The etiology of these cramps remains unclear.  May be related to electrolyte abnormalities.  Amlodipine could potentially be contributing as well.  -Hold amlodipine until next visit in 1 month while monitoring frequency of these cramping episodes -Order CMP and magnesium levels

## 2022-04-10 NOTE — Patient Instructions (Signed)
  Thank you, Ms.Dwyane Dee, for allowing [place] to provide your care today. Today we discussed . . .  > Cramps       - we have ordered some lab tests, which will help [place] identify the cause of your muscle cramps       - please stop your amlodipine until your next visit with [place] in about one month > Headache       - we have ordered some lab tests, which will help [place] identify the cause of your headache   I have ordered the following labs for you:  Lab Orders  No laboratory test(s) ordered today      Tests ordered today:  None   Referrals ordered today:   Referral Orders  No referral(s) requested today      I have ordered the following medication/changed the following medications:   Stop the following medications: There are no discontinued medications.   Start the following medications: No orders of the defined types were placed in this encounter.     Follow up:  1 month     Remember:  We will call you with your lab results. Please return to clinic in about one month and we will see you then!   Should you have any questions or concerns please call the internal medicine clinic at [phone].     Roswell Nickel, MD Anon Ra[facility]

## 2022-04-10 NOTE — Assessment & Plan Note (Signed)
Patient reports an acute headache over the last 2 weeks.  She describes herself as "not a headache person" and has not experienced this type of headache before.  She describes it as a sharp stabbing sensation located on the left side on the top of her head.  These headaches are intermittent and lasts less than half of the day, though they disrupt her sleep. There is no obvious trigger and she denies any visual or auditory disturbances during or prior to the these headaches.  She also denies photophobia, tearing, and eye pain.  Has not tried any medications for the headache.  On exam, temple temporal artery pulse on the left is more prominent than on the right.  Visual fields were intact bilaterally.  She has some possible proximal muscle weakness as well. Differential includes tension and migrainous headache.  This would be an atypical presentation of migraines.  At this point, we cannot rule out giant cell arteritis.  -Order inflammatory markers, if elevated prescribe oral corticosteroid and proceed to temporal artery biopsy

## 2022-04-10 NOTE — Assessment & Plan Note (Signed)
Patient has a history of hypertension.  At her last visit in May, her amlodipine was decreased from 10 mg to 5 mg daily.  She has been taking amlodipine at the current 5 mg dose consistently.  She has not yet purchased a blood pressure cuff to check her values at home. She reports occasional lightheadedness, but has not had any syncopal episodes or falls.  Blood pressure today in clinic was 124/73.  -Hold amlodipine until next visit in 1 month while monitoring frequency of hand and feet cramping -Consider continuing amlodipine in favor of an ACE inhibitor or ARB

## 2022-04-11 LAB — CMP14 + ANION GAP
ALT: 26 IU/L (ref 0–32)
AST: 17 IU/L (ref 0–40)
Albumin/Globulin Ratio: 1.8 (ref 1.2–2.2)
Albumin: 4.6 g/dL (ref 3.9–4.9)
Alkaline Phosphatase: 81 IU/L (ref 44–121)
Anion Gap: 14 mmol/L (ref 10.0–18.0)
BUN/Creatinine Ratio: 8 — ABNORMAL LOW (ref 12–28)
BUN: 8 mg/dL (ref 8–27)
Bilirubin Total: 0.2 mg/dL (ref 0.0–1.2)
CO2: 24 mmol/L (ref 20–29)
Calcium: 10.1 mg/dL (ref 8.7–10.3)
Chloride: 101 mmol/L (ref 96–106)
Creatinine, Ser: 1 mg/dL (ref 0.57–1.00)
Globulin, Total: 2.5 g/dL (ref 1.5–4.5)
Glucose: 79 mg/dL (ref 70–99)
Potassium: 4.6 mmol/L (ref 3.5–5.2)
Sodium: 139 mmol/L (ref 134–144)
Total Protein: 7.1 g/dL (ref 6.0–8.5)
eGFR: 63 mL/min/{1.73_m2} (ref >59)

## 2022-04-11 LAB — MAGNESIUM: Magnesium: 2.4 mg/dL — ABNORMAL HIGH (ref 1.6–2.3)

## 2022-04-11 LAB — C-REACTIVE PROTEIN: CRP: 1 mg/L (ref 0–10)

## 2022-04-11 LAB — SEDIMENTATION RATE: Sed Rate: 14 mm/hr (ref 0–40)

## 2022-04-12 NOTE — Progress Notes (Signed)
Internal Medicine Clinic Attending  I saw and evaluated the patient.  I personally confirmed the key portions of the history and exam documented by Dr. [name] and I reviewed pertinent patient test results.  The assessment, diagnosis, and plan were formulated together and I agree with the documentation in the resident's note.   Patient with new onset stabbing unilateral headache for 2-3 weeks. She denies vision changes and photophobia. Inflammatory markers checked during this visit are WNL, making GCA less likely. No history of migraines or similar headaches previously. Will proceed with Brain MRI for further work up.

## 2022-04-12 NOTE — Addendum Note (Signed)
Addended by: Serita Butcher on: 04/12/2022 11:02 AM   Modules accepted: Orders

## 2022-04-12 NOTE — Addendum Note (Signed)
Addended by: Serita Butcher on: 04/12/2022 10:16 AM   Modules accepted: Orders

## 2022-04-13 ENCOUNTER — Encounter
Payer: Medicare Other | Attending: Student in an Organized Health Care Education/Training Program | Admitting: Registered"

## 2022-04-17 ENCOUNTER — Telehealth: Payer: Self-pay | Admitting: Student

## 2022-04-17 NOTE — Telephone Encounter (Signed)
This patient was seen by the Red team on  04/10/2022 by Dr. [name].  This pt is sch for the following:  Date: 04/20/2022 Status: Sch  Time: 7:00 PM Length: 60  Visit Type: MR BRAIN [place] [[id number]] Copay: $0.00  Provider: [facility] Department: [facility]         The pt states she was to receive medication to be able to go through the MRI Machine .  Please advise

## 2022-04-18 ENCOUNTER — Other Ambulatory Visit: Payer: Self-pay | Admitting: Internal Medicine

## 2022-04-18 DIAGNOSIS — F419 Anxiety disorder, unspecified: Secondary | ICD-10-CM

## 2022-04-18 MED ORDER — LORAZEPAM 1 MG PO TABS: 1.0000 mg | ORAL_TABLET | Freq: Two times a day (BID) | ORAL | 0 refills | Status: AC

## 2022-04-20 ENCOUNTER — Ambulatory Visit (HOSPITAL_COMMUNITY)
Admission: RE | Admit: 2022-04-20 | Discharge: 2022-04-20 | Disposition: A | Discharge status: Home / Self Care | Payer: Medicare Other | Source: Ambulatory Visit | Attending: Internal Medicine | Admitting: Internal Medicine

## 2022-04-20 DIAGNOSIS — R519 Headache, unspecified: Secondary | ICD-10-CM | POA: Insufficient documentation

## 2022-04-20 MED ORDER — GADOBUTROL 1 MMOL/ML IV SOLN
10.0000 mL | Freq: Once | INTRAVENOUS | Status: AC | PRN
  Administered 2022-04-20: 10 mL via INTRAVENOUS

## 2022-04-30 ENCOUNTER — Telehealth: Payer: Self-pay

## 2022-04-30 NOTE — Telephone Encounter (Signed)
Patient called she is requesting a call back regarding her lab results

## 2022-05-01 ENCOUNTER — Telehealth (INDEPENDENT_AMBULATORY_CARE_PROVIDER_SITE_OTHER): Payer: Medicare Other | Admitting: Internal Medicine

## 2022-05-01 DIAGNOSIS — G43809 Other migraine, not intractable, without status migrainosus: Secondary | ICD-10-CM | POA: Diagnosis not present

## 2022-05-01 DIAGNOSIS — G43909 Migraine, unspecified, not intractable, without status migrainosus: Secondary | ICD-10-CM | POA: Insufficient documentation

## 2022-05-01 MED ORDER — IBUPROFEN 800 MG PO TABS: 800.0000 mg | ORAL_TABLET | Freq: Three times a day (TID) | ORAL | 0 refills | Status: DC | PRN

## 2022-05-01 MED ORDER — SUMATRIPTAN SUCCINATE 50 MG PO TABS: 50.0000 mg | ORAL_TABLET | ORAL | 0 refills | Status: DC | PRN

## 2022-05-01 NOTE — Telephone Encounter (Signed)
Returned call to patient. She was seen in 8/23 for new onset unilateral headaches. She states that she headache daily and they are making it difficult to sleep. She has been taking ibuprofen 800 mg with minimal relief. ESR, CRP were within normal limits. She completed MRI brain 9/1 and no abnormalities present. With Temporal arteritis and tumor ruled out, symptoms consistent with migraine.  On chart review she does have history of headache in 2015 that sound similar. A/P: Start sumatriptan. I talked with patient about NSAIDS possibly causing rebound headache and to limits use of these. We also talked about following up with Dr. Khan 9/22 to see if sumatriptan is helpful and talk about preventative therapy for migraine.  

## 2022-05-01 NOTE — Assessment & Plan Note (Signed)
New onset unilateral headache that started in Aug. Please see phone note 9/12 for further details. -start sumatriptan -follow-up Dr. Humphrey Rolls 9/22

## 2022-05-08 ENCOUNTER — Ambulatory Visit: Payer: Self-pay | Admitting: Licensed Clinical Social Worker

## 2022-05-08 NOTE — Patient Outreach (Signed)
  Care Coordination   05/08/2022 Name: [name] MRN: [id number] DOB: 1961/05/10   Care Coordination Outreach Attempts:  An unsuccessful telephone outreach was attempted today to offer the patient information about available care coordination services as a benefit of their health plan.   Follow Up Plan:  Additional outreach attempts will be made to offer the patient care coordination information and services.   Encounter Outcome:  No Answer  Care Coordination Interventions Activated:  No   Care Coordination Interventions:  No, not indicated   Lenor Derrick, MSW  Social Worker [facility] Care Management  [phone]

## 2022-05-09 NOTE — Telephone Encounter (Signed)
Internal Medicine Clinic Attending  Case discussed with the resident at the time of the visit.  We reviewed the resident's history and exam and pertinent patient test results.  I agree with the assessment, diagnosis, and plan of care documented in the resident's note.  

## 2022-05-11 ENCOUNTER — Encounter: Payer: Medicare Other | Admitting: Internal Medicine

## 2022-05-11 NOTE — Progress Notes (Deleted)
CC: 1 month follow up  HPI:  Ms.Cassanda R Glaus is a 61 y.o. with medical history of HTN, HLD, Prediabetes, and obesity presenting to [facility] for one month follow up.   Please see problem-based list for further details, assessments, and plans.  Past Medical History:  Diagnosis Date   Anemia 2009    Baseline hemoglobin at 11, secondary to iron deficiency   Arthritis    Breast mass 02/2008    Noted on mammogram March 11, 2008 3 cm simple cyst at 9 to 10:00 position of the right breast as well as multiple other smaller simple cyst, 2.6 cm mass at the 10:00 position of the right breast also representing complex cyst,   Deep vein thrombosis (DVT) ([place])     history of multiple  DVTs in 1989, 1989, 2004, on chronic anticoagulation with Coumadin   Diverticulosis    noted colonoscopy 2010   Eczema    Fibroid uterus  2001    status post total abdominal hysterectomy, right and left salpingo-oophorectomy , done by Dr. 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  Current Outpatient Medications (Cardiovascular):    amLODipine (NORVASC) 5 MG tablet, Take 1 tablet (5 mg total) by mouth daily.   rosuvastatin (CRESTOR) 20 MG tablet, TAKE 1 TABLET BY MOUTH EVERY DAY  Current Outpatient Medications (Respiratory):     albuterol (VENTOLIN HFA) 108 (90 Base) MCG/ACT inhaler, Inhale 1-2 puffs into the lungs every 6 (six) hours as needed for wheezing or shortness of breath.  Current Outpatient Medications (Analgesics):    ibuprofen (ADVIL) 800 MG tablet, Take 1 tablet (800 mg total) by mouth every 8 (eight) hours as needed.   SUMAtriptan (IMITREX) 50 MG tablet, Take 1 tablet (50 mg total) by mouth every 2 (two) hours as needed for migraine. May repeat in 2 hours if headache persists or recurs.   Current Outpatient Medications (Other):    Multiple Vitamin (MULTIVITAMIN) capsule, Take 1 capsule daily by mouth.  Review of Systems:  Review of system negative unless stated in the problem list or HPI.    Physical Exam:  There were no vitals filed for this visit.  Physical Exam General: NAD HENT: NCAT Lungs: CTAB, no wheeze, rhonchi or rales.  Cardiovascular: Normal heart sounds, no r/m/g, 2+ pulses in all extremities. No LE edema Abdomen: No TTP, normal bowel sounds MSK: No asymmetry or muscle atrophy.  Skin: no lesions noted on exposed skin Neuro: Alert and oriented x4. CN grossly intact Psych: Normal mood and normal affect   Assessment & Plan:   No problem-specific Assessment & Plan notes found for this encounter.   See Encounters Tab for problem based charting.  Patient discussed with Dr. {NAMES:[id number]::"Guilloud","Hoffman","Mullen","Narendra","Vincent","Machen","Lau","Hatcher"} Idamae Schuller, MD Tillie Rung.  [facility] Internal Medicine Residency, PGY-2   HTN Amlodipine held. Was instructed to monitor BP at home.    Headaches Negative inflammatory markers. Negative MRI. Started on sumitriptan on 09/12.   Hand and feet cramping.

## 2022-05-28 ENCOUNTER — Telehealth: Payer: Self-pay | Admitting: Student

## 2022-05-28 NOTE — Telephone Encounter (Addendum)
TC to patient for follow up of US of the right breast on 11/15/2020 with probable benign right breast mass. Have attempted to call patient regarding this and need for repeat diagnostic and mammogram and ultrasound. Unable to leave VM. Appears breast center has attempted to contact patient regarding this including 2 letter to patient on 11/15/2020 and 05/29/2021. Will need to discuss this at next appointment.

## 2022-05-28 NOTE — Telephone Encounter (Signed)
Opened in error

## 2022-05-29 ENCOUNTER — Ambulatory Visit: Payer: Self-pay

## 2022-05-29 NOTE — Patient Outreach (Signed)
  Care Coordination   05/29/2022 Name: DAAIYAH BAUMERT MRN: [id number] DOB: 1961/02/04   Care Coordination Outreach Attempts:  An unsuccessful telephone outreach was attempted today to offer the patient information about available care coordination services as a benefit of their health plan.   Follow Up Plan:  Additional outreach attempts will be made to offer the patient care coordination information and services.   Encounter Outcome:  No Answer  Care Coordination Interventions Activated:  No   Care Coordination Interventions:  No, not indicated    Johnney Killian, RN, BSN, CCM Care Management Coordinator [facility] Phone: [phone]: [phone]

## 2022-06-06 ENCOUNTER — Ambulatory Visit (INDEPENDENT_AMBULATORY_CARE_PROVIDER_SITE_OTHER): Payer: Medicare Other | Admitting: Family Medicine

## 2022-06-06 ENCOUNTER — Encounter: Payer: Self-pay | Admitting: Family Medicine

## 2022-06-06 VITALS — BP 131/79 | HR 70 | Ht 71.0 in | Wt 236.8 lb

## 2022-06-06 DIAGNOSIS — H9311 Tinnitus, right ear: Secondary | ICD-10-CM | POA: Diagnosis not present

## 2022-06-06 DIAGNOSIS — H6121 Impacted cerumen, right ear: Secondary | ICD-10-CM

## 2022-06-06 NOTE — Assessment & Plan Note (Signed)
Right ear canal impacted with cerumen.  Ear lavage was performed.  Cerumen completely removed.  TM translucent and intact. No injury to the ear canal or TM from irrigation. Pt tolerated ear lavage well.

## 2022-06-06 NOTE — Assessment & Plan Note (Addendum)
Patient reports ringing in the right ear for 2 to 3 weeks.  It is continuous, associated with decrease in hearing/muffled sound.  Patient denies pain, injury/trauma, drainage or vertigo.  Patient denies similar symptoms in the past.  Patient have not tried anything to help with her symptoms. Patient were recently worked up for new onset headache.  Lab work was within normal limits and MRI ruled out intracranial pathology.  Patient was diagnosed with migraine headaches.  She started on sumatriptan.  Patient stated she took the medication for few days and did not tolerated well.  She felt nauseous therefore she stopped taking the medication.  Patient reports her headache has improved.  She is taking Tylenol/ibuprofen as needed for headache. Physical exam revealed right ear canal is impacted with cerumen.  Tinnitus and muffled hearing is highly likely due to cerumen impaction.  I will really evaluate the patient after ear canal irrigation/cerumen disimpaction. Plan: Irrigation of the ear canal  Reevaluation post ear lavage: Cerumen completely removed.  TM translucent and intact.  Patient continued to experience tinnitus.  Since the physical exam is completely benign and the hearing has improved posterior lavage I will give few days and anticipate the tinnitus will resolve.  If patient continue to have tinnitus she will call the clinic and we will make a referral to ENT for further evaluation.  Patient agrees with the plan

## 2022-06-06 NOTE — Patient Instructions (Signed)
The cerumen is completely removed from the ear canal.  Your ear canal and eardrum looks good. If you continue to hear ringing in the right ear please call the clinic and we will make a referral to the ENT.

## 2022-06-06 NOTE — Progress Notes (Signed)
CC: Ringing in the right ear  HPI: [name] is a 61 y.o. female with past medical history listed below presenting to [facility] with chief complaints of ringing in the right. For details of today's visit and the status of his chronic medical issues please refer to the assessment and plan.   Patient reports ringing in the right ear for 2 to 3 weeks.  It is continuous, associated with decrease in hearing/muffled sound.  Patient denies pain, injury/trauma, drainage or vertigo.  Patient denies similar symptoms in the past.  Patient have not tried anything to help with her symptoms. Patient were recently worked up for new onset headache.  Lab work was within normal limits and MRI ruled out intracranial pathology.  Patient was diagnosed with migraine headaches.  She started on sumatriptan.  Patient stated she took the medication for few days and did not tolerated well.  She felt nauseous therefore she stopped taking the medication.  Patient reports her headache has improved.  She is taking Tylenol/ibuprofen as needed for headache.   Past Medical History:  Diagnosis Date  . Anemia 2009    Baseline hemoglobin at 11, secondary to iron deficiency  . Arthritis   . Breast mass 02/2008    Noted on mammogram March 11, 2008 3 cm simple cyst at 9 to 10:00 position of the right breast as well as multiple other smaller simple cyst, 2.6 cm mass at the 10:00 position of the right breast also representing complex cyst,  . Deep vein thrombosis (DVT) (HCC)     history of multiple  DVTs in 1989, 1989, 2004, on chronic anticoagulation with Coumadin  . Diverticulosis    noted colonoscopy 2010  . Eczema   . Fibroid uterus  2001    status post total abdominal hysterectomy, right and left salpingo-oophorectomy , done by Dr. [name]  . Heart murmur    as a child  . Hypertension   . PE (pulmonary embolism)     history of multiple PEs in 1984, 1989, 2004-  electronic data reviewed in [place] and EMR and I was not  able to find single CT angiogram that was positive for pulmonary embolus nor was I able to find Doppler studies that were positive for DVTs  . 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   HENT:  Positive for tinnitus.        Muffled hearing in the right ear  Neurological:  Negative for dizziness, speech change, focal weakness and headaches.     Physical Exam: Physical Exam Vitals and nursing note reviewed.  Constitutional:      Appearance: Normal appearance.  HENT:     Head: Normocephalic and atraumatic.     Jaw: No trismus, tenderness, swelling or pain on movement.     Comments: No tenderness to TMJ. No clicking or knocking.     Right Ear: No drainage, swelling or tenderness. There is impacted cerumen. No mastoid tenderness.     Left Ear: Tympanic membrane, ear canal and external ear normal. There is no impacted cerumen.     Ears:     Comments: No tragus tenderness or mastoid tenderness Neurological:     Mental Status: She is alert.     Vitals:  06/06/22 1031  BP: 131/79  Pulse: 70  SpO2: 100%  Weight: 236 lb 12.8 oz (107.4 kg)  Height: '5\' 11"'$  (1.803 m)     Assessment & Plan:   See Encounters Tab for problem based charting.  Patient seen with Dr. Evette Doffing

## 2022-06-07 NOTE — Progress Notes (Signed)
Internal Medicine Clinic Attending  I saw and evaluated the patient.  I personally confirmed the key portions of the history and exam documented by Dr. Jeanett Schlein and I reviewed pertinent patient test results.  The assessment, diagnosis, and plan were formulated together and I agree with the documentation in the resident's note.   I counseled the patient against the use of cue tips in the future.

## 2022-06-18 ENCOUNTER — Other Ambulatory Visit: Payer: Self-pay | Admitting: Student

## 2022-06-18 NOTE — Telephone Encounter (Signed)
Pt requesting a call back.  [facility] and [facility] are out of the following medication.   [facility]  Address: [address], [place], [place] [address] Phone: [phone]  [place], 2 MG/DOSE, 8 MG/3ML SOPN   [id number] [place]

## 2022-06-19 ENCOUNTER — Other Ambulatory Visit (HOSPITAL_COMMUNITY): Payer: Self-pay

## 2022-06-19 MED ORDER — OZEMPIC (2 MG/DOSE) 8 MG/3ML ~~LOC~~ SOPN
2.0000 mg | PEN_INJECTOR | SUBCUTANEOUS | 1 refills | Status: DC
  Filled 2022-06-19: qty 3, 28d supply, fill #0

## 2022-06-19 NOTE — Telephone Encounter (Signed)
I saw the [facility], does she want the Ozempic sent to [facility]?

## 2022-06-19 NOTE — Telephone Encounter (Signed)
This Rx was called to [place] at [facility]. Nothing more needed at this time.   Patient is wanting to know what to do next month if Ozempic remains on back order.

## 2022-06-19 NOTE — Telephone Encounter (Signed)
Sounds like she has enough for this month. Let me know if that is not correct. If she ozempic is on backorder next month we work with her to see if any other pharmacy has it in stock or discuss trying a different GLP-1.

## 2022-06-19 NOTE — Telephone Encounter (Signed)
Ozempic is on national BO. [facility] do not have any in stock. [name] at [facility] states she will most likely not have any in stock next month either. Patient is interested in discussing alternative med for next month so she does not miss any doses.

## 2022-06-19 NOTE — Telephone Encounter (Signed)
Patient called back stating [facility] does not have Ozempic in stock either. She will try [facility] and let us know.

## 2022-06-19 NOTE — Telephone Encounter (Signed)
[facility] does have Ozempic at this time but may not have refill next month 2/2 shortage. VO given to [facility] for 3 mL with 1 refill. Attempted to notify patient x 2. No answer and no VM set up.

## 2022-06-19 NOTE — Telephone Encounter (Signed)
Patient called back. Gave detailed directions to [facility]. She would like to know what med she can take next month to replace Ozempic if it remains on back order.

## 2022-06-21 ENCOUNTER — Other Ambulatory Visit: Payer: Self-pay | Admitting: Student

## 2022-06-22 ENCOUNTER — Other Ambulatory Visit (HOSPITAL_COMMUNITY): Payer: Self-pay

## 2022-06-22 NOTE — Telephone Encounter (Signed)
Call to [employer] [facility].  Patient picked up 3 days ago.

## 2022-06-28 NOTE — Telephone Encounter (Signed)
Patient given tele appt with PCP on 11/20 at 0845 to discuss alternatives to Ozempic.

## 2022-07-05 ENCOUNTER — Other Ambulatory Visit: Payer: Self-pay | Admitting: Student

## 2022-07-09 ENCOUNTER — Ambulatory Visit (INDEPENDENT_AMBULATORY_CARE_PROVIDER_SITE_OTHER): Payer: Medicare Other | Admitting: Student

## 2022-07-09 ENCOUNTER — Encounter: Payer: Self-pay | Admitting: Student

## 2022-07-09 DIAGNOSIS — R7303 Prediabetes: Secondary | ICD-10-CM | POA: Diagnosis not present

## 2022-07-09 MED ORDER — SEMAGLUTIDE (1 MG/DOSE) 4 MG/3ML ~~LOC~~ SOPN: 2.0000 mg | PEN_INJECTOR | SUBCUTANEOUS | 2 refills | Status: AC

## 2022-07-09 NOTE — Progress Notes (Signed)
  [facility] Internal Medicine Residency Telephone Encounter Continuity Care Appointment  HPI:  This telephone encounter was created for Ms. [name] on 07/09/2022 for the following purpose/cc prediabetes and obesity follow up.   Past Medical History:  Past Medical History:  Diagnosis Date   Anemia 2009    Baseline hemoglobin at 11, secondary to iron deficiency   Arthritis    Breast mass 02/2008    Noted on mammogram March 11, 2008 3 cm simple cyst at 9 to 10:00 position of the right breast as well as multiple other smaller simple cyst, 2.6 cm mass at the 10:00 position of the right breast also representing complex cyst,   Deep vein thrombosis (DVT) ([place])     history of multiple  DVTs in 1989, 1989, 2004, on chronic anticoagulation with Coumadin   Diverticulosis    noted colonoscopy 2010   Eczema    Fibroid uterus  2001    status post total abdominal hysterectomy, right and left salpingo-oophorectomy , done by Dr. 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  As always, pt is advised that if symptoms worsen or new symptoms arise, they should go to an urgent care facility or to to ER for further evaluation.   Consent and Medical  Decision Making:  Patient discussed with Dr. Angelia Mould This is a telephone encounter between [name] and Iona Beard on 07/09/2022 for difficulty obtaining ozempic for prediabetes and obesity. The visit was conducted with the patient located at home and Iona Beard at [facility] The patient's identity was confirmed using their DOB and current address. The patient has consented to being evaluated through a telephone encounter and understands the associated risks (an examination cannot be done and the patient may need to come in for an appointment) / benefits (allows the patient to remain at home, decreasing exposure to coronavirus). I personally spent 20 minutes on medical discussion.

## 2022-07-09 NOTE — Assessment & Plan Note (Signed)
Using ozempic 2 mg weekly. Having difficultly getting dose at her pharmacy. Notes they usually have '1mg'$  dose in stock and  she has given herself 1 injection of 1 mg in past and this has worked well. Will change her script to '1mg'$  pen but continue her on 2 mg week dose.

## 2022-07-11 NOTE — Progress Notes (Signed)
Internal Medicine Clinic Attending  Case discussed with the resident at the time of the visit.  We reviewed the resident's history and exam and pertinent patient test results.  I agree with the assessment, diagnosis, and plan of care documented in the resident's note.  

## 2022-07-22 ENCOUNTER — Other Ambulatory Visit: Payer: Self-pay | Admitting: Student

## 2022-07-23 NOTE — Telephone Encounter (Signed)
Pt is requesting a call back about her Medication that was called in  for her.  Semaglutide, 1 MG/DOSE, 4 MG/3ML SOPN

## 2022-07-23 NOTE — Telephone Encounter (Signed)
Call to Pharmacy patient still has a refill left on both the 1 and 2 mg doses.

## 2022-07-24 ENCOUNTER — Other Ambulatory Visit: Payer: Self-pay | Admitting: Student

## 2022-08-06 ENCOUNTER — Telehealth: Payer: Self-pay

## 2022-08-06 NOTE — Patient Outreach (Signed)
  Care Coordination   08/06/2022 Name: [name] MRN: [id number] DOB: 04-13-1961   Care Coordination Outreach Attempts:  An unsuccessful telephone outreach was attempted today to offer the patient information about available care coordination services as a benefit of their health plan.   Follow Up Plan:  Additional outreach attempts will be made to offer the patient care coordination information and services.   Encounter Outcome:  No Answer   Care Coordination Interventions:  No, not indicated    Jone Baseman, RN, MSN [place] [facility] Care Management Coordinator Direct Line [phone]

## 2022-08-06 NOTE — Patient Instructions (Signed)
Visit Information  Thank you for taking time to visit with me today. Please don't hesitate to contact me if I can be of assistance to you.   Following are the goals we discussed today:   Goals Addressed             This Visit's Progress    SCAT Transportation application       Care Coordination Interventions: Patient asking about additional transportation.  Discussed [employer] transportation benefit.  Will send SCAT information as well.            Our next appointment is by telephone on 08/21/22 at 1030  Please call the care guide team at [phone] if you need to cancel or reschedule your appointment.   If you are experiencing a Mental Health or [place] or need someone to talk to, please call the Suicide and Crisis Lifeline: 988   The patient verbalized understanding of instructions, educational materials, and care plan provided today and agreed to receive a mailed copy of patient instructions, educational materials, and care plan.    Jone Baseman, RN, MSN [place] [facility] Care Management Coordinator Direct Line [phone]

## 2022-08-06 NOTE — Patient Outreach (Signed)
  Care Coordination   Initial Visit Note   08/06/2022 Name: CLARABELLE OSCARSON MRN: [id number] DOB: February 01, 1961  JEYDI KLINGEL is a 61 y.o. year old female who sees Iona Beard, MD for primary care. I spoke with  Dwyane Dee by phone today.  What matters to the patients health and wellness today?  transportation    Goals Addressed             This Visit's Progress    SCAT Transportation application       Care Coordination Interventions: Patient asking about additional transportation.  Discussed [employer] transportation benefit.  Will send SCAT information as well.            SDOH assessments and interventions completed:  Yes  SDOH Interventions Today    Flowsheet Row Most Recent Value  SDOH Interventions   Transportation Interventions Intervention Not Indicated        Care Coordination Interventions:  Yes, provided   Follow up plan: Follow up call scheduled for 2 weeks    Encounter Outcome:  Pt. Visit Completed   Jone Baseman, RN, MSN [place] [facility] Care Management Coordinator Direct Line [phone]

## 2022-08-21 ENCOUNTER — Ambulatory Visit: Payer: Self-pay

## 2022-08-21 NOTE — Patient Outreach (Signed)
  Care Coordination   08/21/2022 Name: [name] MRN: [id number] DOB: 1960/09/16   Care Coordination Outreach Attempts:  An unsuccessful telephone outreach was attempted today to offer the patient information about available care coordination services as a benefit of their health plan.   Follow Up Plan:  Additional outreach attempts will be made to offer the patient care coordination information and services.   Encounter Outcome:  No Answer   Care Coordination Interventions:  No, not indicated    Jone Baseman, RN, MSN [place] [facility] Care Management Coordinator Direct Line [phone]

## 2022-09-03 ENCOUNTER — Ambulatory Visit: Payer: Self-pay

## 2022-09-03 NOTE — Patient Outreach (Signed)
  Care Coordination   09/03/2022 Name: [name] MRN: [id number] DOB: August 23, 1960   Care Coordination Outreach Attempts:  A second unsuccessful outreach was attempted today to offer the patient with information about available care coordination services as a benefit of their health plan.     Follow Up Plan:  Additional outreach attempts will be made to offer the patient care coordination information and services.   Encounter Outcome:  No Answer   Care Coordination Interventions:  No, not indicated    Jone Baseman, RN, MSN [place] Management Care Management Coordinator Direct Line [phone]

## 2022-09-06 ENCOUNTER — Other Ambulatory Visit: Payer: Self-pay | Admitting: Student

## 2022-09-07 ENCOUNTER — Other Ambulatory Visit: Payer: Self-pay

## 2022-09-07 NOTE — Telephone Encounter (Signed)
Error

## 2022-09-07 NOTE — Telephone Encounter (Signed)
Next appt scheduled 09/11/22 with Dr Eulas Post.

## 2022-09-11 ENCOUNTER — Ambulatory Visit (INDEPENDENT_AMBULATORY_CARE_PROVIDER_SITE_OTHER): Payer: 59 | Admitting: Student

## 2022-09-11 ENCOUNTER — Other Ambulatory Visit: Payer: Self-pay

## 2022-09-11 ENCOUNTER — Encounter: Payer: Self-pay | Admitting: Student

## 2022-09-11 VITALS — BP 125/73 | HR 69 | Temp 97.6°F | Ht 71.0 in | Wt 238.8 lb

## 2022-09-11 DIAGNOSIS — R0609 Other forms of dyspnea: Secondary | ICD-10-CM

## 2022-09-11 DIAGNOSIS — R7303 Prediabetes: Secondary | ICD-10-CM | POA: Diagnosis not present

## 2022-09-11 DIAGNOSIS — R0602 Shortness of breath: Secondary | ICD-10-CM | POA: Diagnosis not present

## 2022-09-11 DIAGNOSIS — G629 Polyneuropathy, unspecified: Secondary | ICD-10-CM | POA: Diagnosis not present

## 2022-09-11 DIAGNOSIS — R252 Cramp and spasm: Secondary | ICD-10-CM | POA: Diagnosis not present

## 2022-09-11 LAB — D-DIMER, QUANTITATIVE: D-Dimer, Quant: 0.35 ug/mL-FEU (ref 0.00–0.50)

## 2022-09-11 NOTE — Patient Instructions (Signed)
Please come back in 2 weeks for follow up of your acute problems as well as for your health maintenance.

## 2022-09-13 LAB — BASIC METABOLIC PANEL
BUN/Creatinine Ratio: 10 — ABNORMAL LOW (ref 12–28)
BUN: 8 mg/dL (ref 8–27)
CO2: 21 mmol/L (ref 20–29)
Calcium: 9.6 mg/dL (ref 8.7–10.3)
Chloride: 105 mmol/L (ref 96–106)
Creatinine, Ser: 0.83 mg/dL (ref 0.57–1.00)
Glucose: 82 mg/dL (ref 70–99)
Potassium: 4.2 mmol/L (ref 3.5–5.2)
Sodium: 142 mmol/L (ref 134–144)
eGFR: 79 mL/min/{1.73_m2} (ref >59)

## 2022-09-13 LAB — CBC
Hematocrit: 37.7 % (ref 34.0–46.6)
Hemoglobin: 12.6 g/dL (ref 11.1–15.9)
MCH: 28.9 pg (ref 26.6–33.0)
MCHC: 33.4 g/dL (ref 31.5–35.7)
MCV: 87 fL (ref 79–97)
Platelets: 261 10*3/uL (ref 150–450)
RBC: 4.36 x10E6/uL (ref 3.77–5.28)
RDW: 13.4 % (ref 11.7–15.4)
WBC: 3.9 10*3/uL (ref 3.4–10.8)

## 2022-09-13 LAB — HEMOGLOBIN A1C
Est. average glucose Bld gHb Est-mCnc: 117 mg/dL
Hgb A1c MFr Bld: 5.7 % — ABNORMAL HIGH (ref 4.8–5.6)

## 2022-09-13 LAB — MAGNESIUM: Magnesium: 2.2 mg/dL (ref 1.6–2.3)

## 2022-09-13 LAB — IRON,TIBC AND FERRITIN PANEL
Ferritin: 166 ng/mL — ABNORMAL HIGH (ref 15–150)
Iron Saturation: 32 % (ref 15–55)
Iron: 85 ug/dL (ref 27–139)
Total Iron Binding Capacity: 265 ug/dL (ref 250–450)
UIBC: 180 ug/dL (ref 118–369)

## 2022-09-13 MED ORDER — CYCLOBENZAPRINE HCL 5 MG PO TABS: 5.0000 mg | ORAL_TABLET | Freq: Every evening | ORAL | 0 refills | Status: DC | PRN

## 2022-09-13 NOTE — Progress Notes (Signed)
CC: f/u for SOB and BLE pain  HPI:  [name] is a 62 y.o. F with a PMH per below who presents for BLE cramping pain and dyspnea.   Patient was seen in our clinic 03/2022. During that clinic visit she noted that she had cramping pain and spasms in her hands and feet that had been intermittently been going on for several years. She also stated that the pain sometimes travels proximally to her arms . She denied weakness or paresthesias at that time. She had no obvious triggers for her symptoms including activity or specific maneuvers. Her potassium and magnesium levels were checked and noted to be WNL. She did note during that visit that muscle relaxants tend to help her pain.   Patient now states that for the past 4 to 5 months that she has been experiencing BLE cramping pain. She states that the pain can occur with walking or with lying down. She denies any discoloration in her LE, weakness, or dysesthesias. She did note that she has had 2 episodes where she feels she is off balance and that her legs felt like they gave out.   Other notable previous work up for this includes CK WNL, CRP WNL, ESR WNL, and TSH WNL 08/31/2021. She has had normal Mg and potassium 04/10/22. She had normal ABIs 03/2021.   Regarding patient's dyspnea, she states that for the past 18 years she has had shortness of breath. She notes she sometimes gets short of breath with walking and with getting dressed. She denies any rest dyspnea. She states this was worsened over the past 3 weeks because she was told that her breathing look labored. When this has happened in the past she has used an albuterol to great effect. She denies any fever, chills, chest pain, h/o heart problems, and has only had an occasional dry cough. Of note, she does not have a personal history of tobacco use, but her ex husband was a smoker and she was exposed for ~18 years.   Past Medical History:  Diagnosis Date   Anemia 2009    Baseline  hemoglobin at 11, secondary to iron deficiency   Arthritis    Breast mass 02/2008    Noted on mammogram March 11, 2008 3 cm simple cyst at 9 to 10:00 position of the right breast as well as multiple other smaller simple cyst, 2.6 cm mass at the 10:00 position of the right breast also representing complex cyst,   Deep vein thrombosis (DVT) ([place])     history of multiple  DVTs in 1989, 1989, 2004, on chronic anticoagulation with Coumadin   Diverticulosis    noted colonoscopy 2010   Eczema    Fibroid uterus  2001    status post total abdominal hysterectomy, right and left salpingo-oophorectomy , done by Dr. 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   Physical Exam:  Vitals:   09/11/22 0919  BP: 125/73  Pulse: 69  Temp: 97.6 F (36.4 C)  TempSrc: Oral  SpO2: 100%  Weight: 238 lb 12.8 oz (108.3 kg)  Height: '5\' 11"'$  (1.803 m)   Constitutional: Well-developed, well-nourished, and in no distress.  Cardiovascular: Normal rate, regular rhythm, intact distal pulses. No gallop and no friction rub.  No murmur heard. No lower extremity edema  Pulmonary: Non labored breathing on room air, no wheezing or rales  Abdominal: Soft. Normal bowel sounds. Non distended and non tender Musculoskeletal: Normal range of motion.        General: No tenderness or edema of BLE, no erythema of overlying skin of  BLE Neurological: Alert and oriented to person, place, and time. Non focal, 5/5 BLE strength, normal bulk and tone Skin: Skin is warm and dry.    Assessment & Plan:   See Encounters Tab for problem based charting.  Patient discussed with Dr.  Cain Sieve

## 2022-09-13 NOTE — Assessment & Plan Note (Signed)
Patient now reports cramping pain in the bilateral lower extremities only with associated balance issues, with no dysesthesias or weakness. She has had an extensive work up in the past that has been unrevealing.  CMP     Component Value Date/Time   NA 142 09/11/2022 1009   K 4.2 09/11/2022 1009   CL 105 09/11/2022 1009   CO2 21 09/11/2022 1009   GLUCOSE 82 09/11/2022 1009   GLUCOSE 92 01/30/2021 1939   BUN 8 09/11/2022 1009   CREATININE 0.83 09/11/2022 1009   CREATININE 0.93 02/12/2020 1529   CALCIUM 9.6 09/11/2022 1009   PROT 7.1 04/10/2022 1525   ALBUMIN 4.6 04/10/2022 1525   AST 17 04/10/2022 1525   ALT 26 04/10/2022 1525   ALKPHOS 81 04/10/2022 1525   BILITOT 0.2 04/10/2022 1525   GFRNONAA 56 (L) 01/30/2021 1939   GFRNONAA 73 12/30/2013 1715   GFRAA >60 11/24/2019 2026   GFRAA 84 12/30/2013 1715    CMP this clinic visit w/ no abnormalities. Magnesium level also within normal limits. She has no iron deficiency. The etiology of patient's symptoms remains unclear. She has no electrolyte abnormalities, no evidence of muscle breakdown, no evidence of vascular insufficiency on ABI 03/2021. She has responded well to muscle relaxants in the past. Will start patient on as needed nighttime flexeril to see if this helps her symptoms. She will follow up with [place] in 2 weeks.

## 2022-09-13 NOTE — Assessment & Plan Note (Addendum)
She continues to have dyspnea with exertion but no rest dyspnea. She notes her sob has responded to albuterol in the past and notes second hand exposure to tobacco smoke for 18 years. Because of this considered possible mild COPD. She has never had PFTs to confirms this, and CTA chest 2015 showed no evidence of emphysematous lung changes. Patient does not have any wheezing or rales on exam. She has no evidence of iron deficiency or anemia on labs this clinic visit ruling anemia as cause of her symptoms. Given her report of acute worsening of her symptoms also considered VTE given she has not been on her coumadin for years. Her d-dimer however was negative. Also considered CTEPH given she notes this first started in 2006 and her history of multiple PE s. Finally, considered HF but patient does not have evidence of hypervolemia on exam (no rales or BLE edema). Patient could also have ILD but she does not have any crackles on exam.  -Will obtain PFTs if these are unrevealing will proceed with V/Q scan to r/o CTEPH. Patient may need referral to pulmonology after this.

## 2022-09-14 ENCOUNTER — Telehealth: Payer: Self-pay

## 2022-09-14 NOTE — Telephone Encounter (Signed)
A Prior Authorization was initiated for this patients ozempic through CoverMyMeds.   Key: [name]

## 2022-09-14 NOTE — Progress Notes (Signed)
Internal Medicine Clinic Attending  Case discussed with Dr. Eulas Post  At the time of the visit.  We reviewed the resident's history and exam and pertinent patient test results.  I agree with the assessment, diagnosis, and plan of care documented in the resident's note.    With her bilateral leg cramping, dyspnea on exertion, and history of VTE, we obtained a D-dimer, which was normal, so I don't think acute VTE is causing any of her symptoms.  For her dyspnea, I agree that PFTs are the next best step, and Dr. Eulas Post has ordered these.

## 2022-09-17 NOTE — Telephone Encounter (Signed)
Prior Auth for patients medication OZEMPIC denied by [facility] MEDICARE via CoverMyMeds.   Reason: Ozempic Inj '8mg'$ /29m is not FDA approved for your medical condition(s): Prediabetes. These condition(s) are not supported by one of the accepted references. Therefore your drug is denied because it is not being used for a "medically accepted indication."  CoverMyMeds Key: [id number]

## 2022-09-24 ENCOUNTER — Telehealth: Payer: Self-pay

## 2022-09-24 ENCOUNTER — Encounter: Payer: 59 | Admitting: Internal Medicine

## 2022-09-24 ENCOUNTER — Encounter: Payer: Self-pay | Admitting: Student

## 2022-09-24 NOTE — Telephone Encounter (Signed)
Pt would like to know what should she do since ozempic has been denied. Please call pt back.

## 2022-09-24 NOTE — Telephone Encounter (Signed)
Patient called to follow up on ozempic [place].   Informed patient that medication was denied on her insurance and that her PCP was aware, also that it should be discussed at her upcoming appt. Informed patient that maybe wegovy or mounjaro would be covered for prediabetes/ weightloss. Pt upset because she doesn't understand what changed between now and last year. Suggested her she would have to follow up with insurance as well.  Dr. Lisabeth Devoid, If wegovy or mounjaro isnt covered for patient, she can attempt to enroll with [place] for ozempic. Shipment/enrollment decisions are about 4-6 weeks out right now.

## 2022-09-25 NOTE — Telephone Encounter (Signed)
Thanks you for discussing with the patient. We will see if insurance will cover anything for weight loss at her visit tomorrow.

## 2022-09-26 ENCOUNTER — Telehealth: Payer: Self-pay | Admitting: *Deleted

## 2022-09-26 ENCOUNTER — Encounter: Payer: Self-pay | Admitting: Internal Medicine

## 2022-09-26 ENCOUNTER — Ambulatory Visit (INDEPENDENT_AMBULATORY_CARE_PROVIDER_SITE_OTHER): Payer: 59 | Admitting: Internal Medicine

## 2022-09-26 ENCOUNTER — Ambulatory Visit: Payer: Self-pay

## 2022-09-26 VITALS — BP 125/87 | HR 68 | Temp 97.8°F | Ht 71.0 in | Wt 237.4 lb

## 2022-09-26 DIAGNOSIS — R0609 Other forms of dyspnea: Secondary | ICD-10-CM | POA: Diagnosis not present

## 2022-09-26 DIAGNOSIS — M25562 Pain in left knee: Secondary | ICD-10-CM

## 2022-09-26 DIAGNOSIS — M25561 Pain in right knee: Secondary | ICD-10-CM

## 2022-09-26 DIAGNOSIS — Z86718 Personal history of other venous thrombosis and embolism: Secondary | ICD-10-CM

## 2022-09-26 DIAGNOSIS — G8929 Other chronic pain: Secondary | ICD-10-CM

## 2022-09-26 DIAGNOSIS — R0600 Dyspnea, unspecified: Secondary | ICD-10-CM

## 2022-09-26 DIAGNOSIS — R252 Cramp and spasm: Secondary | ICD-10-CM

## 2022-09-26 DIAGNOSIS — R7303 Prediabetes: Secondary | ICD-10-CM

## 2022-09-26 DIAGNOSIS — F32A Depression, unspecified: Secondary | ICD-10-CM

## 2022-09-26 MED ORDER — METFORMIN HCL 500 MG PO TABS: 500.0000 mg | ORAL_TABLET | Freq: Every day | ORAL | 0 refills | Status: DC

## 2022-09-26 NOTE — Telephone Encounter (Signed)
I discussed that DPP4 would not be covered by medicare for indication of weight loss (covered for T2DM).  Medicare does not cover weight loss drugs.

## 2022-09-26 NOTE — Patient Outreach (Signed)
  Care Coordination   09/26/2022 Name: [name] MRN: [id number] DOB: May 22, 1961   Care Coordination Outreach Attempts:  A third unsuccessful outreach was attempted today to offer the patient with information about available care coordination services as a benefit of their health plan.   Follow Up Plan:  No further outreach attempts will be made at this time. We have been unable to contact the patient to offer or enroll patient in care coordination services  Encounter Outcome:  No Answer   Care Coordination Interventions:  No, not indicated    Jone Baseman, RN, MSN [place] Management Care Management Coordinator Direct Line [phone]

## 2022-09-26 NOTE — Progress Notes (Signed)
   CC: Breathing  HPI:[name] is a 62 y.o. female who presents for evaluation of breathing. Please see individual problem based A/P for details.   39 yof hx MO, HTN, migraines, depression, prior DVT/PE   Depression, PHQ-9: Based on the patients  [facility] Visit from 09/11/2022 in [place]  PHQ-9 Total Score 0      score we have .  Past Medical History:  Diagnosis Date   Anemia 2009    Baseline hemoglobin at 11, secondary to iron deficiency   Arthritis    Breast mass 02/2008    Noted on mammogram March 11, 2008 3 cm simple cyst at 9 to 10:00 position of the right breast as well as multiple other smaller simple cyst, 2.6 cm mass at the 10:00 position of the right breast also representing complex cyst,   Deep vein thrombosis (DVT) ([place])     history of multiple  DVTs in 1989, 1989, 2004, on chronic anticoagulation with Coumadin   Diverticulosis    noted colonoscopy 2010   Eczema    Fibroid uterus  2001    status post total abdominal hysterectomy, right and left salpingo-oophorectomy , done by Dr. 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    her biopsy results of the left breast excisional biopsy of mass, phyllodes tumeor with physical borderline features, 2.6 cm, margins negative, fibrocystic change including duct ectasia, fibrosis, apocrine metaplasia and focal calcification   Review of Systems:   See hpi  Physical Exam: Vitals:   09/26/22 0920 09/26/22 1025  BP: (!) 140/94 125/87  Pulse: 66 68  Temp: 97.8 F (36.6 C)   TempSrc: Oral   SpO2: 99%   Weight: 237 lb 6.4 oz (107.7 kg)   Height: '5\' 11"'$  (1.803 m)     General: nad HEENT: Conjunctiva nl , antiicteric sclerae, moist mucous membranes, no exudate or erythema Cardiovascular: Normal rate, regular rhythm.  No murmurs, rubs, or gallops Pulmonary : Equal breath sounds, No wheezes, rales, or rhonchi Abdominal: soft, nontender,  bowel sounds present Ext: No edema in lower extremities, tenderness to palpation of lower extremities and calves  Assessment & Plan:   See Encounters Tab for problem based charting.  Patient discussed with Dr. Angelia Mould

## 2022-09-26 NOTE — Telephone Encounter (Signed)
Received conference call from patient and Bonnita Nasuti with [facility]. Ozempic and Mancel Parsons are not covered by her insurance. Provider's OV note from today states would consider DPP4 next.  Onglyza and Nesina both require PAs  Tradjenta and Januvia are both covered.   Patient requests a call back in regards to prescribing one of these meds. She is most interested in curbing appetite.

## 2022-09-26 NOTE — Patient Instructions (Addendum)
Dear Mrs. Gay Filler,  Thank you for trusting [place] with your care.  We discussed your breathing, pain, and weight management.  For your breathing, we would like for you to get the echocardiogram. Based off this, we will make a decision regarding anticoagulation. Please also follow up with the pulmonary function testing.  For your leg pain, we recommend voltaren gel. We will refer your to orthopedic surgery. We would also like to get an ultrasound of your lower legs.  For your weight, we will refer you to our diabetic/diet educator. We will also start metformin '500mg'$  daily to stabilize weight. We are also referring you to our therapist Milus Height to talk about this as well.   Please follow up in 1 month.

## 2022-09-28 ENCOUNTER — Encounter: Payer: Self-pay | Admitting: Internal Medicine

## 2022-09-28 NOTE — Assessment & Plan Note (Signed)
Patient reports bilateral pain in her legs that she describes as a charley horse, and pain in her shoulders which she describes as sore.  She does endorse that the back of her legs will hurt worse when lying down, not worsened with standing or with exercise.  No numbness tingling.  Squeezing her legs does cause discomfort.  She also reports history of arthritis in her knees for which she previously followed with Ortho.  Discomfort with compression of calves bilaterally.  No overt leg swelling.  Given her history of DVT and tenderness to calf compression will evaluate for DVT with lower extremity ultrasound.  Her arthritis may also be contributing to leg pain so we will refer her back to Ortho as well as physical therapy to help.  Advised Voltaren to the knees.  If workup is unrevealing given her soreness in her shoulders can consider checking ESR and CRP.

## 2022-09-28 NOTE — Assessment & Plan Note (Signed)
Patient previously on Ozempic for weight loss.  No longer approved and she has not been able to take it.  She says she feels very anxious about this.  Describes feeling addicted to food and is afraid she is going to begin binge eating again.  Although Mancel Parsons is approved for weight loss it is not covered through her Medicare.  To help with weight loss we will try metformin to hopefully stabilize weight.  She will also meet with Butch Penny for diet education and will meet with behavioral health to discuss appropriate coping mechanisms for her binge eating.

## 2022-09-28 NOTE — Assessment & Plan Note (Signed)
This is been an issue for patient for several years, but she states that it acutely worsened a couple months ago.  She describes breathing extra heavy, notices that she will get out of breath more often citing walking down the hallway will wear out.  She does think her breathing gets heavier when she lies flat and notices change in breathing predominantly at night.  Does note nonproductive cough that has been ongoing since exacerbation, but denies viral URI symptoms.  PFTs were ordered at last visit patient have not been scheduled yet.  Heart and lungs clear on exam no murmurs rubs or gallops.  Patient with a history of DVT and PE.  Old CTA from 2019 suggestive of PAH.  It is possible that she has CTEPH which we will evaluate with TTE.  Not currently on anticoagulation stating she did not like being on warfarin.  Discussed DOAC's, but patient prefers to wait till echo.  Patient to follow-up on PFTs.

## 2022-09-30 ENCOUNTER — Other Ambulatory Visit: Payer: Self-pay | Admitting: Student

## 2022-10-03 NOTE — Progress Notes (Signed)
Internal Medicine Clinic Attending  Case discussed with the resident at the time of the visit.  We reviewed the resident's history and exam and pertinent patient test results.  I agree with the assessment, diagnosis, and plan of care documented in the resident's note.  

## 2022-10-09 ENCOUNTER — Telehealth (INDEPENDENT_AMBULATORY_CARE_PROVIDER_SITE_OTHER): Payer: 59 | Admitting: Licensed Clinical Social Worker

## 2022-10-09 ENCOUNTER — Ambulatory Visit (INDEPENDENT_AMBULATORY_CARE_PROVIDER_SITE_OTHER): Payer: 59 | Admitting: Licensed Clinical Social Worker

## 2022-10-09 DIAGNOSIS — F32A Depression, unspecified: Secondary | ICD-10-CM

## 2022-10-09 NOTE — BH Specialist Note (Signed)
Integrated Behavioral Health via Telemedicine Visit  10/09/2022 [name] [id number]  Number of [place] Clinician visits: 1- Initial Visit  Session Start time: 0900   Session End time: 0931  Total time in minutes: 31   Referring Provider: Charise Killian Patient/Family location: Home [facility] Provider location: Office All persons participating in visit: Patient and [facility] Types of Service: Individual psychotherapy and [place] (BHI)  I connected with [name]  via  Telephone or Video Enabled Telemedicine Application  (Video is Caregility application) and verified that I am speaking with the correct person using two identifiers. Discussed confidentiality: Yes   I discussed the limitations of telemedicine and the availability of in person appointments.  Discussed there is a possibility of technology failure and discussed alternative modes of communication if that failure occurs.  I discussed that engaging in this telemedicine visit, they consent to the provision of behavioral healthcare.  Patient and/or legal guardian expressed understanding and consented to Telemedicine visit: Yes   Presenting Concerns: Patient and/or family reports the following symptoms/concerns: Emotional Eating Duration of problem: Years; Severity of problem: severe  Patient and/or Family's Strengths/Protective Factors: Social connections  Goals Addressed: Patient will:  Reduce symptoms of: compulsions   Progress towards Goals: Ongoing  Interventions: Interventions utilized:  Solution-Focused Strategies, Mindfulness or Relaxation Training, and Supportive Counseling Standardized Assessments completed: PHQ-SADS    09/11/2022    9:23 AM 08/06/2022    1:21 PM 06/06/2022   10:34 AM  PHQ-SADS Last 3 Score only  PHQ Adolescent Score 0 0 0    [facility] unable to complete assessment at time of visit.   Assessment: Patient currently experiencing  overeating. Patient states she emotional eats as a form of treatment. [facility] and patient discussed medication to help with overeating. [facility] recommended patient meditate prior to choosing to over eat. Patient agreed when emotional she will first listen to a song of choice and meditate. Patient advised she enjoys the gym, traveling and music.  Patient is visiting [place] in [place] and [place] in the future. Patient enjoys the gym and wants to learn to skate and swim. [place] discussed silver sneakers. Patient has family support and a dog- Robbie.   Patient may benefit from Solution-Focused Strategies, Mindfulness or Relaxation Training, and Supportive Counseling.  Plan: Follow up with behavioral health clinician on : Patient will schedule next visit within 30 days.  I discussed the assessment and treatment plan with the patient and/or parent/guardian. They were provided an opportunity to ask questions and all were answered. They agreed with the plan and demonstrated an understanding of the instructions.   They were advised to call back or seek an in-person evaluation if the symptoms worsen or if the condition fails to improve as anticipated. Milus Height, MSW, [place]  [facility] Direct [phone]  Fax [phone] Main Office Phone: [phone] [address] [place], [place] [address] Website: [place], [place]

## 2022-10-12 ENCOUNTER — Other Ambulatory Visit: Payer: Self-pay | Admitting: Student

## 2022-10-12 DIAGNOSIS — R252 Cramp and spasm: Secondary | ICD-10-CM

## 2022-10-15 ENCOUNTER — Ambulatory Visit: Payer: 59 | Admitting: Licensed Clinical Social Worker

## 2022-10-15 NOTE — Progress Notes (Deleted)
[facility] attempted patient at 1:38 Pm to inquire if coming to scheduled appointment. VM is not set up and [facility] unable to leave VM.   Milus Height, MSW, [place]  [facility] Direct Dial:[phone]  Fax [phone] Main Office Phone: [phone] [address] [place], [place] [address] Website: [place], [place]

## 2022-10-18 ENCOUNTER — Ambulatory Visit: Payer: 59 | Admitting: Orthopaedic Surgery

## 2022-10-22 ENCOUNTER — Other Ambulatory Visit: Payer: Self-pay | Admitting: Student

## 2022-10-22 ENCOUNTER — Telehealth: Payer: Self-pay

## 2022-10-22 DIAGNOSIS — R252 Cramp and spasm: Secondary | ICD-10-CM

## 2022-10-22 NOTE — Telephone Encounter (Signed)
Prior Authorization for patient (ozempic) came through on cover my meds was submitted..  We are unable to process your request for prior authorization for OZEMPIC INJ '8MG'$ /3ML for the above member due to [employer] has a denied request on file for [place] '8MG'$ /3ML for this member. Please refer to the appeals process outlined in the original denial or contact Prior Authorization Department at [phone] for further questions.

## 2022-10-25 ENCOUNTER — Ambulatory Visit (HOSPITAL_COMMUNITY)
Admission: RE | Admit: 2022-10-25 | Discharge: 2022-10-25 | Disposition: A | Discharge status: Home / Self Care | Payer: 59 | Source: Ambulatory Visit | Attending: Internal Medicine | Admitting: Internal Medicine

## 2022-10-25 ENCOUNTER — Ambulatory Visit (HOSPITAL_BASED_OUTPATIENT_CLINIC_OR_DEPARTMENT_OTHER)
Admission: RE | Admit: 2022-10-25 | Discharge: 2022-10-25 | Disposition: A | Discharge status: Home / Self Care | Payer: 59 | Source: Ambulatory Visit | Attending: Internal Medicine | Admitting: Internal Medicine

## 2022-10-25 DIAGNOSIS — R0600 Dyspnea, unspecified: Secondary | ICD-10-CM

## 2022-10-25 DIAGNOSIS — R0609 Other forms of dyspnea: Secondary | ICD-10-CM | POA: Diagnosis not present

## 2022-10-25 DIAGNOSIS — Z86718 Personal history of other venous thrombosis and embolism: Secondary | ICD-10-CM

## 2022-10-25 DIAGNOSIS — I10 Essential (primary) hypertension: Secondary | ICD-10-CM | POA: Diagnosis not present

## 2022-10-25 LAB — ECHOCARDIOGRAM COMPLETE
Area-P 1/2: 3.99 cm2
Calc EF: 52.9 %
S' Lateral: 3.6 cm
Single Plane A2C EF: 53.2 %
Single Plane A4C EF: 53 %

## 2022-10-25 NOTE — Progress Notes (Signed)
Echocardiogram 2D Echocardiogram has been performed.  [name] 10/25/2022, 3:44 PM

## 2022-10-25 NOTE — Progress Notes (Signed)
Bilateral lower extremity venous study completed.   Please see CV Procedures for preliminary results.  Pammy Vesey, RVT  2:37 PM 10/25/22

## 2022-10-25 NOTE — Therapy (Deleted)
OUTPATIENT PHYSICAL THERAPY LOWER EXTREMITY EVALUATION   Patient Name: [name] MRN: [id number] DOB:10-05-1960, 62 y.o., female Today's Date: 10/25/2022  END OF SESSION:   Past Medical History:  Diagnosis Date   Anemia 2009    Baseline hemoglobin at 11, secondary to iron deficiency   Arthritis    Breast mass 02/2008    Noted on mammogram March 11, 2008 3 cm simple cyst at 9 to 10:00 position of the right breast as well as multiple other smaller simple cyst, 2.6 cm mass at the 10:00 position of the right breast also representing complex cyst,   Deep vein thrombosis (DVT) ([place])     history of multiple  DVTs in 1989, 1989, 2004, on chronic anticoagulation with Coumadin   Diverticulosis    noted colonoscopy 2010   Eczema    Fibroid uterus  2001    status post total abdominal hysterectomy, right and left salpingo-oophorectomy , done by Dr. 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 [name]   BREAST LUMPECTOMY WITH RADIOACTIVE SEED LOCALIZATION Right 07/09/2017   Procedure: RIGHT BREAST LUMPECTOMY WITH RADIOACTIVE SEED LOCALIZATION;  Surgeon: Erroll Luna, MD;  Location: [facility];  Service: General;  Laterality: Right;   [place], PARTIAL  03/2006    left partial mastectomy  secondary to fibrocystic disease intraluminal fibroadenoma performed March 28, 2006 done by Dr. [name]   Patient Active Problem List   Diagnosis Date Noted   Tinnitus of right ear 06/06/2022   Hearing loss due to cerumen impaction, right 06/06/2022   Migraines 05/01/2022   Headache 04/10/2022   Prediabetes 01/05/2022   Hematochezia 01/05/2022   Back pain 09/04/2021   Limb cramps 03/30/2021   Atypical chest pain 02/14/2021   DOE (dyspnea on exertion) 02/04/2020   Shoulder impingement syndrome, right 02/04/2020   Depression 05/16/2018   Neuropathy 01/28/2018   Obesity, morbid ([place]) 09/28/2016   Hyperlipidemia 01/27/2014   Headache(784.0) 09/29/2013   Bilateral knee pain 05/25/2013   Eczema 05/12/2013   Insomnia 05/12/2013   Diverticulosis 04/18/2012   Healthcare maintenance 02/07/2011   Rash 11/13/2007   Upper airway cough syndrome 12/03/2006   Essential hypertension 05/29/2006   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    PCP: Iona Beard, MD   REFERRING PROVIDER: Charise Killian, MD  REFERRING DIAG: M25.561,M25.562,G89.29 (ICD-10-CM) - Chronic pain of both knees R25.2 (ICD-10-CM) - Limb cramps  THERAPY DIAG:  No diagnosis found.  Rationale for Evaluation and Treatment: Rehabilitation  ONSET DATE: chronic  SUBJECTIVE:   SUBJECTIVE STATEMENT: Patient reports bilateral pain in her legs that she describes as a charley horse, and pain in her shoulders which she describes as sore.  She does endorse that the back of her legs will hurt worse when  lying down, not worsened with standing or with exercise.  No numbness tingling.  Squeezing her legs does cause discomfort.  She also reports history of arthritis in her knees for which she previously followed with Ortho.   Discomfort with compression of calves bilaterally.  No overt leg swelling.   Given her history of DVT and tenderness to calf compression will evaluate for DVT with lower extremity ultrasound.  Her arthritis may  also be contributing to leg pain so we will refer her back to Ortho as well as physical therapy to help.  Advised Voltaren to the knees.  If workup is unrevealing given her soreness in her shoulders can consider checking ESR and CRP.    PERTINENT HISTORY: Patient now states that for the past 4 to 5 months that she has been experiencing BLE cramping pain. She states that the pain can occur with walking or with lying down. She denies any discoloration in her LE, weakness, or dysesthesias. She did note that she has had 2 episodes where she feels she is off balance and that her legs felt like they gave out.    Other notable previous work up for this includes CK WNL, CRP WNL, ESR WNL, and TSH WNL 08/31/2021. She has had normal Mg and potassium 04/10/22. She had normal ABIs 03/2021.  PAIN:  Are you having pain? {OPRCPAIN:27236}  PRECAUTIONS: Fall  WEIGHT BEARING RESTRICTIONS: No  FALLS:  Has patient fallen in last 6 months? No  LIVING ENVIRONMENT: Lives with: {OPRC lives with:25569::"lives with their family"} Lives in: {Lives in:25570} Stairs: {opstairs:27293} Has following equipment at home: {Assistive devices:23999}  OCCUPATION: ***  PLOF: Independent  PATIENT GOALS: ***  NEXT MD VISIT: ***  OBJECTIVE:   DIAGNOSTIC FINDINGS: none  PATIENT SURVEYS:  FOTO ***  MUSCLE LENGTH: Hamstrings: Right *** deg; Left *** deg Thomas test: Right *** deg; Left *** deg  POSTURE: {posture:25561}  PALPATION: ***  LOWER EXTREMITY ROM:  {AROM/PROM:27142} ROM Right eval Left eval  Hip flexion    Hip extension    Hip abduction    Hip adduction    Hip internal rotation    Hip external rotation    Knee flexion    Knee extension    Ankle dorsiflexion    Ankle plantarflexion    Ankle inversion    Ankle eversion     (Blank rows = not tested)  LOWER EXTREMITY MMT:  MMT Right eval Left eval  Hip flexion    Hip extension    Hip abduction    Hip adduction    Hip internal rotation     Hip external rotation    Knee flexion    Knee extension    Ankle dorsiflexion    Ankle plantarflexion    Ankle inversion    Ankle eversion     (Blank rows = not tested)  LOWER EXTREMITY SPECIAL TESTS:  {LEspecialtests:26242}  FUNCTIONAL TESTS:  {Functional tests:24029}  GAIT: Distance walked: 31fx2 Assistive device utilized: {Assistive devices:23999} Level of assistance: {Levels of assistance:24026} Comments: ***   TODAY'S TREATMENT:  DATE: ***    PATIENT EDUCATION:  Education details: Discussed eval findings, rehab rationale and POC and patient is in agreement  Person educated: Patient Education method: Explanation Education comprehension: verbalized understanding and needs further education  HOME EXERCISE PROGRAM: ***  ASSESSMENT:  CLINICAL IMPRESSION: Patient is a *** y.o. *** who was seen today for physical therapy evaluation and treatment for ***.   OBJECTIVE IMPAIRMENTS: {opptimpairments:25111}.   ACTIVITY LIMITATIONS: {activitylimitations:27494}  PARTICIPATION LIMITATIONS: {participationrestrictions:25113}  PERSONAL FACTORS: {Personal factors:25162} are also affecting patient's functional outcome.   REHAB POTENTIAL: {rehabpotential:25112}  CLINICAL DECISION MAKING: {clinical decision making:25114}  EVALUATION COMPLEXITY: {Evaluation complexity:25115}   GOALS: Goals reviewed with patient? {yes/no:20286}  SHORT TERM GOALS: Target date: *** *** Baseline: Goal status: {GOALSTATUS:25110}  2.  *** Baseline:  Goal status: {GOALSTATUS:25110}  3.  *** Baseline:  Goal status: {GOALSTATUS:25110}  4.  *** Baseline:  Goal status: {GOALSTATUS:25110}  5.  *** Baseline:  Goal status: {GOALSTATUS:25110}  6.  *** Baseline:  Goal status: {GOALSTATUS:25110}  LONG TERM GOALS: Target date: ***  *** Baseline:  Goal  status: {GOALSTATUS:25110}  2.  *** Baseline:  Goal status: {GOALSTATUS:25110}  3.  *** Baseline:  Goal status: {GOALSTATUS:25110}  4.  *** Baseline:  Goal status: {GOALSTATUS:25110}  5.  *** Baseline:  Goal status: {GOALSTATUS:25110}  6.  *** Baseline:  Goal status: {GOALSTATUS:25110}   PLAN:  PT FREQUENCY: 2x/week  PT DURATION: 6 weeks  PLANNED INTERVENTIONS: Therapeutic exercises, Therapeutic activity, Neuromuscular re-education, Balance training, Gait training, Patient/Family education, Self Care, Joint mobilization, Stair training, DME instructions, Dry Needling, Manual therapy, and Re-evaluation  PLAN FOR NEXT SESSION: ***   Lanice Shirts, PT 10/25/2022, 10:49 AM

## 2022-10-26 ENCOUNTER — Ambulatory Visit: Payer: 59

## 2022-10-26 ENCOUNTER — Ambulatory Visit: Payer: 59 | Admitting: Orthopaedic Surgery

## 2022-10-29 ENCOUNTER — Other Ambulatory Visit: Payer: Self-pay | Admitting: Internal Medicine

## 2022-10-29 DIAGNOSIS — R252 Cramp and spasm: Secondary | ICD-10-CM

## 2022-10-30 NOTE — Telephone Encounter (Signed)
Call to Pharmacy previous prescription sent by Dr. Humphrey Rolls on 10/23/2022. Was not received.  Please resend prescription for Flexeril.

## 2022-11-05 NOTE — Telephone Encounter (Signed)
Another pa for pt [facility] )  came through and it was denied  due to being  denied before

## 2022-11-06 NOTE — BH Specialist Note (Signed)
Duplicate Appt.

## 2022-11-14 ENCOUNTER — Ambulatory Visit: Payer: 59 | Admitting: Physical Therapy

## 2022-11-19 ENCOUNTER — Ambulatory Visit
Admission: EM | Admit: 2022-11-19 | Discharge: 2022-11-19 | Disposition: A | Discharge status: Home / Self Care | Payer: 59 | Attending: Urgent Care | Admitting: Urgent Care

## 2022-11-19 ENCOUNTER — Encounter (HOSPITAL_BASED_OUTPATIENT_CLINIC_OR_DEPARTMENT_OTHER): Payer: Self-pay | Admitting: Emergency Medicine

## 2022-11-19 ENCOUNTER — Emergency Department (HOSPITAL_BASED_OUTPATIENT_CLINIC_OR_DEPARTMENT_OTHER): Payer: 59

## 2022-11-19 ENCOUNTER — Other Ambulatory Visit: Payer: Self-pay

## 2022-11-19 ENCOUNTER — Inpatient Hospital Stay (HOSPITAL_BASED_OUTPATIENT_CLINIC_OR_DEPARTMENT_OTHER)
Admission: EM | Admit: 2022-11-19 | Discharge: 2022-11-22 | DRG: 948 | Disposition: A | Discharge status: Home / Self Care | Payer: 59 | Attending: Internal Medicine | Admitting: Internal Medicine

## 2022-11-19 DIAGNOSIS — M5412 Radiculopathy, cervical region: Secondary | ICD-10-CM | POA: Diagnosis present

## 2022-11-19 DIAGNOSIS — I10 Essential (primary) hypertension: Secondary | ICD-10-CM | POA: Diagnosis not present

## 2022-11-19 DIAGNOSIS — Z79899 Other long term (current) drug therapy: Secondary | ICD-10-CM

## 2022-11-19 DIAGNOSIS — I639 Cerebral infarction, unspecified: Secondary | ICD-10-CM | POA: Diagnosis not present

## 2022-11-19 DIAGNOSIS — Z885 Allergy status to narcotic agent status: Secondary | ICD-10-CM | POA: Diagnosis not present

## 2022-11-19 DIAGNOSIS — R29898 Other symptoms and signs involving the musculoskeletal system: Principal | ICD-10-CM

## 2022-11-19 DIAGNOSIS — R531 Weakness: Principal | ICD-10-CM | POA: Diagnosis present

## 2022-11-19 DIAGNOSIS — E6609 Other obesity due to excess calories: Secondary | ICD-10-CM | POA: Diagnosis not present

## 2022-11-19 DIAGNOSIS — Z823 Family history of stroke: Secondary | ICD-10-CM

## 2022-11-19 DIAGNOSIS — Z86718 Personal history of other venous thrombosis and embolism: Secondary | ICD-10-CM

## 2022-11-19 DIAGNOSIS — Z9012 Acquired absence of left breast and nipple: Secondary | ICD-10-CM | POA: Diagnosis not present

## 2022-11-19 DIAGNOSIS — Z7985 Long-term (current) use of injectable non-insulin antidiabetic drugs: Secondary | ICD-10-CM | POA: Diagnosis not present

## 2022-11-19 DIAGNOSIS — R2990 Unspecified symptoms and signs involving the nervous system: Secondary | ICD-10-CM | POA: Diagnosis not present

## 2022-11-19 DIAGNOSIS — H538 Other visual disturbances: Secondary | ICD-10-CM | POA: Diagnosis not present

## 2022-11-19 DIAGNOSIS — Z809 Family history of malignant neoplasm, unspecified: Secondary | ICD-10-CM

## 2022-11-19 DIAGNOSIS — E785 Hyperlipidemia, unspecified: Secondary | ICD-10-CM | POA: Diagnosis present

## 2022-11-19 DIAGNOSIS — Z86711 Personal history of pulmonary embolism: Secondary | ICD-10-CM | POA: Diagnosis not present

## 2022-11-19 DIAGNOSIS — M2578 Osteophyte, vertebrae: Secondary | ICD-10-CM | POA: Diagnosis not present

## 2022-11-19 DIAGNOSIS — Z7984 Long term (current) use of oral hypoglycemic drugs: Secondary | ICD-10-CM | POA: Diagnosis not present

## 2022-11-19 DIAGNOSIS — Z6834 Body mass index (BMI) 34.0-34.9, adult: Secondary | ICD-10-CM

## 2022-11-19 DIAGNOSIS — Z87891 Personal history of nicotine dependence: Secondary | ICD-10-CM

## 2022-11-19 DIAGNOSIS — R519 Headache, unspecified: Secondary | ICD-10-CM

## 2022-11-19 DIAGNOSIS — Z832 Family history of diseases of the blood and blood-forming organs and certain disorders involving the immune mechanism: Secondary | ICD-10-CM | POA: Diagnosis not present

## 2022-11-19 DIAGNOSIS — R29818 Other symptoms and signs involving the nervous system: Secondary | ICD-10-CM | POA: Diagnosis not present

## 2022-11-19 DIAGNOSIS — E119 Type 2 diabetes mellitus without complications: Secondary | ICD-10-CM | POA: Diagnosis present

## 2022-11-19 DIAGNOSIS — Z789 Other specified health status: Secondary | ICD-10-CM | POA: Clinically undetermined

## 2022-11-19 LAB — COMPREHENSIVE METABOLIC PANEL
ALT: 37 U/L (ref 0–44)
AST: 31 U/L (ref 15–41)
Albumin: 4 g/dL (ref 3.5–5.0)
Alkaline Phosphatase: 59 U/L (ref 38–126)
Anion gap: 9 (ref 5–15)
BUN: 17 mg/dL (ref 8–23)
CO2: 22 mmol/L (ref 22–32)
Calcium: 9 mg/dL (ref 8.9–10.3)
Chloride: 106 mmol/L (ref 98–111)
Creatinine, Ser: 0.96 mg/dL (ref 0.44–1.00)
GFR, Estimated: >60 mL/min (ref >60)
Glucose, Bld: 89 mg/dL (ref 70–99)
Potassium: 3.9 mmol/L (ref 3.5–5.1)
Sodium: 137 mmol/L (ref 135–145)
Total Bilirubin: 0.5 mg/dL (ref 0.3–1.2)
Total Protein: 7.3 g/dL (ref 6.5–8.1)

## 2022-11-19 LAB — DIFFERENTIAL
Abs Immature Granulocytes: 0.01 10*3/uL (ref 0.00–0.07)
Basophils Absolute: 0 10*3/uL (ref 0.0–0.1)
Basophils Relative: 0 %
Eosinophils Absolute: 0.1 10*3/uL (ref 0.0–0.5)
Eosinophils Relative: 1 %
Immature Granulocytes: 0 %
Lymphocytes Relative: 56 %
Lymphs Abs: 3.4 10*3/uL (ref 0.7–4.0)
Monocytes Absolute: 0.5 10*3/uL (ref 0.1–1.0)
Monocytes Relative: 8 %
Neutro Abs: 2.2 10*3/uL (ref 1.7–7.7)
Neutrophils Relative %: 35 %

## 2022-11-19 LAB — URINALYSIS, ROUTINE W REFLEX MICROSCOPIC
Bilirubin Urine: NEGATIVE
Glucose, UA: NEGATIVE mg/dL
Ketones, ur: NEGATIVE mg/dL
Leukocytes,Ua: NEGATIVE
Nitrite: NEGATIVE
Protein, ur: NEGATIVE mg/dL
Specific Gravity, Urine: 1.02 (ref 1.005–1.030)
pH: 6.5 (ref 5.0–8.0)

## 2022-11-19 LAB — APTT: aPTT: 21 seconds — ABNORMAL LOW (ref 24–36)

## 2022-11-19 LAB — CBC
HCT: 34.8 % — ABNORMAL LOW (ref 36.0–46.0)
Hemoglobin: 11.5 g/dL — ABNORMAL LOW (ref 12.0–15.0)
MCH: 29.1 pg (ref 26.0–34.0)
MCHC: 33 g/dL (ref 30.0–36.0)
MCV: 88.1 fL (ref 80.0–100.0)
Platelets: 144 10*3/uL — ABNORMAL LOW (ref 150–400)
RBC: 3.95 MIL/uL (ref 3.87–5.11)
RDW: 13.2 % (ref 11.5–15.5)
WBC: 6.1 10*3/uL (ref 4.0–10.5)
nRBC: 0 % (ref 0.0–0.2)

## 2022-11-19 LAB — ETHANOL: Alcohol, Ethyl (B): <10 mg/dL (ref <10)

## 2022-11-19 LAB — RAPID URINE DRUG SCREEN, HOSP PERFORMED
Amphetamines: NOT DETECTED
Barbiturates: NOT DETECTED
Benzodiazepines: NOT DETECTED
Cocaine: NOT DETECTED
Opiates: NOT DETECTED
Tetrahydrocannabinol: NOT DETECTED

## 2022-11-19 LAB — URINALYSIS, MICROSCOPIC (REFLEX): WBC, UA: NONE SEEN WBC/hpf (ref 0–5)

## 2022-11-19 LAB — POCT FASTING CBG KUC MANUAL ENTRY: POCT Glucose (KUC): 95 mg/dL (ref 70–99)

## 2022-11-19 LAB — PROTIME-INR
INR: 1 (ref 0.8–1.2)
Prothrombin Time: 13 seconds (ref 11.4–15.2)

## 2022-11-19 LAB — CBG MONITORING, ED: Glucose-Capillary: 83 mg/dL (ref 70–99)

## 2022-11-19 MED ORDER — IOHEXOL 350 MG/ML SOLN
100.0000 mL | Freq: Once | INTRAVENOUS | Status: AC | PRN
  Administered 2022-11-19: 100 mL via INTRAVENOUS

## 2022-11-19 MED ORDER — SODIUM CHLORIDE 0.9% FLUSH
3.0000 mL | Freq: Once | INTRAVENOUS | Status: DC
  Filled 2022-11-19: qty 3

## 2022-11-19 NOTE — ED Triage Notes (Signed)
Patient arrived via POV c/o sudden onset headache last night with weakness to left leg and blurred vision x 21hrs. Patient states no relief during day. Patient seen at [facility] for same sent here for eval. Patient is AO x 4, VS WDL,normal gait.

## 2022-11-19 NOTE — ED Notes (Signed)
Patient is being discharged from the Urgent Care and sent to the Emergency Department via POV. Per Derenda Fennel PA, patient is in need of higher level of care due to need for further evaluation . Patient is aware and verbalizes understanding of plan of care.  Vitals:   11/19/22 1824  BP: 139/83  Pulse: 71  Temp: 99.2 F (37.3 C)  SpO2: 96%

## 2022-11-19 NOTE — ED Triage Notes (Signed)
Pt states she stopped taking BP pill in October. Pt states PCP had her stop.

## 2022-11-19 NOTE — ED Provider Notes (Addendum)
[facility]    CSN: [id number] Arrival date & time: 11/19/22  1809      History   Chief Complaint Chief Complaint  Patient presents with   Blurred Vision   Weakness    HPI [name] is a 62 y.o. female.   62 year old female presents today due to concerns of a headache, blurred vision, and weakness that started abruptly around 7:00 last evening.  She reports last night she was in the car driving and got an acute onset of a headache, located primarily to the frontal region midline.  She also developed blurred vision at the same time.  Additionally, patient felt like her left leg was weaker than normal, but she denies weakness elsewhere.  No personal history of stroke.  Does have history of DVT and PE in the past, but states she was on anticoagulation 20 something years ago and nothing recently.  She denies blind spots in her vision.  Imitrex is a medication listed on her profile, but she is currently denying a history of migraines.  She denies this being the worst headache of her life, but does state it occurred abruptly and has not improved over the past 24 hours.  She denies slurred speech, gait disturbance, weakness of additional extremities, numbness or tingling, chest pain. Notes that she stopped taking Ozempic abruptly roughly three weeks ago.    Weakness   Past Medical History:  Diagnosis Date   Anemia 2009    Baseline hemoglobin at 11, secondary to iron deficiency   Arthritis    Breast mass 02/2008    Noted on mammogram March 11, 2008 3 cm simple cyst at 9 to 10:00 position of the right breast as well as multiple other smaller simple cyst, 2.6 cm mass at the 10:00 position of the right breast also representing complex cyst,   Deep vein thrombosis (DVT) ([place])     history of multiple  DVTs in 1989, 1989, 2004, on chronic anticoagulation with Coumadin   Diverticulosis    noted colonoscopy 2010   Eczema    Fibroid uterus  2001    status post total abdominal  hysterectomy, right and left salpingo-oophorectomy , done by Dr. 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    her biopsy results of the left breast excisional biopsy of mass, phyllodes tumeor with physical borderline features, 2.6 cm, margins negative, fibrocystic change including duct ectasia, fibrosis, apocrine metaplasia and focal calcification    Patient Active Problem List   Diagnosis Date Noted   Tinnitus of right ear 06/06/2022   Hearing loss due to cerumen impaction, right 06/06/2022   Migraines 05/01/2022   Headache 04/10/2022   Prediabetes 01/05/2022   Hematochezia 01/05/2022   Back pain 09/04/2021   Limb cramps 03/30/2021   Atypical chest pain 02/14/2021   DOE (dyspnea on exertion) 02/04/2020   Shoulder impingement syndrome, right 02/04/2020   Depression 05/16/2018   Neuropathy 01/28/2018   Obesity, morbid 09/28/2016   Hyperlipidemia 01/27/2014   Headache(784.0) 09/29/2013   Bilateral knee pain 05/25/2013   Eczema 05/12/2013   Insomnia 05/12/2013  Diverticulosis 04/18/2012   Healthcare maintenance 02/07/2011   Rash 11/13/2007   Upper airway cough syndrome 12/03/2006   Essential hypertension 05/29/2006   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 [name]   BREAST LUMPECTOMY WITH RADIOACTIVE SEED LOCALIZATION Right 07/09/2017   Procedure: RIGHT BREAST LUMPECTOMY WITH RADIOACTIVE SEED LOCALIZATION;  Surgeon: Erroll Luna, MD;  Location: [facility];  Service: General;  Laterality: Right;   CHOLECYSTECTOMY     1997   MASTECTOMY, PARTIAL   03/2006    left partial mastectomy secondary to fibrocystic disease intraluminal fibroadenoma performed March 28, 2006 done by Dr. [name]    OB History   No obstetric history on file.      Home Medications    Prior to Admission medications   Medication Sig Start Date End Date Taking? Authorizing Provider  albuterol (VENTOLIN HFA) 108 (90 Base) MCG/ACT inhaler INHALE 1-2 PUFFS BY MOUTH EVERY 6 HOURS AS NEEDED FOR WHEEZE OR SHORTNESS OF BREATH 10/03/22   Iona Beard, MD  amLODipine (NORVASC) 5 MG tablet Take 1 tablet (5 mg total) by mouth daily. 01/05/22   Iona Beard, MD  cyclobenzaprine (FLEXERIL) 5 MG tablet TAKE 1 TABLET BY MOUTH AT BEDTIME AS NEEDED FOR MUSCLE SPASMS. 10/30/22   Iona Beard, MD  ibuprofen (ADVIL) 800 MG tablet Take 1 tablet (800 mg total) by mouth every 8 (eight) hours as needed. 05/01/22   Masters, Joellen Jersey, DO  metFORMIN (GLUCOPHAGE) 500 MG tablet Take 1 tablet (500 mg total) by mouth daily with breakfast. 09/26/22 10/26/22  Delene Ruffini, MD  Multiple Vitamin (MULTIVITAMIN) capsule Take 1 capsule daily by mouth.    [provider]  rosuvastatin (CRESTOR) 20 MG tablet TAKE 1 TABLET BY MOUTH EVERY DAY 03/28/22   Lacinda Axon, MD  Semaglutide, 2 MG/DOSE, (OZEMPIC, 2 MG/DOSE,) 8 MG/3ML SOPN Inject 2 mg into the skin once a week. 10/23/22 01/21/23  Idamae Schuller, MD    Family History Family History  Problem Relation Age of Onset   Cancer Other    Clotting disorder Other    Gout Father    Stroke Neg Hx     Social History Social History   Tobacco Use   Smoking status: Never   Smokeless tobacco: Never  Substance Use Topics   Alcohol use: No    Alcohol/week: 0.0 standard drinks of alcohol    Comment: rare   Drug use: No     Allergies   Dilaudid [hydromorphone hcl]   Review of Systems Review of Systems  Neurological:  Positive for weakness.  As per HPI   Physical Exam Triage Vital Signs ED Triage Vitals  Enc Vitals Group     BP  11/19/22 1824 139/83     Pulse Rate 11/19/22 1824 71     Resp --      Temp 11/19/22 1824 99.2 F (37.3 C)     Temp Source 11/19/22 1824 Oral     SpO2 11/19/22 1824 96 %     Weight --      Height --      Head Circumference --      Peak Flow --      Pain Score 11/19/22 1816 0     Pain Loc --      Pain Edu? --      Excl. in [place]? --    No data found.  Updated Vital Signs  BP 139/83 (BP Location: Right Arm)   Pulse 71   Temp 99.2 F (37.3 C) (Oral)   SpO2 96%   Visual Acuity Right Eye Distance: 20/40 Left Eye Distance: 20/40 Bilateral Distance: 20/40  Right Eye Near:   Left Eye Near:    Bilateral Near:     Physical Exam Vitals and nursing note reviewed.  Constitutional:      General: She is not in acute distress.    Appearance: Normal appearance. She is well-developed. She is obese. She is not ill-appearing, toxic-appearing or diaphoretic.  HENT:     Head: Normocephalic and atraumatic.     Right Ear: Tympanic membrane, ear canal and external ear normal.     Left Ear: Tympanic membrane, ear canal and external ear normal.     Nose: Nose normal.     Mouth/Throat:     Mouth: Mucous membranes are moist.     Pharynx: No oropharyngeal exudate or posterior oropharyngeal erythema.  Eyes:     Extraocular Movements: Extraocular movements intact.     Conjunctiva/sclera: Conjunctivae normal.     Pupils: Pupils are equal, round, and reactive to light.  Cardiovascular:     Rate and Rhythm: Normal rate and regular rhythm.     Pulses: Normal pulses.     Heart sounds: No murmur heard. Pulmonary:     Effort: Pulmonary effort is normal. No respiratory distress.     Breath sounds: Normal breath sounds.  Abdominal:     Palpations: Abdomen is soft.     Tenderness: There is no abdominal tenderness.  Musculoskeletal:        General: No swelling.     Cervical back: Normal range of motion and neck supple. No rigidity or tenderness.  Lymphadenopathy:     Cervical: No cervical  adenopathy.  Skin:    General: Skin is warm and dry.     Capillary Refill: Capillary refill takes less than 2 seconds.  Neurological:     General: No focal deficit present.     Mental Status: She is alert and oriented to person, place, and time. Mental status is at baseline.     Cranial Nerves: Cranial nerves 2-12 are intact. No cranial nerve deficit or facial asymmetry.     Sensory: Sensation is intact. No sensory deficit.     Motor: Motor function is intact. No weakness, tremor, atrophy or pronator drift.     Coordination: Coordination is intact. Romberg sign negative. Coordination normal. Finger-Nose-Finger Test normal.     Gait: Gait is intact. Gait normal.     Deep Tendon Reflexes: Reflexes are normal and symmetric.  Psychiatric:        Mood and Affect: Mood normal.      UC Treatments / Results  Labs (all labs ordered are listed, but only abnormal results are displayed) Labs Reviewed  POCT FASTING [facility] [place]    EKG   Radiology No results found.  Procedures ED EKG  Date/Time: 11/19/2022 6:08 PM  Performed by: Chaney Malling, PA Authorized by: Chaney Malling, PA   Interpretation:    Interpretation: normal   Rate:    ECG rate:  68   ECG rate assessment: normal   Rhythm:    Rhythm: sinus rhythm   Ectopy:    Ectopy: none   ST segments:    ST segments:  Normal T waves:    T waves: normal    (including critical care time)  Medications Ordered in UC Medications - No  data to display  Initial Impression / Assessment and Plan / UC Course  I have reviewed the triage vital signs and the nursing notes.  Pertinent labs & imaging results that were available during my care of the patient were reviewed by me and considered in my medical decision making (see chart for details).     Weakness -patient reports stated weakness to the left lower extremity.  I do not appreciate any notable weakness on physical exam compared to the right. Gait not affected.   Blurred vision - VA 20/40 B. No baseline for comparison Headache -discussed with patient's limitation of full evaluation in urgent care.  Full neurological exam performed in UC, without appreciable findings, however given her history of acute onset of headache with blurred vision particular in the setting of blood clots in the past, full evaluation in the emergency room is necessary.  Patient has had symptoms for greater than 24 hours at this time, and no neurological deficits noted in office therefore pt safe to arrive to ER via personal vehicle; daughter here to transport pt.   Final Clinical Impressions(s) / UC Diagnoses   Final diagnoses:  Weakness  Blurred vision, bilateral  Acute intractable headache, unspecified headache type     Discharge Instructions      Due to your headache, weakness and blurred vision in the setting of history of blood clots, you need to have a more comprehensive evaluation through the emergency room. Please head to the ER now for a full workup and evaluation.    ED Prescriptions   None    PDMP not reviewed this encounter.   Chaney Malling, [place] 11/19/22 [place], [place], [place] 11/19/22 [phone]

## 2022-11-19 NOTE — ED Triage Notes (Signed)
Pt presents with headaches from last night, states she was driving and began to have blurred vision and left side weakness.

## 2022-11-19 NOTE — ED Triage Notes (Signed)
Pt states she feels some dizziness and states she feels ff.

## 2022-11-19 NOTE — ED Provider Notes (Signed)
[name] Provider Note   CSN: [id number] Arrival date & time: 11/19/22  1946     History {Add pertinent medical, surgical, social history, OB history to HPI:1} Chief Complaint  Patient presents with   Code Stroke    [name] is a 62 y.o. female with HTN, h/o PE not on AC, HLD, migraines, who presents with blurry vision, headache, weakness.  Patient presented to UC who sent her to ED.   P/w headache, blurred vision, and weakness that started abruptly around 7:00 last evening. She reports last night she was in the car driving and got an acute onset of a headache, located primarily to the frontal region midline. She also developed blurred vision at the same time. Additionally, patient felt like her left leg was weaker than normal, but she denies weakness elsewhere. No personal history of stroke. Does have history of DVT and PE in the past, but states she was on anticoagulation 20 something years ago and nothing recently. She denies blind spots in her vision. Imitrex is a medication listed on her profile, but she is currently denying a history of migraines. She denies this being the worst headache of her life, but does state it occurred abruptly and has not improved over the past 24 hours. She denies slurred speech, gait disturbance, weakness of additional extremities, numbness or tingling, chest pain. Notes that she stopped taking Ozempic abruptly roughly three weeks ago.   HPI     Home Medications Prior to Admission medications   Medication Sig Start Date End Date Taking? Authorizing Provider  albuterol (VENTOLIN HFA) 108 (90 Base) MCG/ACT inhaler INHALE 1-2 PUFFS BY MOUTH EVERY 6 HOURS AS NEEDED FOR WHEEZE OR SHORTNESS OF BREATH 10/03/22   Quincy SimmondsLiang, Jessica, MD  amLODipine (NORVASC) 5 MG tablet Take 1 tablet (5 mg total) by mouth daily. 01/05/22   Quincy SimmondsLiang, Jessica, MD  cyclobenzaprine (FLEXERIL) 5 MG tablet TAKE 1 TABLET BY MOUTH AT  BEDTIME AS NEEDED FOR MUSCLE SPASMS. 10/30/22   Quincy SimmondsLiang, Jessica, MD  ibuprofen (ADVIL) 800 MG tablet Take 1 tablet (800 mg total) by mouth every 8 (eight) hours as needed. 05/01/22   Masters, Florentina AddisonKatie, DO  metFORMIN (GLUCOPHAGE) 500 MG tablet Take 1 tablet (500 mg total) by mouth daily with breakfast. 09/26/22 10/26/22  Adron BeneGawaluck, [place], MD  Multiple Vitamin (MULTIVITAMIN) capsule Take 1 capsule daily by mouth.    [provider]  rosuvastatin (CRESTOR) 20 MG tablet TAKE 1 TABLET BY MOUTH EVERY DAY 03/28/22   Steffanie RainwaterAmponsah, Prosper M, MD  Semaglutide, 2 MG/DOSE, (OZEMPIC, 2 MG/DOSE,) 8 MG/3ML SOPN Inject 2 mg into the skin once a week. 10/23/22 01/21/23  Gwenevere AbbotKhan, Ghalib, MD      Allergies    Dilaudid [hydromorphone hcl]    Review of Systems   Review of Systems Review of systems {pos/neg:18640::"Negative","Positive"} for ***.  A 10 [name] review of systems was performed and is negative unless otherwise reported in HPI.  Physical Exam Updated Vital Signs BP 126/64 (BP Location: Left Wrist)   Pulse 75   Temp 98.4 F (36.9 C) (Oral)   Resp 20   Ht 5\' 11"  (1.803 m)   Wt 112 kg   SpO2 100%   BMI 34.45 kg/m  Physical Exam General: Normal appearing female, lying in bed.  HEENT: PERRLA, Sclera anicteric, MMM, trachea midline.  Cardiology: RRR, no murmurs/rubs/gallops. BL radial and DP pulses equal bilaterally.  Resp: Normal respiratory rate and effort. CTAB, no wheezes, rhonchi, crackles.  Abd: Soft, non-tender, non-distended. No rebound tenderness or guarding.  GU: Deferred. MSK: No peripheral edema or signs of trauma. Extremities without deformity or TTP. No cyanosis or clubbing. Skin: warm, dry. No rashes or lesions. Back: No CVA tenderness Neuro: A&Ox4, CNs II-XII grossly intact. MAEs. Sensation grossly intact.  Psych: Normal mood and affect.   1a  Level of consciousness: 0=alert; keenly responsive  1b. LOC questions:  {loc commands neuro:31401}  1c. LOC commands: {loc commands neuro:31401}   2.  Best Gaze: {best gaze neuro:31402}  3.  Visual: {visual neuro:31403}  4. Facial Palsy: {facial palsy:31404}  5a.  Motor left arm: {motor neuro:31405}  5b.  Motor right arm: {motor neuro:31405}  6a. motor left leg: {motor neuro:31405}  6b  Motor right leg:  {motor neuro:31405}  7. Limb Ataxia: {limb ataxia neuro:31406}  8.  Sensory: {sensory neuro:31407}  9. Best Language:  {best language neuro:31408}  10. Dysarthria: {dysarthria neuro:31409}  11. Extinction and Inattention: {extinction neuro:31410}   Total:   ***        ED Results / Procedures / Treatments   Labs (all labs ordered are listed, but only abnormal results are displayed) Labs Reviewed  APTT - Abnormal; Notable for the following components:      Result Value   aPTT 21 (*)    All other components within normal limits  CBC - Abnormal; Notable for the following components:   Hemoglobin 11.5 (*)    HCT 34.8 (*)    Platelets 144 (*)    All other components within normal limits  URINALYSIS, ROUTINE W REFLEX MICROSCOPIC - Abnormal; Notable for the following components:   Hgb urine dipstick TRACE (*)    All other components within normal limits    EKG EKG Interpretation  Date/Time:  Monday November 19 2022 20:02:20 EDT Ventricular Rate:  63 PR Interval:  185 QRS Duration: 82 QT Interval:  413 QTC Calculation: 423 R Axis:   65 Text Interpretation: Sinus rhythm Confirmed by Vivi BarrackNaasz, Teona Vargus [phone]) on 11/19/2022 8:37:36 PM  Radiology CTH: No acute intracranial hemorrhage or infarct.   CTA H&N: 1. No intracranial large vessel occlusion or significant stenosis. 2. No hemodynamically significant stenosis in the neck.  Procedures Procedures  {Document cardiac monitor, telemetry assessment procedure when appropriate:1}  Medications Ordered in ED Medications  iohexol (OMNIPAQUE) 350 MG/ML injection 100 mL (100 mLs Intravenous Contrast Given 11/19/22 2104)  aspirin chewable tablet 81 mg (81 mg Oral Given 11/20/22  0117)  clopidogrel (PLAVIX) tablet 75 mg (75 mg Oral Given 11/20/22 0117)  ondansetron (ZOFRAN-ODT) disintegrating tablet 8 mg (8 mg Oral Given 11/20/22 0905)   stroke: early stages of recovery book ( Does not apply Given 11/20/22 1452)  LORazepam (ATIVAN) tablet 1 mg (1 mg Oral Given 11/20/22 1451)  ketorolac (TORADOL) 30 MG/ML injection 30 mg (30 mg Intravenous Given 11/21/22 1252)  metoCLOPramide (REGLAN) injection 5 mg (5 mg Intravenous Given 11/21/22 1253)  LORazepam (ATIVAN) injection 0.5 mg (0.5 mg Intravenous Given 11/22/22 1128)    ED Course/ Medical Decision Making/ A&P                          Medical Decision Making Amount and/or Complexity of Data Reviewed Labs: ordered. Decision-making details documented in ED Course. Radiology: ordered. Decision-making details documented in ED Course.  Risk OTC drugs. Prescription drug management. Decision regarding hospitalization.    This patient presents to the ED for concern of headache blurry vision leg weakness; this  involves an extensive number of treatment options, and is a complaint that carries with it a high risk of complications and morbidity.  I considered the following differential and admission for this acute, potentially life threatening condition.   MDM:    Given the acute onset of neurological symptoms, stroke is the most concerning etiology of these acute symptoms. The neuro exam is significant for L leg drift, decreased sensation in L leg, NIHSS 2. Also consider peripheral neuropathy, ***  Neurology team evaluating patient at bedside. Plan to obtain emergent CT brain.  LKN: 7PM yesterday (>24 hours ago) Glucose: *** AC: *** BP: ***   Clinical Course as of 11/27/22 2237  Mon Nov 19, 2022  2037 Comprehensive metabolic panel wnl [HN]  2037 CBC(!) Mildly decreased Hgb, otherwise unremarkable [HN]  2037 Alcohol, Ethyl (B): <10 [HN]  2037 Glucose-Capillary: 83 [HN]  2143 CT ANGIO HEAD NECK W WO CM 1. No intracranial  large vessel occlusion or significant stenosis. 2. No hemodynamically significant stenosis in the neck.   [HN]  2143 Consulted to neurology for persistent neuro symptom [HN]  2205 Paging neurology [HN]  2253 Rapid urine drug screen (hospital performed) Neg [HN]  2331 Awaiting neurology, repaging [HN]  Tue Nov 20, 2022  0008 While awaiting Neurology consult I spoke with the patient regarding her current symptoms, results and plan (initially attempted Teleneurology consult, but significant delay per prior MD). She is outside of treatment window for CVA and has minimal symptoms now. She does not want to stay for Teleneurology consult or for potential transfer to [facility] for MRI. She will call her PCP tomorrow to arrange outpatient follow up. Recommend she begin taking ASA in the meantime. She was advised to return to ED, preferably [facility] if her symptoms worsen or if she is unable to arrange close outpatient followup [CS]  0023 While awaiting discharge, the teleneurologist was able to connect and evaluate the patient. She has recommended admission for further workup. I discussed this with the patient and she is now amenable to admission. Will page the hospitalist. Begin ASA and Plavix  [CS]  0115 Spoke with Dr. Arville Care, Hospitalist, who will accept for admission.  [CS]    Clinical Course User Index [CS] Pollyann Savoy, MD [HN] Loetta Rough, MD    Labs: I Ordered, and personally interpreted labs.  The pertinent results include:  those listed above  Imaging Studies ordered: I ordered imaging studies including CT, CTA H&N I independently visualized and interpreted imaging. I agree with the radiologist interpretation  Additional history obtained from chart review.  Cardiac Monitoring: The patient was maintained on a cardiac monitor.  I personally viewed and interpreted the cardiac monitored which showed an underlying rhythm of: NSR  Social Determinants of Health: Patient lives  independently   Disposition:  Delay in obtaining neurology consult. Given that patient's symptoms started >24 h ago, patient is not a code stroke. Patient is signed out to the oncoming ED physician Dr. Bernette Mayers who is made aware of her history, presentation, exam, workup, and plan.    Co morbidities that complicate the patient evaluation  Past Medical History:  Diagnosis Date   Anemia 2009    Baseline hemoglobin at 11, secondary to iron deficiency   Arthritis    Breast mass 02/2008    Noted on mammogram March 11, 2008 3 cm simple cyst at 9 to 10:00 position of the right breast as well as multiple other smaller simple cyst, 2.6 cm mass at the 10:00  position of the right breast also representing complex cyst,   Deep vein thrombosis (DVT) ([facility])     history of multiple  DVTs in 1989, 1989, 2004, on chronic anticoagulation with Coumadin   Diverticulosis    noted colonoscopy 2010   Eczema    Fibroid uterus  2001    status post total abdominal hysterectomy, right and left salpingo-oophorectomy , done by Dr. 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   I have reviewed the patients home medicines and have made adjustments as needed  Problem List / ED Course: Problem List Items Addressed This Visit   None        {Document critical care time when  appropriate:1} {Document review of labs and clinical decision tools ie heart score, Chads2Vasc2 etc:1}  {Document your independent review of radiology images, and any outside records:1} {Document your discussion with family members, caretakers, and with consultants:1} {Document social determinants of health affecting pt's care:1} {Document your decision making why or why not admission, treatments were needed:1}  This note was created using dictation software, which may contain spelling or grammatical errors.

## 2022-11-19 NOTE — Discharge Instructions (Signed)
Due to your headache, weakness and blurred vision in the setting of history of blood clots, you need to have a more comprehensive evaluation through the emergency room. Please head to the ER now for a full workup and evaluation.

## 2022-11-20 ENCOUNTER — Observation Stay (HOSPITAL_COMMUNITY): Payer: 59

## 2022-11-20 ENCOUNTER — Encounter: Payer: Self-pay | Admitting: Student

## 2022-11-20 ENCOUNTER — Encounter (HOSPITAL_COMMUNITY): Payer: Self-pay | Admitting: Internal Medicine

## 2022-11-20 DIAGNOSIS — R2990 Unspecified symptoms and signs involving the nervous system: Secondary | ICD-10-CM | POA: Diagnosis not present

## 2022-11-20 DIAGNOSIS — Z6834 Body mass index (BMI) 34.0-34.9, adult: Secondary | ICD-10-CM | POA: Diagnosis not present

## 2022-11-20 DIAGNOSIS — Z79899 Other long term (current) drug therapy: Secondary | ICD-10-CM | POA: Diagnosis not present

## 2022-11-20 DIAGNOSIS — M2578 Osteophyte, vertebrae: Secondary | ICD-10-CM | POA: Diagnosis not present

## 2022-11-20 DIAGNOSIS — Z7985 Long-term (current) use of injectable non-insulin antidiabetic drugs: Secondary | ICD-10-CM | POA: Diagnosis not present

## 2022-11-20 DIAGNOSIS — Z823 Family history of stroke: Secondary | ICD-10-CM | POA: Diagnosis not present

## 2022-11-20 DIAGNOSIS — E6609 Other obesity due to excess calories: Secondary | ICD-10-CM | POA: Diagnosis not present

## 2022-11-20 DIAGNOSIS — E119 Type 2 diabetes mellitus without complications: Secondary | ICD-10-CM

## 2022-11-20 DIAGNOSIS — Z789 Other specified health status: Secondary | ICD-10-CM | POA: Clinically undetermined

## 2022-11-20 DIAGNOSIS — Z9012 Acquired absence of left breast and nipple: Secondary | ICD-10-CM | POA: Diagnosis not present

## 2022-11-20 DIAGNOSIS — I639 Cerebral infarction, unspecified: Secondary | ICD-10-CM | POA: Diagnosis not present

## 2022-11-20 DIAGNOSIS — R531 Weakness: Secondary | ICD-10-CM | POA: Diagnosis not present

## 2022-11-20 DIAGNOSIS — Z809 Family history of malignant neoplasm, unspecified: Secondary | ICD-10-CM | POA: Diagnosis not present

## 2022-11-20 DIAGNOSIS — M5412 Radiculopathy, cervical region: Secondary | ICD-10-CM | POA: Diagnosis not present

## 2022-11-20 DIAGNOSIS — Z86711 Personal history of pulmonary embolism: Secondary | ICD-10-CM | POA: Diagnosis not present

## 2022-11-20 DIAGNOSIS — E785 Hyperlipidemia, unspecified: Secondary | ICD-10-CM | POA: Diagnosis not present

## 2022-11-20 DIAGNOSIS — R29898 Other symptoms and signs involving the musculoskeletal system: Secondary | ICD-10-CM | POA: Diagnosis present

## 2022-11-20 DIAGNOSIS — I10 Essential (primary) hypertension: Secondary | ICD-10-CM | POA: Diagnosis not present

## 2022-11-20 DIAGNOSIS — Z832 Family history of diseases of the blood and blood-forming organs and certain disorders involving the immune mechanism: Secondary | ICD-10-CM | POA: Diagnosis not present

## 2022-11-20 DIAGNOSIS — H538 Other visual disturbances: Secondary | ICD-10-CM | POA: Diagnosis not present

## 2022-11-20 DIAGNOSIS — Z885 Allergy status to narcotic agent status: Secondary | ICD-10-CM | POA: Diagnosis not present

## 2022-11-20 DIAGNOSIS — Z87891 Personal history of nicotine dependence: Secondary | ICD-10-CM | POA: Diagnosis not present

## 2022-11-20 DIAGNOSIS — Z86718 Personal history of other venous thrombosis and embolism: Secondary | ICD-10-CM | POA: Diagnosis not present

## 2022-11-20 DIAGNOSIS — Z7984 Long term (current) use of oral hypoglycemic drugs: Secondary | ICD-10-CM | POA: Diagnosis not present

## 2022-11-20 LAB — LIPID PANEL
Cholesterol: 152 mg/dL (ref 0–200)
HDL: 51 mg/dL (ref >40)
LDL Cholesterol: 92 mg/dL (ref 0–99)
Total CHOL/HDL Ratio: 3 RATIO
Triglycerides: 46 mg/dL (ref <150)
VLDL: 9 mg/dL (ref 0–40)

## 2022-11-20 LAB — HIV ANTIBODY (ROUTINE TESTING W REFLEX): HIV Screen 4th Generation wRfx: NONREACTIVE

## 2022-11-20 LAB — GLUCOSE, CAPILLARY
Glucose-Capillary: 87 mg/dL (ref 70–99)
Glucose-Capillary: 109 mg/dL — ABNORMAL HIGH (ref 70–99)

## 2022-11-20 LAB — TSH: TSH: 0.704 u[IU]/mL (ref 0.350–4.500)

## 2022-11-20 MED ORDER — ACETAMINOPHEN 650 MG RE SUPP: 650.0000 mg | RECTAL | Status: DC | PRN

## 2022-11-20 MED ORDER — SENNOSIDES-DOCUSATE SODIUM 8.6-50 MG PO TABS: 1.0000 | ORAL_TABLET | Freq: Every evening | ORAL | Status: DC | PRN

## 2022-11-20 MED ORDER — ROSUVASTATIN CALCIUM 20 MG PO TABS
20.0000 mg | ORAL_TABLET | Freq: Every day | ORAL | Status: DC
  Administered 2022-11-21 – 2022-11-22 (×2): 20 mg via ORAL
  Filled 2022-11-20 (×2): qty 1

## 2022-11-20 MED ORDER — ASPIRIN 81 MG PO CHEW
81.0000 mg | CHEWABLE_TABLET | Freq: Once | ORAL | Status: AC
  Administered 2022-11-20: 81 mg via ORAL
  Filled 2022-11-20: qty 1

## 2022-11-20 MED ORDER — PANTOPRAZOLE SODIUM 40 MG PO TBEC
40.0000 mg | DELAYED_RELEASE_TABLET | Freq: Every day | ORAL | Status: DC
  Administered 2022-11-20 – 2022-11-22 (×3): 40 mg via ORAL
  Filled 2022-11-20 (×3): qty 1

## 2022-11-20 MED ORDER — LORAZEPAM 1 MG PO TABS
1.0000 mg | ORAL_TABLET | Freq: Once | ORAL | Status: AC | PRN
  Administered 2022-11-20: 1 mg via ORAL
  Filled 2022-11-20: qty 1

## 2022-11-20 MED ORDER — ENOXAPARIN SODIUM 40 MG/0.4ML IJ SOSY
40.0000 mg | PREFILLED_SYRINGE | INTRAMUSCULAR | Status: DC
  Administered 2022-11-20 – 2022-11-21 (×2): 40 mg via SUBCUTANEOUS
  Filled 2022-11-20 (×2): qty 0.4

## 2022-11-20 MED ORDER — SODIUM CHLORIDE 0.9 % IV SOLN: INTRAVENOUS | Status: DC

## 2022-11-20 MED ORDER — ACETAMINOPHEN 160 MG/5ML PO SOLN: 650.0000 mg | ORAL | Status: DC | PRN

## 2022-11-20 MED ORDER — ACETAMINOPHEN 325 MG PO TABS
650.0000 mg | ORAL_TABLET | ORAL | Status: DC | PRN
  Administered 2022-11-20 (×2): 650 mg via ORAL
  Filled 2022-11-20 (×2): qty 2

## 2022-11-20 MED ORDER — CLOPIDOGREL BISULFATE 75 MG PO TABS
75.0000 mg | ORAL_TABLET | Freq: Once | ORAL | Status: AC
  Administered 2022-11-20: 75 mg via ORAL
  Filled 2022-11-20: qty 1

## 2022-11-20 MED ORDER — DIPHENHYDRAMINE HCL 25 MG PO CAPS
25.0000 mg | ORAL_CAPSULE | Freq: Every evening | ORAL | Status: DC | PRN
  Administered 2022-11-20 – 2022-11-21 (×2): 25 mg via ORAL
  Filled 2022-11-20 (×2): qty 1

## 2022-11-20 MED ORDER — INSULIN ASPART 100 UNIT/ML IJ SOLN: 0.0000 [IU] | Freq: Three times a day (TID) | INTRAMUSCULAR | Status: DC

## 2022-11-20 MED ORDER — STROKE: EARLY STAGES OF RECOVERY BOOK
Freq: Once | Status: AC
  Filled 2022-11-20: qty 1

## 2022-11-20 MED ORDER — ASPIRIN 81 MG PO TBEC
81.0000 mg | DELAYED_RELEASE_TABLET | Freq: Every day | ORAL | Status: DC
  Administered 2022-11-20 – 2022-11-22 (×3): 81 mg via ORAL
  Filled 2022-11-20 (×3): qty 1

## 2022-11-20 MED ORDER — ONDANSETRON 4 MG PO TBDP
8.0000 mg | ORAL_TABLET | Freq: Once | ORAL | Status: AC
  Administered 2022-11-20: 8 mg via ORAL
  Filled 2022-11-20: qty 2

## 2022-11-20 MED ORDER — INSULIN ASPART 100 UNIT/ML IJ SOLN: 0.0000 [IU] | Freq: Every day | INTRAMUSCULAR | Status: DC

## 2022-11-20 NOTE — Progress Notes (Signed)
PT Cancellation Note  Patient Details Name: GILBERT BITTENBENDER MRN: [id number] DOB: 11-Jun-1961   Cancelled Treatment:    Reason Eval/Treat Not Completed: Patient at procedure or test/unavailable  Wyona Almas, PT, DPT Acute Rehabilitation Services Office [place] 11/20/2022, 4:16 PM

## 2022-11-20 NOTE — ED Notes (Signed)
ED TO INPATIENT HANDOFF REPORT  ED Nurse Name and Phone #: Purvis Kilts, RN [phone]  S Name/Age/Gender [name] 62 y.o. female Room/Bed: MH04/MH04  Code Status   Code Status: Not on file  Home/SNF/Other Home Patient oriented to: self, place, time, and situation Is this baseline? Yes   Triage Complete: Triage complete  Chief Complaint Left leg weakness [R29.898]  Triage Note Patient arrived via POV c/o sudden onset headache last night with weakness to left leg and blurred vision x 21hrs. Patient states no relief during day. Patient seen at [facility] for same sent here for eval. Patient is AO x 4, VS WDL,normal gait.   Allergies Allergies  Allergen Reactions   Dilaudid [Hydromorphone Hcl] Nausea Only    Level of Care/Admitting Diagnosis ED Disposition     ED Disposition  Admit   Condition  --   Comment  Hospital Area: [facility] [[id number]]  Level of Care: Telemetry Medical [104]  Interfacility transfer: Yes  May place patient in observation at [facility] or [place] if equivalent level of care is available:: No  Covid Evaluation: Asymptomatic - no recent exposure (last 10 days) testing not required  Diagnosis: Left leg weakness [id number]  Admitting Physician: Christel Mormon [id number]  Attending Physician: Christel Mormon [id number]          B Medical/Surgery History Past Medical History:  Diagnosis Date   Anemia 2009    Baseline hemoglobin at 11, secondary to iron deficiency   Arthritis    Breast mass 02/2008    Noted on mammogram March 11, 2008 3 cm simple cyst at 9 to 10:00 position of the right breast as well as multiple other smaller simple cyst, 2.6 cm mass at the 10:00 position of the right breast also representing complex cyst,   Deep vein thrombosis (DVT)     history of multiple  DVTs in 1989, 1989, 2004, on chronic anticoagulation with Coumadin   Diverticulosis    noted colonoscopy 2010   Eczema    Fibroid uterus  2001     status post total abdominal hysterectomy, right and left salpingo-oophorectomy , done by Dr. 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 [name]   BREAST LUMPECTOMY WITH RADIOACTIVE SEED LOCALIZATION Right 07/09/2017   Procedure: RIGHT BREAST LUMPECTOMY WITH RADIOACTIVE SEED LOCALIZATION;  Surgeon: Erroll Luna, MD;  Location: [place];  Service: General;  Laterality: Right;   [place], PARTIAL  03/2006    left partial mastectomy secondary to fibrocystic disease intraluminal fibroadenoma performed March 28, 2006 done by Dr. Pleas Koch IV Location/Drains/Wounds Patient Lines/Drains/Airways Status     Active Line/Drains/Airways     Name Placement date Placement time Site  Days   Peripheral IV 11/19/22 18 G Right Antecubital 11/19/22  2014  Antecubital  1            Intake/Output Last 24 hours No intake or output data in the 24 hours ending 11/20/22 1015  Labs/Imaging Results for orders placed or performed during the hospital encounter of 11/19/22 (from the past 48 hour(s))  CBG monitoring, ED     Status: None   Collection Time: 11/19/22  8:06 PM  Result Value Ref Range   Glucose-Capillary 83 70 - 99 mg/dL    Comment: Glucose reference range applies only to samples taken after  fasting for at least 8 hours.  Protime-INR     Status: None   Collection Time: 11/19/22  8:07 PM  Result Value Ref Range   Prothrombin Time 13.0 11.4 - 15.2 seconds   INR 1.0 0.8 - 1.2    Comment: (NOTE) INR goal varies based on device and disease states. Performed at [facility], [address] [place], [place] [address]   APTT     Status: Abnormal   Collection Time: 11/19/22  8:07 PM  Result Value Ref Range   aPTT 21 (L) 24 - 36 seconds    Comment: Performed at [facility], [address] [place], [place] [address]  CBC     Status: Abnormal   Collection Time: 11/19/22  8:07 PM  Result Value Ref Range   WBC 6.1 4.0 - 10.5 K/uL   RBC 3.95 3.87 - 5.11 MIL/uL   Hemoglobin 11.5 (L) 12.0 - 15.0 g/dL   HCT 34.8 (L) 36.0 - 46.0 %   MCV 88.1 80.0 - 100.0 fL   MCH 29.1 26.0 - 34.0 pg   MCHC 33.0 30.0 - 36.0 g/dL   RDW 13.2 11.5 - 15.5 %   Platelets 144 (L) 150 - 400 K/uL    Comment: SPECIMEN CHECKED FOR CLOTS REPEATED TO VERIFY    nRBC 0.0 0.0 - 0.2 %    Comment: Performed at [facility], [address] [place], [place] [address]  Differential     Status: None   Collection Time: 11/19/22  8:07 PM  Result Value Ref Range   Neutrophils Relative % 35 %   Neutro Abs 2.2 1.7 - 7.7 K/uL   Lymphocytes Relative 56 %   Lymphs Abs 3.4 0.7 - 4.0 K/uL   Monocytes Relative 8 %   Monocytes Absolute 0.5 0.1 - 1.0 K/uL   Eosinophils Relative 1 %   Eosinophils Absolute 0.1 0.0 - 0.5 K/uL   Basophils Relative 0 %   Basophils Absolute 0.0 0.0 - 0.1 K/uL   Immature Granulocytes 0 %   Abs Immature Granulocytes 0.01 0.00 - 0.07 K/uL    Comment: Performed at [facility], [address] [place], [place] [address]  Comprehensive metabolic panel     Status: None   Collection Time: 11/19/22  8:07 PM  Result Value Ref Range   Sodium 137 135 - 145 mmol/L   Potassium 3.9 3.5 - 5.1 mmol/L   Chloride 106 98 - 111 mmol/L   CO2 22 22 - 32 mmol/L    Glucose, Bld 89 70 - 99 mg/dL    Comment: Glucose reference range applies only to samples taken after fasting for at least 8 hours.   BUN 17 8 - 23 mg/dL   Creatinine, Ser 0.96 0.44 - 1.00 mg/dL   Calcium 9.0 8.9 -  10.3 mg/dL   Total Protein 7.3 6.5 - 8.1 g/dL   Albumin 4.0 3.5 - 5.0 g/dL   AST 31 15 - 41 U/L   ALT 37 0 - 44 U/L   Alkaline Phosphatase 59 38 - 126 U/L   Total Bilirubin 0.5 0.3 - 1.2 mg/dL   GFR, Estimated >60 >60 mL/min    Comment: (NOTE) Calculated using the CKD-EPI Creatinine Equation (2021)    Anion gap 9 5 - 15    Comment: Performed at [facility], [address] [place], [place] [address]  Ethanol     Status: None   Collection Time: 11/19/22  8:07 PM  Result Value Ref Range   Alcohol, Ethyl (B) <10 <10 mg/dL    Comment: (NOTE) Lowest detectable limit for serum alcohol is 10 mg/dL.  For medical purposes only. Performed at [facility], [address] [place], [place] [address]   Urinalysis, Routine w reflex microscopic -Urine, Clean Catch     Status: Abnormal   Collection Time: 11/19/22 10:10 PM  Result Value Ref Range   Color, Urine YELLOW YELLOW   APPearance CLEAR CLEAR   Specific Gravity, Urine 1.020 1.005 - 1.030   pH 6.5 5.0 - 8.0   Glucose, UA NEGATIVE NEGATIVE mg/dL   Hgb urine dipstick TRACE (A) NEGATIVE   Bilirubin Urine NEGATIVE NEGATIVE   Ketones, ur NEGATIVE NEGATIVE mg/dL   Protein, ur NEGATIVE NEGATIVE mg/dL   Nitrite NEGATIVE NEGATIVE   Leukocytes,Ua NEGATIVE NEGATIVE    Comment: Performed at [facility], [address] [place], [place] [address]  Rapid urine drug screen (hospital performed)     Status: None   Collection Time: 11/19/22 10:10 PM  Result Value Ref Range   Opiates NONE DETECTED NONE DETECTED   Cocaine NONE DETECTED NONE DETECTED   Benzodiazepines NONE DETECTED NONE DETECTED   Amphetamines NONE DETECTED NONE DETECTED   Tetrahydrocannabinol NONE DETECTED NONE DETECTED   Barbiturates  NONE DETECTED NONE DETECTED    Comment: (NOTE) DRUG SCREEN FOR MEDICAL PURPOSES ONLY.  IF CONFIRMATION IS NEEDED FOR ANY PURPOSE, NOTIFY LAB WITHIN 5 DAYS.  LOWEST DETECTABLE LIMITS FOR URINE DRUG SCREEN Drug Class                     Cutoff (ng/mL) Amphetamine and metabolites    1000 Barbiturate and metabolites    200 Benzodiazepine                 200 Opiates and metabolites        300 Cocaine and metabolites        300 THC                            50 Performed at [facility], [address] [place], [place] [address]   Urinalysis, Microscopic (reflex)     Status: Abnormal   Collection Time: 11/19/22 10:10 PM  Result Value Ref Range   RBC / HPF 0-5 0 - 5 RBC/hpf   WBC, UA NONE SEEN 0 - 5 WBC/hpf   Bacteria, UA RARE (A) NONE SEEN   Squamous Epithelial / HPF 0-5 0 - 5 /HPF    Comment: Performed at [facility], [address] [place], [place] [address]   CT [facility] HEAD NECK W WO CM  Result Date: 11/19/2022 CLINICAL DATA:  Sudden onset headache last  night, left leg weakness and blurred vision EXAM: CT ANGIOGRAPHY HEAD AND NECK TECHNIQUE: Multidetector CT imaging of the head and neck was performed using the standard protocol during bolus administration of intravenous contrast. Multiplanar CT image reconstructions and MIPs were obtained to evaluate the vascular anatomy. Carotid stenosis measurements (when applicable) are obtained utilizing NASCET criteria, using the distal internal carotid diameter as the denominator. RADIATION DOSE REDUCTION: This exam was performed according to the departmental dose-optimization program which includes automated exposure control, adjustment of the mA and/or kV according to patient size and/or use of iterative reconstruction technique. CONTRAST:  161mL OMNIPAQUE IOHEXOL 350 MG/ML SOLN COMPARISON:  No prior CTA available, correlation is made with 11/19/2022 CT head and 04/06/2017 MRA head and neck FINDINGS: CT HEAD FINDINGS For  noncontrast findings, please see same day CT head. CTA NECK FINDINGS Aortic arch: Standard branching. Imaged portion shows no evidence of aneurysm or dissection. No significant stenosis of the major arch vessel origins. Right carotid system: No evidence of dissection, occlusion, or hemodynamically significant stenosis (greater than 50%). Left carotid system: No evidence of dissection, occlusion, or hemodynamically significant stenosis (greater than 50%). Vertebral arteries: No evidence of dissection, occlusion, or hemodynamically significant stenosis (greater than 50%). Skeleton: No acute osseous abnormality. Degenerative changes in the cervical spine. Other neck: Negative. Upper chest: No focal pulmonary opacity or pleural effusion. Review of the MIP images confirms the above findings CTA HEAD FINDINGS Anterior circulation: Both internal carotid arteries are patent to the termini, without significant stenosis. A1 segments patent. Normal anterior communicating artery. Anterior cerebral arteries are patent to their distal aspects without significant stenosis. No M1 stenosis or occlusion. MCA branches perfused through the M2 and M3 segments, although more distal branch opacification is somewhat limited by bolus timing. Posterior circulation: Vertebral arteries patent to the vertebrobasilar junction without significant stenosis. Posterior inferior cerebellar arteries patent proximally. Basilar patent to its distal aspect without significant stenosis. Superior cerebellar arteries patent proximally. Patent, diminutive P1 segments. Near fetal origin of the bilateral PCAs from the posterior communicating arteries. PCAs perfused to their distal aspects without significant stenosis. Venous sinuses: Patent. Anatomic variants: Near fetal origin of the bilateral PCAs. Review of the MIP images confirms the above findings IMPRESSION: 1. No intracranial large vessel occlusion or significant stenosis. 2. No hemodynamically  significant stenosis in the neck. Electronically Signed   By: Merilyn Baba M.D.   On: 11/19/2022 21:35   CT HEAD WO CONTRAST  Result Date: 11/19/2022 CLINICAL DATA:  Neuro deficit, acute, stroke suspected EXAM: CT HEAD WITHOUT CONTRAST TECHNIQUE: Contiguous axial images were obtained from the base of the skull through the vertex without intravenous contrast. RADIATION DOSE REDUCTION: This exam was performed according to the departmental dose-optimization program which includes automated exposure control, adjustment of the mA and/or kV according to patient size and/or use of iterative reconstruction technique. COMPARISON:  MRI 04/20/2022 FINDINGS: Brain: Normal anatomic configuration. No abnormal intra or extra-axial mass lesion or fluid collection. No abnormal mass effect or midline shift. No evidence of acute intracranial hemorrhage or infarct. Ventricular size is normal. Cerebellum unremarkable. Vascular: Unremarkable Skull: Intact Sinuses/Orbits: Paranasal sinuses are clear. Orbits are unremarkable. Other: Mastoid air cells and middle ear cavities are clear. IMPRESSION: 1. No acute intracranial hemorrhage or infarct. Electronically Signed   By: Fidela Salisbury M.D.   On: 11/19/2022 20:48    Pending Labs Unresulted Labs (From admission, onward)    None       Vitals/Pain Today's Vitals   11/20/22 0745  11/20/22 0800 11/20/22 0815 11/20/22 0830  BP: (!) 155/96 (!) 162/92 (!) 137/109 (!) 164/100  Pulse: 62 61 (!) 58 61  Resp: 14 15 10 13   Temp:      TempSrc:      SpO2: 99% 100% 99% 98%  Weight:      Height:      PainSc:        Isolation Precautions No active isolations  Medications Medications  sodium chloride flush (NS) 0.9 % injection 3 mL (3 mLs Intravenous Not Given 11/19/22 2148)  iohexol (OMNIPAQUE) 350 MG/ML injection 100 mL (100 mLs Intravenous Contrast Given 11/19/22 2104)  aspirin chewable tablet 81 mg (81 mg Oral Given 11/20/22 0117)  clopidogrel (PLAVIX) tablet 75 mg (75 mg  Oral Given 11/20/22 0117)  ondansetron (ZOFRAN-ODT) disintegrating tablet 8 mg (8 mg Oral Given 11/20/22 [id number])    Mobility walks with person assist     Focused Assessments Neuro Assessment Handoff:  Swallow screen pass? Yes    NIH Stroke Scale  Dizziness Present: Yes Headache Present: Yes Interval: Shift assessment Level of Consciousness (1a.)   : Alert, keenly responsive LOC Questions (1b. )   : Answers both questions correctly LOC Commands (1c. )   : Performs both tasks correctly Best Gaze (2. )  : Normal Visual (3. )  : No visual loss Facial Palsy (4. )    : Normal symmetrical movements Motor Arm, Left (5a. )   : No drift Motor Arm, Right (5b. ) : No drift Motor Leg, Left (6a. )  : Drift Motor Leg, Right (6b. ) : No drift Limb Ataxia (7. ): Present in one limb Sensory (8. )  : Mild-to-moderate sensory loss, patient feels pinprick is less sharp or is dull on the affected side, or there is a loss of superficial pain with pinprick, but patient is aware of being touched Best Language (9. )  : No aphasia Dysarthria (10. ): Normal Extinction/Inattention (11.)   : No Abnormality Complete NIHSS TOTAL: 3 Last date known well: 11/18/22 Last time known well: 2200 Neuro Assessment: Exceptions to WDL Neuro Checks:   Initial (11/20/22 0130)  Has TPA been given? No If patient is a Neuro Trauma and patient is going to OR before floor call report to [place] nurse: [phone] or [phone]   R Recommendations: See Admitting Provider Note  Report given to:   Additional Notes:

## 2022-11-20 NOTE — ED Notes (Signed)
Called [facility] for transport to [facility] @11 :04am

## 2022-11-20 NOTE — Progress Notes (Signed)
OT Cancellation Note  Patient Details Name: [name] MRN: [id number] DOB: [birth year]   Cancelled Treatment:    Reason Eval/Treat Not Completed: Patient at procedure or test/ unavailable. Pt in MRI.  [place] Office [phone]    Almon Register 11/20/2022, 2:40 PM

## 2022-11-20 NOTE — ED Notes (Signed)
Called Bed Placement for update on room.  Discharges are showing so should be soon for room.

## 2022-11-20 NOTE — ED Notes (Signed)
Patient resting with eyes closed. Respirations even and unlabored.

## 2022-11-20 NOTE — ED Provider Notes (Signed)
Care of the patient assumed at the change of shift. Here with L leg weakness now about 24 hours. Awaiting Neurology consult Physical Exam  BP (!) 160/84 (BP Location: Right Arm)   Pulse 67   Temp 98.3 F (36.8 C) (Oral)   Resp 16   Ht 5\' 11"  (1.803 m)   Wt 112 kg   SpO2 99%   BMI 34.45 kg/m   Physical Exam Mild proximal weakness of L leg, no weakness distally. Other extremities are normal.  No other focal deficit Procedures  Procedures  ED Course / MDM   Clinical Course as of 11/20/22 0116  Mon Nov 19, 2022  2037 Comprehensive metabolic panel wnl [HN]  [id number] CBC(!) Mildly decreased Hgb, otherwise unremarkable [HN]  2037 Alcohol, Ethyl (B): <10 [HN]  2037 Glucose-Capillary: 83 [HN]  2143 CT ANGIO HEAD NECK W WO CM 1. No intracranial large vessel occlusion or significant stenosis. 2. No hemodynamically significant stenosis in the neck.   [HN]  2143 Consulted to neurology for persistent neuro symptom [HN]  2205 Paging neurology [HN]  2253 Rapid urine drug screen (hospital performed) Neg [HN]  2331 Awaiting neurology, repaging [HN]  Tue Nov 20, 2022  0008 While awaiting Neurology consult I spoke with the patient regarding her current symptoms, results and plan (initially attempted Teleneurology consult, but significant delay per prior MD). She is outside of treatment window for CVA and has minimal symptoms now. She does not want to stay for Teleneurology consult or for potential transfer to [facility] for MRI. She will call her PCP tomorrow to arrange outpatient follow up. Recommend she begin taking ASA in the meantime. She was advised to return to ED, preferably [facility] if her symptoms worsen or if she is unable to arrange close outpatient followup [CS]  0023 While awaiting discharge, the teleneurologist was able to connect and evaluate the patient. She has recommended admission for further workup. I discussed this with the patient and she is now amenable to admission. Will page the  hospitalist. Begin ASA and Plavix  [CS]  0115 Spoke with Dr. Sidney Ace, Hospitalist, who will accept for admission.  [CS]    Clinical Course User Index [CS] Truddie Hidden, MD [HN] Audley Hose, MD   Medical Decision Making Problems Addressed: Weakness of left lower extremity: acute illness or injury  Amount and/or Complexity of Data Reviewed Labs: ordered. Decision-making details documented in ED Course. Radiology: ordered. Decision-making details documented in ED Course. ECG/medicine tests: ordered and independent interpretation performed. Decision-making details documented in ED Course.  Risk Prescription drug management. Decision regarding hospitalization.          Truddie Hidden, MD 11/20/22 [phone]

## 2022-11-20 NOTE — Discharge Instructions (Signed)
Please start taking a full sized aspirin daily until your follow-up.    General, Healthful Nutrition Therapy  This handout provides you with the information you'll need to follow a general, healthful diet, which can be tailored to your personal preferences. There are several benefits to following a general, healthful diet: It could mean less calories, less salt, less added sugars, and less saturated fat than many other diets. This outcome will depend on the foods you choose. Eating more whole grains, beans, lentils, fruits, vegetables, nuts, and seeds may improve how much fiber, vitamins, and minerals you eat.  It can lower your risk for health conditions like diabetes, heart disease, hypertension, stroke, and cancer. Your registered dietitian nutritionist (RDN) may recommend portion sizes based on your individual needs and personal and cultural preferences.  Tips Every day, eat a variety of fruits and vegetables in a variety of colors. Be sure to include lots of dark green, red, blue-purple, and orange vegetables. Choose whole grains for at least half of your grain selections. Eat more beans, peas, and lentils. Try meatless alternatives. Get protein in your diet from eggs, fish, poultry, beans, peas, lentils, and nuts/nut butters. Low-fat or fat-free dairy products are also good sources of protein. Keep your salt intake to a minimum (less than 2300 milligrams per day). Limit use of salt, soy sauce, or fish sauce when cooking. Eat freshly prepared meals at home. Processed, prepackaged, and restaurant foods contain more salt. Choose fresh fruits and vegetables for snacks. Choose products with lower sodium content when grocery shopping. Limit your daily sugar intake. Sugar may be used in sauces, marinades, dressings, and condiments - even those that do not taste sweet.  Sugar can be found in honey, syrups, jelly, fruit juice, and fruit juice concentrate. Limit sugar-sweetened beverages like  sodas and fruit juice, sugary snacks, and candy. It's best to choose products without added sugar, but if you do eat them, read labels carefully so you know how much sugar is in each portion. It is better to eat unsaturated fats than saturated fats. Use fats and oils in moderation, up to 5 servings per day. Unsaturated fat is found in fish, avocado, nuts, and oils like sunflower, canola, avocado and olive oils. Saturated fat is found in fatty meat, butter, ice cream, palm and coconut oil, cream, cheese, and lard. Many processed foods, fried foods, fast food items, convenience foods like frozen pizza and snack foods, and sweets including pies, cookies, and other pastries are high in fat. Check nutrition labels and choose these foods less often. Use vegetable oil instead of lard or butter for cooking. Boil, steam, or bake your food instead of deep frying in oil. Remove the fatty part of meats before cooking.  Foods to Choose or to Limit Choose a healthful balance of foods from each food group at your meals. Your RDN may make individualized portion size recommendations based on your needs. Food Group Foods to Choose Foods to Limit  Grains Whole wheat, barley, rye, buckwheat, corn, teff, quinoa, millet, amaranth, brown and wild rice, sorghum, and oats; focus on intact cooked whole grains Grain products, such as bread, rolls, prepared breakfast cereals, crackers, and pasta made from whole grains that are low in added sugars, saturated fat, and sodium Sweetened, low-fiber breakfast cereals Packaged (high sugar, refined ingredients) baked goods Snack crackers and chips made of refined ingredients, cheese crackers, butter crackers Breads made with refined ingredients and saturated fats, such as biscuits, frozen waffles, sweet breads, doughnuts, pastries, packaged baking  mixes, pancakes, cakes, and cookies  Protein Foods Meat, including lean, trimmed cuts of beef, pork, or lamb a few times per week or  less Poultry, including skinless chicken or [place] Seafood, including fish, shrimp, lobster, clams, and scallops at least twice per week. Focus on fatty fish, such as salmon, herring, and sardines, as a rich source of omega-3 fatty acids Eggs Nuts and seeds, such as peanuts, almonds, pistachios, and sunflower seeds (unsalted varieties) Nut and seed butters, such as peanut butter, almond butter, and sunflower seed butter (reduced-sodium varieties)  Soy foods such as tofu, tempeh, or soy nuts Plant protein-based meat alternatives, such as veggie burgers and sausages (reduced-sodium varieties) Unsalted beans, lentils, or peas at least a few times per week in place of other protein sources Marbled or fatty red meats (beef, pork, lamb), such as ribs Processed red meats, such as bacon, sausage, and ham Poultry (chicken and [place]) with skin Fried meats, poultry, or fish [employer], shark, and [place] (may contain high levels of mercury) Deli meats, such as pastrami, bologna, or salami Fried eggs Salted beans, peas, lentils, nuts, seeds, or nut/seed butters Meat alternatives with high levels of sodium or saturated fat  Dairy and Dairy Alternatives Low-fat or fat-free milk, yogurt (low in added sugars), cottage cheese, and cheeses Fortified soymilk or soy yogurt Whole milk, cream, cheeses made from whole milk, sour cream Yogurt or ice cream made from whole milk or with added sugar Cream cheese made from whole milk  Vegetables A variety of vegetables, including dark-green, red, blue-purple, and orange vegetables Low-sodium vegetable juices Canned or frozen vegetables with salt, fresh vegetables prepared with salt Fried vegetables Vegetables in cream sauce or cheese sauce Tomato or pasta sauce with high levels of salt or sugar  Fruit A variety of whole fruits, canned fruit packed in water, or dried fruit 100% fruit juice (limited to  cup per day) Fruits packed in syrup or made with added sugar   Fats and Oils Unsaturated vegetable oils, including olive, peanut, and canola oils Vegetable oil-based margarines and spreads Salad dressing and mayonnaise made from unsaturated vegetable oils Solid shortening or partially hydrogenated oils Solid margarine made with hydrogenated or partially hydrogenated oils Butter  Beverages Coffee and tea (unsweetened) Water Sweetened drinks, including sweetened coffee or tea drinks, soda, energy drinks, and sports drinks  Other Prepared foods, including soups, casseroles, salads, baked goods, and snacks made from recommended ingredients, with low levels of added saturated fat, added sugars, or salt Sugary and/or fatty desserts, candy, and other sweets; salt and seasonings that contain salt Fried foods   General, Healthful Diet Sample 1-Day Menu View Nutrient Info Breakfast 1 cup oatmeal  cup blueberries 1 ounce almonds 1 cup 1% milk or fortified soymilk 1 cup unsweetened coffee  Lunch 2 slices whole wheat bread 3 ounces [place] slices 2 lettuce leaves 2 slices tomato 1 ounce reduced-fat, reduced sodium cheese  cup carrot sticks  cup hummus 1 banana 1 cup 1% milk or fortified soymilk 1 cup unsweetened tea  Evening Meal 4 ounces salmon, baked  cup cooked brown rice 1 cup green beans, cooked 1 cup mixed greens salad 1 teaspoon olive oil mixed with vinegar of choice 1 whole wheat dinner roll 1 teaspoon margarine, soft, tub (for roll) 1 cup water  Evening Snack 1 cup low-fat yogurt  cup sliced peaches

## 2022-11-20 NOTE — Progress Notes (Signed)
Plan of Care Note for accepted transfer   Patient: [name] MRN: [id number]   DOA: 11/19/2022  Facility requesting transfer: [facility] Requesting Provider: Truddie Hidden, MD Reason for transfer: Left leg numbness and weakness Facility course: [name] is a 62 y.o. female, who presents today due to concerns of a headache, blurred vision, and weakness and numbness of the left lower extremity that started abruptly around 7:00 last evening.  She reports last night she was in the car driving and got an acute onset of a headache, located primarily to the frontal region midline.  She also developed blurred vision at the same time.  Additionally, patient felt like her left leg was weaker than normal, but she denies weakness elsewhere.  No personal history of stroke.  Does have history of DVT and PE in the past, but states she was on anticoagulation 20 something years ago and nothing recently.  She denies blind spots in her vision.  Imitrex is a medication listed on her profile, but she is currently denying a history of migraines.  She denies this being the worst headache of her life, but does state it occurred abruptly and has not improved over the past 24 hours.  She denies slurred speech, gait disturbance, weakness of additional extremities, numbness or tingling, chest pain. Notes that she stopped taking Ozempic abruptly roughly three weeks ago.  Upon presentation to the emergency room,, temperature was 99.2 with otherwise normal vital signs.  Labs revealed unremarkable CMP.  CBC showed mild anemia.  Coag profile was within normal.  UA was unremarkable.  Alcohol level was less than 10.  Urine drug screen came back negative.  Noncontrasted head CT scan revealed no acute intracranial abnormalities.  Head and neck CTA revealed the following: 1. No intracranial large vessel occlusion or significant stenosis. 2. No hemodynamically significant stenosis in the neck.  The patient was given aspirin  and Plavix.  Teleneurology consult recommended observation overnight.  Plan of care: The patient is accepted for admission to Telemetry unit, at [facility]..   The patient will be under the care and responsibility of the ER physician until arrival to [facility].  Author: Christel Mormon, MD 11/20/2022  Check [email or web address] for on-call coverage.  Nursing staff, Please call [place] number on Amion as soon as patient's arrival, so appropriate admitting provider can evaluate the pt.

## 2022-11-20 NOTE — H&P (Signed)
History and Physical    Patient: [name] [id number] DOB: Sep 17, 1960 DOA: 11/19/2022 DOS: the patient was seen and examined on 11/20/2022 PCP: Iona Beard, MD  Patient coming from: Home - lives with daughter and granddaughter; NOK: Daughter, Daneil Dan, [phone], son Legrand Pitts, [phone]   Chief Complaint: L-sided weakness, blurry vision  HPI: [name] is a 62 y.o. female with medical history significant of recurrent DVT/PT and HTN presenting with L-sided weakness and blurry vision.  She reports that she was driving.  The day before she noticed her left leg was weak.  Yesterday, she wasn't really feeling herself and when she started driving her vision was getting blurry while driving.  Her leg still feels like it will give out with walking.  She was feeling nauseated.  Teleneurologist was thinking of sending her home but they decided she should have an MRI.  No prior h/o CVA or TIA but has a strong FH on her mother's side.  She is currently noticing headache, frontal region.  She was given nausea medication at [facility] but she still feels a tad bit nauseated.    ER Course:  [facility] to [facility] transfer, per Dr. Sidney Ace:  Concerns of a headache, blurred vision, and weakness/numbness of LLE that started abruptly around 7:00 last evening while driving - got acute onset of headache, frontal region midline, and blurred vision.  Additionally, left leg was weaker than normal, denies weakness elsewhere.  + history of DVT and PE remotely, not on AC.  Notes that she stopped taking Ozempic abruptly roughly three weeks ago.   Labs, CT, CTA head and neck all unremarkable.   The patient was given aspirin and Plavix.  Teleneurology consult recommended observation overnight.     Review of Systems: As mentioned in the history of present illness. All other systems reviewed and are negative. Past Medical History:  Diagnosis Date   Anemia 2009    Baseline hemoglobin at 11, secondary to  iron deficiency   Arthritis    Breast mass 02/2008    Noted on mammogram March 11, 2008 3 cm simple cyst at 9 to 10:00 position of the right breast as well as multiple other smaller simple cyst, 2.6 cm mass at the 10:00 position of the right breast also representing complex cyst,   Deep vein thrombosis (DVT)     history of multiple  DVTs in 1989, 1989, 2004, on chronic anticoagulation with Coumadin   Diverticulosis    noted colonoscopy 2010   Eczema    Fibroid uterus  2001    status post total abdominal hysterectomy, right and left salpingo-oophorectomy , done by Dr. 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 [name]   BREAST LUMPECTOMY WITH RADIOACTIVE SEED LOCALIZATION Right 07/09/2017   Procedure: RIGHT BREAST  LUMPECTOMY WITH RADIOACTIVE SEED LOCALIZATION;  Surgeon: Erroll Luna, MD;  Location: [facility];  Service: General;  Laterality: Right;   CHOLECYSTECTOMY     1997   MASTECTOMY, PARTIAL  03/2006    left partial mastectomy secondary to fibrocystic disease intraluminal fibroadenoma performed March 28, 2006 done by Dr. [name]   Social History:  reports that she has quit smoking. Her smoking use included cigarettes. She has never used smokeless tobacco. She reports current alcohol use. She reports that she does not  use drugs.  Allergies  Allergen Reactions   Dilaudid [Hydromorphone Hcl] Nausea Only    Family History  Problem Relation Age of Onset   Gout Father    Stroke Other    Cancer Other    Clotting disorder Other     Prior to Admission medications   Medication Sig Start Date End Date Taking? Authorizing Provider  albuterol (VENTOLIN HFA) 108 (90 Base) MCG/ACT inhaler INHALE 1-2 PUFFS BY MOUTH EVERY 6 HOURS AS NEEDED FOR WHEEZE OR SHORTNESS OF BREATH 10/03/22   Iona Beard, MD  amLODipine (NORVASC) 5 MG tablet Take 1 tablet (5 mg total) by mouth daily. 01/05/22   Iona Beard, MD  cyclobenzaprine (FLEXERIL) 5 MG tablet TAKE 1 TABLET BY MOUTH AT BEDTIME AS NEEDED FOR MUSCLE SPASMS. 10/30/22   Iona Beard, MD  ibuprofen (ADVIL) 800 MG tablet Take 1 tablet (800 mg total) by mouth every 8 (eight) hours as needed. 05/01/22   Masters, Joellen Jersey, DO  metFORMIN (GLUCOPHAGE) 500 MG tablet Take 1 tablet (500 mg total) by mouth daily with breakfast. 09/26/22 10/26/22  Delene Ruffini, MD  Multiple Vitamin (MULTIVITAMIN) capsule Take 1 capsule daily by mouth.    [provider]  rosuvastatin (CRESTOR) 20 MG tablet TAKE 1 TABLET BY MOUTH EVERY DAY 03/28/22   Lacinda Axon, MD  Semaglutide, 2 MG/DOSE, (OZEMPIC, 2 MG/DOSE,) 8 MG/3ML SOPN Inject 2 mg into the skin once a week. 10/23/22 01/21/23  Idamae Schuller, MD    Physical Exam: Vitals:   11/20/22 1028 11/20/22 1130 11/20/22 1147 11/20/22 1244  BP:  124/63  (!) 158/99  Pulse:  (!) 59  (!) 59  Resp:  10    Temp: 98.2 F (36.8 C)  98.4 F (36.9 C) 98.6 F (37 C)  TempSrc: Oral   Oral  SpO2:  95%  97%  Weight:      Height:       General:  Appears calm and comfortable and is in NAD, +alopecia Eyes:  EOMI, normal lids, iris ENT:  grossly normal hearing, lips & tongue, mmm Neck:  no LAD, masses or thyromegaly Cardiovascular:  RRR, no m/r/g. No LE edema.  Respiratory:   CTA bilaterally with no wheezes/rales/rhonchi.  Normal respiratory  effort. Abdomen:  soft, NT, ND Skin:  no rash or induration seen on limited exam Musculoskeletal:   no bony abnormality, LLE weakness 4/5 Psychiatric:  blunted mood and affect, speech fluent and appropriate, AOx3 Neurologic:  CN 2-12 grossly intact, moves all extremities in coordinated fashion, sensation reported to be diminished along entire L face and possibly LLE    Radiological Exams on Admission: Independently reviewed - see discussion in A/P where applicable  CT ANGIO HEAD NECK W WO CM  Result Date: 11/19/2022 CLINICAL DATA:  Sudden onset headache last night, left leg weakness and blurred vision EXAM: CT ANGIOGRAPHY HEAD AND NECK TECHNIQUE: Multidetector CT imaging of the head and neck was performed using the standard protocol during bolus administration of  intravenous contrast. Multiplanar CT image reconstructions and MIPs were obtained to evaluate the vascular anatomy. Carotid stenosis measurements (when applicable) are obtained utilizing NASCET criteria, using the distal internal carotid diameter as the denominator. RADIATION DOSE REDUCTION: This exam was performed according to the departmental dose-optimization program which includes automated exposure control, adjustment of the mA and/or kV according to patient size and/or use of iterative reconstruction technique. CONTRAST:  115mL OMNIPAQUE IOHEXOL 350 MG/ML SOLN COMPARISON:  No prior CTA available, correlation is made with 11/19/2022 CT head and 04/06/2017 MRA head and neck FINDINGS: CT HEAD FINDINGS For noncontrast findings, please see same day CT head. CTA NECK FINDINGS Aortic arch: Standard branching. Imaged portion shows no evidence of aneurysm or dissection. No significant stenosis of the major arch vessel origins. Right carotid system: No evidence of dissection, occlusion, or hemodynamically significant stenosis (greater than 50%). Left carotid system: No evidence of dissection, occlusion, or hemodynamically significant stenosis  (greater than 50%). Vertebral arteries: No evidence of dissection, occlusion, or hemodynamically significant stenosis (greater than 50%). Skeleton: No acute osseous abnormality. Degenerative changes in the cervical spine. Other neck: Negative. Upper chest: No focal pulmonary opacity or pleural effusion. Review of the MIP images confirms the above findings CTA HEAD FINDINGS Anterior circulation: Both internal carotid arteries are patent to the termini, without significant stenosis. A1 segments patent. Normal anterior communicating artery. Anterior cerebral arteries are patent to their distal aspects without significant stenosis. No M1 stenosis or occlusion. MCA branches perfused through the M2 and M3 segments, although more distal branch opacification is somewhat limited by bolus timing. Posterior circulation: Vertebral arteries patent to the vertebrobasilar junction without significant stenosis. Posterior inferior cerebellar arteries patent proximally. Basilar patent to its distal aspect without significant stenosis. Superior cerebellar arteries patent proximally. Patent, diminutive P1 segments. Near fetal origin of the bilateral PCAs from the posterior communicating arteries. PCAs perfused to their distal aspects without significant stenosis. Venous sinuses: Patent. Anatomic variants: Near fetal origin of the bilateral PCAs. Review of the MIP images confirms the above findings IMPRESSION: 1. No intracranial large vessel occlusion or significant stenosis. 2. No hemodynamically significant stenosis in the neck. Electronically Signed   By: Merilyn Baba M.D.   On: 11/19/2022 21:35   CT HEAD WO CONTRAST  Result Date: 11/19/2022 CLINICAL DATA:  Neuro deficit, acute, stroke suspected EXAM: CT HEAD WITHOUT CONTRAST TECHNIQUE: Contiguous axial images were obtained from the base of the skull through the vertex without intravenous contrast. RADIATION DOSE REDUCTION: This exam was performed according to the departmental  dose-optimization program which includes automated exposure control, adjustment of the mA and/or kV according to patient size and/or use of iterative reconstruction technique. COMPARISON:  MRI 04/20/2022 FINDINGS: Brain: Normal anatomic configuration. No abnormal intra or extra-axial mass lesion or fluid collection. No abnormal mass effect or midline shift. No evidence of acute intracranial hemorrhage or infarct. Ventricular size is normal. Cerebellum unremarkable. Vascular: Unremarkable Skull: Intact Sinuses/Orbits: Paranasal sinuses are clear. Orbits are unremarkable. Other: Mastoid air cells and middle ear cavities are clear. IMPRESSION: 1. No acute intracranial hemorrhage or infarct. Electronically Signed   By: Fidela Salisbury M.D.   On: 11/19/2022 20:48    EKG: Independently reviewed.  NSR with rate 63; no evidence of acute ischemia   Labs on Admission: I have personally reviewed the available labs and imaging studies at the time of the admission.  Pertinent labs:    Normal CMP Unremarkable CBC INR 1.0 UA: trace Hgb ETOH <10 UDS negative   Assessment  and Plan: Principal Problem:   Stroke-like symptoms Active Problems:   Essential hypertension   Dyslipidemia   Diabetes type 2, controlled   Refusal of blood product   Class 1 obesity due to excess calories with body mass index (BMI) of 34.0 to 34.9 in adult    Stroke-like symptoms -Patient with subacute to acute onset of LLE weakness, blurred vision, headache -Concerning for TIA/CVA -Aspirin has been given to reduce stroke mortality and decrease morbidity -Will place in observation status for CVA/TIA evaluation -Telemetry monitoring -Negative CT/CTA -MRI ordered -Echo was done on 3/7 and showed EF 50-55% -Risk stratification with FLP, A1c; will also check TSH and UDS -Neurology consult -PT/OT/ST/Nutrition Consults  HTN -Allow permissive HTN for now -Treat BP only if >220/120, and then with goal of 15% reduction -Hold  amlodipine and plan to restart in 48-72 hours   HLD -Check FLP -Continue rosuvastatin 20 mg daily   DM -Recent A1c shows good control (A1c 5.7) -Hold home metformin -Will order moderate-scale SSI  Blood product refusal -This is documented in chart so patient was asked about this -She reports that she would only accept blood products from family members -We discussed directed donations and the low likelihood that this would happen in an acute situation -Ongoing outpatient discussion encouraged  Obesity -Body mass index is 34.45 kg/m..  -Weight loss should be encouraged -She was previously on Ozempic but stopped this about 3 weeks ago -Outpatient PCP/bariatric medicine/bariatric surgery f/u encouraged        Advance Care Planning:   Code Status: Full Code - Code status was discussed with the patient and/or family at the time of admission.  The patient would want to receive full resuscitative measures at this time.   Consults: Neurology; PT/OT/ST; nutrition; toc team  DVT Prophylaxis: Lovenox  Family Communication: Brother was present throughout evaluation  Severity of Illness: The appropriate patient status for this patient is OBSERVATION. Observation status is judged to be reasonable and necessary in order to provide the required intensity of service to ensure the patient's safety. The patient's presenting symptoms, physical exam findings, and initial radiographic and laboratory data in the context of their medical condition is felt to place them at decreased risk for further clinical deterioration. Furthermore, it is anticipated that the patient will be medically stable for discharge from the hospital within 2 midnights of admission.   Author: Karmen Bongo, MD 11/20/2022 3:13 PM  For on call review [email or web address].

## 2022-11-20 NOTE — Consult Note (Signed)
[facility] TeleNeurology Consult Services  Stat Consult  Patient Name:   [name], [name] Date of Birth:   10/06/1960 Identification Number:   MRN - [id number] Date of Service:   11/19/2022 22:03:17  Diagnosis:       R26.81 - Unsteady gait  Impression 62y/o woman w/ vascular risk factors presenting for evaluation after having sudden onset of blurry vision, Lt leg weakness and numbness, onset approx 1630 while driving, NIHSS: 2, head CT w/ no acute findings, she is not an IV thrombolytics candidate as is out of the window, CTA head and neck w/ no LVO, no indications for acute NIR. Clinical presentation concerning for small acute lacunar stroke, recommend to admit for close monitoring and stroke work up.   Recommendations: Our recommendations are outlined below.  Diagnostic Studies : MRI head without contrast   (CT head negative stroke still suspected) Please order infectious/metabolic work up per ED/primary team Consider MRI brain w/o gad, 2Decho w/ bubble study  Laboratory Studies : Lipid panel Please order Hemoglobin A1c Please order TSH Please order B12 levels, ESR, CRP  Antithrombotic Medication : Initiate dual antiplatelet therapy with Aspirin 81 mg daily and Clopidogrel 75 mg daily for at least 21 days, then continue aspirin 81mg  daily unless other etiologies for this are identified in work up Please order Permissive hypertension, Antihypertensives with prn for first 24-48 hrs post stroke onset. If BP greater than 220/120 give Labetalol IV or Vasotec IV Please order Statins for LDL goal less than 70 Please order  Nursing Recommendations : IV Fluids, avoid dextrose containing fluids, Maintain euglycemiaNeuro checks q4 hrs x 24 hrs and then per shiftHead of bed 30 degreesContinue with Telemetry  Consultations : Recommend Speech therapy if failed dysphagia screenPhysical therapy/Occupational therapy  DVT Prophylaxis : Choice of Primary  Team  Disposition : Neurology will follow   ----------------------------------------------------------------------------------------------------   Advanced Imaging: CTA Head and Neck Completed.  LVO:No  Patient in not a candidate for NIR    Metrics: [facility] Notification Time: 11/19/2022 21:57:05 Stamp Time: 11/19/2022 22:03:17 Callback Response Time: 11/19/2022 21:57:32  Primary Provider Notified of Diagnostic Impression and Management Plan on: 11/20/2022 00:26:28   CT HEAD: As Per Radiologist CT Head Showed No Acute Hemorrhage or Acute Core Infarct    ----------------------------------------------------------------------------------------------------  Chief Complaint: Lt sided weakness and numbness  History of Present Illness: 62y/o woman w/ HTN, HLD, DM presenting for evaluation after having sudden onset of blurry vision, Lt leg weakness and numbness, onset while driving at [id number]. She reports headache since yesterday, denies dizziness, denies similar episodes in the past.   Past Medical History:      Hypertension      Diabetes Mellitus      Hyperlipidemia  Medications:  No Anticoagulant use  No Antiplatelet use Reviewed EMR for current medications  Allergies:  Reviewed  Social History: Smoking: No Alcohol Use: No Drug Use: No  Family History:  There is no family history of premature cerebrovascular disease pertinent to this consultation  ROS : 14 Points Review of Systems was performed and was negative except mentioned in HPI.  Past Surgical History: There Is No Surgical History Contributory To Today's Visit   Examination: BP(160/84), Pulse(67), Blood Glucose(89) 1A: Level of Consciousness - Alert; keenly responsive + 0 1B: Ask Month and Age - Both Questions Right + 0 1C: Blink Eyes & Squeeze Hands - Performs Both Tasks + 0 2: Test Horizontal Extraocular Movements - Normal + 0 3: Test Visual Fields - No Visual Loss +  0 4: Test  Facial Palsy (Use Grimace if Obtunded) - Normal symmetry + 0 5A: Test Left Arm Motor Drift - No Drift for 10 Seconds + 0 5B: Test Right Arm Motor Drift - No Drift for 10 Seconds + 0 6A: Test Left Leg Motor Drift - Drift, but doesn't hit bed + 1 6B: Test Right Leg Motor Drift - No Drift for 5 Seconds + 0 7: Test Limb Ataxia (FNF/Heel-Shin) - No Ataxia + 0 8: Test Sensation - Mild-Moderate Loss: Less Sharp/More Dull + 1 9: Test Language/Aphasia - Normal; No aphasia + 0 10: Test Dysarthria - Normal + 0 11: Test Extinction/Inattention - No abnormality + 0  NIHSS Score: 2, Lt leg numbness/paresthesias  Spoke with : Dr. Mayra Neer at bedside    Patient / Family was informed the Neurology Consult would occur via TeleHealth consult by way of interactive audio and video telecommunications and consented to receiving care in this manner.  Patient is being evaluated for possible acute neurologic impairment and high probability of imminent or life - threatening deterioration.I spent total of 35 minutes providing care to this patient, including time for face to face visit via telemedicine, review of medical records, imaging studies and discussion of findings with providers, the patient and / or family.   Dr Zada Girt Earl Many   [facility] For Inpatient follow-up with [facility] physician please call [facility] [phone]. This is not an outpatient service. Post hospital discharge, please contact hospital directly.  Please do not communicate with [facility] physicians via secure chat. If you have any questions, Please contact [facility]. Please call or reconsult our service if there are any clinical or diagnostic changes.

## 2022-11-21 DIAGNOSIS — R531 Weakness: Secondary | ICD-10-CM | POA: Diagnosis present

## 2022-11-21 DIAGNOSIS — E119 Type 2 diabetes mellitus without complications: Secondary | ICD-10-CM | POA: Diagnosis present

## 2022-11-21 DIAGNOSIS — R29898 Other symptoms and signs involving the musculoskeletal system: Secondary | ICD-10-CM | POA: Diagnosis not present

## 2022-11-21 DIAGNOSIS — Z6834 Body mass index (BMI) 34.0-34.9, adult: Secondary | ICD-10-CM | POA: Diagnosis not present

## 2022-11-21 DIAGNOSIS — E785 Hyperlipidemia, unspecified: Secondary | ICD-10-CM | POA: Diagnosis present

## 2022-11-21 DIAGNOSIS — E6609 Other obesity due to excess calories: Secondary | ICD-10-CM | POA: Diagnosis present

## 2022-11-21 DIAGNOSIS — Z87891 Personal history of nicotine dependence: Secondary | ICD-10-CM | POA: Diagnosis not present

## 2022-11-21 DIAGNOSIS — M2578 Osteophyte, vertebrae: Secondary | ICD-10-CM | POA: Diagnosis present

## 2022-11-21 DIAGNOSIS — Z86711 Personal history of pulmonary embolism: Secondary | ICD-10-CM | POA: Diagnosis not present

## 2022-11-21 DIAGNOSIS — Z9012 Acquired absence of left breast and nipple: Secondary | ICD-10-CM | POA: Diagnosis not present

## 2022-11-21 DIAGNOSIS — Z832 Family history of diseases of the blood and blood-forming organs and certain disorders involving the immune mechanism: Secondary | ICD-10-CM | POA: Diagnosis not present

## 2022-11-21 DIAGNOSIS — Z823 Family history of stroke: Secondary | ICD-10-CM | POA: Diagnosis not present

## 2022-11-21 DIAGNOSIS — I10 Essential (primary) hypertension: Secondary | ICD-10-CM | POA: Diagnosis present

## 2022-11-21 DIAGNOSIS — M5412 Radiculopathy, cervical region: Secondary | ICD-10-CM | POA: Diagnosis not present

## 2022-11-21 DIAGNOSIS — Z7985 Long-term (current) use of injectable non-insulin antidiabetic drugs: Secondary | ICD-10-CM | POA: Diagnosis not present

## 2022-11-21 DIAGNOSIS — R2990 Unspecified symptoms and signs involving the nervous system: Secondary | ICD-10-CM | POA: Diagnosis not present

## 2022-11-21 DIAGNOSIS — H538 Other visual disturbances: Secondary | ICD-10-CM | POA: Diagnosis present

## 2022-11-21 DIAGNOSIS — M47812 Spondylosis without myelopathy or radiculopathy, cervical region: Secondary | ICD-10-CM | POA: Diagnosis not present

## 2022-11-21 DIAGNOSIS — Z7984 Long term (current) use of oral hypoglycemic drugs: Secondary | ICD-10-CM | POA: Diagnosis not present

## 2022-11-21 DIAGNOSIS — Z885 Allergy status to narcotic agent status: Secondary | ICD-10-CM | POA: Diagnosis not present

## 2022-11-21 DIAGNOSIS — Z86718 Personal history of other venous thrombosis and embolism: Secondary | ICD-10-CM | POA: Diagnosis not present

## 2022-11-21 DIAGNOSIS — Z79899 Other long term (current) drug therapy: Secondary | ICD-10-CM | POA: Diagnosis not present

## 2022-11-21 DIAGNOSIS — Z809 Family history of malignant neoplasm, unspecified: Secondary | ICD-10-CM | POA: Diagnosis not present

## 2022-11-21 LAB — GLUCOSE, CAPILLARY
Glucose-Capillary: 82 mg/dL (ref 70–99)
Glucose-Capillary: 104 mg/dL — ABNORMAL HIGH (ref 70–99)
Glucose-Capillary: 123 mg/dL — ABNORMAL HIGH (ref 70–99)
Glucose-Capillary: 125 mg/dL — ABNORMAL HIGH (ref 70–99)

## 2022-11-21 MED ORDER — METOCLOPRAMIDE HCL 5 MG/ML IJ SOLN
5.0000 mg | Freq: Once | INTRAMUSCULAR | Status: AC
  Administered 2022-11-21: 5 mg via INTRAVENOUS
  Filled 2022-11-21: qty 2

## 2022-11-21 MED ORDER — KETOROLAC TROMETHAMINE 30 MG/ML IJ SOLN
30.0000 mg | Freq: Once | INTRAMUSCULAR | Status: AC
  Administered 2022-11-21: 30 mg via INTRAVENOUS
  Filled 2022-11-21: qty 1

## 2022-11-21 MED ORDER — AMLODIPINE BESYLATE 5 MG PO TABS
5.0000 mg | ORAL_TABLET | Freq: Every day | ORAL | Status: DC
  Filled 2022-11-21: qty 1

## 2022-11-21 MED ORDER — TRAZODONE HCL 50 MG PO TABS: 50.0000 mg | ORAL_TABLET | Freq: Every evening | ORAL | Status: DC | PRN

## 2022-11-21 MED ORDER — ONDANSETRON HCL 4 MG/2ML IJ SOLN: 4.0000 mg | Freq: Four times a day (QID) | INTRAMUSCULAR | Status: DC | PRN

## 2022-11-21 NOTE — Progress Notes (Signed)
  Progress Note   Patient: [name] [id number] DOB: [birth year] DOA: 11/19/2022     0 DOS: the patient was seen and examined on 11/21/2022   Brief hospital course: 62 y.o. female with medical history significant of recurrent DVT/PT and HTN presenting with L-sided weakness and blurry vision.  Also associated with some nausea and headache.    Assessment and Plan: Stroke ruled out -Patient with subacute to acute onset of LLE weakness, blurred vision, headache -MRI brain neg, reviewed -Echo was done on 3/7 and showed EF 50-55% -Neurology consulted. Discussed with Neuro. Concern that issues are more functional. MRI-C was ordered   HTN -Stroke ruled out -Resume norvasc   HLD -Check FLP -Continue rosuvastatin 20 mg daily   DM -Recent A1c shows good control (A1c 5.7) -Hold home metformin while in hospital -continue SSI as needed   Blood product refusal -This is documented in chart -She reports that she would only accept blood products from family members -Dr. Lorin Mercy discussed directed donations and the low likelihood that this would happen in an acute situation -Ongoing outpatient discussion encouraged   Obesity -Body mass index is 34.45 kg/m..  -Weight loss should be encouraged -She was previously on Ozempic but stopped this about 3 weeks ago -Outpatient PCP/bariatric medicine/bariatric surgery f/u encouraged     Subjective: This AM, complaining of headache, nausea, limited PO intake  Physical Exam: Vitals:   11/21/22 0321 11/21/22 0804 11/21/22 1201 11/21/22 1640  BP: 128/73 120/63 127/78 126/88  Pulse: (!) 57 62 67 64  Resp: 18 18 18 16   Temp: 98.6 F (37 C) 98 F (36.7 C) 98.2 F (36.8 C) 98.2 F (36.8 C)  TempSrc: Oral   Oral  SpO2: 96% 99% 99% 95%  Weight:      Height:       General exam: Awake, laying in bed, in nad Respiratory system: Normal respiratory effort, no wheezing Cardiovascular system: regular rate, s1, s2 Gastrointestinal system: Soft,  nondistended, positive BS Central nervous system: CN2-12 grossly intact, strength intact Extremities: Perfused, no clubbing Skin: Normal skin turgor, no notable skin lesions seen Psychiatry: Mood normal // no visual hallucinations   Data Reviewed:  There are no new results to review at this time.  Family Communication: Pt in room, family not at bedside  Disposition: Status is: Observation The patient will require care spanning > 2 midnights and should be moved to inpatient because: Severity of illness  Planned Discharge Destination: Home    Author: Marylu Lund, MD 11/21/2022 5:02 PM  For on call review [email or web address].

## 2022-11-21 NOTE — TOC Initial Note (Signed)
Transition of Care [facility]) - Initial/Assessment Note    Patient Details  Name: [name] MRN: [id number] Date of Birth: 1960-12-02  Transition of Care [facility] CM/SW Contact:    Pollie Friar, RN Phone Number: 11/21/2022, 11:55 AM  Clinical Narrative:                 Pt is from home with her daughter. She states she is home alone during the daytime when daughter is at work. She says daughter is not home today but will be back tomorrow as she is traveling.  Pt drives self as needed. Recommendations are for outpatient therapy. Pt states she is already set up with [facility] on [address] and she wants to continue with them. Information sent to [place].  Pt manages her own medications at home and denies any issues. [facility] [address].  TOC following.  Expected Discharge Plan: OP Rehab Barriers to Discharge: Continued Medical Work up   Patient Goals and CMS Choice     Choice offered to / list presented to : Patient      Expected Discharge Plan and Services   Discharge Planning Services: CM Consult   Living arrangements for the past 2 months: Apartment                                      Prior Living Arrangements/Services Living arrangements for the past 2 months: Apartment Lives with:: Adult Children Patient language and need for interpreter reviewed:: Yes Do you feel safe going back to the place where you live?: Yes            Criminal Activity/Legal Involvement Pertinent to Current Situation/Hospitalization: No - Comment as needed  Activities of Daily Living Home Assistive Devices/Equipment: None ADL Screening (condition at time of admission) Patient's cognitive ability adequate to safely complete daily activities?: Yes Is the patient deaf or have difficulty hearing?: No Does the patient have difficulty seeing, even when wearing glasses/contacts?: No Does the patient have difficulty concentrating, remembering,  or making decisions?: No Patient able to express need for assistance with ADLs?: Yes Does the patient have difficulty dressing or bathing?: No Independently performs ADLs?: Yes (appropriate for developmental age) Does the patient have difficulty walking or climbing stairs?: No Weakness of Legs: Both Weakness of Arms/Hands: None  Permission Sought/Granted                  Emotional Assessment Appearance:: Appears stated age Attitude/Demeanor/Rapport: Engaged Affect (typically observed): Accepting Orientation: : Oriented to Self, Oriented to Place, Oriented to  Time, Oriented to Situation   Psych Involvement: No (comment)  Admission diagnosis:  Left leg weakness [R29.898] Weakness of left lower extremity [R29.898] Patient Active Problem List   Diagnosis Date Noted   Stroke-like symptoms 11/20/2022   Diabetes type 2, controlled 11/20/2022   Refusal of blood product 11/20/2022   Class 1 obesity due to excess calories with body mass index (BMI) of 34.0 to 34.9 in adult 11/20/2022   Tinnitus of right ear 06/06/2022   Hearing loss due to cerumen impaction, right 06/06/2022   Migraines 05/01/2022   Headache 04/10/2022   Prediabetes 01/05/2022   Hematochezia 01/05/2022   Back pain 09/04/2021   Limb cramps 03/30/2021   Atypical chest pain 02/14/2021   DOE (dyspnea on exertion) 02/04/2020   Shoulder impingement syndrome, right 02/04/2020  Depression 05/16/2018   Neuropathy 01/28/2018   Obesity, morbid 09/28/2016   Dyslipidemia 01/27/2014   Headache(784.0) 09/29/2013   Bilateral knee pain 05/25/2013   Eczema 05/12/2013   Insomnia 05/12/2013   Diverticulosis 04/18/2012   Healthcare maintenance 02/07/2011   Rash 11/13/2007   Upper airway cough syndrome 12/03/2006   Essential hypertension 05/29/2006   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   PCP:  Iona Beard, MD Pharmacy:   [facility] [phone] [place], [place] [address] [place] [address] Phone: [phone] Fax: [phone]  [id number] [place], [address] [place] [address] Phone: [phone] Fax: [place], [place] [address] [place] [address] Phone: [phone] Fax: [phone]     Social Determinants of Health (SDOH) Social History: SDOH Screenings   Food Insecurity: No Food Insecurity (11/20/2022)  Recent Concern: Food Insecurity - Food Insecurity Present (09/11/2022)  Housing: Low Risk  (11/20/2022)  Transportation Needs: No Transportation Needs (11/20/2022)  Utilities: Not At Risk (11/20/2022)  Alcohol Screen: Low Risk  (01/03/2022)  Depression (PHQ2-9): Low Risk  (09/11/2022)  Financial Resource Strain: High Risk (01/03/2022)  Physical Activity: Sufficiently Active (01/03/2022)  Social Connections: Moderately Isolated (09/11/2022)  Stress: No Stress Concern Present (01/03/2022)  Tobacco Use: Medium Risk (11/20/2022)   SDOH Interventions: Housing Interventions: Intervention Not Indicated   Readmission Risk Interventions     No data to display

## 2022-11-21 NOTE — Plan of Care (Signed)
Patient resting in bed with call light in reach.

## 2022-11-21 NOTE — Evaluation (Signed)
Physical Therapy Evaluation and Discharge Patient Details Name: [name] MRN: [id number] DOB: December 15, 1960 Today's Date: 11/21/2022  History of Present Illness  Pt is a 62 y.o. F who presents 11/19/2022 with L sided weakness and blurry vision. Normal MRI for age. Significant PMH: recurrent DVT/PE, HTN.  Clinical Impression  Patient evaluated by Physical Therapy with no further acute PT needs identified. PTA, pt lives with her family and is independent. Pt reports sudden onset of LLE weakness and blurry vision on Sunday. Pt reports resolution of blurry vision, however, LLE weakness has lingered. Pt denies pain or numbness/tingling. Noted to have LLE proximal weakness and also mild proximal RLE weakness as well. Pt ambulating 250 ft with no assistive device modI. Negotiated 5 steps with railings without physical assist. Demonstrates slowed gait speed for age and tendency for downward gaze. Pt would greatly benefit from OPPT to address deficits. All education has been completed and the patient has no further questions. PT is signing off. Thank you for this referral.      Recommendations for follow up therapy are one component of a multi-disciplinary discharge planning process, led by the attending physician.  Recommendations may be updated based on patient status, additional functional criteria and insurance authorization.         Assistance Recommended at Discharge PRN  Patient can return home with the following  Assist for transportation    Equipment Recommendations None recommended by PT  Recommendations for Other Services       Functional Status Assessment Patient has had a recent decline in their functional status and demonstrates the ability to make significant improvements in function in a reasonable and predictable amount of time.     Precautions / Restrictions Precautions Precautions: None Restrictions Weight Bearing Restrictions: No      Mobility  Bed Mobility Overal  bed mobility: Modified Independent                  Transfers Overall transfer level: Modified independent                 General transfer comment: from elevated surface    Ambulation/Gait Ambulation/Gait assistance: Modified independent (Device/Increase time) Gait Distance (Feet): 250 Feet Assistive device: None Gait Pattern/deviations: Step-through pattern, Decreased stride length Gait velocity: 1.1 ft/s Gait velocity interpretation: <1.31 ft/sec, indicative of household ambulator   General Gait Details: Tendency for downward gaze, equal step length, increased RLE external rotation  Stairs Stairs: Yes Stairs assistance: Modified independent (Device/Increase time) Stair Management: Two rails Number of Stairs: 5    Wheelchair Mobility    Modified Rankin (Stroke Patients Only) Modified Rankin (Stroke Patients Only) Pre-Morbid Rankin Score: No symptoms Modified Rankin: No significant disability     Balance Overall balance assessment: Mild deficits observed, not formally tested                                           Pertinent Vitals/Pain Pain Assessment Pain Assessment: No/denies pain    Home Living Family/patient expects to be discharged to:: Private residence Living Arrangements: Children Sports coach) Available Help at Discharge: Family Type of Home: Apartment Home Access: Stairs to Games developer of Steps:  (flight)   Home Layout: One level        Prior Function Prior Level of Function : Independent/Modified Independent (retired)  Hand Dominance   Dominant Hand: Right    Extremity/Trunk Assessment   Upper Extremity Assessment Upper Extremity Assessment: Defer to OT evaluation    Lower Extremity Assessment Lower Extremity Assessment: RLE deficits/detail;LLE deficits/detail RLE Deficits / Details: Hip flexion 4/5, knee extension 5/5, ankle  dorsiflexion 4/5 LLE Deficits / Details: Hip flexion 3+/5, knee extension 4/5, ankle dorsiflexion 4/5    Cervical / Trunk Assessment Cervical / Trunk Assessment: Normal  Communication   Communication: No difficulties  Cognition Arousal/Alertness: Awake/alert Behavior During Therapy: WFL for tasks assessed/performed Overall Cognitive Status: Within Functional Limits for tasks assessed                                          General Comments      Exercises     Assessment/Plan    PT Assessment Patient does not need any further PT services  PT Problem List         PT Treatment Interventions      PT Goals (Current goals can be found in the Care Plan section)  Acute Rehab PT Goals Patient Stated Goal: to get back to baseline PT Goal Formulation: All assessment and education complete, DC therapy    Frequency       Co-evaluation               AM-PAC PT "6 Clicks" Mobility  Outcome Measure Help needed turning from your back to your side while in a flat bed without using bedrails?: None Help needed moving from lying on your back to sitting on the side of a flat bed without using bedrails?: None Help needed moving to and from a bed to a chair (including a wheelchair)?: None Help needed standing up from a chair using your arms (e.g., wheelchair or bedside chair)?: None Help needed to walk in hospital room?: None Help needed climbing 3-5 steps with a railing? : None 6 Click Score: 24    End of Session Equipment Utilized During Treatment: Gait belt Activity Tolerance: Patient tolerated treatment well Patient left: in bed;with call bell/phone within reach Nurse Communication: Mobility status PT Visit Diagnosis: Difficulty in walking, not elsewhere classified (R26.2)    Time: [id number] PT Time Calculation (min) (ACUTE ONLY): 37 min   Charges:   PT Evaluation $PT Eval Low Complexity: 1 Low PT Treatments $Therapeutic Activity: 8-22 mins         Wyona Almas, PT, DPT Acute Rehabilitation Services Office [phone]   Deno Etienne 11/21/2022, 8:47 AM

## 2022-11-21 NOTE — Progress Notes (Signed)
SLP Cancellation Note  Patient Details Name: [name] MRN: [id number] DOB: 08-Apr-1961   Cancelled treatment:       Reason Eval/Treat Not Completed: SLP screened, no needs identified, will sign off Pt did not present with any acute speech, language, or cognitive-linguistic deficits on admission, MRI was negative for acute changes, and no overt communication or cognitive-linguistic deficits were noted by physical therapy. A formal evaluation does not appear to be clinically indicated at this time.   Corwin Kuiken I. Hardin Negus, [employer], [facility] Diagnostic Specialist Office number [phone]  Horton Marshall 11/21/2022, 10:02 AM

## 2022-11-21 NOTE — Consult Note (Addendum)
Stroke Neurology Consultation Note  Consult Requested by: Dr. Wyline Copas  Reason for Consult: Left leg weakness and numbness  Consult Date:  11/21/22  The history was obtained from the patient and chart.  During history and examination, all items were  able to obtain unless otherwise noted.  History of Present Illness:  ANELLY DOVE is an 62 y.o. African American female with PMH of DVT, PE and hypertension presenting with acute onset blurry vision, headache and left-sided weakness which occurred while she was driving 2 days ago.  States that blurry vision occurred suddenly and was bilateral with no difficulty seeing on the right or left side.  She states that she had a headache rated 5-7 out of 10 in the frontal area with some nausea.  This headache has persisted.  Date last known well: Date: 11/19/2022 Time last known well: Unable to determine tPA Given: No: Outside of window MRS:  1 NIHSS:  1  Past Medical History:  Diagnosis Date   Anemia 2009    Baseline hemoglobin at 11, secondary to iron deficiency   Arthritis    Breast mass 02/2008    Noted on mammogram March 11, 2008 3 cm simple cyst at 9 to 10:00 position of the right breast as well as multiple other smaller simple cyst, 2.6 cm mass at the 10:00 position of the right breast also representing complex cyst,   Deep vein thrombosis (DVT)     history of multiple  DVTs in 1989, 1989, 2004, on chronic anticoagulation with Coumadin   Diverticulosis    noted colonoscopy 2010   Eczema    Fibroid uterus  2001    status post total abdominal hysterectomy, right and left salpingo-oophorectomy , done by Dr. 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 [name]   BREAST LUMPECTOMY WITH RADIOACTIVE SEED LOCALIZATION Right 07/09/2017   Procedure: RIGHT BREAST LUMPECTOMY WITH RADIOACTIVE SEED LOCALIZATION;  Surgeon: Erroll Luna, MD;  Location: [place];  Service: General;  Laterality: Right;   CHOLECYSTECTOMY     1997   MASTECTOMY, PARTIAL  03/2006    left partial mastectomy secondary to fibrocystic disease intraluminal fibroadenoma performed March 28, 2006 done by Dr. [name]    Family History  Problem Relation Age of Onset   Gout Father    Stroke Other    Cancer Other    Clotting disorder Other      Social History:  reports that she has quit smoking. Her smoking use included cigarettes. She has never used smokeless tobacco. She reports current alcohol use. She reports that she does not use drugs.  Review of Systems: A full ROS was attempted today and was able to be performed.  Systems assessed include - Constitutional, Eyes, HENT, Respiratory, Cardiovascular, Gastrointestinal, Genitourinary,  Integument/breast, Hematologic/lymphatic, Musculoskeletal, Neurological, Behavioral/Psych, Endocrine, Allergic/Immunologic - with pertinent responses as per HPI.  Allergies:  Allergies  Allergen Reactions   Dilaudid [Hydromorphone Hcl] Nausea Only     Medications: I have reviewed the patient's current medications. Prior to Admission:  Medications Prior to Admission  Medication Sig Dispense Refill Last Dose   albuterol (VENTOLIN HFA) 108 (90 Base) MCG/ACT inhaler INHALE 1-2 PUFFS BY MOUTH EVERY 6 HOURS AS NEEDED FOR WHEEZE OR SHORTNESS OF BREATH (Patient taking  differently: Inhale 2 puffs into the lungs every 6 (six) hours as needed for wheezing or shortness of breath.) 8.5 each 1 Past Week   cyclobenzaprine (FLEXERIL) 5 MG tablet TAKE 1 TABLET BY MOUTH AT BEDTIME AS NEEDED FOR MUSCLE SPASMS. (Patient taking differently: Take 5 mg by mouth daily as needed for muscle spasms.) 30 tablet 0 Past Week   Multiple Vitamin (MULTIVITAMIN) capsule Take 1 capsule daily by mouth.   11/19/2022   rosuvastatin (CRESTOR) 20 MG tablet TAKE 1 TABLET BY MOUTH EVERY DAY 90 tablet 3 11/19/2022   Semaglutide, 2 MG/DOSE, (OZEMPIC, 2 MG/DOSE,) 8 MG/3ML SOPN Inject 2 mg into the skin once a week. 3 mL 3 10/19/2022   amLODipine (NORVASC) 5 MG tablet Take 1 tablet (5 mg total) by mouth daily. (Patient not taking: Reported on 11/21/2022) 30 tablet 3 Not Taking   metFORMIN (GLUCOPHAGE) 500 MG tablet Take 1 tablet (500 mg total) by mouth daily with breakfast. (Patient not taking: Reported on 11/21/2022) 30 tablet 0 Not Taking   Scheduled:  aspirin EC  81 mg Oral Daily   enoxaparin (LOVENOX) injection  40 mg Subcutaneous Q24H   insulin aspart  0-15 Units Subcutaneous TID WC   insulin aspart  0-5 Units Subcutaneous QHS   pantoprazole  40 mg Oral Daily   rosuvastatin  20 mg Oral Daily   Continuous:  sodium chloride Stopped (11/21/22 1300)   [id number] **OR** acetaminophen (TYLENOL) oral liquid 160 mg/5 mL **OR** acetaminophen, diphenhydrAMINE, ondansetron (ZOFRAN) IV, senna-docusate  Test Results: CBC:  Recent Labs  Lab 11/19/22 2007  WBC 6.1  NEUTROABS 2.2  HGB 11.5*  HCT 34.8*  MCV 88.1  PLT 123456*   Basic Metabolic Panel:  Recent Labs  Lab 11/19/22 2007  NA 137  K 3.9  CL 106  CO2 22  GLUCOSE 89  BUN 17  CREATININE 0.96  CALCIUM 9.0   Liver Function Tests: Recent Labs  Lab 11/19/22 2007  AST 31  ALT 37  ALKPHOS 59  BILITOT 0.5  PROT 7.3  ALBUMIN 4.0   No results for input(s): "LIPASE", "AMYLASE" in the last 168 hours. No results for input(s):  "AMMONIA" in the last 168 hours. Coagulation Studies:  Recent Labs    11/19/22 2007  LABPROT 13.0  INR 1.0   Cardiac Enzymes: No results for input(s): "CKTOTAL", "CKMB", "CKMBINDEX", "TROPONINI" in the last 168 hours. BNP: Invalid input(s): "POCBNP" CBG:  Recent Labs  Lab 11/19/22 2006 11/20/22 1623 11/20/22 2115 11/21/22 0634 11/21/22 1201  GLUCAP 83 87 109* 82 104*   Urinalysis:  Recent Labs  Lab 11/19/22 2210  COLORURINE YELLOW  LABSPEC 1.020  PHURINE 6.5  GLUCOSEU NEGATIVE  HGBUR TRACE*  [place] NEGATIVE  NITRITE NEGATIVE  LEUKOCYTESUR NEGATIVE   Microbiology:  Results for orders placed or performed during the hospital encounter of 08/12/20  Resp Panel by RT-PCR (Flu A&B, Covid) Nasopharyngeal Swab     Status: None   Collection Time: 08/12/20  9:54 AM  Specimen: Nasopharyngeal Swab; Nasopharyngeal(NP) swabs in vial transport medium  Result Value Ref Range Status   SARS Coronavirus 2 by RT PCR NEGATIVE NEGATIVE Final    Comment: (NOTE) SARS-CoV-2 target nucleic acids are NOT DETECTED.  The SARS-CoV-2 RNA is generally detectable in upper respiratory specimens during the acute phase of infection. The lowest concentration of SARS-CoV-2 viral copies this assay can detect is 138 copies/mL. A negative result does not preclude SARS-Cov-2 infection and should not be used as the sole basis for treatment or other patient management decisions. A negative result may occur with  improper specimen collection/handling, submission of specimen other than nasopharyngeal swab, presence of viral mutation(s) within the areas targeted by this assay, and inadequate number of viral copies(<138 copies/mL). A negative result must be combined with clinical observations, patient history, and epidemiological information. The expected result is Negative.  Fact Sheet for Patients:  EntrepreneurPulse.com.au  Fact Sheet for  Healthcare Providers:  IncredibleEmployment.be  This test is no t yet approved or cleared by the [place] FDA and  has been authorized for detection and/or diagnosis of SARS-CoV-2 by FDA under an Emergency Use Authorization (EUA). This EUA will remain  in effect (meaning this test can be used) for the duration of the COVID-19 declaration under Section 564(b)(1) of the Act, 21 U.S.C.section 360bbb-3(b)(1), unless the authorization is terminated  or revoked sooner.       Influenza A by PCR NEGATIVE NEGATIVE Final   Influenza B by PCR NEGATIVE NEGATIVE Final    Comment: (NOTE) The Xpert Xpress SARS-CoV-2/FLU/RSV plus assay is intended as an aid in the diagnosis of influenza from Nasopharyngeal swab specimens and should not be used as a sole basis for treatment. Nasal washings and aspirates are unacceptable for Xpert Xpress SARS-CoV-2/FLU/RSV testing.  Fact Sheet for Patients: EntrepreneurPulse.com.au  Fact Sheet for Healthcare Providers: IncredibleEmployment.be  This test is not yet approved or cleared by the [place] FDA and has been authorized for detection and/or diagnosis of SARS-CoV-2 by FDA under an Emergency Use Authorization (EUA). This EUA will remain in effect (meaning this test can be used) for the duration of the COVID-19 declaration under Section 564(b)(1) of the Act, 21 U.S.C. section 360bbb-3(b)(1), unless the authorization is terminated or revoked.  Performed at [facility], [place] [address], [place], [place] [address]    Lipid Panel:     Component Value Date/Time   CHOL 152 11/20/2022 1448   CHOL 155 08/31/2021 1443   TRIG 46 11/20/2022 1448   HDL 51 11/20/2022 1448   HDL 55 08/31/2021 1443   CHOLHDL 3.0 11/20/2022 1448   VLDL 9 11/20/2022 1448   LDLCALC 92 11/20/2022 1448   LDLCALC 84 08/31/2021 1443   HgbA1c:  Lab Results  Component Value Date   HGBA1C 5.7 (H)  09/11/2022   Urine Drug Screen:     Component Value Date/Time   LABOPIA NONE DETECTED 11/19/2022 2210   COCAINSCRNUR NONE DETECTED 11/19/2022 2210   LABBENZ NONE DETECTED 11/19/2022 2210   AMPHETMU NONE DETECTED 11/19/2022 2210   THCU NONE DETECTED 11/19/2022 2210   LABBARB NONE DETECTED 11/19/2022 2210    Alcohol Level:  Recent Labs  Lab 11/19/22 2007  ETH <10    MR BRAIN WO CONTRAST  Result Date: 11/20/2022 CLINICAL DATA:  Stroke, follow-up. EXAM: MRI HEAD WITHOUT CONTRAST TECHNIQUE: Multiplanar, multiecho pulse sequences of the brain and surrounding structures were obtained without intravenous contrast. COMPARISON:  Head CT and CTA head/neck 11/19/2022. FINDINGS: Brain: No acute  infarct or hemorrhage. Mild chronic small-vessel disease. No foci of abnormal susceptibility. No hydrocephalus or extra-axial collection. No mass or midline shift. Vascular: Normal flow voids. Skull and upper cervical spine: Normal marrow signal. Sinuses/Orbits: Unremarkable. Other: None. IMPRESSION: Normal brain MRI for age. Electronically Signed   By: Emmit Alexanders M.D.   On: 11/20/2022 15:53   CT ANGIO HEAD NECK W WO CM  Result Date: 11/19/2022 CLINICAL DATA:  Sudden onset headache last night, left leg weakness and blurred vision EXAM: CT ANGIOGRAPHY HEAD AND NECK TECHNIQUE: Multidetector CT imaging of the head and neck was performed using the standard protocol during bolus administration of intravenous contrast. Multiplanar CT image reconstructions and MIPs were obtained to evaluate the vascular anatomy. Carotid stenosis measurements (when applicable) are obtained utilizing NASCET criteria, using the distal internal carotid diameter as the denominator. RADIATION DOSE REDUCTION: This exam was performed according to the departmental dose-optimization program which includes automated exposure control, adjustment of the mA and/or kV according to patient size and/or use of iterative reconstruction technique.  CONTRAST:  137mL OMNIPAQUE IOHEXOL 350 MG/ML SOLN COMPARISON:  No prior CTA available, correlation is made with 11/19/2022 CT head and 04/06/2017 MRA head and neck FINDINGS: CT HEAD FINDINGS For noncontrast findings, please see same day CT head. CTA NECK FINDINGS Aortic arch: Standard branching. Imaged portion shows no evidence of aneurysm or dissection. No significant stenosis of the major arch vessel origins. Right carotid system: No evidence of dissection, occlusion, or hemodynamically significant stenosis (greater than 50%). Left carotid system: No evidence of dissection, occlusion, or hemodynamically significant stenosis (greater than 50%). Vertebral arteries: No evidence of dissection, occlusion, or hemodynamically significant stenosis (greater than 50%). Skeleton: No acute osseous abnormality. Degenerative changes in the cervical spine. Other neck: Negative. Upper chest: No focal pulmonary opacity or pleural effusion. Review of the MIP images confirms the above findings CTA HEAD FINDINGS Anterior circulation: Both internal carotid arteries are patent to the termini, without significant stenosis. A1 segments patent. Normal anterior communicating artery. Anterior cerebral arteries are patent to their distal aspects without significant stenosis. No M1 stenosis or occlusion. MCA branches perfused through the M2 and M3 segments, although more distal branch opacification is somewhat limited by bolus timing. Posterior circulation: Vertebral arteries patent to the vertebrobasilar junction without significant stenosis. Posterior inferior cerebellar arteries patent proximally. Basilar patent to its distal aspect without significant stenosis. Superior cerebellar arteries patent proximally. Patent, diminutive P1 segments. Near fetal origin of the bilateral PCAs from the posterior communicating arteries. PCAs perfused to their distal aspects without significant stenosis. Venous sinuses: Patent. Anatomic variants: Near  fetal origin of the bilateral PCAs. Review of the MIP images confirms the above findings IMPRESSION: 1. No intracranial large vessel occlusion or significant stenosis. 2. No hemodynamically significant stenosis in the neck. Electronically Signed   By: Merilyn Baba M.D.   On: 11/19/2022 21:35   CT HEAD WO CONTRAST  Result Date: 11/19/2022 CLINICAL DATA:  Neuro deficit, acute, stroke suspected EXAM: CT HEAD WITHOUT CONTRAST TECHNIQUE: Contiguous axial images were obtained from the base of the skull through the vertex without intravenous contrast. RADIATION DOSE REDUCTION: This exam was performed according to the departmental dose-optimization program which includes automated exposure control, adjustment of the mA and/or kV according to patient size and/or use of iterative reconstruction technique. COMPARISON:  MRI 04/20/2022 FINDINGS: Brain: Normal anatomic configuration. No abnormal intra or extra-axial mass lesion or fluid collection. No abnormal mass effect or midline shift. No evidence of acute intracranial hemorrhage or  infarct. Ventricular size is normal. Cerebellum unremarkable. Vascular: Unremarkable Skull: Intact Sinuses/Orbits: Paranasal sinuses are clear. Orbits are unremarkable. Other: Mastoid air cells and middle ear cavities are clear. IMPRESSION: 1. No acute intracranial hemorrhage or infarct. Electronically Signed   By: Fidela Salisbury M.D.   On: 11/19/2022 20:48   ECHOCARDIOGRAM COMPLETE  Result Date: 10/25/2022    ECHOCARDIOGRAM REPORT   Patient Name:   AURELIANA WASSMANN Date of Exam: 10/25/2022 Medical Rec #:  [id number]         Height:       71.0 in Accession #:    [id number]        Weight:       237.4 lb Date of Birth:  1961/08/03         BSA:          2.268 m Patient Age:    18 years          BP:           125/87 mmHg Patient Gender: F                 HR:           69 bpm. Exam Location:  Outpatient Procedure: 2D Echo, Cardiac Doppler and Color Doppler Indications:    Dyspnea R06.00  History:         Patient has no prior history of Echocardiogram examinations.                 Signs/Symptoms:Dyspnea; Risk Factors:Hypertension.  Sonographer:    Bernadene Person RDCS Referring Phys: [id number] [place]  1. Left ventricular ejection fraction, by estimation, is 50 to 55%. The left ventricle has low normal function. The left ventricle has no regional wall motion abnormalities. Left ventricular diastolic parameters were normal.  2. Right ventricular systolic function is normal. The right ventricular size is normal.  3. The mitral valve is grossly normal. Trivial mitral valve regurgitation. No evidence of mitral stenosis.  4. The aortic valve is grossly normal. There is mild calcification of the aortic valve. Aortic valve regurgitation is not visualized. Aortic valve sclerosis is present, with no evidence of aortic valve stenosis.  5. The inferior vena cava is normal in size with greater than 50% respiratory variability, suggesting right atrial pressure of 3 mmHg. Comparison(s): No prior Echocardiogram. Conclusion(s)/Recommendation(s): Otherwise normal echocardiogram, with minor abnormalities described in the report. FINDINGS  Left Ventricle: Left ventricular ejection fraction, by estimation, is 50 to 55%. The left ventricle has low normal function. The left ventricle has no regional wall motion abnormalities. The left ventricular internal cavity size was normal in size. There is no left ventricular hypertrophy. Left ventricular diastolic parameters were normal. Right Ventricle: The right ventricular size is normal. Right vetricular wall thickness was not well visualized. Right ventricular systolic function is normal. Left Atrium: Left atrial size was normal in size. Right Atrium: Right atrial size was normal in size. Pericardium: There is no evidence of pericardial effusion. Mitral Valve: The mitral valve is grossly normal. Trivial mitral valve regurgitation. No evidence of mitral valve stenosis.  Tricuspid Valve: The tricuspid valve is grossly normal. Tricuspid valve regurgitation is trivial. No evidence of tricuspid stenosis. Aortic Valve: The aortic valve is grossly normal. There is mild calcification of the aortic valve. Aortic valve regurgitation is not visualized. Aortic valve sclerosis is present, with no evidence of aortic valve stenosis. Pulmonic Valve: The pulmonic valve was not well visualized. Pulmonic valve regurgitation is not visualized. No  evidence of pulmonic stenosis. Aorta: The aortic root, ascending aorta and aortic arch are all structurally normal, with no evidence of dilitation or obstruction. Venous: The inferior vena cava is normal in size with greater than 50% respiratory variability, suggesting right atrial pressure of 3 mmHg. IAS/Shunts: The atrial septum is grossly normal.  LEFT VENTRICLE PLAX 2D LVIDd:         5.70 cm      Diastology LVIDs:         3.60 cm      LV e' medial:    8.10 cm/s LV PW:         0.80 cm      LV E/e' medial:  5.0 LV IVS:        0.70 cm      LV e' lateral:   9.15 cm/s LVOT diam:     2.10 cm      LV E/e' lateral: 4.4 LV SV:         70 LV SV Index:   31 LVOT Area:     3.46 cm  LV Volumes (MOD) LV vol d, MOD A2C: 72.9 ml LV vol d, MOD A4C: 103.0 ml LV vol s, MOD A2C: 34.1 ml LV vol s, MOD A4C: 48.4 ml LV SV MOD A2C:     38.8 ml LV SV MOD A4C:     103.0 ml LV SV MOD BP:      47.1 ml RIGHT VENTRICLE RV S prime:     12.70 cm/s TAPSE (M-mode): 2.2 cm LEFT ATRIUM             Index        RIGHT ATRIUM           Index LA diam:        3.20 cm 1.41 cm/m   RA Area:     13.00 cm LA Vol (A2C):   36.6 ml 16.14 ml/m  RA Volume:   28.50 ml  12.57 ml/m LA Vol (A4C):   36.1 ml 15.92 ml/m LA Biplane Vol: 36.4 ml 16.05 ml/m  AORTIC VALVE LVOT Vmax:   105.00 cm/s LVOT Vmean:  66.000 cm/s LVOT VTI:    0.201 m  AORTA Ao Root diam: 3.20 cm Ao Asc diam:  3.60 cm MITRAL VALVE MV Area (PHT): 3.99 cm    SHUNTS MV Decel Time: 190 msec    Systemic VTI:  0.20 m MV E velocity: 40.40  cm/s  Systemic Diam: 2.10 cm MV A velocity: 52.60 cm/s MV E/A ratio:  0.77 Buford Dresser MD Electronically signed by Buford Dresser MD Signature Date/Time: 10/25/2022/4:19:31 PM    Final    VAS [place] LOWER EXTREMITY VENOUS (DVT)  Result Date: 10/25/2022  Lower Venous DVT Study Patient Name:  TAYLIA PESKA  Date of Exam:   10/25/2022 Medical Rec #: [id number]          Accession #:    [id number] Date of Birth: 1961-08-02          Patient Gender: F Patient Age:   1 years Exam Location:  [facility] Procedure:      VAS [place] LOWER EXTREMITY VENOUS (DVT) Referring Phys: GRACE LAU --------------------------------------------------------------------------------  Indications: Pain, and chronic cramping and spasms in feet, SOB for 18 years.  Risk Factors: Hx in [id number], and 2004 DVT hx in 1989 and 2004. Comparison Study: No prior study. Performing Technologist: McKayla Maag RVT, [place]  Examination Guidelines: A complete evaluation includes B-mode imaging, spectral Doppler, color Doppler,  and power Doppler as needed of all accessible portions of each vessel. Bilateral testing is considered an integral part of a complete examination. Limited examinations for reoccurring indications may be performed as noted. The reflux portion of the exam is performed with the patient in reverse Trendelenburg.  +---------+---------------+---------+-----------+----------+--------------+ RIGHT    CompressibilityPhasicitySpontaneityPropertiesThrombus Aging +---------+---------------+---------+-----------+----------+--------------+ CFV      Full           Yes      Yes                                 +---------+---------------+---------+-----------+----------+--------------+ SFJ      Full                                                        +---------+---------------+---------+-----------+----------+--------------+ FV Prox  Full                                                         +---------+---------------+---------+-----------+----------+--------------+ FV Mid   Full                                                        +---------+---------------+---------+-----------+----------+--------------+ FV DistalFull                                                        +---------+---------------+---------+-----------+----------+--------------+ PFV      Full                                                        +---------+---------------+---------+-----------+----------+--------------+ POP      Full           Yes      Yes                                 +---------+---------------+---------+-----------+----------+--------------+ PTV      Full                                                        +---------+---------------+---------+-----------+----------+--------------+ PERO     Full                                                        +---------+---------------+---------+-----------+----------+--------------+   +---------+---------------+---------+-----------+----------+--------------+  LEFT     CompressibilityPhasicitySpontaneityPropertiesThrombus Aging +---------+---------------+---------+-----------+----------+--------------+ CFV      Full           Yes      Yes                                 +---------+---------------+---------+-----------+----------+--------------+ SFJ      Full                                                        +---------+---------------+---------+-----------+----------+--------------+ FV Prox  Full                                                        +---------+---------------+---------+-----------+----------+--------------+ FV Mid   Full                                                        +---------+---------------+---------+-----------+----------+--------------+ FV DistalFull                                                         +---------+---------------+---------+-----------+----------+--------------+ PFV      Full                                                        +---------+---------------+---------+-----------+----------+--------------+ POP      Full           Yes      Yes                                 +---------+---------------+---------+-----------+----------+--------------+ PTV      Full                                                        +---------+---------------+---------+-----------+----------+--------------+ PERO     Full                                                        +---------+---------------+---------+-----------+----------+--------------+     Summary: BILATERAL: - No evidence of deep vein thrombosis seen in the lower extremities, bilaterally. - No evidence of superficial venous thrombosis in the lower extremities, bilaterally. -No evidence of popliteal cyst, bilaterally.   *See table(s) above for measurements and observations. Electronically  signed by Orlie Pollen on 10/25/2022 at 4:18:40 PM.    Final      EKG: normal EKG, normal sinus rhythm   Physical Examination: Temp:  [97.9 F (36.6 C)-98.6 F (37 C)] 98.2 F (36.8 C) (04/03 1201) Pulse Rate:  [53-67] 67 (04/03 1201) Resp:  [18-22] 18 (04/03 1201) BP: (120-132)/(63-78) 127/78 (04/03 1201) SpO2:  [93 %-99 %] 99 % (04/03 1201)  General - Well nourished, well developed, in no apparent distress.  Cardiovascular - Regular rate and rhythm.  Mental Status -  Level of arousal and orientation to time, place, and person were intact. Language including expression, naming, repetition, comprehension was assessed and found intact. Attention span and concentration were normal.   Cranial Nerves II - XII - II - Visual field intact OU with no blurriness reported. III, IV, VI - Extraocular movements intact. V - Facial sensation intact bilaterally but diminished on the left. VII - Facial movement intact  bilaterally. VIII - Hearing & vestibular intact bilaterally. X - Palate elevates symmetrically. XI - Chin turning & shoulder shrug intact bilaterally. XII - Tongue protrusion intact.  Motor Strength - The patient's strength was/5 in right upper and lower extremities, 4+/5 in left upper and lower extremities and pronator drift was absent.  Bulk was normal and fasciculations were absent.   Motor Tone - Muscle tone was assessed at the neck and appendages and was normal.  Sensory - Light touch, temperature/pinprick were assessed and were intact but slightly diminished on the left  Coordination - The patient had normal movements in the hands and feet with no ataxia or dysmetria.  Tremor was absent.  Gait and Station - deferred.   Assessment:  Ms. AMEYAA ALGARIN is a 62 y.o. female with history of DVT, PE and hypertension presenting with acute onset headache, blurry vision and left-sided weakness and numbness. She did not receive IV t-PA due to outside the window.  MRI was negative for acute stroke, raising question of TIA versus complicated migraine.  As patient's left-sided weakness persists and headache persists as well, will treat headache and see if this improves her deficits.  Does state that she gets headaches from time to time but does not have a documented history of migraines.  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  Therapy recommendations: Outpatient PT/OT Disposition: Home  Hypertension Stable Long-term BP goal normotensive  Hyperlipidemia Home meds: Rosuvastatin 20 mg daily, resumed in hospital LDL 92, goal < 70 Patient requests not to increase dose at this time, will attempt to  lower LDL through diet and exercise, amenable to seeing a dietitian Continue statin same dose at discharge  Other Stroke Risk Factors Former cigarette smoker Obesity, Body mass index is 34.45 kg/m., recommend weight loss, diet and exercise as appropriate  DVT in 1983, PE in 2004, was treated with Coumadin.  Patient stopped Coumadin in 2007, and does not wish to restart.  Other [facility] day # 0   Thank you for this consultation and allowing [place] to participate in the care of this patient.  [place] , MSN, AGACNP-BC [facility] See Amion for schedule and pager information 11/21/2022 2:30 PM  ATTENDING NOTE: I reviewed above note and agree with the assessment and plan. Pt was seen and examined.   Patient lying in bed, no family at bedside.  She stated that Sunday she was getting up from chair had feeling of left leg weakness.  Sunday night she had a headache.  Monday she still felt left leg weakness but able to move around, when she was driving she had a blurry vision lasting 1 to 2 hours, some nausea but no vomiting.  CT, CTA head and neck and MRI negative.  Currently she is still felt left leg weaker than right, also felt left arm not same as right.  Denies any facial numbness or facial droop.  On exam, her left leg was tense, hard to against gravity by herself, however she resisted passive knee flexion with strong strength of knee extension to against gravity. Left UE exam with mild lack of effort.  Overall, examination concerning for functional component.    Patient stated that she was able to walk with PT OT without difficulty although with intermittent left knee buckles.  PT and OT recommend outpatient therapy.  Will check MRI C-spine to rule out spinal cord compression.  Otherwise, no further workup needed.  Continue aspirin and home dose statin.  Will follow.  For detailed assessment and plan, please refer to above/below as I have made  changes wherever appropriate.   Rosalin Hawking, MD PhD Stroke Neurology 11/21/2022 8:26 PM   To contact Stroke Continuity provider, please refer to [email or web address] After hours, contact General Neurology

## 2022-11-21 NOTE — Evaluation (Signed)
Occupational Therapy Evaluation Patient Details Name: [name] MRN: [id number] DOB: February 19, 1961 Today's Date: 11/21/2022   History of Present Illness Pt is a 62 y.o. F who presents 11/19/2022 with L sided weakness and blurry vision. Normal MRI for age. Significant PMH: recurrent DVT/PE, HTN.   Clinical Impression   PTA patient independent and driving. Admitted for above and presents with problem list below, including L sided weakness, decreased sensation in L UE and decreased activity tolerance. Cognition appears WFL, short blessed test WNL (0/28) but pt reports some slow processing. She completes transfers with supervision, LB drsesing with min assist for L sock, and grooming at sink with supervision.  Believe she will benefit from higher level cognitive assessment, as well as further dual cognitive tasks.  She was very fatigued during session, and quickly requested to return back to bed.  Recommend further OT services at dc in outpatient setting, pt reports she will be able to get a ride as needed. Will follow acutely.      Recommendations for follow up therapy are one component of a multi-disciplinary discharge planning process, led by the attending physician.  Recommendations may be updated based on patient status, additional functional criteria and insurance authorization.   Assistance Recommended at Discharge Intermittent Supervision/Assistance  Patient can return home with the following A little help with walking and/or transfers;A little help with bathing/dressing/bathroom;Help with stairs or ramp for entrance;Direct supervision/assist for financial management;Direct supervision/assist for medications management;Assistance with cooking/housework    Functional Status Assessment  Patient has had a recent decline in their functional status and demonstrates the ability to make significant improvements in function in a reasonable and predictable amount of time.  Equipment  Recommendations  Tub/shower seat    Recommendations for Other Services       Precautions / Restrictions Precautions Precautions: None Restrictions Weight Bearing Restrictions: No      Mobility Bed Mobility Overal bed mobility: Modified Independent                  Transfers Overall transfer level: Needs assistance Equipment used: Rolling walker (2 wheels) Transfers: Sit to/from Stand Sit to Stand: Supervision           General transfer comment: increased time to power up      Balance Overall balance assessment: Mild deficits observed, not formally tested                                         ADL either performed or assessed with clinical judgement   ADL Overall ADL's : Needs assistance/impaired     Grooming: Supervision/safety;Standing           Upper Body Dressing : Sitting;Modified independent   Lower Body Dressing: Minimal assistance;Sit to/from stand Lower Body Dressing Details (indicate cue type and reason): require assist to fully don L sock, supervision in standing Toilet Transfer: Supervision/safety;Ambulation Toilet Transfer Details (indicate cue type and reason): simulated in room         Functional mobility during ADLs: Supervision/safety       Vision Baseline Vision/History: 1 Wears glasses Ability to See in Adequate Light: 0 Adequate Patient Visual Report: No change from baseline Vision Assessment?: No apparent visual deficits     Perception     Praxis      Pertinent Vitals/Pain Pain Assessment Pain Assessment: 0-10 Pain Score: 5  Pain Location: HA Pain Descriptors / Indicators:  Headache Pain Intervention(s): Monitored during session, Limited activity within patient's tolerance, Repositioned     Hand Dominance Right   Extremity/Trunk Assessment Upper Extremity Assessment Upper Extremity Assessment: LUE deficits/detail LUE Deficits / Details: grossly 3/5 MMT, slightly weaker than R UE.  coordination WFL LUE Sensation: decreased light touch LUE Coordination: WNL   Lower Extremity Assessment Lower Extremity Assessment: Defer to PT evaluation RLE Deficits / Details: Hip flexion 4/5, knee extension 5/5, ankle dorsiflexion 4/5 LLE Deficits / Details: Hip flexion 3+/5, knee extension 4/5, ankle dorsiflexion 4/5   Cervical / Trunk Assessment Cervical / Trunk Assessment: Normal   Communication Communication Communication: No difficulties   Cognition Arousal/Alertness: Awake/alert Behavior During Therapy: WFL for tasks assessed/performed Overall Cognitive Status: Impaired/Different from baseline Area of Impairment: Problem solving                             Problem Solving: Slow processing General Comments: pt reports some slow processing.  cognition appears WFL, short blessed test 0/28 (normal)     General Comments       Exercises     Shoulder Instructions      Home Living Family/patient expects to be discharged to:: Private residence Living Arrangements: Children (daughter/grandaughter) Available Help at Discharge: Family Type of Home: Apartment Home Access: Stairs to Probation officer of Steps: flight   Home Layout: One level     Bathroom Shower/Tub: Teacher, early years/pre: Standard     Home Equipment: None          Prior Functioning/Environment Prior Level of Function : Independent/Modified Independent               ADLs Comments: retired, driving        OT Problem List: Decreased strength;Decreased activity tolerance;Impaired balance (sitting and/or standing);Obesity;Impaired sensation      OT Treatment/Interventions: Self-care/ADL training;Therapeutic exercise;DME and/or AE instruction;Therapeutic activities;Patient/family education;Balance training    OT Goals(Current goals can be found in the care plan section) Acute Rehab OT Goals Patient Stated Goal: get better OT Goal Formulation:  With patient Time For Goal Achievement: 12/05/22 Potential to Achieve Goals: Good  OT Frequency: Min 2X/week    Co-evaluation              AM-PAC OT "6 Clicks" Daily Activity     Outcome Measure Help from another person eating meals?: None Help from another person taking care of personal grooming?: A Little Help from another person toileting, which includes using toliet, bedpan, or urinal?: A Little Help from another person bathing (including washing, rinsing, drying)?: A Little Help from another person to put on and taking off regular upper body clothing?: A Little Help from another person to put on and taking off regular lower body clothing?: A Little 6 Click Score: 19   End of Session Nurse Communication: Mobility status  Activity Tolerance: Patient tolerated treatment well (but very fatigued) Patient left: in bed;with call bell/phone within reach;with family/visitor present  OT Visit Diagnosis: Other abnormalities of gait and mobility (R26.89);Muscle weakness (generalized) (M62.81)                Time: [id number] OT Time Calculation (min): 24 min Charges:  OT General Charges $OT Visit: 1 Visit OT Evaluation $OT Eval Moderate Complexity: 1 Mod  Jolaine Artist, OT Acute Rehabilitation Services Office [place] 11/21/2022, 11:12 AM

## 2022-11-21 NOTE — Hospital Course (Signed)
62 y.o. female with medical history significant of recurrent DVT/PT and HTN presenting with L-sided weakness and blurry vision.  Also associated with some nausea and headache.

## 2022-11-22 ENCOUNTER — Other Ambulatory Visit (HOSPITAL_COMMUNITY): Payer: Self-pay

## 2022-11-22 ENCOUNTER — Inpatient Hospital Stay (HOSPITAL_COMMUNITY): Payer: 59

## 2022-11-22 DIAGNOSIS — M5412 Radiculopathy, cervical region: Secondary | ICD-10-CM

## 2022-11-22 DIAGNOSIS — R2990 Unspecified symptoms and signs involving the nervous system: Secondary | ICD-10-CM | POA: Diagnosis not present

## 2022-11-22 DIAGNOSIS — R29898 Other symptoms and signs involving the musculoskeletal system: Secondary | ICD-10-CM | POA: Diagnosis not present

## 2022-11-22 LAB — CBC
HCT: 37.8 % (ref 36.0–46.0)
Hemoglobin: 12.6 g/dL (ref 12.0–15.0)
MCH: 29.5 pg (ref 26.0–34.0)
MCHC: 33.3 g/dL (ref 30.0–36.0)
MCV: 88.5 fL (ref 80.0–100.0)
Platelets: 247 10*3/uL (ref 150–400)
RBC: 4.27 MIL/uL (ref 3.87–5.11)
RDW: 13.2 % (ref 11.5–15.5)
WBC: 4.2 10*3/uL (ref 4.0–10.5)
nRBC: 0 % (ref 0.0–0.2)

## 2022-11-22 LAB — COMPREHENSIVE METABOLIC PANEL
ALT: 33 U/L (ref 0–44)
AST: 23 U/L (ref 15–41)
Albumin: 3.7 g/dL (ref 3.5–5.0)
Alkaline Phosphatase: 60 U/L (ref 38–126)
Anion gap: 8 (ref 5–15)
BUN: 8 mg/dL (ref 8–23)
CO2: 25 mmol/L (ref 22–32)
Calcium: 9.4 mg/dL (ref 8.9–10.3)
Chloride: 107 mmol/L (ref 98–111)
Creatinine, Ser: 0.87 mg/dL (ref 0.44–1.00)
GFR, Estimated: >60 mL/min (ref >60)
Glucose, Bld: 80 mg/dL (ref 70–99)
Potassium: 3.8 mmol/L (ref 3.5–5.1)
Sodium: 140 mmol/L (ref 135–145)
Total Bilirubin: 0.6 mg/dL (ref 0.3–1.2)
Total Protein: 7.1 g/dL (ref 6.5–8.1)

## 2022-11-22 LAB — GLUCOSE, CAPILLARY
Glucose-Capillary: 76 mg/dL (ref 70–99)
Glucose-Capillary: 81 mg/dL (ref 70–99)

## 2022-11-22 MED ORDER — LORAZEPAM 2 MG/ML IJ SOLN
0.5000 mg | Freq: Once | INTRAMUSCULAR | Status: AC | PRN
  Administered 2022-11-22: 0.5 mg via INTRAVENOUS
  Filled 2022-11-22: qty 1

## 2022-11-22 MED ORDER — ASPIRIN 81 MG PO TBEC
81.0000 mg | DELAYED_RELEASE_TABLET | Freq: Every day | ORAL | 0 refills | Status: AC
  Filled 2022-11-22: qty 30, 30d supply, fill #0

## 2022-11-22 NOTE — Progress Notes (Signed)
Brief Nutrition Note  New consult received for pt admitted with stroke like-symptoms.  Met with pt at bedside to inquire about nutrition needs. Pt is not having any difficulty swallowing or chewing. States that she eats 3 meals a day at home. On further discussion, meals are quite small (smoothie made of fruit, water, and peanut butter 2x/d and a meal of either chicken or fish with vegetables at lunch). I expressed concern that pt is not eating enough protein to maintain lean muscle mass. Did encourage adding a source of protein with all meals.   Pt hopeful to be discharged home this afternoon. Will add healthful eating tips to discharge instructions. No acute nutrition needs identified. Will not follow. Please place new RD consult if acute needs arise.  Ranell Patrick, RD, LDN Clinical Dietitian RD pager # available in [place]  After hours/weekend pager # available in [facility]

## 2022-11-22 NOTE — Progress Notes (Signed)
RN had called MRI to let them know that the patient wanted to get the MRI done after lunch and to call me when they were ready since she needs premedication for it. MRI tech stated that would be fine, because they are so busy. She stated she would make a note and call me when they were ready. Not 5 minutes later I get a call that transport is here to take the patient to MRI. Gave patient premedication and let her know they were taking her to MRI.  Patient is very upset now. States that someone took her down for the wrong procedure and then last night they tried to give her insulin that she has never taken before. She states she was wanting MRI done after lunch, not before. States she finds this all "very disconcerting" with all of these "mistakes". Patient stated that she "is ready to go home now".   RN tried to reassure patient, pt states that it was not "you" she was upset with hospital overall.  Rn informed charge nurse of discussion with patient.

## 2022-11-22 NOTE — Progress Notes (Signed)
STROKE TEAM PROGRESS NOTE   SUBJECTIVE (INTERVAL HISTORY) No family is at the bedside.  Overall her condition is unchanged. She is sitting at the edge of bed and eating icecream. She stated that she still felt different at left arm and leg but she can go to bathroom in room no problem, MRI C-spine done showed C5-7 nerve impingement but no spinal cord compression. Will refer to EMG/NCS as outpt.   OBJECTIVE Temp:  [97.5 F (36.4 C)-98.3 F (36.8 C)] 98 F (36.7 C) (04/04 0742) Pulse Rate:  [62-73] 69 (04/04 1136) Cardiac Rhythm: Normal sinus rhythm (04/04 0733) Resp:  [12-20] 18 (04/04 1136) BP: (126-149)/(68-104) 128/68 (04/04 1136) SpO2:  [95 %-100 %] 99 % (04/04 1136)  Recent Labs  Lab 11/21/22 1201 11/21/22 1637 11/21/22 2119 11/22/22 0611 11/22/22 1131  GLUCAP 104* 123* 125* 76 81   Recent Labs  Lab 11/19/22 2007 11/22/22 1002  NA 137 140  K 3.9 3.8  CL 106 107  CO2 22 25  GLUCOSE 89 80  BUN 17 8  CREATININE 0.96 0.87  CALCIUM 9.0 9.4   Recent Labs  Lab 11/19/22 2007 11/22/22 1002  AST 31 23  ALT 37 33  ALKPHOS 59 60  BILITOT 0.5 0.6  PROT 7.3 7.1  ALBUMIN 4.0 3.7   Recent Labs  Lab 11/19/22 2007 11/22/22 1002  WBC 6.1 4.2  NEUTROABS 2.2  --   HGB 11.5* 12.6  HCT 34.8* 37.8  MCV 88.1 88.5  PLT 144* 247   No results for input(s): "CKTOTAL", "CKMB", "CKMBINDEX", "TROPONINI" in the last 168 hours. Recent Labs    11/19/22 2007  LABPROT 13.0  INR 1.0   Recent Labs    11/19/22 2210  COLORURINE YELLOW  LABSPEC 1.020  PHURINE 6.5  GLUCOSEU NEGATIVE  HGBUR TRACE*  BILIRUBINUR NEGATIVE  KETONESUR NEGATIVE  PROTEINUR NEGATIVE  NITRITE NEGATIVE  LEUKOCYTESUR NEGATIVE       Component Value Date/Time   CHOL 152 11/20/2022 1448   CHOL 155 08/31/2021 1443   TRIG 46 11/20/2022 1448   HDL 51 11/20/2022 1448   HDL 55 08/31/2021 1443   CHOLHDL 3.0 11/20/2022 1448   VLDL 9 11/20/2022 1448   LDLCALC 92 11/20/2022 1448   LDLCALC 84 08/31/2021  1443   Lab Results  Component Value Date   HGBA1C 5.7 (H) 09/11/2022      Component Value Date/Time   LABOPIA NONE DETECTED 11/19/2022 2210   COCAINSCRNUR NONE DETECTED 11/19/2022 2210   LABBENZ NONE DETECTED 11/19/2022 2210   AMPHETMU NONE DETECTED 11/19/2022 2210   THCU NONE DETECTED 11/19/2022 2210   LABBARB NONE DETECTED 11/19/2022 2210    Recent Labs  Lab 11/19/22 2007  ETH <10    I have personally reviewed the radiological images below and agree with the radiology interpretations.  MR CERVICAL SPINE WO CONTRAST  Result Date: 11/22/2022 CLINICAL DATA:  Cervical radiculopathy.  No unilaterally mentioned. EXAM: MRI CERVICAL SPINE WITHOUT CONTRAST TECHNIQUE: Multiplanar, multisequence MR imaging of the cervical spine was performed. No intravenous contrast was administered. COMPARISON:  None Available. FINDINGS: Alignment: Normal alignment of the cervical vertebral bodies. Mild straightening of the normal cervical lordosis. Vertebrae: Endplate reactive changes at C4-5 but no bone lesions or fractures are identified. Cord: Normal cord signal intensity.  No cord lesions or syrinx. Posterior Fossa, vertebral arteries, paraspinal tissues: No significant findings. Disc levels: C2-3: No significant findings. C3-4: Degenerative disc disease with disc space narrowing, bulging annulus, osteophytic ridging and uncinate spurring. There is  flattening of the ventral thecal sac and mild narrowing of the ventral CSF space but no significant spinal stenosis. There is moderate bilateral foraminal stenosis, left greater than right. C4-5: Bulging degenerated annulus, osteophytic ridging and uncinate spurring with flattening of the ventral thecal sac and narrowing the ventral CSF space. Moderate bilateral foraminal stenosis. C5-6: No significant spinal stenosis. There is a left-sided disc osteophyte complex with significant medial foraminal stenosis likely effecting the left C6 nerve. No right-sided foraminal  stenosis. C6-7: Broad-based left foraminal disc osteophyte complex with left foraminal stenosis likely effecting the left C7 nerve root. No significant spinal or right foraminal stenosis. C7-T1: No significant findings. IMPRESSION: 1. Degenerative cervical spondylosis with multilevel disc disease and facet disease. 2. Moderate bilateral foraminal stenosis at C3-4, left greater than right. 3. Moderate bilateral foraminal stenosis at C4-5. 4. Left-sided disc osteophyte complex at C5-6 with significant medial foraminal stenosis likely effecting the left C6 nerve root. 5. Broad-based left foraminal disc osteophyte complex at C6-7 with left foraminal stenosis likely effecting the left C7 nerve root. Electronically Signed   By: Marijo Sanes M.D.   On: 11/22/2022 12:52   MR BRAIN WO CONTRAST  Result Date: 11/20/2022 CLINICAL DATA:  Stroke, follow-up. EXAM: MRI HEAD WITHOUT CONTRAST TECHNIQUE: Multiplanar, multiecho pulse sequences of the brain and surrounding structures were obtained without intravenous contrast. COMPARISON:  Head CT and CTA head/neck 11/19/2022. FINDINGS: Brain: No acute infarct or hemorrhage. Mild chronic small-vessel disease. No foci of abnormal susceptibility. No hydrocephalus or extra-axial collection. No mass or midline shift. Vascular: Normal flow voids. Skull and upper cervical spine: Normal marrow signal. Sinuses/Orbits: Unremarkable. Other: None. IMPRESSION: Normal brain MRI for age. Electronically Signed   By: Emmit Alexanders M.D.   On: 11/20/2022 15:53   CT ANGIO HEAD NECK W WO CM  Result Date: 11/19/2022 CLINICAL DATA:  Sudden onset headache last night, left leg weakness and blurred vision EXAM: CT ANGIOGRAPHY HEAD AND NECK TECHNIQUE: Multidetector CT imaging of the head and neck was performed using the standard protocol during bolus administration of intravenous contrast. Multiplanar CT image reconstructions and MIPs were obtained to evaluate the vascular anatomy. Carotid stenosis  measurements (when applicable) are obtained utilizing NASCET criteria, using the distal internal carotid diameter as the denominator. RADIATION DOSE REDUCTION: This exam was performed according to the departmental dose-optimization program which includes automated exposure control, adjustment of the mA and/or kV according to patient size and/or use of iterative reconstruction technique. CONTRAST:  16mL OMNIPAQUE IOHEXOL 350 MG/ML SOLN COMPARISON:  No prior CTA available, correlation is made with 11/19/2022 CT head and 04/06/2017 MRA head and neck FINDINGS: CT HEAD FINDINGS For noncontrast findings, please see same day CT head. CTA NECK FINDINGS Aortic arch: Standard branching. Imaged portion shows no evidence of aneurysm or dissection. No significant stenosis of the major arch vessel origins. Right carotid system: No evidence of dissection, occlusion, or hemodynamically significant stenosis (greater than 50%). Left carotid system: No evidence of dissection, occlusion, or hemodynamically significant stenosis (greater than 50%). Vertebral arteries: No evidence of dissection, occlusion, or hemodynamically significant stenosis (greater than 50%). Skeleton: No acute osseous abnormality. Degenerative changes in the cervical spine. Other neck: Negative. Upper chest: No focal pulmonary opacity or pleural effusion. Review of the MIP images confirms the above findings CTA HEAD FINDINGS Anterior circulation: Both internal carotid arteries are patent to the termini, without significant stenosis. A1 segments patent. Normal anterior communicating artery. Anterior cerebral arteries are patent to their distal aspects without significant stenosis. No  M1 stenosis or occlusion. MCA branches perfused through the M2 and M3 segments, although more distal branch opacification is somewhat limited by bolus timing. Posterior circulation: Vertebral arteries patent to the vertebrobasilar junction without significant stenosis. Posterior  inferior cerebellar arteries patent proximally. Basilar patent to its distal aspect without significant stenosis. Superior cerebellar arteries patent proximally. Patent, diminutive P1 segments. Near fetal origin of the bilateral PCAs from the posterior communicating arteries. PCAs perfused to their distal aspects without significant stenosis. Venous sinuses: Patent. Anatomic variants: Near fetal origin of the bilateral PCAs. Review of the MIP images confirms the above findings IMPRESSION: 1. No intracranial large vessel occlusion or significant stenosis. 2. No hemodynamically significant stenosis in the neck. Electronically Signed   By: Merilyn Baba M.D.   On: 11/19/2022 21:35   CT HEAD WO CONTRAST  Result Date: 11/19/2022 CLINICAL DATA:  Neuro deficit, acute, stroke suspected EXAM: CT HEAD WITHOUT CONTRAST TECHNIQUE: Contiguous axial images were obtained from the base of the skull through the vertex without intravenous contrast. RADIATION DOSE REDUCTION: This exam was performed according to the departmental dose-optimization program which includes automated exposure control, adjustment of the mA and/or kV according to patient size and/or use of iterative reconstruction technique. COMPARISON:  MRI 04/20/2022 FINDINGS: Brain: Normal anatomic configuration. No abnormal intra or extra-axial mass lesion or fluid collection. No abnormal mass effect or midline shift. No evidence of acute intracranial hemorrhage or infarct. Ventricular size is normal. Cerebellum unremarkable. Vascular: Unremarkable Skull: Intact Sinuses/Orbits: Paranasal sinuses are clear. Orbits are unremarkable. Other: Mastoid air cells and middle ear cavities are clear. IMPRESSION: 1. No acute intracranial hemorrhage or infarct. Electronically Signed   By: Fidela Salisbury M.D.   On: 11/19/2022 20:48   ECHOCARDIOGRAM COMPLETE  Result Date: 10/25/2022    ECHOCARDIOGRAM REPORT   Patient Name:   FEBBIE FORBESS Date of Exam: 10/25/2022 Medical Rec #:   [id number]         Height:       71.0 in Accession #:    [id number]        Weight:       237.4 lb Date of Birth:  03-Mar-1961         BSA:          2.268 m Patient Age:    11 years          BP:           125/87 mmHg Patient Gender: F                 HR:           69 bpm. Exam Location:  Outpatient Procedure: 2D Echo, Cardiac Doppler and Color Doppler Indications:    Dyspnea R06.00  History:        Patient has no prior history of Echocardiogram examinations.                 Signs/Symptoms:Dyspnea; Risk Factors:Hypertension.  Sonographer:    Bernadene Person RDCS Referring Phys: [id number] Joyce  1. Left ventricular ejection fraction, by estimation, is 50 to 55%. The left ventricle has low normal function. The left ventricle has no regional wall motion abnormalities. Left ventricular diastolic parameters were normal.  2. Right ventricular systolic function is normal. The right ventricular size is normal.  3. The mitral valve is grossly normal. Trivial mitral valve regurgitation. No evidence of mitral stenosis.  4. The aortic valve is grossly normal. There is mild calcification of the aortic valve.  Aortic valve regurgitation is not visualized. Aortic valve sclerosis is present, with no evidence of aortic valve stenosis.  5. The inferior vena cava is normal in size with greater than 50% respiratory variability, suggesting right atrial pressure of 3 mmHg. Comparison(s): No prior Echocardiogram. Conclusion(s)/Recommendation(s): Otherwise normal echocardiogram, with minor abnormalities described in the report. FINDINGS  Left Ventricle: Left ventricular ejection fraction, by estimation, is 50 to 55%. The left ventricle has low normal function. The left ventricle has no regional wall motion abnormalities. The left ventricular internal cavity size was normal in size. There is no left ventricular hypertrophy. Left ventricular diastolic parameters were normal. Right Ventricle: The right ventricular size is normal.  Right vetricular wall thickness was not well visualized. Right ventricular systolic function is normal. Left Atrium: Left atrial size was normal in size. Right Atrium: Right atrial size was normal in size. Pericardium: There is no evidence of pericardial effusion. Mitral Valve: The mitral valve is grossly normal. Trivial mitral valve regurgitation. No evidence of mitral valve stenosis. Tricuspid Valve: The tricuspid valve is grossly normal. Tricuspid valve regurgitation is trivial. No evidence of tricuspid stenosis. Aortic Valve: The aortic valve is grossly normal. There is mild calcification of the aortic valve. Aortic valve regurgitation is not visualized. Aortic valve sclerosis is present, with no evidence of aortic valve stenosis. Pulmonic Valve: The pulmonic valve was not well visualized. Pulmonic valve regurgitation is not visualized. No evidence of pulmonic stenosis. Aorta: The aortic root, ascending aorta and aortic arch are all structurally normal, with no evidence of dilitation or obstruction. Venous: The inferior vena cava is normal in size with greater than 50% respiratory variability, suggesting right atrial pressure of 3 mmHg. IAS/Shunts: The atrial septum is grossly normal.  LEFT VENTRICLE PLAX 2D LVIDd:         5.70 cm      Diastology LVIDs:         3.60 cm      LV e' medial:    8.10 cm/s LV PW:         0.80 cm      LV E/e' medial:  5.0 LV IVS:        0.70 cm      LV e' lateral:   9.15 cm/s LVOT diam:     2.10 cm      LV E/e' lateral: 4.4 LV SV:         70 LV SV Index:   31 LVOT Area:     3.46 cm  LV Volumes (MOD) LV vol d, MOD A2C: 72.9 ml LV vol d, MOD A4C: 103.0 ml LV vol s, MOD A2C: 34.1 ml LV vol s, MOD A4C: 48.4 ml LV SV MOD A2C:     38.8 ml LV SV MOD A4C:     103.0 ml LV SV MOD BP:      47.1 ml RIGHT VENTRICLE RV S prime:     12.70 cm/s TAPSE (M-mode): 2.2 cm LEFT ATRIUM             Index        RIGHT ATRIUM           Index LA diam:        3.20 cm 1.41 cm/m   RA Area:     13.00 cm LA Vol  (A2C):   36.6 ml 16.14 ml/m  RA Volume:   28.50 ml  12.57 ml/m LA Vol (A4C):   36.1 ml 15.92 ml/m LA Biplane Vol: 36.4 ml 16.05  ml/m  AORTIC VALVE LVOT Vmax:   105.00 cm/s LVOT Vmean:  66.000 cm/s LVOT VTI:    0.201 m  AORTA Ao Root diam: 3.20 cm Ao Asc diam:  3.60 cm MITRAL VALVE MV Area (PHT): 3.99 cm    SHUNTS MV Decel Time: 190 msec    Systemic VTI:  0.20 m MV E velocity: 40.40 cm/s  Systemic Diam: 2.10 cm MV A velocity: 52.60 cm/s MV E/A ratio:  0.77 Buford Dresser MD Electronically signed by Buford Dresser MD Signature Date/Time: 10/25/2022/4:19:31 PM    Final    VAS [place] LOWER EXTREMITY VENOUS (DVT)  Result Date: 10/25/2022  Lower Venous DVT Study Patient Name:  ZULIANA DUPONT  Date of Exam:   10/25/2022 Medical Rec #: [id number]          Accession #:    [id number] Date of Birth: 03-Apr-1961          Patient Gender: F Patient Age:   89 years Exam Location:  [facility] Procedure:      VAS [place] LOWER EXTREMITY VENOUS (DVT) Referring Phys: GRACE LAU --------------------------------------------------------------------------------  Indications: Pain, and chronic cramping and spasms in feet, SOB for 18 years.  Risk Factors: Hx in [id number], and 2004 DVT hx in 1989 and 2004. Comparison Study: No prior study. Performing Technologist: McKayla Maag RVT, [place]  Examination Guidelines: A complete evaluation includes B-mode imaging, spectral Doppler, color Doppler, and power Doppler as needed of all accessible portions of each vessel. Bilateral testing is considered an integral part of a complete examination. Limited examinations for reoccurring indications may be performed as noted. The reflux portion of the exam is performed with the patient in reverse Trendelenburg.  +---------+---------------+---------+-----------+----------+--------------+ RIGHT    CompressibilityPhasicitySpontaneityPropertiesThrombus Aging +---------+---------------+---------+-----------+----------+--------------+ CFV       Full           Yes      Yes                                 +---------+---------------+---------+-----------+----------+--------------+ SFJ      Full                                                        +---------+---------------+---------+-----------+----------+--------------+ FV Prox  Full                                                        +---------+---------------+---------+-----------+----------+--------------+ FV Mid   Full                                                        +---------+---------------+---------+-----------+----------+--------------+ FV DistalFull                                                        +---------+---------------+---------+-----------+----------+--------------+ PFV  Full                                                        +---------+---------------+---------+-----------+----------+--------------+ POP      Full           Yes      Yes                                 +---------+---------------+---------+-----------+----------+--------------+ PTV      Full                                                        +---------+---------------+---------+-----------+----------+--------------+ PERO     Full                                                        +---------+---------------+---------+-----------+----------+--------------+   +---------+---------------+---------+-----------+----------+--------------+ LEFT     CompressibilityPhasicitySpontaneityPropertiesThrombus Aging +---------+---------------+---------+-----------+----------+--------------+ CFV      Full           Yes      Yes                                 +---------+---------------+---------+-----------+----------+--------------+ SFJ      Full                                                        +---------+---------------+---------+-----------+----------+--------------+ FV Prox  Full                                                         +---------+---------------+---------+-----------+----------+--------------+ FV Mid   Full                                                        +---------+---------------+---------+-----------+----------+--------------+ FV DistalFull                                                        +---------+---------------+---------+-----------+----------+--------------+ PFV      Full                                                        +---------+---------------+---------+-----------+----------+--------------+  POP      Full           Yes      Yes                                 +---------+---------------+---------+-----------+----------+--------------+ PTV      Full                                                        +---------+---------------+---------+-----------+----------+--------------+ PERO     Full                                                        +---------+---------------+---------+-----------+----------+--------------+     Summary: BILATERAL: - No evidence of deep vein thrombosis seen in the lower extremities, bilaterally. - No evidence of superficial venous thrombosis in the lower extremities, bilaterally. -No evidence of popliteal cyst, bilaterally.   *See table(s) above for measurements and observations. Electronically signed by Orlie Pollen on 10/25/2022 at 4:18:40 PM.    Final      PHYSICAL EXAM  Temp:  [97.5 F (36.4 C)-98.3 F (36.8 C)] 98 F (36.7 C) (04/04 0742) Pulse Rate:  [62-73] 69 (04/04 1136) Resp:  [12-20] 18 (04/04 1136) BP: (126-149)/(68-104) 128/68 (04/04 1136) SpO2:  [95 %-100 %] 99 % (04/04 1136)  General - Well nourished, well developed, in no apparent distress.   Cardiovascular - Regular rate and rhythm.   Mental Status -  Level of arousal and orientation to time, place, and person were intact. Language including expression, naming, repetition, comprehension was assessed and found intact.   Cranial  Nerves II - XII - II - Visual field intact OU with no blurriness reported. III, IV, VI - Extraocular movements intact. V - Facial sensation intact bilaterally but diminished on the left. VII - Facial movement intact bilaterally. VIII - Hearing & vestibular intact bilaterally. X - Palate elevates symmetrically. XI - Chin turning & shoulder shrug intact bilaterally. XII - Tongue protrusion intact.   Motor Strength - The patient's strength was/5 in right upper and lower extremities, 4+/5 in left upper and lower extremities with lack of effort and pronator drift was absent.  Bulk was normal and fasciculations were absent.   Motor Tone - Muscle tone was assessed at the neck and appendages and was normal.   Sensory - Light touch, temperature/pinprick were assessed and were intact but slightly diminished on the left   Coordination - The patient had normal movements in the hands and feet with no ataxia or dysmetria.  Tremor was absent.   Gait and Station - deferred.    ASSESSMENT/PLAN Ms. LETTI BALLOG is a 62 y.o. female with history of DVT, PE and hypertension presenting with acute onset headache, blurry vision and left-sided weakness and numbness. She did not receive IV t-PA due to outside the window.  MRI was negative for acute stroke, raising question of TIA versus complicated migraine.  As patient's left-sided weakness persists and headache persists as well, will treat headache and see if this improves her deficits.  Does state that she gets headaches from time  to time but does not have a documented history of migraines.   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  Therapy recommendations:  Outpatient PT/OT Disposition: Home   Left sided cervical radiculopathy MRI C spine Left-sided disc osteophyte complex at C5-6 with significant medial foraminal stenosis likely effecting the left C6 nerve root. Broad-based left foraminal disc osteophyte complex at C6-7 with left foraminal stenosis likely effecting the left C7 nerve root. May explaining her left arm symptoms but not left leg. Will refer to [place] for EMG/NCS ASAP  Hypertension Stable Long-term BP goal normotensive   Hyperlipidemia Home meds: Rosuvastatin 20 mg daily, resumed in hospital LDL 92, goal < 70 Patient requests not to increase dose at this time, will attempt to lower LDL through diet and exercise, amenable to seeing a dietitian Continue statin same dose at discharge   Other Stroke Risk Factors Former cigarette smoker Obesity, Body mass index is 34.45 kg/m., recommend weight loss, diet and exercise as appropriate  DVT in 1983, [place] in 2004, was treated with Coumadin.  Patient stopped Coumadin in 2007, and does not wish to restart.   Other [facility] day # 1  Neurology will sign off. Please call with questions. Pt will follow up with Dr Krista Blue at [facility] in about 2 weeks. Thanks for the consult.   Rosalin Hawking, MD PhD Stroke Neurology 11/22/2022 2:41 PM    To contact Stroke Continuity provider, please refer to [email or web address] After hours, contact General Neurology

## 2022-11-22 NOTE — Progress Notes (Signed)
Occupational Therapy Treatment Patient Details Name: [name] MRN: [id number] DOB: March 06, 1961 Today's Date: 11/22/2022   History of present illness Pt is a 62 y.o. F who presents 11/19/2022 with L sided weakness and blurry vision. Normal MRI for age. Significant PMH: recurrent DVT/PE, HTN.   OT comments  Pt progressing towards acute OT goals. Pt reporting continued mild L side weakness LLE weaker than LUE. Completed ADL tasks in the room with supervision then navigated hallways and back to room. D/c recommendation remains appropriate.    Recommendations for follow up therapy are one component of a multi-disciplinary discharge planning process, led by the attending physician.  Recommendations may be updated based on patient status, additional functional criteria and insurance authorization.    Assistance Recommended at Discharge Intermittent Supervision/Assistance  Patient can return home with the following  A little help with walking and/or transfers;A little help with bathing/dressing/bathroom;Help with stairs or ramp for entrance;Direct supervision/assist for financial management;Direct supervision/assist for medications management;Assistance with cooking/housework   Equipment Recommendations  Tub/shower seat    Recommendations for Other Services      Precautions / Restrictions Precautions Precautions: None Restrictions Weight Bearing Restrictions: No       Mobility Bed Mobility Overal bed mobility: Modified Independent                  Transfers Overall transfer level: Needs assistance   Transfers: Sit to/from Stand Sit to Stand: Supervision           General transfer comment: pt reporting that she had been using the rw some while at the hospital. Used initially then no AD as it seemed to be in the way more than assisting. Pt mobilizes at supervision level.     Balance Overall balance assessment: Mild deficits observed, not formally tested                                          ADL either performed or assessed with clinical judgement   ADL Overall ADL's : Needs assistance/impaired     Grooming: Supervision/safety;Standing                   Toilet Transfer: Supervision/safety;Ambulation   Toileting- Clothing Manipulation and Hygiene: Supervision/safety       Functional mobility during ADLs: Supervision/safety General ADL Comments: Pt completed toilet transfer, grooming task standing at sink, household distance mobility and tracking in the hall. Discussed leg weakness and compensatory strategies.    Extremity/Trunk Assessment Upper Extremity Assessment Upper Extremity Assessment: Overall WFL for tasks assessed LUE Deficits / Details: pt reporting some LUE weakness. did not formally assess this session. able to use functionally.   Lower Extremity Assessment Lower Extremity Assessment: Defer to PT evaluation        Vision       Perception     Praxis      Cognition Arousal/Alertness: Awake/alert Behavior During Therapy: WFL for tasks assessed/performed Overall Cognitive Status: Within Functional Limits for tasks assessed                                 General Comments: internally distracted, anxious        Exercises      Shoulder Instructions       General Comments      Pertinent Vitals/ Pain  Pain Assessment Pain Assessment: No/denies pain  Home Living                                          Prior Functioning/Environment              Frequency  Min 2X/week        Progress Toward Goals  OT Goals(current goals can now be found in the care plan section)  Progress towards OT goals: Progressing toward goals  Acute Rehab OT Goals Patient Stated Goal: get better OT Goal Formulation: With patient Time For Goal Achievement: 12/05/22 Potential to Achieve Goals: Good ADL Goals Pt Will Perform Grooming: Independently;standing Pt  Will Perform Lower Body Dressing: Independently;sit to/from stand Pt Will Transfer to Toilet: Independently;ambulating Pt Will Perform Toileting - Clothing Manipulation and hygiene: Independently;sit to/from stand Pt/caregiver will Perform Home Exercise Program: Increased strength;Left upper extremity;With written HEP provided;With theraband Additional ADL Goal #1: Pt will complete 4 step trail making task in distracting enviorment with independence.  Plan Discharge plan remains appropriate    Co-evaluation                 AM-PAC OT "6 Clicks" Daily Activity     Outcome Measure   Help from another person eating meals?: None Help from another person taking care of personal grooming?: A Little Help from another person toileting, which includes using toliet, bedpan, or urinal?: None Help from another person bathing (including washing, rinsing, drying)?: A Little Help from another person to put on and taking off regular upper body clothing?: None Help from another person to put on and taking off regular lower body clothing?: A Little 6 Click Score: 21    End of Session    OT Visit Diagnosis: Other abnormalities of gait and mobility (R26.89);Muscle weakness (generalized) (M62.81)   Activity Tolerance Patient tolerated treatment well   Patient Left in bed;with call bell/phone within reach   Nurse Communication          Time: [id number] OT Time Calculation (min): 17 min  Charges: OT General Charges $OT Visit: 1 Visit OT Treatments $Self Care/Home Management : 8-22 mins  Tyrone Schimke, OT Acute Rehabilitation Services Office: [phone]   Hortencia Pilar 11/22/2022, 12:06 PM

## 2022-11-22 NOTE — Plan of Care (Signed)
Patient discharged to day to home

## 2022-11-22 NOTE — TOC Transition Note (Signed)
Transition of Care [facility]) - CM/SW Discharge Note   Patient Details  Name: [name] MRN: [id number] Date of Birth: 04-Nov-1960  [facility] CM/SW Contact:  Pollie Friar, RN Phone Number: 11/22/2022, 3:18 PM   Clinical Narrative:    Pt is discharging home with resumption of outpatient therapy at [facility]. Information on the AVS.  Pt has transportation home.   Final next level of care: OP Rehab Barriers to Discharge: No Barriers Identified   Patient Goals and CMS Choice   Choice offered to / list presented to : Patient  Discharge Placement                         Discharge Plan and Services Additional resources added to the After Visit Summary for     Discharge Planning Services: CM Consult                                 Social Determinants of Health (SDOH) Interventions SDOH Screenings   Food Insecurity: No Food Insecurity (11/20/2022)  Recent Concern: [place] Present (09/11/2022)  Housing: Low Risk  (11/20/2022)  Transportation Needs: No Transportation Needs (11/20/2022)  Utilities: Not At Risk (11/20/2022)  Alcohol Screen: Low Risk  (01/03/2022)  Depression (PHQ2-9): Low Risk  (09/11/2022)  Financial Resource Strain: High Risk (01/03/2022)  Physical Activity: Sufficiently Active (01/03/2022)  Social Connections: Moderately Isolated (09/11/2022)  Stress: No Stress Concern Present (01/03/2022)  Tobacco Use: Medium Risk (11/20/2022)     Readmission Risk Interventions     No data to display

## 2022-11-22 NOTE — Discharge Summary (Addendum)
Physician Discharge Summary   Patient: [name] MRN: [id number] DOB: 1960-12-30  Admit date:     11/19/2022  Discharge date: 11/22/22  Discharge Physician: Marylu Lund   PCP: Iona Beard, MD   Recommendations at discharge:    Follow up with PCP in 1-2 weeks Follow up with Neurology as scheduled in 2 weeks  Discharge Diagnoses: Principal Problem:   Stroke-like symptoms Active Problems:   Essential hypertension   Dyslipidemia   Diabetes type 2, controlled   Refusal of blood product   Class 1 obesity due to excess calories with body mass index (BMI) of 34.0 to 34.9 in adult   Left leg weakness  Resolved Problems:   * No resolved hospital problems. *  Hospital Course: 62 y.o. female with medical history significant of recurrent DVT/PT and HTN presenting with L-sided weakness and blurry vision.  Also associated with some nausea and headache.    Assessment and Plan: Stroke ruled out -Patient with subacute to acute onset of LLE weakness, blurred vision, headache -MRI brain neg, reviewed -Echo was done on 3/7 and showed EF 50-55% -Neurology consulted. Discussed with Neuro. MRI-C with L sided disc osteophyte complex at C5-6 with significant foraminal stenosis affecting L C6 root, broad based L foraminal disc osteophyte complex at C6-7 with L foraminal stenosis likely affecting C7 nerve root. Neuro recommends referral to [facility] for EMG/NCS ASAP   HTN -Stroke ruled out -Resumed norvasc   HLD -Check FLP -Continue rosuvastatin 20 mg daily   DM -Recent A1c shows good control (A1c 5.7) -Hold home metformin while in hospital -resume home meds on d/c   Blood product refusal -This is documented in chart -She reports that she would only accept blood products from family members -Dr. Lorin Mercy discussed directed donations and the low likelihood that this would happen in an acute situation -Ongoing outpatient discussion encouraged   Obesity -Body mass index is 34.45 kg/m..   -Weight loss should be encouraged -She was previously on Ozempic but stopped this about 3 weeks ago -Outpatient PCP/bariatric medicine/bariatric surgery f/u encouraged      Consultants: Neurology Procedures performed:   Disposition: Home Diet recommendation:  Cardiac diet DISCHARGE MEDICATION: Allergies as of 11/22/2022       Reactions   Dilaudid [hydromorphone Hcl] Nausea Only        Medication List     STOP taking these medications    amLODipine 5 MG tablet Commonly known as: NORVASC   metFORMIN 500 MG tablet Commonly known as: GLUCOPHAGE       TAKE these medications    albuterol 108 (90 Base) MCG/ACT inhaler Commonly known as: VENTOLIN HFA INHALE 1-2 PUFFS BY MOUTH EVERY 6 HOURS AS NEEDED FOR WHEEZE OR SHORTNESS OF BREATH What changed: See the new instructions.   aspirin EC 81 MG tablet Take 1 tablet (81 mg total) by mouth daily. Swallow whole. Start taking on: November 23, 2022   cyclobenzaprine 5 MG tablet Commonly known as: FLEXERIL TAKE 1 TABLET BY MOUTH AT BEDTIME AS NEEDED FOR MUSCLE SPASMS. What changed: See the new instructions.   multivitamin capsule Take 1 capsule daily by mouth.   Ozempic (2 MG/DOSE) 8 MG/3ML Sopn Generic drug: Semaglutide (2 MG/DOSE) Inject 2 mg into the skin once a week.   rosuvastatin 20 MG tablet Commonly known as: CRESTOR TAKE 1 TABLET BY MOUTH EVERY DAY        Follow-up Information     Your Primary Care Provider. Schedule an appointment as soon as possible  for a visit .          [facility] [facility] .   Specialty: Emergency Medicine Why: If symptoms worsen or you cannot see your PCP in the next 2 days Contact information: [address] [id number] [place] [facility] [place]. Go to.   Specialty: Rehabilitation Why: April 10th Contact information: [address] [id number] [place] [phone]        Marcial Pacas, MD. Schedule an appointment as soon as possible for a visit in 2 week(s).   Specialty: Neurology Contact information: [place] [address] [phone]                Discharge Exam: Danley Danker Weights   11/19/22 1961  Weight: 112 kg   General exam: Awake, laying in bed, in nad Respiratory system: Normal respiratory effort, no wheezing Cardiovascular system: regular rate, s1, s2 Gastrointestinal system: Soft, nondistended, positive BS Central nervous system: CN2-12 grossly intact, strength intact Extremities: Perfused, no clubbing Skin: Normal skin turgor, no notable skin lesions seen Psychiatry: Mood normal // no visual hallucinations   Condition at discharge: fair  The results of significant diagnostics from this hospitalization (including imaging, microbiology, ancillary and laboratory) are listed below for reference.   Imaging Studies: MR CERVICAL SPINE WO CONTRAST  Result Date: 11/22/2022 CLINICAL DATA:  Cervical radiculopathy.  No unilaterally mentioned. EXAM: MRI CERVICAL SPINE WITHOUT CONTRAST TECHNIQUE: Multiplanar, multisequence MR imaging of the cervical spine was performed. No intravenous contrast was administered. COMPARISON:  None Available. FINDINGS: Alignment: Normal alignment of the cervical vertebral bodies. Mild straightening of the normal cervical lordosis. Vertebrae: Endplate reactive changes at C4-5 but no bone lesions or fractures are identified. Cord: Normal cord signal intensity.  No cord lesions or syrinx. Posterior Fossa, vertebral arteries, paraspinal tissues: No significant findings. Disc levels: C2-3: No significant findings. C3-4: Degenerative disc disease with disc space narrowing, bulging annulus, osteophytic ridging and uncinate spurring. There is flattening of the ventral thecal sac and mild narrowing of the ventral CSF space but no  significant spinal stenosis. There is moderate bilateral foraminal stenosis, left greater than right. C4-5: Bulging degenerated annulus, osteophytic ridging and uncinate spurring with flattening of the ventral thecal sac and narrowing the ventral CSF space. Moderate bilateral foraminal stenosis. C5-6: No significant spinal stenosis. There is a left-sided disc osteophyte complex with significant medial foraminal stenosis likely effecting the left C6 nerve. No right-sided foraminal stenosis. C6-7: Broad-based left foraminal disc osteophyte complex with left foraminal stenosis likely effecting the left C7 nerve root. No significant spinal or right foraminal stenosis. C7-T1: No significant findings. IMPRESSION: 1. Degenerative cervical spondylosis with multilevel disc disease and facet disease. 2. Moderate bilateral foraminal stenosis at C3-4, left greater than right. 3. Moderate bilateral foraminal stenosis at C4-5. 4. Left-sided disc osteophyte complex at C5-6 with significant medial foraminal stenosis likely effecting the left C6 nerve root. 5. Broad-based left foraminal disc osteophyte complex at C6-7 with left foraminal stenosis likely effecting the left C7 nerve root. Electronically Signed   By: Marijo Sanes M.D.   On: 11/22/2022 12:52   MR BRAIN WO CONTRAST  Result Date: 11/20/2022 CLINICAL DATA:  Stroke, follow-up. EXAM: MRI HEAD WITHOUT CONTRAST TECHNIQUE: Multiplanar, multiecho pulse sequences of the brain and surrounding structures were obtained without intravenous contrast. COMPARISON:  Head CT and CTA head/neck 11/19/2022. FINDINGS: Brain:  No acute infarct or hemorrhage. Mild chronic small-vessel disease. No foci of abnormal susceptibility. No hydrocephalus or extra-axial collection. No mass or midline shift. Vascular: Normal flow voids. Skull and upper cervical spine: Normal marrow signal. Sinuses/Orbits: Unremarkable. Other: None. IMPRESSION: Normal brain MRI for age. Electronically Signed   By:  Emmit Alexanders M.D.   On: 11/20/2022 15:53   CT ANGIO HEAD NECK W WO CM  Result Date: 11/19/2022 CLINICAL DATA:  Sudden onset headache last night, left leg weakness and blurred vision EXAM: CT ANGIOGRAPHY HEAD AND NECK TECHNIQUE: Multidetector CT imaging of the head and neck was performed using the standard protocol during bolus administration of intravenous contrast. Multiplanar CT image reconstructions and MIPs were obtained to evaluate the vascular anatomy. Carotid stenosis measurements (when applicable) are obtained utilizing NASCET criteria, using the distal internal carotid diameter as the denominator. RADIATION DOSE REDUCTION: This exam was performed according to the departmental dose-optimization program which includes automated exposure control, adjustment of the mA and/or kV according to patient size and/or use of iterative reconstruction technique. CONTRAST:  184mL OMNIPAQUE IOHEXOL 350 MG/ML SOLN COMPARISON:  No prior CTA available, correlation is made with 11/19/2022 CT head and 04/06/2017 MRA head and neck FINDINGS: CT HEAD FINDINGS For noncontrast findings, please see same day CT head. CTA NECK FINDINGS Aortic arch: Standard branching. Imaged portion shows no evidence of aneurysm or dissection. No significant stenosis of the major arch vessel origins. Right carotid system: No evidence of dissection, occlusion, or hemodynamically significant stenosis (greater than 50%). Left carotid system: No evidence of dissection, occlusion, or hemodynamically significant stenosis (greater than 50%). Vertebral arteries: No evidence of dissection, occlusion, or hemodynamically significant stenosis (greater than 50%). Skeleton: No acute osseous abnormality. Degenerative changes in the cervical spine. Other neck: Negative. Upper chest: No focal pulmonary opacity or pleural effusion. Review of the MIP images confirms the above findings CTA HEAD FINDINGS Anterior circulation: Both internal carotid arteries are patent  to the termini, without significant stenosis. A1 segments patent. Normal anterior communicating artery. Anterior cerebral arteries are patent to their distal aspects without significant stenosis. No M1 stenosis or occlusion. MCA branches perfused through the M2 and M3 segments, although more distal branch opacification is somewhat limited by bolus timing. Posterior circulation: Vertebral arteries patent to the vertebrobasilar junction without significant stenosis. Posterior inferior cerebellar arteries patent proximally. Basilar patent to its distal aspect without significant stenosis. Superior cerebellar arteries patent proximally. Patent, diminutive P1 segments. Near fetal origin of the bilateral PCAs from the posterior communicating arteries. PCAs perfused to their distal aspects without significant stenosis. Venous sinuses: Patent. Anatomic variants: Near fetal origin of the bilateral PCAs. Review of the MIP images confirms the above findings IMPRESSION: 1. No intracranial large vessel occlusion or significant stenosis. 2. No hemodynamically significant stenosis in the neck. Electronically Signed   By: Merilyn Baba M.D.   On: 11/19/2022 21:35   CT HEAD WO CONTRAST  Result Date: 11/19/2022 CLINICAL DATA:  Neuro deficit, acute, stroke suspected EXAM: CT HEAD WITHOUT CONTRAST TECHNIQUE: Contiguous axial images were obtained from the base of the skull through the vertex without intravenous contrast. RADIATION DOSE REDUCTION: This exam was performed according to the departmental dose-optimization program which includes automated exposure control, adjustment of the mA and/or kV according to patient size and/or use of iterative reconstruction technique. COMPARISON:  MRI 04/20/2022 FINDINGS: Brain: Normal anatomic configuration. No abnormal intra or extra-axial mass lesion or fluid collection. No abnormal mass effect or midline shift. No evidence of acute intracranial  hemorrhage or infarct. Ventricular size is  normal. Cerebellum unremarkable. Vascular: Unremarkable Skull: Intact Sinuses/Orbits: Paranasal sinuses are clear. Orbits are unremarkable. Other: Mastoid air cells and middle ear cavities are clear. IMPRESSION: 1. No acute intracranial hemorrhage or infarct. Electronically Signed   By: Fidela Salisbury M.D.   On: 11/19/2022 20:48   ECHOCARDIOGRAM COMPLETE  Result Date: 10/25/2022    ECHOCARDIOGRAM REPORT   Patient Name:   GREISY DERFLINGER Date of Exam: 10/25/2022 Medical Rec #:  [id number]         Height:       71.0 in Accession #:    [id number]        Weight:       237.4 lb Date of Birth:  Jul 27, 1961         BSA:          2.268 m Patient Age:    67 years          BP:           125/87 mmHg Patient Gender: F                 HR:           69 bpm. Exam Location:  Outpatient Procedure: 2D Echo, Cardiac Doppler and Color Doppler Indications:    Dyspnea R06.00  History:        Patient has no prior history of Echocardiogram examinations.                 Signs/Symptoms:Dyspnea; Risk Factors:Hypertension.  Sonographer:    Bernadene Person RDCS Referring Phys: [id number] [place]  1. Left ventricular ejection fraction, by estimation, is 50 to 55%. The left ventricle has low normal function. The left ventricle has no regional wall motion abnormalities. Left ventricular diastolic parameters were normal.  2. Right ventricular systolic function is normal. The right ventricular size is normal.  3. The mitral valve is grossly normal. Trivial mitral valve regurgitation. No evidence of mitral stenosis.  4. The aortic valve is grossly normal. There is mild calcification of the aortic valve. Aortic valve regurgitation is not visualized. Aortic valve sclerosis is present, with no evidence of aortic valve stenosis.  5. The inferior vena cava is normal in size with greater than 50% respiratory variability, suggesting right atrial pressure of 3 mmHg. Comparison(s): No prior Echocardiogram. Conclusion(s)/Recommendation(s): Otherwise  normal echocardiogram, with minor abnormalities described in the report. FINDINGS  Left Ventricle: Left ventricular ejection fraction, by estimation, is 50 to 55%. The left ventricle has low normal function. The left ventricle has no regional wall motion abnormalities. The left ventricular internal cavity size was normal in size. There is no left ventricular hypertrophy. Left ventricular diastolic parameters were normal. Right Ventricle: The right ventricular size is normal. Right vetricular wall thickness was not well visualized. Right ventricular systolic function is normal. Left Atrium: Left atrial size was normal in size. Right Atrium: Right atrial size was normal in size. Pericardium: There is no evidence of pericardial effusion. Mitral Valve: The mitral valve is grossly normal. Trivial mitral valve regurgitation. No evidence of mitral valve stenosis. Tricuspid Valve: The tricuspid valve is grossly normal. Tricuspid valve regurgitation is trivial. No evidence of tricuspid stenosis. Aortic Valve: The aortic valve is grossly normal. There is mild calcification of the aortic valve. Aortic valve regurgitation is not visualized. Aortic valve sclerosis is present, with no evidence of aortic valve stenosis. Pulmonic Valve: The pulmonic valve was not well visualized. Pulmonic valve regurgitation is not  visualized. No evidence of pulmonic stenosis. Aorta: The aortic root, ascending aorta and aortic arch are all structurally normal, with no evidence of dilitation or obstruction. Venous: The inferior vena cava is normal in size with greater than 50% respiratory variability, suggesting right atrial pressure of 3 mmHg. IAS/Shunts: The atrial septum is grossly normal.  LEFT VENTRICLE PLAX 2D LVIDd:         5.70 cm      Diastology LVIDs:         3.60 cm      LV e' medial:    8.10 cm/s LV PW:         0.80 cm      LV E/e' medial:  5.0 LV IVS:        0.70 cm      LV e' lateral:   9.15 cm/s LVOT diam:     2.10 cm      LV E/e'  lateral: 4.4 LV SV:         70 LV SV Index:   31 LVOT Area:     3.46 cm  LV Volumes (MOD) LV vol d, MOD A2C: 72.9 ml LV vol d, MOD A4C: 103.0 ml LV vol s, MOD A2C: 34.1 ml LV vol s, MOD A4C: 48.4 ml LV SV MOD A2C:     38.8 ml LV SV MOD A4C:     103.0 ml LV SV MOD BP:      47.1 ml RIGHT VENTRICLE RV S prime:     12.70 cm/s TAPSE (M-mode): 2.2 cm LEFT ATRIUM             Index        RIGHT ATRIUM           Index LA diam:        3.20 cm 1.41 cm/m   RA Area:     13.00 cm LA Vol (A2C):   36.6 ml 16.14 ml/m  RA Volume:   28.50 ml  12.57 ml/m LA Vol (A4C):   36.1 ml 15.92 ml/m LA Biplane Vol: 36.4 ml 16.05 ml/m  AORTIC VALVE LVOT Vmax:   105.00 cm/s LVOT Vmean:  66.000 cm/s LVOT VTI:    0.201 m  AORTA Ao Root diam: 3.20 cm Ao Asc diam:  3.60 cm MITRAL VALVE MV Area (PHT): 3.99 cm    SHUNTS MV Decel Time: 190 msec    Systemic VTI:  0.20 m MV E velocity: 40.40 cm/s  Systemic Diam: 2.10 cm MV A velocity: 52.60 cm/s MV E/A ratio:  0.77 Buford Dresser MD Electronically signed by Buford Dresser MD Signature Date/Time: 10/25/2022/4:19:31 PM    Final    VAS [place] LOWER EXTREMITY VENOUS (DVT)  Result Date: 10/25/2022  Lower Venous DVT Study Patient Name:  SATURN HABERLAND  Date of Exam:   10/25/2022 Medical Rec #: [id number]          Accession #:    [id number] Date of Birth: November 07, 1960          Patient Gender: F Patient Age:   50 years Exam Location[facility]  [facility] Procedure:      VAS [place] LOWER EXTREMITY VENOUS (DVT) Referring Phys: GRACE LAU --------------------------------------------------------------------------------  Indications: Pain, and chronic cramping and spasms in feet, SOB for 18 years.  Risk Factors: Hx in [id number], and 2004 DVT hx in 1989 and 2004. Comparison Study: No prior study. Performing Technologist: McKayla Maag RVT, [place]  Examination Guidelines: A complete evaluation includes B-mode imaging, spectral Doppler,  color Doppler, and power Doppler as needed of all accessible portions of each  vessel. Bilateral testing is considered an integral part of a complete examination. Limited examinations for reoccurring indications may be performed as noted. The reflux portion of the exam is performed with the patient in reverse Trendelenburg.  +---------+---------------+---------+-----------+----------+--------------+ RIGHT    CompressibilityPhasicitySpontaneityPropertiesThrombus Aging +---------+---------------+---------+-----------+----------+--------------+ CFV      Full           Yes      Yes                                 +---------+---------------+---------+-----------+----------+--------------+ SFJ      Full                                                        +---------+---------------+---------+-----------+----------+--------------+ FV Prox  Full                                                        +---------+---------------+---------+-----------+----------+--------------+ FV Mid   Full                                                        +---------+---------------+---------+-----------+----------+--------------+ FV DistalFull                                                        +---------+---------------+---------+-----------+----------+--------------+ PFV      Full                                                        +---------+---------------+---------+-----------+----------+--------------+ POP      Full           Yes      Yes                                 +---------+---------------+---------+-----------+----------+--------------+ PTV      Full                                                        +---------+---------------+---------+-----------+----------+--------------+ PERO     Full                                                        +---------+---------------+---------+-----------+----------+--------------+   +---------+---------------+---------+-----------+----------+--------------+  LEFT      CompressibilityPhasicitySpontaneityPropertiesThrombus Aging +---------+---------------+---------+-----------+----------+--------------+ CFV      Full           Yes      Yes                                 +---------+---------------+---------+-----------+----------+--------------+ SFJ      Full                                                        +---------+---------------+---------+-----------+----------+--------------+ FV Prox  Full                                                        +---------+---------------+---------+-----------+----------+--------------+ FV Mid   Full                                                        +---------+---------------+---------+-----------+----------+--------------+ FV DistalFull                                                        +---------+---------------+---------+-----------+----------+--------------+ PFV      Full                                                        +---------+---------------+---------+-----------+----------+--------------+ POP      Full           Yes      Yes                                 +---------+---------------+---------+-----------+----------+--------------+ PTV      Full                                                        +---------+---------------+---------+-----------+----------+--------------+ PERO     Full                                                        +---------+---------------+---------+-----------+----------+--------------+     Summary: BILATERAL: - No evidence of deep vein thrombosis seen in the lower extremities, bilaterally. - No evidence of superficial venous thrombosis in the lower extremities, bilaterally. -No evidence of popliteal cyst, bilaterally.   *See table(s) above for measurements and observations. Electronically  signed by Orlie Pollen on 10/25/2022 at 4:18:40 PM.    Final     Microbiology: Results for orders placed or performed during the  hospital encounter of 08/12/20  Resp Panel by RT-PCR (Flu A&B, Covid) Nasopharyngeal Swab     Status: None   Collection Time: 08/12/20  9:54 AM   Specimen: Nasopharyngeal Swab; Nasopharyngeal(NP) swabs in vial transport medium  Result Value Ref Range Status   SARS Coronavirus 2 by RT PCR NEGATIVE NEGATIVE Final    Comment: (NOTE) SARS-CoV-2 target nucleic acids are NOT DETECTED.  The SARS-CoV-2 RNA is generally detectable in upper respiratory specimens during the acute phase of infection. The lowest concentration of SARS-CoV-2 viral copies this assay can detect is 138 copies/mL. A negative result does not preclude SARS-Cov-2 infection and should not be used as the sole basis for treatment or other patient management decisions. A negative result may occur with  improper specimen collection/handling, submission of specimen other than nasopharyngeal swab, presence of viral mutation(s) within the areas targeted by this assay, and inadequate number of viral copies(<138 copies/mL). A negative result must be combined with clinical observations, patient history, and epidemiological information. The expected result is Negative.  Fact Sheet for Patients:  EntrepreneurPulse.com.au  Fact Sheet for Healthcare Providers:  IncredibleEmployment.be  This test is no t yet approved or cleared by the [place] FDA and  has been authorized for detection and/or diagnosis of SARS-CoV-2 by FDA under an Emergency Use Authorization (EUA). This EUA will remain  in effect (meaning this test can be used) for the duration of the COVID-19 declaration under Section 564(b)(1) of the Act, 21 U.S.C.section 360bbb-3(b)(1), unless the authorization is terminated  or revoked sooner.       Influenza A by PCR NEGATIVE NEGATIVE Final   Influenza B by PCR NEGATIVE NEGATIVE Final    Comment: (NOTE) The Xpert Xpress SARS-CoV-2/FLU/RSV plus assay is intended as an aid in the  diagnosis of influenza from Nasopharyngeal swab specimens and should not be used as a sole basis for treatment. Nasal washings and aspirates are unacceptable for Xpert Xpress SARS-CoV-2/FLU/RSV testing.  Fact Sheet for Patients: EntrepreneurPulse.com.au  Fact Sheet for Healthcare Providers: IncredibleEmployment.be  This test is not yet approved or cleared by the [place] FDA and has been authorized for detection and/or diagnosis of SARS-CoV-2 by FDA under an Emergency Use Authorization (EUA). This EUA will remain in effect (meaning this test can be used) for the duration of the COVID-19 declaration under Section 564(b)(1) of the Act, 21 U.S.C. section 360bbb-3(b)(1), unless the authorization is terminated or revoked.  Performed at [facility], [place] [address], [place], [place] [address]     Labs: CBC: Recent Labs  Lab 11/19/22 2007 11/22/22 1002  WBC 6.1 4.2  NEUTROABS 2.2  --   HGB 11.5* 12.6  HCT 34.8* 37.8  MCV 88.1 88.5  PLT 144* [id number]   Basic Metabolic Panel: Recent Labs  Lab 11/19/22 2007 11/22/22 1002  NA 137 140  K 3.9 3.8  CL 106 107  CO2 22 25  GLUCOSE 89 80  BUN 17 8  CREATININE 0.96 0.87  CALCIUM 9.0 9.4   Liver Function Tests: Recent Labs  Lab 11/19/22 2007 11/22/22 1002  AST 31 23  ALT 37 33  ALKPHOS 59 60  BILITOT 0.5 0.6  PROT 7.3 7.1  ALBUMIN 4.0 3.7   CBG: Recent Labs  Lab 11/21/22 1201 11/21/22 1637 11/21/22 2119 11/22/22 0611 11/22/22 1131  GLUCAP 104* 123* 125*  76 81    Discharge time spent: less than 30 minutes.  Signed: Marylu Lund, MD [facility] 11/22/2022

## 2022-11-23 ENCOUNTER — Telehealth: Payer: Self-pay

## 2022-11-23 NOTE — Transitions of Care (Post Inpatient/ED Visit) (Signed)
   11/23/2022  Name: [name] MRN: [id number] DOB: 1961-07-17  Today's TOC FU Call Status: Today's TOC FU Call Status:: Successful TOC FU Call Competed TOC FU Call Complete Date: 11/23/22  Transition Care Management Follow-up Telephone Call Date of Discharge: 11/22/22 Discharge Facility: [name]) Type of Discharge: Inpatient Admission Primary Inpatient Discharge Diagnosis:: Stroke Like Symptoms How have you been since you were released from the hospital?: Same ("I feel very weak") Any questions or concerns?: Yes Patient Questions/Concerns:: Patient inquired if she should still be taking Plavix since she was taking in the hospital.  Reviewed medications and she is only on ASA Patient Questions/Concerns Addressed: Provided Patient Educational Materials  Items Reviewed: Did you receive and understand the discharge instructions provided?: Yes Medications obtained and verified?: Yes (Medications Reviewed) Any new allergies since your discharge?: No Dietary orders reviewed?: No Do you have support at home?: Yes People in Home: sibling(s) Name of Support/Comfort Primary Source: [facility] and Equipment/Supplies: Were Home Health Services Ordered?: No Any new equipment or medical supplies ordered?: No  Functional Questionnaire: Do you need assistance with bathing/showering or dressing?: No Do you need assistance with meal preparation?: No Do you need assistance with eating?: No Do you have difficulty maintaining continence: No Do you need assistance with getting out of bed/getting out of a chair/moving?: No Do you have difficulty managing or taking your medications?: No  Follow up appointments reviewed: PCP Follow-up appointment confirmed?: No (Message sent to [facility] front desk staff to schedule) Specialist Hospital Follow-up appointment confirmed?: NA Do you need transportation to your follow-up appointment?: No Do you understand care options if your condition(s)  worsen?: Yes-patient verbalized understanding  Patient very concerned that she cannot get her Ozempic prescription as it is not covered by her insurance.  Jodelle Gross, RN, BSN, CCM Care Management Coordinator /[facility] Phone: [phone]/Fax: [phone]

## 2022-11-26 NOTE — Therapy (Signed)
OUTPATIENT PHYSICAL THERAPY EVALUATION   Patient Name: [name] MRN: [id number] DOB:08/30/1960, 62 y.o., female Today's Date: 11/26/2022  END OF SESSION:   Past Medical History:  Diagnosis Date   Anemia 2009    Baseline hemoglobin at 11, secondary to iron deficiency   Arthritis    Breast mass 02/2008    Noted on mammogram March 11, 2008 3 cm simple cyst at 9 to 10:00 position of the right breast as well as multiple other smaller simple cyst, 2.6 cm mass at the 10:00 position of the right breast also representing complex cyst,   Deep vein thrombosis (DVT)     history of multiple  DVTs in 1989, 1989, 2004, on chronic anticoagulation with Coumadin   Diverticulosis    noted colonoscopy 2010   Eczema    Fibroid uterus  2001    status post total abdominal hysterectomy, right and left salpingo-oophorectomy , done by Dr. 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 Clearance Coots   BREAST LUMPECTOMY WITH RADIOACTIVE SEED LOCALIZATION Right 07/09/2017   Procedure: RIGHT BREAST LUMPECTOMY WITH RADIOACTIVE SEED LOCALIZATION;  Surgeon: Harriette Bouillon, MD;  Location: [facility];  Service: General;  Laterality: Right;   CHOLECYSTECTOMY     1997   MASTECTOMY, PARTIAL  03/2006    left partial mastectomy secondary to fibrocystic  disease intraluminal fibroadenoma performed March 28, 2006 done by Dr. Zachery Dakins   Patient Active Problem List   Diagnosis Date Noted   Left leg weakness 11/21/2022   Stroke-like symptoms 11/20/2022   Diabetes type 2, controlled 11/20/2022   Refusal of blood product 11/20/2022   Class 1 obesity due to excess calories with body mass index (BMI) of 34.0 to 34.9 in adult 11/20/2022   Tinnitus of right ear 06/06/2022   Hearing loss due to cerumen impaction, right 06/06/2022   Migraines 05/01/2022   Headache 04/10/2022   Prediabetes 01/05/2022   Hematochezia 01/05/2022   Back pain 09/04/2021   Limb cramps 03/30/2021   Atypical chest pain 02/14/2021   DOE (dyspnea on exertion) 02/04/2020   Shoulder impingement syndrome, right 02/04/2020   Depression 05/16/2018   Neuropathy 01/28/2018   Obesity, morbid 09/28/2016   Dyslipidemia 01/27/2014   Headache(784.0) 09/29/2013   Bilateral knee pain 05/25/2013   Eczema 05/12/2013   Insomnia 05/12/2013   Diverticulosis 04/18/2012   Healthcare maintenance 02/07/2011   Rash 11/13/2007   Upper airway cough syndrome 12/03/2006   Essential hypertension 05/29/2006   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  ONSET DATE: 11/19/22  SUBJECTIVE:  SUBJECTIVE STATEMENT: ***  PERTINENT HISTORY:  HTN, migraines, DM2, multiple PE, depression, DOE, atypical chest pain  PAIN:  Are you having pain? {OPRCPAIN:27236}  PRECAUTIONS: recurrent DVT/PE, ***   WEIGHT BEARING RESTRICTIONS: No  FALLS:  Has patient fallen in last 6 months?  {fallsyesno:27318}  LIVING ENVIRONMENT: Lives with: {[facility] lives with:25569::"lives with their family"} Lives in: {Lives in:25570} Stairs: {opstairs:27293} Has following equipment at home: {Assistive devices:23999}  OCCUPATION: ***  PLOF: {PLOF:24004}  PATIENT GOALS: ***  NEXT MD VISIT: ***  OBJECTIVE:   DIAGNOSTIC FINDINGS:  11/19/22 CTA/CTH IMPRESSION: 1. No intracranial large vessel occlusion or significant stenosis. 2. No hemodynamically significant stenosis in the neck.  11/20/22 MRI brain impression normal per EPIC  11/22/22 MRI C spine IMPRESSION: 1. Degenerative cervical spondylosis with multilevel disc disease and facet disease. 2. Moderate bilateral foraminal stenosis at C3-4, left greater than right. 3. Moderate bilateral foraminal stenosis at C4-5. 4. Left-sided disc osteophyte complex at C5-6 with significant medial foraminal stenosis likely effecting the left C6 nerve root. 5. Broad-based left foraminal disc osteophyte complex at C6-7 with left foraminal stenosis likely effecting the left C7 nerve root.   PATIENT SURVEYS:  {rehab surveys:24030}  COGNITION: Overall cognitive status: {cognition:24006}     SENSATION: {sensation:27233}  MUSCLE LENGTH: Hamstrings: Right *** deg; Left *** deg Maisie Fushomas test: Right *** deg; Left *** deg  POSTURE: {posture:25561}  PALPATION: ***  RANGE OF MOTION:     {AROM/PROM:27142}  Right eval Left eval  Shoulder flexion    Functional ER combo    Functional IR combo    Knee extension    Ankle dorsiflexion     (Blank rows = not tested) (Key: WFL = within functional limits not formally assessed, * = concordant pain, s = stiffness/stretching sensation, NT = not tested)  Comments:    STRENGTH TESTING:  MMT Right eval Left eval  Shoulder flexion    Elbow flexion    Elbow extension    Grip strength (gross)    Hip flexion    Hip abduction (modified sitting)    Knee flexion    Knee extension    Ankle  dorsiflexion    Ankle plantarflexion     (Blank rows = not tested) (Key: WFL = within functional limits not formally assessed, * = concordant pain, s = stiffness/stretching sensation, NT = not tested)  Comments:    FUNCTIONAL TESTS:  5xSTS: ***   GAIT: Distance walked: within clinic Assistive device utilized: {Assistive devices:23999} Level of assistance: {Levels of assistance:24026} Comments: ***  TODAY'S TREATMENT:                                                                                                                              [facility] Adult PT Treatment:  DATE: 11/28/22 Therapeutic Exercise: *** Manual Therapy: *** Neuromuscular re-ed: *** Therapeutic Activity: *** Modalities: *** Self Care: ***   PATIENT EDUCATION:  Education details: Pt education on PT impairments, prognosis, and POC. Informed consent. Rationale for interventions, safe/appropriate HEP performance Person educated: Patient Education method: Explanation, Demonstration, Tactile cues, Verbal cues, and Handouts Education comprehension: verbalized understanding, returned demonstration, verbal cues required, tactile cues required, and needs further education    HOME EXERCISE PROGRAM: ***  ASSESSMENT:  CLINICAL IMPRESSION: Patient is a 62 y.o. woman who was seen today for physical therapy evaluation and treatment for LLE weakness - admitted to [facility] from 11/19/22-11/22/22, received extensive neurological workup. ***    OBJECTIVE IMPAIRMENTS: {opptimpairments:25111}.   ACTIVITY LIMITATIONS: {activitylimitations:27494}  PARTICIPATION LIMITATIONS: {participationrestrictions:25113}  PERSONAL FACTORS: {Personal factors:25162} are also affecting patient's functional outcome.   REHAB POTENTIAL: {rehabpotential:25112}  CLINICAL DECISION MAKING: {clinical decision making:25114}  EVALUATION COMPLEXITY: {Evaluation complexity:25115}   GOALS: Goals  reviewed with patient? {yes/no:20286}  SHORT TERM GOALS: Target date: ***  Pt will demonstrate appropriate understanding and performance of initially prescribed HEP in order to facilitate improved independence with management of symptoms.  Baseline: HEP provided on eval Goal status: INITIAL   2. Pt will score greater than or equal to *** on FOTO in order to demonstrate improved perception of function due to symptoms.  Baseline: ***  Goal status: INITIAL    LONG TERM GOALS: Target date: ***  Pt will score *** or greater on FOTO in order to demonstrate improved perception of function due to symptoms.  Baseline: *** Goal status: INITIAL  2.   Pt will be able to perform 5xSTS in less than or equal to *** in order to demonstrate reduced fall risk and improved functional independence (MCID 5xSTS = 2.3 sec). Baseline: *** Goal status: INITIAL   PLAN:  PT FREQUENCY: {rehab frequency:25116}  PT DURATION: {rehab duration:25117}  PLANNED INTERVENTIONS: {rehab planned interventions:25118::"Therapeutic exercises","Therapeutic activity","Neuromuscular re-education","Balance training","Gait training","Patient/Family education","Self Care","Joint mobilization"}.  PLAN FOR NEXT SESSION: ***   Ashley Murrain, PT 11/26/2022, 9:58 AM

## 2022-11-27 ENCOUNTER — Ambulatory Visit: Payer: Self-pay | Admitting: Neurology

## 2022-11-28 ENCOUNTER — Ambulatory Visit: Payer: 59 | Attending: Internal Medicine | Admitting: Physical Therapy

## 2022-11-28 ENCOUNTER — Other Ambulatory Visit: Payer: Self-pay

## 2022-11-28 ENCOUNTER — Encounter: Payer: Self-pay | Admitting: Physical Therapy

## 2022-11-28 DIAGNOSIS — M25562 Pain in left knee: Secondary | ICD-10-CM | POA: Insufficient documentation

## 2022-11-28 DIAGNOSIS — G8929 Other chronic pain: Secondary | ICD-10-CM | POA: Diagnosis not present

## 2022-11-28 DIAGNOSIS — M6281 Muscle weakness (generalized): Secondary | ICD-10-CM | POA: Diagnosis not present

## 2022-11-28 DIAGNOSIS — R2689 Other abnormalities of gait and mobility: Secondary | ICD-10-CM | POA: Diagnosis not present

## 2022-11-28 DIAGNOSIS — M25561 Pain in right knee: Secondary | ICD-10-CM | POA: Diagnosis not present

## 2022-11-28 DIAGNOSIS — R252 Cramp and spasm: Secondary | ICD-10-CM | POA: Insufficient documentation

## 2022-12-03 ENCOUNTER — Telehealth: Payer: Self-pay

## 2022-12-03 NOTE — Telephone Encounter (Signed)
A Prior Authorization was initiated for this patients OZEMPIC 1MG  DOSE PENS through CoverMyMeds.   Key: [id number]

## 2022-12-04 NOTE — Telephone Encounter (Signed)
Prior Auth for patients medication OZEMPIC  DOSE approved by [employer] from 12/03/22 to 08/20/23.  CoverMyMeds Key: [id number]

## 2022-12-05 DIAGNOSIS — E785 Hyperlipidemia, unspecified: Secondary | ICD-10-CM | POA: Diagnosis not present

## 2022-12-06 ENCOUNTER — Encounter: Payer: Self-pay | Admitting: Internal Medicine

## 2022-12-06 ENCOUNTER — Ambulatory Visit (INDEPENDENT_AMBULATORY_CARE_PROVIDER_SITE_OTHER): Payer: 59 | Admitting: Internal Medicine

## 2022-12-06 ENCOUNTER — Telehealth: Payer: Self-pay | Admitting: *Deleted

## 2022-12-06 ENCOUNTER — Ambulatory Visit: Payer: 59

## 2022-12-06 VITALS — BP 139/76 | HR 74 | Temp 98.4°F | Wt 247.2 lb

## 2022-12-06 DIAGNOSIS — R29898 Other symptoms and signs involving the musculoskeletal system: Secondary | ICD-10-CM | POA: Diagnosis not present

## 2022-12-06 DIAGNOSIS — E785 Hyperlipidemia, unspecified: Secondary | ICD-10-CM | POA: Diagnosis not present

## 2022-12-06 DIAGNOSIS — E782 Mixed hyperlipidemia: Secondary | ICD-10-CM

## 2022-12-06 DIAGNOSIS — F5081 Binge eating disorder: Secondary | ICD-10-CM | POA: Diagnosis not present

## 2022-12-06 DIAGNOSIS — F32A Depression, unspecified: Secondary | ICD-10-CM | POA: Diagnosis not present

## 2022-12-06 MED ORDER — ROSUVASTATIN CALCIUM 40 MG PO TABS
40.0000 mg | ORAL_TABLET | Freq: Every day | ORAL | 0 refills | Status: DC
Start: 2022-12-06 — End: 2022-12-08

## 2022-12-06 NOTE — Telephone Encounter (Signed)
Pt states she was referred to PT at [facility] location Has been to [place] location in the past and would like to go back CMA contacted [place] regarding location change

## 2022-12-06 NOTE — Progress Notes (Unsigned)
CC: hospital follow up  HPI:[name] is a 62 y.o. female who presents for evaluation of hospital follow up/TIA. Please see individual problem based A/P for details.  Patient with history of depression, diabetes, TIA, cervical ostephytes presents for hospital follow up. She is very concerned about her ozempic prescription. States that she felt like a "[place] pig" since there was an issue refilling Rx. She was previously on  but was without it for roughly 1.5 months. She says she has a poor relationship with food and will binge eat. "Feels like a junkie" when at a buffet. Ozempic helped her lose weight and she notes that she has gained roughly 10 pounds in the past 2 months. She is interested in meeting with behavioral health to further discuss this as well.    Depression, PHQ-9: Based on the patients  Flowsheet Row Office Visit from 12/06/2022 in [facility]  PHQ-9 Total Score 11      score we have 11.  Past Medical History:  Diagnosis Date   Anemia 2009    Baseline hemoglobin at 11, secondary to iron deficiency   Arthritis    Breast mass 02/2008    Noted on mammogram March 11, 2008 3 cm simple cyst at 9 to 10:00 position of the right breast as well as multiple other smaller simple cyst, 2.6 cm mass at the 10:00 position of the right breast also representing complex cyst,   Deep vein thrombosis (DVT)     history of multiple  DVTs in 1989, 1989, 2004, on chronic anticoagulation with Coumadin   Diverticulosis    noted colonoscopy 2010   Eczema    Fibroid uterus  2001    status post total abdominal hysterectomy, right and left salpingo-oophorectomy , done by Dr. 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  No murmurs, rubs, or gallops Pulmonary : Equal breath sounds, No wheezes, rales, or rhonchi Abdominal: soft, nontender,  bowel sounds present Ext: No edema in lower extremities, no tenderness to palpation of lower extremities.  Neuro: strength LUE and LLE 4+/5 with subjective decrease in sensation.  Assessment & Plan:   See Encounters Tab for problem based charting.  Stroke-like symptoms >>ASSESSMENT AND PLAN FOR LEFT LEG WEAKNESS WRITTEN ON 12/08/2022  6:00 PM BY Adron Bene, MD  Recent hospitalization for left leg weakness. Initial concern for TIA, but MRI brain was negative. ECHO 50-55%.  She had MRI C spine with L sided disc osteophyte C5-6 with significant foraminal stenosis affecting  L C6 nerve root and L foraminal disc osteophyte C6-7 with stenosis affect C7 root likely as cause of her left sided weakness.  She has been referred to PT, but requesting [place] locations.  She has missed her neurology follow up for EMG. Patient reports she is making improvement with left side slowly. No new complaints. Function appears to be stable. She is using cane today. I have reached out to neurology to get her follow up rescheduled. Her next appointment is not until July at the moment.    Depression Patient reports feeling depressed. PHQ9 is  11. She firmly declines medicine, but does request to meet with behavioral health. I have placed that referral  Dyslipidemia Patient recently hospitalized with concern for TIA. Her most recent LDL 92. Discussed with her that secondary LDL goal less than 75 and that we would need to increase her crestor to . She declines this change. Says she prefers to stay at current dose. I discussed the implications of not achieving control over LDL. She voices understanding and wants to keep current dose   Left leg weakness Recent hospitalization for left leg weakness. Initial concern for TIA, but MRI brain was negative. ECHO 50-55%.  She had MRI C spine with L sided disc osteophyte C5-6 with significant foraminal stenosis affecting L C6 nerve root and L foraminal disc osteophyte C6-7 with stenosis affect C7 root likely as cause of her left sided weakness.  She has been referred to PT, but requesting [place] locations.  She has missed her neurology follow up for EMG. Patient reports she is making improvement with left side slowly. No new complaints. Function appears to be stable. She is using cane today. I have reached out to neurology to get her follow up rescheduled. Her next appointment is not until July at the moment.    Binge eating disorder Patient reports binge eating often and that she has struggled with this and her weight for many years. She describes herself as "a junkie" when at a buffet. She was taking ozempic to help control appetite and lose weight and she is very concerned about getting back on this since Rx lapsed. Her PA has been approved for . She was previously on . I think since she has been off this medicine for over a month, restarting at a lower dose will be best, but I do not think she necessarily needs to start from 0.5mg .  Additionally, she is requesting to speak with therapist regarding this issue. I think this is an excellent idea and will try to get her in to see our  [facility] specialist [name].   Patient discussed with Dr.  Lafonda Mosses

## 2022-12-06 NOTE — Patient Instructions (Addendum)
Dear Mrs. [name],  Thank you for trusting [place] with your care.   We discussed your TIA. Your LDL cholesterol is 92. We would like for it to be below 75. We will increase the Crestor to  daily to reach this goal. I will call the neurologist office to get your nerve conduction study.   For your eating trouble, we will get you set up to see our therapist Marena Chancy. We are restarting the Ozempic at , but will work on increasing you to  as able.   Please return in 1 month for a follow up.

## 2022-12-06 NOTE — Telephone Encounter (Signed)
They will contact pt with appt at [facility] location.Criss Alvine, Briggette Najarian Cassady4/18/202412:15 PM

## 2022-12-08 ENCOUNTER — Encounter: Payer: Self-pay | Admitting: Internal Medicine

## 2022-12-08 DIAGNOSIS — F5081 Binge eating disorder: Secondary | ICD-10-CM | POA: Insufficient documentation

## 2022-12-08 MED ORDER — ROSUVASTATIN CALCIUM 20 MG PO TABS
20.0000 mg | ORAL_TABLET | Freq: Every day | ORAL | 2 refills | Status: DC
Start: 2022-12-08 — End: 2023-01-15

## 2022-12-08 NOTE — Assessment & Plan Note (Addendum)
Recent hospitalization for left leg weakness. Initial concern for TIA, but MRI brain was negative. ECHO 50-55%.  She had MRI C spine with L sided disc osteophyte C5-6 with significant foraminal stenosis affecting L C6 nerve root and L foraminal disc osteophyte C6-7 with stenosis affect C7 root likely as cause of her left sided weakness.  She has been referred to PT, but requesting [place] locations.  She has missed her neurology follow up for EMG. Patient reports she is making improvement with left side slowly. No new complaints. Function appears to be stable. She is using cane today. I have reached out to neurology to get her follow up rescheduled. Her next appointment is not until July at the moment.

## 2022-12-08 NOTE — Assessment & Plan Note (Signed)
Patient recently hospitalized with concern for TIA. Her most recent LDL 92. Discussed with her that secondary LDL goal less than 75 and that we would need to increase her crestor to . She declines this change. Says she prefers to stay at current dose. I discussed the implications of not achieving control over LDL. She voices understanding and wants to keep current dose 

## 2022-12-08 NOTE — Assessment & Plan Note (Signed)
>>  ASSESSMENT AND PLAN FOR LEFT LEG WEAKNESS WRITTEN ON 12/08/2022  6:00 PM BY Adron Bene, MD  Recent hospitalization for left leg weakness. Initial concern for TIA, but MRI brain was negative. ECHO 50-55%.  She had MRI C spine with L sided disc osteophyte C5-6 with significant foraminal stenosis affecting L C6 nerve root and L foraminal disc osteophyte C6-7 with stenosis affect C7 root likely as cause of her left sided weakness.  She has been referred to PT, but requesting [place] locations.  She has missed her neurology follow up for EMG. Patient reports she is making improvement with left side slowly. No new complaints. Function appears to be stable. She is using cane today. I have reached out to neurology to get her follow up rescheduled. Her next appointment is not until July at the moment.

## 2022-12-08 NOTE — Assessment & Plan Note (Signed)
Patient reports binge eating often and that she has struggled with this and her weight for many years. She describes herself as "a junkie" when at a buffet. She was taking ozempic to help control appetite and lose weight and she is very concerned about getting back on this since Rx lapsed. Her PA has been approved for . She was previously on . I think since she has been off this medicine for over a month, restarting at a lower dose will be best, but I do not think she necessarily needs to start from 0.5mg .  Additionally, she is requesting to speak with therapist regarding this issue. I think this is an excellent idea and will try to get her in to see our [facility] specialist [name].

## 2022-12-08 NOTE — Assessment & Plan Note (Signed)
Patient reports feeling depressed. PHQ9 is 11. She firmly declines medicine, but does request to meet with behavioral health. I have placed that referral

## 2022-12-10 NOTE — Progress Notes (Signed)
Internal Medicine Clinic Attending ° °Case discussed with Dr. Gawaluck  At the time of the visit.  We reviewed the resident’s history and exam and pertinent patient test results.  I agree with the assessment, diagnosis, and plan of care documented in the resident’s note.  °

## 2022-12-11 ENCOUNTER — Ambulatory Visit: Payer: 59 | Admitting: Physical Therapy

## 2022-12-12 NOTE — Therapy (Signed)
OUTPATIENT PHYSICAL THERAPY TREATMENT NOTE   Patient Name: [name] MRN: [id number] DOB:25-Feb-1961, 62 y.o., female Today's Date: 12/13/2022  PCP: Quincy Simmonds, MD  REFERRING PROVIDER: Jerald Kief, MD   END OF SESSION:   PT End of Session - 12/13/22 1030     Visit Number 2    Number of Visits 12    Date for PT Re-Evaluation 01/23/23    Authorization Type [employer] MDC/MCD    Authorization Time Period no auth    Progress Note Due on Visit 10    PT Start Time 1030    PT Stop Time 1100    PT Time Calculation (min) 30 min    Equipment Utilized During Treatment Gait belt    Activity Tolerance Patient tolerated treatment well    Behavior During Therapy [facility] for tasks assessed/performed             Past Medical History:  Diagnosis Date   Anemia 2009    Baseline hemoglobin at 11, secondary to iron deficiency   Arthritis    Breast mass 02/2008    Noted on mammogram March 11, 2008 3 cm simple cyst at 9 to 10:00 position of the right breast as well as multiple other smaller simple cyst, 2.6 cm mass at the 10:00 position of the right breast also representing complex cyst,   Deep vein thrombosis (DVT)     history of multiple  DVTs in 1989, 1989, 2004, on chronic anticoagulation with Coumadin   Diverticulosis    noted colonoscopy 2010   Eczema    Fibroid uterus  2001    status post total abdominal hysterectomy, right and left salpingo-oophorectomy , done by Dr. 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 Clearance Coots   BREAST LUMPECTOMY WITH RADIOACTIVE SEED LOCALIZATION Right 07/09/2017   Procedure: RIGHT BREAST LUMPECTOMY WITH RADIOACTIVE SEED LOCALIZATION;  Surgeon: Harriette Bouillon, MD;  Location:  SURGERY CENTER;  Service: General;  Laterality: Right;   CHOLECYSTECTOMY     1997   MASTECTOMY, PARTIAL  03/2006    left partial mastectomy secondary to fibrocystic disease intraluminal fibroadenoma performed March 28, 2006 done by Dr. Zachery Dakins   Patient Active Problem List   Diagnosis Date Noted   Binge eating disorder 12/08/2022   Stroke-like symptoms 11/20/2022   Diabetes type 2, controlled 11/20/2022   Refusal of blood product 11/20/2022   Class 1 obesity due to excess calories with body mass index (BMI) of 34.0 to 34.9 in adult 11/20/2022   Tinnitus of right ear 06/06/2022   Hearing loss due to cerumen impaction, right 06/06/2022   Migraines 05/01/2022   Headache 04/10/2022   Prediabetes 01/05/2022   Hematochezia 01/05/2022   Back pain 09/04/2021   Limb cramps 03/30/2021   Atypical chest pain 02/14/2021  DOE (dyspnea on exertion) 02/04/2020   Shoulder impingement syndrome, right 02/04/2020   Depression 05/16/2018   Neuropathy 01/28/2018   Obesity, morbid 09/28/2016   Dyslipidemia 01/27/2014   Headache(784.0) 09/29/2013   Bilateral knee pain 05/25/2013   Eczema 05/12/2013   Insomnia 05/12/2013   Diverticulosis 04/18/2012   Healthcare maintenance 02/07/2011   Rash 11/13/2007   Upper airway cough syndrome 12/03/2006   Essential hypertension 05/29/2006   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    REFERRING DIAG: R29.898 (ICD-10-CM) - Weakness of left lower extremity   THERAPY DIAG:  Muscle weakness (generalized)  Other abnormalities of gait and mobility  Rationale for Evaluation and  Treatment Rehabilitation  PERTINENT HISTORY: HTN, migraines, DM2, hx DVT/PE, depression   PRECAUTIONS: recurrent DVT/PE   SUBJECTIVE:                                                                                                                                                                                      SUBJECTIVE STATEMENT:  I had to take my granddaughter home from school because she was sick   I wanted to be here today. I dont have pain just weakness.      EVAL-Pt endorses onset of headache, blurred vision, and L hemibody weakness. Presented to [facility] hospital where she was admitted from 11/19/22-11/22/22, per chart received extensive neuro work up as below. Headache and blurred vision since resolved and pt has discharged home, but she notes L hemibody weakness has persisted (LE more affected than UE). Also endorses fatigue and reduced activity tolerance since hospitalization, now using cane. Describes herself as typically active and independent. Pt currently requiring assist with ADLs from family members, endorses significant difficulty with tub transfers and entering her home. Increased difficulty w/ housework. Denies any falls or recurrence of neuro symptoms. Denies any pain or N/T, does endorse some balance deficits.   PAIN:   Pt denies pain    FALLS:  Has patient fallen in last 6 months? No - endorses balance feeling "off" since onset of weakness but no LOB  OBJECTIVE: (objective measures completed at initial evaluation unless otherwise dated)   DIAGNOSTIC FINDINGS:  11/19/22 CTA/CTH IMPRESSION: 1. No intracranial large vessel occlusion or significant stenosis. 2. No hemodynamically significant stenosis in the neck.   11/20/22 MRI brain impression normal per EPIC   11/22/22 MRI C spine IMPRESSION: 1. Degenerative cervical spondylosis with multilevel disc disease and facet disease. 2. Moderate bilateral foraminal stenosis at C3-4, left greater than right. 3. Moderate  bilateral foraminal stenosis at C4-5. 4. Left-sided disc osteophyte complex at C5-6 with significant medial foraminal stenosis likely effecting the left C6 nerve root. 5. Broad-based left foraminal disc  osteophyte complex at C6-7 with left foraminal stenosis likely effecting the left C7 nerve root.     PATIENT SURVEYS:  FOTO 65 current, 6 predicted   COGNITION: Overall cognitive status: Within functional limits for tasks assessed                          SENSATION/NEURO: Light touch intact BIL but diminished L hemibody, no extinction present Coordination testing unremarkable BIL UE  Gait impairments as below but no appreciable ataxia/incoordination of either LE    POSTURE: mild posterior lean, tendency to shift onto RLE in standing     RANGE OF MOTION:      Active  Right eval Left eval  Shoulder flexion      Functional ER combo      Functional IR combo      Knee extension      Ankle dorsiflexion       (Blank rows = not tested) (Key: WFL = within functional limits not formally assessed, * = concordant pain, s = stiffness/stretching sensation, NT = not tested)  Comments:     STRENGTH TESTING:   MMT Right eval Left eval  Shoulder flexion      Elbow flexion      Elbow extension      Grip strength (gross)      Hip flexion 3+ mild back pain 3-  Hip abduction (modified sitting) 5 4+  Knee flexion 4 3+  Knee extension 4- 3+  Ankle dorsiflexion 4 3+  Ankle plantarflexion       (Blank rows = not tested) (Key: WFL = within functional limits not formally assessed, * = concordant pain, s = stiffness/stretching sensation, NT = not tested)  Comments:     FUNCTIONAL TESTS:  Sit to stand: BUE support, weight shift to RLE, inc time/effort   GAIT: Distance walked: within clinic Assistive device utilized: Single point cane Level of assistance: Modified independence Comments: reduced truncal rotation and arm swing, reduced step length on L, inc stance time R. Use of SPC in  RUE. Reduced gait speed and cadence   TODAY'S TREATMENT:     [facility] Adult PT Treatment:                                                DATE: 12-13-22 Therapeutic Exercise: RTB for L knee extension 3 x 10 fatigues on last set RTB for L knee flexion  3 x 10 Sit to stand 3 x 10 with fatigue Bridge with ball  1 x 10 with 1/2 range                                                                                                                 [facility] Adult PT Treatment:  DATE: 11/28/22 Deferred HEP on eval given time constraints   PATIENT EDUCATION:  Education details: Pt education on PT impairments, prognosis, and POC. Informed consent. Rationale for interventions, follow up with PCP, DME options  Person educated: Patient Education method: Explanation, Demonstration, Tactile cues, Verbal cues Education comprehension: verbalized understanding, returned demonstration, verbal cues required, and needs further education     HOME EXERCISE PROGRAM: HEP deferred on eval given time constraints  Access Code: [email or web address] URL: [email or web address] Date: 12/13/2022 Prepared by: Garen Lah  Exercises - Seated Knee Flexion with Anchored Resistance  - 1 x daily - 7 x weekly - 3 sets - 10 reps - Seated Knee Extension with Resistance  - 1 x daily - 7 x weekly - 3 sets - 10 reps - Supine Bridge with Mini Swiss Ball Between Knees  - 1 x daily - 7 x weekly - 3 sets - 10 reps - Sit to stand with sink support Movement snack  - 1 x daily - 7 x weekly - 3 sets - 10 reps ASSESSMENT:   CLINICAL IMPRESSION: Pt returns to clinic without pain and arrives 15 min late. Pt states she was unable to come yesterday at the last minute due to  granddaughter sick at school yesterday.  Pt issued HEP today and returned demo. Pt with significant weakness in proximal R hip and unable to complete full range of bridge.  Pt will benefit from skilled PT to address impairments  and weakness.  Pt with headache at end of session.  And was told to seek medical attention if headache worsens of if she shows increasing signs of weakness.   EVAL- Patient is a 62 y.o. woman who was seen today for physical therapy evaluation and treatment for LLE weakness - admitted to [facility] from 11/19/22-11/22/22, received extensive neurological workup (see EPIC for details). Pt endorses onset of L hemibody weakness along with headache and blurry vision on April 1st, states that headache/vision now normal but weakness has persisted, UE more subtle than LE. Also endorses fatigue and reduced activity tolerance since discharge home, requiring assist from family for ADLs. On exam pt demos mild sensory deficits throughout L hemibody, as well as weakness throughout LLE, endorses subjective feeling of utilizing core muscles to compensate during MMT. No adverse events, pt tolerates session well. Pt inquisitive about return to general exercise - encouraged to follow up with her PCP to receive physician clearance given recent admission. Recommend skilled PT to address relevant deficits to maximize functional tolerance. Pt departs today's session in no acute distress, all voiced questions/concerns addressed appropriately from PT perspective.     OBJECTIVE IMPAIRMENTS: Abnormal gait, decreased activity tolerance, decreased balance, decreased endurance, decreased mobility, difficulty walking, decreased strength, improper body mechanics, and postural dysfunction.    ACTIVITY LIMITATIONS: carrying, lifting, bending, squatting, stairs, transfers, dressing, hygiene/grooming, and locomotion level   PARTICIPATION LIMITATIONS: meal prep, cleaning, laundry, and community activity   PERSONAL FACTORS: Time since onset of injury/illness/exacerbation and 3+ comorbidities: HTN, migraines, DM2, hx DVT/PE, depression  are also affecting patient's functional outcome.    REHAB POTENTIAL: Fair given comorbidities   CLINICAL  DECISION MAKING: Stable/uncomplicated   EVALUATION COMPLEXITY: Low     GOALS: Goals reviewed with patient? No given time constraints   SHORT TERM GOALS: Target date: 12/26/2022 Pt will demonstrate appropriate understanding and performance of initially prescribed HEP in order to facilitate improved independence with management of symptoms.  Baseline: HEP provided on eval Goal status: INITIAL  LONG TERM GOALS: Target date: 01/23/2023 Pt will score 68 or greater on FOTO in order to demonstrate improved perception of function due to symptoms.  Baseline: 65 Goal status: INITIAL   2.   Pt will be improve her 5xSTS score by at least MCID in order to demonstrate reduced fall risk and improved functional independence (MCID 5xSTS = 2.3 sec). Baseline: NT on eval given time constraints/fatigue Goal status: INITIAL    3. Pt will be able to perform lower body dressing without assist in order to foster improved independence w/ basic ADLs.             Baseline: requiring assist from family per pt report            Goal status: INITIAL   4. Pt will be able to safely navigate one flight of stairs with LRAD and RPE <5/10 in order to improve safety/independence with home navigation.            Baseline: reports significant difficulty entering home, "takes 7-8 minutes"            Goal status: INITIAL    5. Pt will be able to safely ambulate at least 769ft with LRAD in order to promote improved community navigation.            Baseline: Requiring SPC (not using prior to hospitalization)            Goal status: INITIAL    PLAN:   PT FREQUENCY: 1-2x/week (2x/week for 4 weeks and then plan to taper to 1x/week)   PT DURATION: 8 weeks   PLANNED INTERVENTIONS: Therapeutic exercises, Therapeutic activity, Neuromuscular re-education, Balance training, Gait training, Patient/Family education, Self Care, Joint mobilization, Vestibular training, DME instructions, Aquatic Therapy, Cryotherapy, Moist heat,  Taping, Manual therapy, and Re-evaluation.   PLAN FOR NEXT SESSION: vs 5xSTS and update goals accordingly, would recommend checking vitals with these given recent hospitalization. Establish HEP, focusing on hip/knee activation.      Garen Lah, PT, ATRIC Certified Exercise Expert for the Aging Adult  12/13/22 11:09 AM Phone: [phone] Fax: [phone]

## 2022-12-13 ENCOUNTER — Ambulatory Visit: Payer: 59 | Admitting: Physical Therapy

## 2022-12-13 ENCOUNTER — Encounter: Payer: Self-pay | Admitting: Physical Therapy

## 2022-12-13 DIAGNOSIS — R252 Cramp and spasm: Secondary | ICD-10-CM | POA: Diagnosis not present

## 2022-12-13 DIAGNOSIS — M25561 Pain in right knee: Secondary | ICD-10-CM | POA: Diagnosis not present

## 2022-12-13 DIAGNOSIS — M6281 Muscle weakness (generalized): Secondary | ICD-10-CM | POA: Diagnosis not present

## 2022-12-13 DIAGNOSIS — R2689 Other abnormalities of gait and mobility: Secondary | ICD-10-CM | POA: Diagnosis not present

## 2022-12-13 DIAGNOSIS — M25562 Pain in left knee: Secondary | ICD-10-CM | POA: Diagnosis not present

## 2022-12-13 DIAGNOSIS — G8929 Other chronic pain: Secondary | ICD-10-CM | POA: Diagnosis not present

## 2022-12-17 ENCOUNTER — Encounter: Payer: Self-pay | Admitting: Physical Therapy

## 2022-12-17 ENCOUNTER — Ambulatory Visit: Payer: 59 | Admitting: Physical Therapy

## 2022-12-17 DIAGNOSIS — M25561 Pain in right knee: Secondary | ICD-10-CM | POA: Diagnosis not present

## 2022-12-17 DIAGNOSIS — R2689 Other abnormalities of gait and mobility: Secondary | ICD-10-CM | POA: Diagnosis not present

## 2022-12-17 DIAGNOSIS — R252 Cramp and spasm: Secondary | ICD-10-CM | POA: Diagnosis not present

## 2022-12-17 DIAGNOSIS — M6281 Muscle weakness (generalized): Secondary | ICD-10-CM | POA: Diagnosis not present

## 2022-12-17 DIAGNOSIS — M25562 Pain in left knee: Secondary | ICD-10-CM | POA: Diagnosis not present

## 2022-12-17 DIAGNOSIS — G8929 Other chronic pain: Secondary | ICD-10-CM | POA: Diagnosis not present

## 2022-12-17 NOTE — Therapy (Signed)
OUTPATIENT PHYSICAL THERAPY TREATMENT NOTE   Patient Name: [name] MRN: [id number] DOB:1960/09/05, 62 y.o., female Today's Date: 12/17/2022  PCP: Quincy Simmonds, MD  REFERRING PROVIDER: Jerald Kief, MD   END OF SESSION:   PT End of Session - 12/17/22 1026     Visit Number 3    Number of Visits 12    Date for PT Re-Evaluation 01/23/23    Authorization Type [employer] MDC/MCD    Authorization Time Period no auth    Progress Note Due on Visit 10    PT Start Time 1028   late check in   PT Stop Time 1059    PT Time Calculation (min) 31 min    Activity Tolerance Patient tolerated treatment well    Behavior During Therapy [facility] for tasks assessed/performed              Past Medical History:  Diagnosis Date   Anemia 2009    Baseline hemoglobin at 11, secondary to iron deficiency   Arthritis    Breast mass 02/2008    Noted on mammogram March 11, 2008 3 cm simple cyst at 9 to 10:00 position of the right breast as well as multiple other smaller simple cyst, 2.6 cm mass at the 10:00 position of the right breast also representing complex cyst,   Deep vein thrombosis (DVT) (HCC)     history of multiple  DVTs in 1989, 1989, 2004, on chronic anticoagulation with Coumadin   Diverticulosis    noted colonoscopy 2010   Eczema    Fibroid uterus  2001    status post total abdominal hysterectomy, right and left salpingo-oophorectomy , done by Dr. 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 Clearance Coots   BREAST LUMPECTOMY WITH RADIOACTIVE SEED LOCALIZATION Right 07/09/2017   Procedure: RIGHT BREAST LUMPECTOMY WITH RADIOACTIVE SEED LOCALIZATION;  Surgeon: Harriette Bouillon, MD;  Location: [facility];  Service: General;  Laterality: Right;   CHOLECYSTECTOMY     1997   MASTECTOMY, PARTIAL  03/2006    left partial mastectomy secondary to fibrocystic disease intraluminal fibroadenoma performed March 28, 2006 done by Dr. Zachery Dakins   Patient Active Problem List   Diagnosis Date Noted   Binge eating disorder 12/08/2022   Stroke-like symptoms 11/20/2022   Diabetes type 2, controlled (HCC) 11/20/2022   Refusal of blood product 11/20/2022   Class 1 obesity due to excess calories with body mass index (BMI) of 34.0 to 34.9 in adult 11/20/2022   Tinnitus of right ear 06/06/2022   Hearing loss due to cerumen impaction, right 06/06/2022   Migraines 05/01/2022   Headache 04/10/2022   Prediabetes 01/05/2022   Hematochezia 01/05/2022   Back pain 09/04/2021   Limb cramps 03/30/2021   Atypical chest pain 02/14/2021  DOE (dyspnea on exertion) 02/04/2020   Shoulder impingement syndrome, right 02/04/2020   Depression 05/16/2018   Neuropathy 01/28/2018   Obesity, morbid (HCC) 09/28/2016   Dyslipidemia 01/27/2014   Headache(784.0) 09/29/2013   Bilateral knee pain 05/25/2013   Eczema 05/12/2013   Insomnia 05/12/2013   Diverticulosis 04/18/2012   Healthcare maintenance 02/07/2011   Rash 11/13/2007   Upper airway cough syndrome 12/03/2006   Essential hypertension 05/29/2006   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    REFERRING DIAG: R29.898 (ICD-10-CM) - Weakness of left lower extremity   THERAPY DIAG:  Muscle weakness (generalized)  Other abnormalities of gait and mobility  Rationale for Evaluation and Treatment  Rehabilitation  PERTINENT HISTORY: HTN, migraines, DM2, hx DVT/PE, depression   PRECAUTIONS: recurrent DVT/PE   SUBJECTIVE:                                                                                                                                                                                      SUBJECTIVE STATEMENT:    12/17/2022 Pt arrives w/o issue, states exercises have been going well at home. States she will be going to stay with her son for a few weeks and is inquisitive re: telehealth/alternative PT options in the interim  EVAL-Pt endorses onset of headache, blurred vision, and L hemibody weakness. Presented to [facility] hospital where she was admitted from 11/19/22-11/22/22, per chart received extensive neuro work up as below. Headache and blurred vision since resolved and pt has discharged home, but she notes L hemibody weakness has persisted (LE more affected than UE). Also endorses fatigue and reduced activity tolerance since hospitalization, now using cane. Describes herself as typically active and independent. Pt currently requiring assist with ADLs from family members, endorses significant difficulty with tub transfers and entering her home. Increased difficulty w/ housework. Denies any falls or recurrence of neuro symptoms. Denies any pain or N/T, does endorse some balance deficits.   PAIN:   Pt denies pain    FALLS:  Has patient fallen in last 6 months? No - endorses balance feeling "off" since onset of weakness but no LOB  OBJECTIVE: (objective measures completed at initial evaluation unless otherwise dated)   DIAGNOSTIC FINDINGS:  11/19/22 CTA/CTH IMPRESSION: 1. No intracranial large vessel occlusion or significant stenosis. 2. No hemodynamically significant stenosis in the neck.   11/20/22 MRI brain impression normal per EPIC   11/22/22 MRI C spine IMPRESSION: 1. Degenerative cervical spondylosis with multilevel disc disease and facet disease. 2. Moderate bilateral  foraminal stenosis at C3-4, left greater than right. 3. Moderate bilateral foraminal stenosis at C4-5. 4. Left-sided disc osteophyte complex at C5-6 with significant medial foraminal stenosis likely effecting the  left C6 nerve root. 5. Broad-based left foraminal disc osteophyte complex at C6-7 with left foraminal stenosis likely effecting the left C7 nerve root.     PATIENT SURVEYS:  FOTO 65 current, 54 predicted   COGNITION: Overall cognitive status: Within functional limits for tasks assessed                          SENSATION/NEURO: Light touch intact BIL but diminished L hemibody, no extinction present Coordination testing unremarkable BIL UE  Gait impairments as below but no appreciable ataxia/incoordination of either LE    POSTURE: mild posterior lean, tendency to shift onto RLE in standing     RANGE OF MOTION:      Active  Right eval Left eval  Shoulder flexion      Functional ER combo      Functional IR combo      Knee extension      Ankle dorsiflexion       (Blank rows = not tested) (Key: WFL = within functional limits not formally assessed, * = concordant pain, s = stiffness/stretching sensation, NT = not tested)  Comments:     STRENGTH TESTING:   MMT Right eval Left eval  Shoulder flexion      Elbow flexion      Elbow extension      Grip strength (gross)      Hip flexion 3+ mild back pain 3-  Hip abduction (modified sitting) 5 4+  Knee flexion 4 3+  Knee extension 4- 3+  Ankle dorsiflexion 4 3+  Ankle plantarflexion       (Blank rows = not tested) (Key: WFL = within functional limits not formally assessed, * = concordant pain, s = stiffness/stretching sensation, NT = not tested)  Comments:     FUNCTIONAL TESTS:  Sit to stand: BUE support, weight shift to RLE, inc time/effort   GAIT: Distance walked: within clinic Assistive device utilized: Single point cane Level of assistance: Modified independence Comments: reduced truncal rotation and arm  swing, reduced step length on L, inc stance time R. Use of SPC in RUE. Reduced gait speed and cadence   TODAY'S TREATMENT:     [facility] Adult PT Treatment:                                                DATE: 12/17/22 Therapeutic Exercise: STS x10 from airex pads x2 with UE support STS 3x5 with tactile/verbal cues as needed  at quads and cues for symmetry of WB Seated>side plank + unilat push up 2x8 to L only  4 inch step LLE only x12  Therapeutic Activity: Education on relevant anatomy/physiology as it pertains to impairments and functional mobility Discussion/education re: DME, PT POC    [facility] Adult PT Treatment:                                                DATE: 12-13-22 Therapeutic Exercise: RTB for L knee extension 3 x 10 fatigues on last set RTB for L knee flexion  3 x 10 Sit to stand 3 x 10 with fatigue Bridge with ball  1 x 10 with 1/2 range  PATIENT EDUCATION:  Education details: rationale for interventions, safety w/ mobility, DME Person educated: Patient Education method: Explanation, Demonstration, Tactile cues, Verbal cues Education comprehension: verbalized understanding, returned demonstration, verbal cues required, and needs further education     HOME EXERCISE PROGRAM: Access Code: [email or web address] URL: [email or web address] Date: 12/13/2022 Prepared by: Garen Lah  Exercises - Seated Knee Flexion with Anchored Resistance  - 1 x daily - 7 x weekly - 3 sets - 10 reps - Seated Knee Extension with Resistance  - 1 x daily - 7 x weekly - 3 sets - 10 reps - Supine Bridge with Mini Swiss Ball Between Knees  - 1 x daily - 7 x weekly - 3 sets - 10 reps - Sit to stand with sink support Movement snack  - 1 x daily - 7 x weekly - 3 sets - 10 reps   ASSESSMENT:   CLINICAL IMPRESSION: 12/17/2022 Pt arrives w/o pain, states exercises going well at home,  still unable to follow up with PCP. No issues after last session but does mention that she will be staying with her son for a few weeks out of town and is inquisitive re: PT POC; time spent with education/discussion re: PT POC options. Today focusing on improved utilization of L hemibody for functional movement patterns as pt demos tendency to inc WB through RLE during STS, improves with verbal cues and tactile cues at quad for inc extension. Also introduced upper body WB exercises to incorporate improved L hemibody strengthening with inc proprioception. No adverse events, pt tolerates session quite well. Recommend continuing along current POC in order to address relevant deficits and improve functional tolerance. Pt departs today's session in no acute distress, all voiced questions/concerns addressed appropriately from PT perspective.    EVAL- Patient is a 62 y.o. woman who was seen today for physical therapy evaluation and treatment for LLE weakness - admitted to [facility] from 11/19/22-11/22/22, received extensive neurological workup (see EPIC for details). Pt endorses onset of L hemibody weakness along with headache and blurry vision on April 1st, states that headache/vision now normal but weakness has persisted, UE more subtle than LE. Also endorses fatigue and reduced activity tolerance since discharge home, requiring assist from family for ADLs. On exam pt demos mild sensory deficits throughout L hemibody, as well as weakness throughout LLE, endorses subjective feeling of utilizing core muscles to compensate during MMT. No adverse events, pt tolerates session well. Pt inquisitive about return to general exercise - encouraged to follow up with her PCP to receive physician clearance given recent admission. Recommend skilled PT to address relevant deficits to maximize functional tolerance. Pt departs today's session in no acute distress, all voiced questions/concerns addressed appropriately from PT perspective.      OBJECTIVE IMPAIRMENTS: Abnormal gait, decreased activity tolerance, decreased balance, decreased endurance, decreased mobility, difficulty walking, decreased strength, improper body mechanics, and postural dysfunction.    ACTIVITY LIMITATIONS: carrying, lifting, bending, squatting, stairs, transfers, dressing, hygiene/grooming, and locomotion level   PARTICIPATION LIMITATIONS: meal prep, cleaning, laundry, and community activity   PERSONAL FACTORS: Time since onset of injury/illness/exacerbation and 3+ comorbidities: HTN, migraines, DM2, hx DVT/PE, depression  are also affecting patient's functional outcome.    REHAB POTENTIAL: Fair given comorbidities   CLINICAL DECISION MAKING: Stable/uncomplicated   EVALUATION COMPLEXITY: Low     GOALS: Goals reviewed with patient? No given time constraints   SHORT TERM GOALS: Target date: 12/26/2022 Pt will demonstrate appropriate understanding and performance of initially prescribed HEP in order to  facilitate improved independence with management of symptoms.  Baseline: HEP provided on eval Goal status: INITIAL      LONG TERM GOALS: Target date: 01/23/2023 Pt will score 68 or greater on FOTO in order to demonstrate improved perception of function due to symptoms.  Baseline: 65 Goal status: INITIAL   2.   Pt will be improve her 5xSTS score by at least MCID in order to demonstrate reduced fall risk and improved functional independence (MCID 5xSTS = 2.3 sec). Baseline: NT on eval given time constraints/fatigue Goal status: INITIAL    3. Pt will be able to perform lower body dressing without assist in order to foster improved independence w/ basic ADLs.             Baseline: requiring assist from family per pt report            Goal status: INITIAL   4. Pt will be able to safely navigate one flight of stairs with LRAD and RPE <5/10 in order to improve safety/independence with home navigation.            Baseline: reports significant difficulty  entering home, "takes 7-8 minutes"            Goal status: INITIAL    5. Pt will be able to safely ambulate at least 791ft with LRAD in order to promote improved community navigation.            Baseline: Requiring SPC (not using prior to hospitalization)            Goal status: INITIAL    PLAN:   PT FREQUENCY: 1-2x/week (2x/week for 4 weeks and then plan to taper to 1x/week)   PT DURATION: 8 weeks   PLANNED INTERVENTIONS: Therapeutic exercises, Therapeutic activity, Neuromuscular re-education, Balance training, Gait training, Patient/Family education, Self Care, Joint mobilization, Vestibular training, DME instructions, Aquatic Therapy, Cryotherapy, Moist heat, Taping, Manual therapy, and Re-evaluation.   PLAN FOR NEXT SESSION: 5xSTS. Review/update HEP PRN. Work on L hemibody strengthening with emphasis on LLE      Ashley Murrain PT, DPT 12/17/2022 12:47 PM

## 2022-12-18 ENCOUNTER — Encounter: Payer: 59 | Admitting: Physical Therapy

## 2022-12-19 ENCOUNTER — Ambulatory Visit: Payer: 59 | Admitting: Physical Therapy

## 2022-12-20 ENCOUNTER — Encounter: Payer: 59 | Admitting: Physical Therapy

## 2022-12-20 ENCOUNTER — Ambulatory Visit: Payer: 59 | Attending: Internal Medicine | Admitting: Physical Therapy

## 2022-12-20 ENCOUNTER — Encounter: Payer: Self-pay | Admitting: Physical Therapy

## 2022-12-20 DIAGNOSIS — M6281 Muscle weakness (generalized): Secondary | ICD-10-CM | POA: Diagnosis not present

## 2022-12-20 DIAGNOSIS — R2689 Other abnormalities of gait and mobility: Secondary | ICD-10-CM | POA: Insufficient documentation

## 2022-12-20 NOTE — Therapy (Signed)
OUTPATIENT PHYSICAL THERAPY TREATMENT NOTE   Patient Name: [name] MRN: [id number] DOB:[birth year], 62 y.o., female Today's Date: 12/20/2022  PCP: Quincy Simmonds, MD  REFERRING PROVIDER: Jerald Kief, MD   END OF SESSION:   PT End of Session - 12/20/22 1451     Visit Number 4    Number of Visits 12    Date for PT Re-Evaluation 01/23/23    Authorization Type [employer] MDC/MCD    Authorization Time Period no auth    PT Start Time 0250              Past Medical History:  Diagnosis Date   Anemia 2009    Baseline hemoglobin at 11, secondary to iron deficiency   Arthritis    Breast mass 02/2008    Noted on mammogram March 11, 2008 3 cm simple cyst at 9 to 10:00 position of the right breast as well as multiple other smaller simple cyst, 2.6 cm mass at the 10:00 position of the right breast also representing complex cyst,   Deep vein thrombosis (DVT) (HCC)     history of multiple  DVTs in 1989, 1989, 2004, on chronic anticoagulation with Coumadin   Diverticulosis    noted colonoscopy 2010   Eczema    Fibroid uterus  2001    status post total abdominal hysterectomy, right and left salpingo-oophorectomy , done by Dr. 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 Clearance Coots   BREAST LUMPECTOMY WITH RADIOACTIVE SEED LOCALIZATION  Right 07/09/2017   Procedure: RIGHT BREAST LUMPECTOMY WITH RADIOACTIVE SEED LOCALIZATION;  Surgeon: Harriette Bouillon, MD;  Location: [facility];  Service: General;  Laterality: Right;   CHOLECYSTECTOMY     1997   MASTECTOMY, PARTIAL  03/2006    left partial mastectomy secondary to fibrocystic disease intraluminal fibroadenoma performed March 28, 2006 done by Dr. Zachery Dakins   Patient Active Problem List   Diagnosis Date Noted   Binge eating disorder 12/08/2022   Stroke-like symptoms 11/20/2022   Diabetes type 2, controlled (HCC) 11/20/2022   Refusal of blood product 11/20/2022   Class 1 obesity due to excess calories with body mass index (BMI) of 34.0 to 34.9 in adult 11/20/2022   Tinnitus of right ear 06/06/2022   Hearing loss due to cerumen impaction, right 06/06/2022   Migraines 05/01/2022   Headache 04/10/2022   Prediabetes 01/05/2022   Hematochezia 01/05/2022   Back pain 09/04/2021   Limb cramps 03/30/2021   Atypical chest pain 02/14/2021   DOE (dyspnea on exertion) 02/04/2020   Shoulder impingement syndrome, right 02/04/2020   Depression 05/16/2018   Neuropathy 01/28/2018   Obesity, morbid (HCC) 09/28/2016   Dyslipidemia 01/27/2014   Headache(784.0) 09/29/2013   Bilateral knee pain 05/25/2013   Eczema 05/12/2013   Insomnia 05/12/2013  Diverticulosis 04/18/2012   Healthcare maintenance 02/07/2011   Rash 11/13/2007   Upper airway cough syndrome 12/03/2006   Essential hypertension 05/29/2006   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    REFERRING DIAG: R29.898 (ICD-10-CM) - Weakness of left lower extremity   THERAPY DIAG:  Muscle weakness (generalized)  Other abnormalities of gait and mobility  Rationale for Evaluation and Treatment Rehabilitation  PERTINENT HISTORY: HTN, migraines, DM2, hx DVT/PE, depression   PRECAUTIONS: recurrent DVT/PE   SUBJECTIVE:                                                                                                                                                                                       SUBJECTIVE STATEMENT:    12/20/2022 Pt arrives w/o issue, states exercises have been going well at home. States she will be going to stay with her son for a few weeks and is inquisitive re: telehealth/alternative PT options in the interim  EVAL-Pt endorses onset of headache, blurred vision, and L hemibody weakness. Presented to [facility] hospital where she was admitted from 11/19/22-11/22/22, per chart received extensive neuro work up as below. Headache and blurred vision since resolved and pt has discharged home, but she notes L hemibody weakness has persisted (LE more affected than UE). Also endorses fatigue and reduced activity tolerance since hospitalization, now using cane. Describes herself as typically active and independent. Pt currently requiring assist with ADLs from family members, endorses significant difficulty with tub transfers and entering her home. Increased difficulty w/ housework. Denies any falls or recurrence of neuro symptoms. Denies any pain or N/T, does endorse some balance deficits.   PAIN:   Pt denies pain    FALLS:  Has patient fallen in last 6 months? No - endorses balance feeling "off" since onset of weakness but no LOB  OBJECTIVE: (objective measures completed at initial evaluation unless otherwise dated)   DIAGNOSTIC FINDINGS:  11/19/22 CTA/CTH IMPRESSION: 1. No intracranial large vessel occlusion or significant stenosis. 2. No hemodynamically significant stenosis in the neck.   11/20/22 MRI brain impression normal per EPIC   11/22/22 MRI C spine IMPRESSION: 1. Degenerative cervical spondylosis with multilevel disc disease and facet disease. 2. Moderate bilateral foraminal stenosis at C3-4, left greater than right. 3. Moderate bilateral foraminal stenosis at C4-5. 4. Left-sided disc osteophyte complex at C5-6 with significant medial foraminal stenosis likely effecting the left C6 nerve  root. 5. Broad-based left foraminal disc osteophyte complex at C6-7 with left foraminal stenosis likely effecting the left C7 nerve root.     PATIENT SURVEYS:  FOTO 65 current, 53 predicted   COGNITION: Overall cognitive status: Within functional limits for tasks assessed  SENSATION/NEURO: Light touch intact BIL but diminished L hemibody, no extinction present Coordination testing unremarkable BIL UE  Gait impairments as below but no appreciable ataxia/incoordination of either LE    POSTURE: mild posterior lean, tendency to shift onto RLE in standing     RANGE OF MOTION:      Active  Right eval Left eval  Shoulder flexion      Functional ER combo      Functional IR combo      Knee extension      Ankle dorsiflexion       (Blank rows = not tested) (Key: WFL = within functional limits not formally assessed, * = concordant pain, s = stiffness/stretching sensation, NT = not tested)  Comments:     STRENGTH TESTING:   MMT Right eval Left eval  Shoulder flexion      Elbow flexion      Elbow extension      Grip strength (gross)      Hip flexion 3+ mild back pain 3-  Hip abduction (modified sitting) 5 4+  Knee flexion 4 3+  Knee extension 4- 3+  Ankle dorsiflexion 4 3+  Ankle plantarflexion       (Blank rows = not tested) (Key: WFL = within functional limits not formally assessed, * = concordant pain, s = stiffness/stretching sensation, NT = not tested)  Comments:     FUNCTIONAL TESTS:  Sit to stand: BUE support, weight shift to RLE, inc time/effort   GAIT: Distance walked: within clinic Assistive device utilized: Single point cane Level of assistance: Modified independence Comments: reduced truncal rotation and arm swing, reduced step length on L, inc stance time R. Use of SPC in RUE. Reduced gait speed and cadence   TODAY'S TREATMENT:     [facility] Adult PT Treatment:                                                DATE: 12/20/22 Therapeutic  Exercise: Nustep L4 UE/LE x 5 minutes Knee ext 5# OMEGA bilat 10 x 2  Knee flex 20# OMEGA bilat 10 x 2  Leg press 40# bilateral 10 x 2  Standing heel raises 10 x 2  Gastroc stretch bilateral  6 inch step up left x 12  Bridge with feet over ball x 12  Left hip flexor stretch x 3 EOM.     [facility] Adult PT Treatment:                                                DATE: 12/17/22 Therapeutic Exercise: STS x10 from airex pads x2 with UE support STS 3x5 with tactile/verbal cues as needed  at quads and cues for symmetry of WB Seated>side plank + unilat push up 2x8 to L only  4 inch step LLE only x12  Therapeutic Activity: Education on relevant anatomy/physiology as it pertains to impairments and functional mobility Discussion/education re: DME, PT POC    [facility] Adult PT Treatment:  DATE: 12-13-22 Therapeutic Exercise: RTB for L knee extension 3 x 10 fatigues on last set RTB for L knee flexion  3 x 10 Sit to stand 3 x 10 with fatigue Bridge with ball  1 x 10 with 1/2 range                                                                                                                 PATIENT EDUCATION:  Education details: rationale for interventions, safety w/ mobility, DME Person educated: Patient Education method: Explanation, Demonstration, Tactile cues, Verbal cues Education comprehension: verbalized understanding, returned demonstration, verbal cues required, and needs further education     HOME EXERCISE PROGRAM: Access Code: [email or web address] URL: [email or web address] Date: 12/13/2022 Prepared by: Garen Lah  Exercises - Seated Knee Flexion with Anchored Resistance  - 1 x daily - 7 x weekly - 3 sets - 10 reps - Seated Knee Extension with Resistance  - 1 x daily - 7 x weekly - 3 sets - 10 reps - Supine Bridge with Mini Swiss Ball Between Knees  - 1 x daily - 7 x weekly - 3 sets - 10 reps - Sit to stand with sink support  Movement snack  - 1 x daily - 7 x weekly - 3 sets - 10 reps   ASSESSMENT:   CLINICAL IMPRESSION: 12/20/2022 Pt arrives w/o pain, states exercises going well at home, still unable to follow up with PCP. No issues after last session. Wants to return to gym. Used gym machines with light weight and she tolerated well. Added stretches for calf and quad. She reported that she got a good work out at end of session.     EVAL- Patient is a 62 y.o. woman who was seen today for physical therapy evaluation and treatment for LLE weakness - admitted to [facility] from 11/19/22-11/22/22, received extensive neurological workup (see EPIC for details). Pt endorses onset of L hemibody weakness along with headache and blurry vision on April 1st, states that headache/vision now normal but weakness has persisted, UE more subtle than LE. Also endorses fatigue and reduced activity tolerance since discharge home, requiring assist from family for ADLs. On exam pt demos mild sensory deficits throughout L hemibody, as well as weakness throughout LLE, endorses subjective feeling of utilizing core muscles to compensate during MMT. No adverse events, pt tolerates session well. Pt inquisitive about return to general exercise - encouraged to follow up with her PCP to receive physician clearance given recent admission. Recommend skilled PT to address relevant deficits to maximize functional tolerance. Pt departs today's session in no acute distress, all voiced questions/concerns addressed appropriately from PT perspective.     OBJECTIVE IMPAIRMENTS: Abnormal gait, decreased activity tolerance, decreased balance, decreased endurance, decreased mobility, difficulty walking, decreased strength, improper body mechanics, and postural dysfunction.    ACTIVITY LIMITATIONS: carrying, lifting, bending, squatting, stairs, transfers, dressing, hygiene/grooming, and locomotion level   PARTICIPATION LIMITATIONS: meal prep, cleaning, laundry, and  community activity   PERSONAL FACTORS: Time since onset of injury/illness/exacerbation and  3+ comorbidities: HTN, migraines, DM2, hx DVT/PE, depression  are also affecting patient's functional outcome.    REHAB POTENTIAL: Fair given comorbidities   CLINICAL DECISION MAKING: Stable/uncomplicated   EVALUATION COMPLEXITY: Low     GOALS: Goals reviewed with patient? No given time constraints   SHORT TERM GOALS: Target date: 12/26/2022 Pt will demonstrate appropriate understanding and performance of initially prescribed HEP in order to facilitate improved independence with management of symptoms.  Baseline: HEP provided on eval Goal status: INITIAL      LONG TERM GOALS: Target date: 01/23/2023 Pt will score 68 or greater on FOTO in order to demonstrate improved perception of function due to symptoms.  Baseline: 65 Goal status: INITIAL   2.   Pt will be improve her 5xSTS score by at least MCID in order to demonstrate reduced fall risk and improved functional independence (MCID 5xSTS = 2.3 sec). Baseline: NT on eval given time constraints/fatigue Goal status: INITIAL    3. Pt will be able to perform lower body dressing without assist in order to foster improved independence w/ basic ADLs.             Baseline: requiring assist from family per pt report            Goal status: INITIAL   4. Pt will be able to safely navigate one flight of stairs with LRAD and RPE <5/10 in order to improve safety/independence with home navigation.            Baseline: reports significant difficulty entering home, "takes 7-8 minutes"            Goal status: INITIAL    5. Pt will be able to safely ambulate at least 755ft with LRAD in order to promote improved community navigation.            Baseline: Requiring SPC (not using prior to hospitalization)            Goal status: INITIAL    PLAN:   PT FREQUENCY: 1-2x/week (2x/week for 4 weeks and then plan to taper to 1x/week)   PT DURATION: 8 weeks    PLANNED INTERVENTIONS: Therapeutic exercises, Therapeutic activity, Neuromuscular re-education, Balance training, Gait training, Patient/Family education, Self Care, Joint mobilization, Vestibular training, DME instructions, Aquatic Therapy, Cryotherapy, Moist heat, Taping, Manual therapy, and Re-evaluation.   PLAN FOR NEXT SESSION: 5xSTS. Review/update HEP PRN. Work on L hemibody strengthening with emphasis on LLE      Jannette Spanner, PTA 12/20/22 3:14 PM Phone: [phone] Fax: [phone]

## 2022-12-24 ENCOUNTER — Encounter: Payer: Self-pay | Admitting: Physical Therapy

## 2022-12-24 ENCOUNTER — Ambulatory Visit: Payer: 59 | Admitting: Physical Therapy

## 2022-12-24 DIAGNOSIS — M6281 Muscle weakness (generalized): Secondary | ICD-10-CM

## 2022-12-24 DIAGNOSIS — R2689 Other abnormalities of gait and mobility: Secondary | ICD-10-CM | POA: Diagnosis not present

## 2022-12-24 NOTE — Therapy (Addendum)
OUTPATIENT PHYSICAL THERAPY TREATMENT NOTE + NO VISIT DISCHARGE SUMMARY (see below)    Patient Name: [name] MRN: [id number] DOB:Mar 15, 1961, 62 y.o., female Today's Date: 12/24/2022  PCP: Quincy Simmonds, MD  REFERRING PROVIDER: Jerald Kief, MD   END OF SESSION:   PT End of Session - 12/24/22 1039     Visit Number 5    Number of Visits 12    Date for PT Re-Evaluation 01/23/23    Authorization Type [employer] MDC/MCD    Authorization Time Period no auth    Progress Note Due on Visit 10    PT Start Time 1035   pt late   PT Stop Time 1100    PT Time Calculation (min) 25 min              Past Medical History:  Diagnosis Date   Anemia 2009    Baseline hemoglobin at 11, secondary to iron deficiency   Arthritis    Breast mass 02/2008    Noted on mammogram March 11, 2008 3 cm simple cyst at 9 to 10:00 position of the right breast as well as multiple other smaller simple cyst, 2.6 cm mass at the 10:00 position of the right breast also representing complex cyst,   Deep vein thrombosis (DVT) (HCC)     history of multiple  DVTs in 1989, 1989, 2004, on chronic anticoagulation with Coumadin   Diverticulosis    noted colonoscopy 2010   Eczema    Fibroid uterus  2001    status post total abdominal hysterectomy, right and left salpingo-oophorectomy , done by Dr. 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 Clearance Coots   BREAST LUMPECTOMY WITH RADIOACTIVE SEED LOCALIZATION Right 07/09/2017   Procedure: RIGHT BREAST LUMPECTOMY WITH RADIOACTIVE SEED LOCALIZATION;  Surgeon: Harriette Bouillon, MD;  Location: [facility];  Service: General;  Laterality: Right;   CHOLECYSTECTOMY     1997   MASTECTOMY, PARTIAL  03/2006    left partial mastectomy secondary to fibrocystic disease intraluminal fibroadenoma performed March 28, 2006 done by Dr. Zachery Dakins   Patient Active Problem List   Diagnosis Date Noted   Binge eating disorder 12/08/2022   Stroke-like symptoms 11/20/2022   Diabetes type 2, controlled (HCC) 11/20/2022   Refusal of blood product 11/20/2022   Class 1 obesity due to excess calories with body mass index (BMI) of 34.0 to 34.9 in adult 11/20/2022   Tinnitus of right ear 06/06/2022   Hearing loss due to cerumen impaction, right 06/06/2022   Migraines 05/01/2022   Headache 04/10/2022   Prediabetes 01/05/2022   Hematochezia 01/05/2022   Back pain 09/04/2021   Limb cramps 03/30/2021   Atypical chest pain 02/14/2021   DOE (dyspnea on exertion) 02/04/2020   Shoulder impingement syndrome, right 02/04/2020  Depression 05/16/2018   Neuropathy 01/28/2018   Obesity, morbid (HCC) 09/28/2016   Dyslipidemia 01/27/2014   Headache(784.0) 09/29/2013   Bilateral knee pain 05/25/2013   Eczema 05/12/2013   Insomnia 05/12/2013   Diverticulosis 04/18/2012   Healthcare maintenance 02/07/2011   Rash 11/13/2007   Upper airway cough syndrome 12/03/2006   Essential hypertension 05/29/2006   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    REFERRING DIAG: R29.898 (ICD-10-CM) - Weakness of left lower extremity   THERAPY DIAG:  Muscle weakness (generalized)  Other abnormalities of gait and mobility  Rationale for Evaluation and Treatment Rehabilitation  PERTINENT HISTORY: HTN, migraines, DM2, hx DVT/PE, depression    PRECAUTIONS: recurrent DVT/PE   SUBJECTIVE:                                                                                                                                                                                      SUBJECTIVE STATEMENT:    12/24/2022 Pt reports she likes the gym machines. She overslept this morning and arrived late.   EVAL-Pt endorses onset of headache, blurred vision, and L hemibody weakness. Presented to [facility] hospital where she was admitted from 11/19/22-11/22/22, per chart received extensive neuro work up as below. Headache and blurred vision since resolved and pt has discharged home, but she notes L hemibody weakness has persisted (LE more affected than UE). Also endorses fatigue and reduced activity tolerance since hospitalization, now using cane. Describes herself as typically active and independent. Pt currently requiring assist with ADLs from family members, endorses significant difficulty with tub transfers and entering her home. Increased difficulty w/ housework. Denies any falls or recurrence of neuro symptoms. Denies any pain or N/T, does endorse some balance deficits.   PAIN:   Pt denies pain    FALLS:  Has patient fallen in last 6 months? No - endorses balance feeling "off" since onset of weakness but no LOB  OBJECTIVE: (objective measures completed at initial evaluation unless otherwise dated)   DIAGNOSTIC FINDINGS:  11/19/22 CTA/CTH IMPRESSION: 1. No intracranial large vessel occlusion or significant stenosis. 2. No hemodynamically significant stenosis in the neck.   11/20/22 MRI brain impression normal per EPIC   11/22/22 MRI C spine IMPRESSION: 1. Degenerative cervical spondylosis with multilevel disc disease and facet disease. 2. Moderate bilateral foraminal stenosis at C3-4, left greater than right. 3. Moderate bilateral foraminal stenosis at C4-5. 4. Left-sided disc osteophyte complex at C5-6 with significant medial foraminal stenosis likely  effecting the left C6 nerve root. 5. Broad-based left foraminal disc osteophyte complex at C6-7 with left foraminal stenosis likely effecting the left C7 nerve root.     PATIENT SURVEYS:  FOTO 65 current, 41  predicted   COGNITION: Overall cognitive status: Within functional limits for tasks assessed                          SENSATION/NEURO: Light touch intact BIL but diminished L hemibody, no extinction present Coordination testing unremarkable BIL UE  Gait impairments as below but no appreciable ataxia/incoordination of either LE    POSTURE: mild posterior lean, tendency to shift onto RLE in standing     RANGE OF MOTION:      Active  Right eval Left eval  Shoulder flexion      Functional ER combo      Functional IR combo      Knee extension      Ankle dorsiflexion       (Blank rows = not tested) (Key: WFL = within functional limits not formally assessed, * = concordant pain, s = stiffness/stretching sensation, NT = not tested)  Comments:     STRENGTH TESTING:   MMT Right eval Left eval  Shoulder flexion      Elbow flexion      Elbow extension      Grip strength (gross)      Hip flexion 3+ mild back pain 3-  Hip abduction (modified sitting) 5 4+  Knee flexion 4 3+  Knee extension 4- 3+  Ankle dorsiflexion 4 3+  Ankle plantarflexion       (Blank rows = not tested) (Key: WFL = within functional limits not formally assessed, * = concordant pain, s = stiffness/stretching sensation, NT = not tested)  Comments:     FUNCTIONAL TESTS:  Sit to stand: BUE support, weight shift to RLE, inc time/effort   GAIT: Distance walked: within clinic Assistive device utilized: Single point cane Level of assistance: Modified independence Comments: reduced truncal rotation and arm swing, reduced step length on L, inc stance time R. Use of SPC in RUE. Reduced gait speed and cadence   TODAY'S TREATMENT:     [facility] Adult PT Treatment:                                                 DATE: 12/24/22 Therapeutic Exercise: Nustep 5 UE/LE x 6 minutes  Knee ext 5# OMEGA bilat 10 x 2  Knee flex 25# OMEGA bilat 10 x 2  Leg press 50# bilateral 10 x 2  4 inch step up, 1 HR 6 inch step up , I HR    [facility] Adult PT Treatment:                                                DATE: 12/20/22 Therapeutic Exercise: Nustep L4 UE/LE x 5 minutes Knee ext 5# OMEGA bilat 10 x 2  Knee flex 20# OMEGA bilat 10 x 2  Leg press 40# bilateral 10 x 2  Standing heel raises 10 x 2  Gastroc stretch bilateral  6 inch step up left x 12  Bridge with feet over ball x 12  Left hip flexor stretch x 3 EOM.     [facility] Adult PT Treatment:  DATE: 12/17/22 Therapeutic Exercise: STS x10 from airex pads x2 with UE support STS 3x5 with tactile/verbal cues as needed  at quads and cues for symmetry of WB Seated>side plank + unilat push up 2x8 to L only  4 inch step LLE only x12  Therapeutic Activity: Education on relevant anatomy/physiology as it pertains to impairments and functional mobility Discussion/education re: DME, PT POC    [facility] Adult PT Treatment:                                                DATE: 12-13-22 Therapeutic Exercise: RTB for L knee extension 3 x 10 fatigues on last set RTB for L knee flexion  3 x 10 Sit to stand 3 x 10 with fatigue Bridge with ball  1 x 10 with 1/2 range                                                                                                                 PATIENT EDUCATION:  Education details: rationale for interventions, safety w/ mobility, DME Person educated: Patient Education method: Explanation, Demonstration, Tactile cues, Verbal cues Education comprehension: verbalized understanding, returned demonstration, verbal cues required, and needs further education     HOME EXERCISE PROGRAM: Access Code: [facility] URL: [email or web address] Date: 12/13/2022 Prepared by: Garen Lah  Exercises - Seated Knee Flexion with Anchored Resistance  - 1 x daily - 7 x weekly - 3 sets - 10 reps - Seated Knee Extension with Resistance  - 1 x daily - 7 x weekly - 3 sets - 10 reps - Supine Bridge with Mini Swiss Ball Between Knees  - 1 x daily - 7 x weekly - 3 sets - 10 reps - Sit to stand with sink support Movement snack  - 1 x daily - 7 x weekly - 3 sets - 10 reps   ASSESSMENT:   CLINICAL IMPRESSION: 12/24/2022 Pt arrives w/o pain, states exercises going well at home, she felt good after last session. She likes the gym machines and would like to continue them. Worked on small step ups today and able to progress to 6 inch and 1 UE with good form. She admits that she does not like touching railing due to germs and this forces her to walk sideways up the steps at her home. Shortened session today due to patient arriving late.  She reported that she got a good work out at end of session.     EVAL- Patient is a 62 y.o. woman who was seen today for physical therapy evaluation and treatment for LLE weakness - admitted to [facility] from 11/19/22-11/22/22, received extensive neurological workup (see EPIC for details). Pt endorses onset of L hemibody weakness along with headache and blurry vision on April 1st, states that headache/vision now normal but weakness has persisted, UE more subtle than LE. Also endorses fatigue and reduced activity tolerance  since discharge home, requiring assist from family for ADLs. On exam pt demos mild sensory deficits throughout L hemibody, as well as weakness throughout LLE, endorses subjective feeling of utilizing core muscles to compensate during MMT. No adverse events, pt tolerates session well. Pt inquisitive about return to general exercise - encouraged to follow up with her PCP to receive physician clearance given recent admission. Recommend skilled PT to address relevant deficits to maximize functional tolerance. Pt departs today's session in no acute  distress, all voiced questions/concerns addressed appropriately from PT perspective.     OBJECTIVE IMPAIRMENTS: Abnormal gait, decreased activity tolerance, decreased balance, decreased endurance, decreased mobility, difficulty walking, decreased strength, improper body mechanics, and postural dysfunction.    ACTIVITY LIMITATIONS: carrying, lifting, bending, squatting, stairs, transfers, dressing, hygiene/grooming, and locomotion level   PARTICIPATION LIMITATIONS: meal prep, cleaning, laundry, and community activity   PERSONAL FACTORS: Time since onset of injury/illness/exacerbation and 3+ comorbidities: HTN, migraines, DM2, hx DVT/PE, depression  are also affecting patient's functional outcome.    REHAB POTENTIAL: Fair given comorbidities   CLINICAL DECISION MAKING: Stable/uncomplicated   EVALUATION COMPLEXITY: Low     GOALS: Goals reviewed with patient? No given time constraints   SHORT TERM GOALS: Target date: 12/26/2022 Pt will demonstrate appropriate understanding and performance of initially prescribed HEP in order to facilitate improved independence with management of symptoms.  Baseline: HEP provided on eval 12/24/22: compliant  Goal status: MET     LONG TERM GOALS: Target date: 01/23/2023 Pt will score 68 or greater on FOTO in order to demonstrate improved perception of function due to symptoms.  Baseline: 65 Goal status: INITIAL   2.   Pt will be improve her 5xSTS score by at least MCID in order to demonstrate reduced fall risk and improved functional independence (MCID 5xSTS = 2.3 sec). Baseline: NT on eval given time constraints/fatigue Goal status: INITIAL    3. Pt will be able to perform lower body dressing without assist in order to foster improved independence w/ basic ADLs.             Baseline: requiring assist from family per pt report            Goal status: INITIAL   4. Pt will be able to safely navigate one flight of stairs with LRAD and RPE <5/10 in order to  improve safety/independence with home navigation.            Baseline: reports significant difficulty entering home, "takes 7-8 minutes"            Goal status: INITIAL    5. Pt will be able to safely ambulate at least 768ft with LRAD in order to promote improved community navigation.            Baseline: Requiring SPC (not using prior to hospitalization)            Goal status: INITIAL    PLAN:   PT FREQUENCY: 1-2x/week (2x/week for 4 weeks and then plan to taper to 1x/week)   PT DURATION: 8 weeks   PLANNED INTERVENTIONS: Therapeutic exercises, Therapeutic activity, Neuromuscular re-education, Balance training, Gait training, Patient/Family education, Self Care, Joint mobilization, Vestibular training, DME instructions, Aquatic Therapy, Cryotherapy, Moist heat, Taping, Manual therapy, and Re-evaluation.   PLAN FOR NEXT SESSION: 5xSTS. Review/update HEP PRN. Work on L hemibody strengthening with emphasis on LLE      Jannette Spanner, PTA 12/24/22 10:40 AM Phone: [phone] Fax: [phone]    Discharge addendum:  PHYSICAL THERAPY DISCHARGE SUMMARY  Visits from Start of Care: 5  Current functional level related to goals / functional outcomes: Unable to assess   Remaining deficits: Unable to assess   Education / Equipment: Unable to assess  Patient goals were  unable to be assessed . Patient is being discharged due to not returning since the last visit.   Ashley Murrain PT, DPT 03/07/2023 3:40 PM

## 2022-12-25 ENCOUNTER — Encounter: Payer: 59 | Admitting: Physical Therapy

## 2022-12-26 ENCOUNTER — Ambulatory Visit: Payer: 59 | Admitting: Physical Therapy

## 2022-12-27 ENCOUNTER — Encounter: Payer: 59 | Admitting: Physical Therapy

## 2022-12-30 ENCOUNTER — Other Ambulatory Visit: Payer: Self-pay | Admitting: Student

## 2022-12-31 ENCOUNTER — Ambulatory Visit: Payer: 59 | Admitting: Physical Therapy

## 2022-12-31 ENCOUNTER — Telehealth: Payer: Self-pay | Admitting: Physical Therapy

## 2022-12-31 NOTE — Telephone Encounter (Signed)
Called pt re: this morning's missed appt - no answer, unable to leave message, voicemail not set up

## 2023-01-01 ENCOUNTER — Encounter: Payer: 59 | Admitting: Physical Therapy

## 2023-01-02 ENCOUNTER — Ambulatory Visit: Payer: 59 | Admitting: Physical Therapy

## 2023-01-03 ENCOUNTER — Encounter: Payer: 59 | Admitting: Physical Therapy

## 2023-01-06 ENCOUNTER — Other Ambulatory Visit: Payer: Self-pay | Admitting: Internal Medicine

## 2023-01-06 DIAGNOSIS — E782 Mixed hyperlipidemia: Secondary | ICD-10-CM

## 2023-01-07 ENCOUNTER — Ambulatory Visit: Payer: 59 | Admitting: Physical Therapy

## 2023-01-08 ENCOUNTER — Encounter: Payer: 59 | Admitting: Physical Therapy

## 2023-01-09 ENCOUNTER — Ambulatory Visit: Payer: 59 | Admitting: Physical Therapy

## 2023-01-10 ENCOUNTER — Encounter: Payer: 59 | Admitting: Physical Therapy

## 2023-01-11 ENCOUNTER — Other Ambulatory Visit: Payer: Self-pay | Admitting: Student

## 2023-01-11 ENCOUNTER — Other Ambulatory Visit: Payer: Self-pay | Admitting: Internal Medicine

## 2023-01-11 DIAGNOSIS — R252 Cramp and spasm: Secondary | ICD-10-CM

## 2023-01-11 DIAGNOSIS — E782 Mixed hyperlipidemia: Secondary | ICD-10-CM

## 2023-01-16 ENCOUNTER — Encounter: Payer: Self-pay | Admitting: *Deleted

## 2023-01-21 ENCOUNTER — Ambulatory Visit (INDEPENDENT_AMBULATORY_CARE_PROVIDER_SITE_OTHER): Payer: 59 | Admitting: Licensed Clinical Social Worker

## 2023-01-21 DIAGNOSIS — F32A Depression, unspecified: Secondary | ICD-10-CM

## 2023-01-21 NOTE — BH Specialist Note (Signed)
Integrated Behavioral Health via Telemedicine Visit  01/21/2023 [name] [id number]  Number of Integrated Behavioral Health Clinician visits: 2- Second Visit  Session Start time: 1500   Session End time: 1520  Total time in minutes: 20   Referring Provider: Dickie La Patient/Family location: Home [facility] Provider location: Office All persons participating in visit: Patient and [facility] Types of Service: Individual psychotherapy and General Behavioral Integrated Care (BHI)   I connected with [name]  via  Telephone or Video Enabled Telemedicine Application  (Video is Caregility application) and verified that I am speaking with the correct person using two identifiers. Discussed confidentiality: Yes    I discussed the limitations of telemedicine and the availability of in person appointments.  Discussed there is a possibility of technology failure and discussed alternative modes of communication if that failure occurs.   I discussed that engaging in this telemedicine visit, they consent to the provision of behavioral healthcare.   Patient and/or legal guardian expressed understanding and consented to Telemedicine visit: Yes    Presenting Concerns: Patient and/or family reports the following symptoms/concerns: Emotional Eating Duration of problem: Years; Severity of problem: severe   Patient and/or Family's Strengths/Protective Factors: Social connections   Goals Addressed: Patient will:  Reduce symptoms of: compulsions    Progress towards Goals: Ongoing   Interventions: Interventions utilized:  Solution-Focused Strategies, Mindfulness or Relaxation Training, and Supportive Counseling Standardized Assessments completed: PHQ-SADS     09/11/2022    9:23 AM 08/06/2022    1:21 PM 06/06/2022   10:34 AM  PHQ-SADS Last 3 Score only  PHQ Adolescent Score 0 0 0    [facility] unable to complete assessment at time of visit.     Assessment: Patient advised she had been  contacting the wrong provider for follow up appointments. Patient is interested in continuing therapy sessions with [facility]. Patient requested to schedule an appointment for July 3rd  at [facility] via telephone.    Patient may benefit from Solution-Focused Strategies, Mindfulness or Relaxation Training, and Supportive Counseling.   Plan: Follow up with behavioral health clinician on : Patient will schedule next visit within 30 days.   I discussed the assessment and treatment plan with the patient and/or parent/guardian. They were provided an opportunity to ask questions and all were answered. They agreed with the plan and demonstrated an understanding of the instructions.   They were advised to call back or seek an in-person evaluation if the symptoms worsen or if the condition fails to improve as anticipated. [name], MSW, LCSW-A She/Her Behavioral Health Clinician [facility] Direct Dial:[phone]  Fax [phone] Main Office Phone: [phone] [address] [place] [place], [place] [address] Website: [facility]  [place], [place]  [place]

## 2023-02-04 ENCOUNTER — Encounter: Payer: Self-pay | Admitting: Student

## 2023-02-04 ENCOUNTER — Ambulatory Visit (INDEPENDENT_AMBULATORY_CARE_PROVIDER_SITE_OTHER): Payer: 59 | Admitting: Student

## 2023-02-04 ENCOUNTER — Ambulatory Visit (INDEPENDENT_AMBULATORY_CARE_PROVIDER_SITE_OTHER): Payer: 59

## 2023-02-04 ENCOUNTER — Other Ambulatory Visit: Payer: Self-pay

## 2023-02-04 VITALS — BP 129/82 | HR 71 | Temp 98.3°F | Ht 71.0 in | Wt 247.7 lb

## 2023-02-04 DIAGNOSIS — F5101 Primary insomnia: Secondary | ICD-10-CM | POA: Diagnosis not present

## 2023-02-04 DIAGNOSIS — I10 Essential (primary) hypertension: Secondary | ICD-10-CM

## 2023-02-04 DIAGNOSIS — E6609 Other obesity due to excess calories: Secondary | ICD-10-CM | POA: Diagnosis not present

## 2023-02-04 DIAGNOSIS — Z9189 Other specified personal risk factors, not elsewhere classified: Secondary | ICD-10-CM | POA: Diagnosis not present

## 2023-02-04 DIAGNOSIS — Z6834 Body mass index (BMI) 34.0-34.9, adult: Secondary | ICD-10-CM | POA: Diagnosis not present

## 2023-02-04 DIAGNOSIS — Z0000 Encounter for general adult medical examination without abnormal findings: Secondary | ICD-10-CM | POA: Diagnosis not present

## 2023-02-04 MED ORDER — BLOOD PRESSURE MONITOR DEVI: 0 refills | Status: AC

## 2023-02-04 NOTE — Assessment & Plan Note (Addendum)
Patient has been tolerating maximum dose of Ozempic for the last few months. Her weight has been stable since last office visit two months ago. She had physical therapy after her TIA but reports that she has had difficulty getting herself motivated to go to the gym since the TIA. She has been watching her diet and decrease her intake of fatty and sugary foods but understands that she has to get back to exercising.  I discussed plan to refer her to the supervised exercise program and she is agreeable to this. Body mass index is 34.55 kg/m. Filed Weights   02/04/23 0938  Weight: 247 lb 11.2 oz (112.4 kg)   Plan: -Continue Ozempic 2 mg weekly -Referral to P.R.E.P -Continue lifestyle modifications with dietary changes and exercise

## 2023-02-04 NOTE — Progress Notes (Signed)
Subjective:   [name] is a 62 y.o. female who presents for Medicare Annual (Subsequent) preventive examination. I connected with  Izetta Dakin on 02/04/23 by a  Face-To-Face encounter   and verified that I am speaking with the correct person using two identifiers.  Patient Location: Other:  Office/Clinic  Provider Location: Office/Clinic  I discussed the limitations of evaluation and management by telemedicine. The patient expressed understanding and agreed to proceed.  Review of Systems    Defer to PCP       Objective:    Today's Vitals   02/04/23 1103  BP: 129/82  Pulse: 71  Temp: 98.3 F (36.8 C)  TempSrc: Oral  SpO2: 100%  Weight: 247 lb 11.2 oz (112.4 kg)  Height: 5\' 11"  (1.803 m)  PainSc: 0-No pain   Body mass index is 34.55 kg/m.     02/04/2023   11:04 AM 02/04/2023    9:43 AM 11/28/2022    1:43 PM 11/19/2022    8:01 PM 09/26/2022    9:44 AM 09/11/2022    9:22 AM 04/10/2022    2:31 PM  Advanced Directives  Does Patient Have a Medical Advance Directive? No No No No Yes No No  Type of Advance Directive     Living will    Does patient want to make changes to medical advance directive?     No - Patient declined    Would patient like information on creating a medical advance directive? No - Patient declined No - Patient declined No - Patient declined No - Patient declined  No - Patient declined No - Patient declined    Current Medications (verified) Outpatient Encounter Medications as of 02/04/2023  Medication Sig   albuterol (VENTOLIN HFA) 108 (90 Base) MCG/ACT inhaler INHALE 1-2 PUFFS BY MOUTH EVERY 6 HOURS AS NEEDED FOR WHEEZE OR SHORTNESS OF BREATH   cyclobenzaprine (FLEXERIL) 5 MG tablet TAKE 1 TABLET BY MOUTH AT BEDTIME AS NEEDED FOR MUSCLE SPASMS.   Multiple Vitamin (MULTIVITAMIN) capsule Take 1 capsule daily by mouth.   OZEMPIC, 1 MG/DOSE, 4 MG/3ML SOPN Inject 2 mg into the skin once a week.   rosuvastatin (CRESTOR) 20 MG tablet TAKE 1 TABLET  BY MOUTH EVERY DAY   No facility-administered encounter medications on file as of 02/04/2023.    Allergies (verified) Dilaudid [hydromorphone hcl]   History: Past Medical History:  Diagnosis Date   Anemia 2009    Baseline hemoglobin at 11, secondary to iron deficiency   Arthritis    Breast mass 02/2008    Noted on mammogram March 11, 2008 3 cm simple cyst at 9 to 10:00 position of the right breast as well as multiple other smaller simple cyst, 2.6 cm mass at the 10:00 position of the right breast also representing complex cyst,   Deep vein thrombosis (DVT) (HCC)     history of multiple  DVTs in 1989, 1989, 2004, on chronic anticoagulation with Coumadin   Diverticulosis    noted colonoscopy 2010   Eczema    Fibroid uterus  2001    status post total abdominal hysterectomy, right and left salpingo-oophorectomy , done by Dr. 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 Clearance Coots   BREAST LUMPECTOMY WITH RADIOACTIVE SEED LOCALIZATION Right 07/09/2017   Procedure: RIGHT BREAST LUMPECTOMY WITH RADIOACTIVE SEED LOCALIZATION;  Surgeon: Harriette Bouillon, MD;  Location: [facility];  Service: General;  Laterality: Right;   CHOLECYSTECTOMY     1997   MASTECTOMY, PARTIAL  03/2006    left partial mastectomy secondary to fibrocystic disease  intraluminal fibroadenoma performed March 28, 2006 done by Dr. Zachery Dakins   Family History  Problem Relation Age of Onset   Gout Father    Stroke Other    Cancer Other    Clotting disorder Other    Social History   Socioeconomic History   Marital status: Married    Spouse name: Not on file   Number of children: Not on file   Years of education: Not on file   Highest education level: Not on file  Occupational History   Occupation: unemployed  Tobacco Use   Smoking status: Former    Types: Cigarettes   Smokeless tobacco: Never  Vaping Use   Vaping Use: Never used  Substance and Sexual Activity   Alcohol use: Yes    Comment: rare   Drug use: No   Sexual activity: Not on file  Other Topics Concern   Not on file  Social History Narrative   Not on file   Social Determinants of Health   Financial Resource Strain: High Risk (02/04/2023)   Overall Financial Resource Strain (CARDIA)    Difficulty of Paying Living Expenses: Hard  Food Insecurity: Food Insecurity Present (02/04/2023)   Hunger Vital Sign    Worried About Running Out of Food in the Last Year: Often true    Ran Out of Food in the Last Year: Sometimes true  Transportation Needs: Unmet Transportation Needs (02/04/2023)   PRAPARE - Transportation    Lack of Transportation (Medical): Yes    Lack of Transportation (Non-Medical): Yes  Physical Activity: Insufficiently Active (02/04/2023)   Exercise Vital Sign    Days of Exercise per Week: 2 days    Minutes of Exercise per Session: 30 min  Stress: Stress Concern Present (02/04/2023)   [employer] of Occupational Health - Occupational Stress Questionnaire    Feeling of Stress : Very much  Social Connections: Moderately Integrated (02/04/2023)   Social Connection and Isolation Panel [NHANES]    Frequency of Communication with Friends and Family: More than three times a week    Frequency of Social Gatherings with Friends and Family: Never    Attends Religious  Services: More than 4 times per year    Active Member of [employer] or Organizations: Yes    Attends Banker Meetings: 1 to 4 times per year    Marital Status: Divorced    Tobacco Counseling Counseling given: Not Answered   Clinical Intake:  Pre-visit preparation completed: Yes  Pain : No/denies pain Pain Score: 0-No pain     Nutritional Risks: None Diabetes: Yes CBG done?: No Did pt. bring in CBG monitor from home?: No  How often do you need to have someone help you  when you read instructions, pamphlets, or other written materials from your doctor or pharmacy?: 1 - Never What is the last grade level you completed in school?: 13 years  Diabetic?Nutrition Risk Assessment:  Has the patient had any N/V/D within the last 2 months?  No  Does the patient have any non-healing wounds?  No  Has the patient had any unintentional weight loss or weight gain?  No   Diabetes:  Is the patient diabetic?  Yes  If diabetic, was a CBG obtained today?  No  Did the patient bring in their glucometer from home?  No   Financial Strains and Diabetes Management:  Are you having any financial strains with the device, your supplies or your medication? No .  Does the patient want to be seen by Chronic Care Management for management of their diabetes?  No  Would the patient like to be referred to a Nutritionist or for Diabetic Management?  No   Diabetic Exams:  Diabetic Eye Exam: Completed N/A Diabetic Foot Exam: Completed 04/13/2016 has been discontinued.    Interpreter Needed?: No  Information entered by :: Stephanine Reas,cma   Activities of Daily Living    02/04/2023   11:04 AM 02/04/2023    9:42 AM  In your present state of health, do you have any difficulty performing the following activities:  Hearing? 0 0  Vision? 0 0  Difficulty concentrating or making decisions? 0 0  Walking or climbing stairs? 1 1  Dressing or bathing? 0 0  Doing errands, shopping? 0 0    Patient  Care Team: Chauncey Mann, DO as PCP - General (Internal Medicine)  Indicate any recent Medical Services you may have received from other than [facility] providers in the past year (date may be approximate).     Assessment:   This is a routine wellness examination for [place].  Hearing/Vision screen No results found.  Dietary issues and exercise activities discussed:     Goals Addressed   None   Depression Screen    02/04/2023   11:04 AM 02/04/2023    9:42 AM 12/06/2022   11:00 AM 09/26/2022    9:44 AM 09/11/2022    9:23 AM 08/06/2022    1:21 PM 06/06/2022   10:34 AM  PHQ 2/9 Scores  PHQ - 2 Score 0 0 3  0 0 0  PHQ- 9 Score 6 6 11   0    Exception Documentation    Patient refusal       Fall Risk    02/04/2023   11:04 AM 02/04/2023    9:42 AM 09/26/2022    9:44 AM 09/11/2022    9:22 AM 04/10/2022    2:28 PM  Fall Risk   Falls in the past year? 0 0 0 0 0  Number falls in past yr: 0  0  0  Injury with Fall? 0  0  0  Risk for fall due to : No Fall Risks No Fall Risks No Fall Risks No Fall Risks   Follow up Falls evaluation completed;Falls prevention discussed Falls evaluation completed Falls evaluation completed;Falls prevention discussed Falls evaluation completed Falls evaluation completed    FALL RISK PREVENTION PERTAINING TO THE HOME:  Any stairs in or around the home? Yes  If so, are there any without handrails? No  Home free of loose throw rugs in walkways, pet beds, electrical cords, etc? Yes  Adequate lighting in your home to reduce risk of falls? Yes   ASSISTIVE DEVICES UTILIZED  TO PREVENT FALLS:  Life alert? Yes  Use of a cane, walker or w/c? Yes  Grab bars in the bathroom? No  Shower chair or bench in shower? No  Elevated toilet seat or a handicapped toilet? No   TIMED UP AND GO:  Was the test performed? No .  Length of time to ambulate 10 feet: 0 sec.   Gait slow and steady without use of assistive device  Cognitive Function:        02/04/2023   11:04  AM 01/03/2022    9:26 AM  6CIT Screen  What Year? 0 points 0 points  What month? 0 points 0 points  What time? 0 points 0 points  Count back from 20 0 points 0 points  Months in reverse 0 points 0 points  Repeat phrase 0 points 0 points  Total Score 0 points 0 points    Immunizations Immunization History  Administered Date(s) Administered   Influenza,inj,Quad PF,6+ Mos 05/12/2013, 06/20/2021   PFIZER(Purple Top)SARS-COV-2 Vaccination 10/31/2019, 10/22/2020   Tdap 10/01/2011    TDAP status: Due, Education has been provided regarding the importance of this vaccine. Advised may receive this vaccine at local pharmacy or Health Dept. Aware to provide a copy of the vaccination record if obtained from local pharmacy or Health Dept. Verbalized acceptance and understanding.  Flu Vaccine status: Up to date  Pneumococcal vaccine status: Due, Education has been provided regarding the importance of this vaccine. Advised may receive this vaccine at local pharmacy or Health Dept. Aware to provide a copy of the vaccination record if obtained from local pharmacy or Health Dept. Verbalized acceptance and understanding.  Covid-19 vaccine status: Completed vaccines  Qualifies for Shingles Vaccine? No   Zostavax completed No   Shingrix Completed?: No.    Education has been provided regarding the importance of this vaccine. Patient has been advised to call insurance company to determine out of pocket expense if they have not yet received this vaccine. Advised may also receive vaccine at local pharmacy or Health Dept. Verbalized acceptance and understanding.  Screening Tests Health Maintenance  Topic Date Due   Diabetic kidney evaluation - Urine ACR  02/14/2008   Zoster Vaccines- Shingrix (1 of 2) Never done   OPHTHALMOLOGY EXAM  05/27/2019   DTaP/Tdap/Td (2 - Td or Tdap) 09/30/2021   MAMMOGRAM  03/08/2022   COVID-19 Vaccine (3 - 2023-24 season) 04/20/2022   HEMOGLOBIN A1C  03/12/2023   INFLUENZA  VACCINE  03/21/2023   Diabetic kidney evaluation - eGFR measurement  11/22/2023   Medicare Annual Wellness (AWV)  02/04/2024   Colonoscopy  02/16/2027   Hepatitis C Screening  Completed   HIV Screening  Completed   HPV VACCINES  Aged Out   FOOT EXAM  Discontinued    Health Maintenance  Health Maintenance Due  Topic Date Due   Diabetic kidney evaluation - Urine ACR  02/14/2008   Zoster Vaccines- Shingrix (1 of 2) Never done   OPHTHALMOLOGY EXAM  05/27/2019   DTaP/Tdap/Td (2 - Td or Tdap) 09/30/2021   MAMMOGRAM  03/08/2022   COVID-19 Vaccine (3 - 2023-24 season) 04/20/2022    Colorectal cancer screening: Type of screening: Colonoscopy. Completed 02/15/2022. Repeat every 5 years  Lung Cancer Screening: (Low Dose CT Chest recommended if Age 50-80 years, 30 pack-year currently smoking OR have quit w/in 15years.) does not qualify.   Lung Cancer Screening Referral: N/A  Additional Screening:  Hepatitis C Screening: does not qualify; Completed 04/16/2012  Vision Screening: Recommended annual  ophthalmology exams for early detection of glaucoma and other disorders of the eye. Is the patient up to date with their annual eye exam?  Yes  Who is the provider or what is the name of the office in which the patient attends annual eye exams? N/A If pt is not established with a provider, would they like to be referred to a provider to establish care? No .   Dental Screening: Recommended annual dental exams for proper oral hygiene  Community Resource Referral / Chronic Care Management: CRR required this visit?  No   CCM required this visit?  No      Plan:     I have personally reviewed and noted the following in the patient's chart:   Medical and social history Use of alcohol, tobacco or illicit drugs  Current medications and supplements including opioid prescriptions. Patient is not currently taking opioid prescriptions. Functional ability and status Nutritional status Physical  activity Advanced directives List of other physicians Hospitalizations, surgeries, and ER visits in previous 12 months Vitals Screenings to include cognitive, depression, and falls Referrals and appointments  In addition, I have reviewed and discussed with patient certain preventive protocols, quality metrics, and best practice recommendations. A written personalized care plan for preventive services as well as general preventive health recommendations were provided to patient.     Cala Bradford, [facility]   02/04/2023   Nurse Notes: Face-To-Face Visit  Ms. Lyda Perone , Thank you for taking time to come for your Medicare Wellness Visit. I appreciate your ongoing commitment to your health goals. Please review the following plan we discussed and let me know if I can assist you in the future.   These are the goals we discussed:  Goals      Blood Pressure < 140/90        This is a list of the screening recommended for you and due dates:  Health Maintenance  Topic Date Due   Yearly kidney health urinalysis for diabetes  02/14/2008   Zoster (Shingles) Vaccine (1 of 2) Never done   Eye exam for diabetics  05/27/2019   DTaP/Tdap/Td vaccine (2 - Td or Tdap) 09/30/2021   Mammogram  03/08/2022   COVID-19 Vaccine (3 - 2023-24 season) 04/20/2022   Hemoglobin A1C  03/12/2023   Flu Shot  03/21/2023   Yearly kidney function blood test for diabetes  11/22/2023   Medicare Annual Wellness Visit  02/04/2024   Colon Cancer Screening  02/16/2027   Hepatitis C Screening  Completed   HIV Screening  Completed   HPV Vaccine  Aged Out   Complete foot exam   Discontinued

## 2023-02-04 NOTE — Assessment & Plan Note (Addendum)
Patient reports a history of insomnia over the last few years but has not improved with medications including melatonin and trazodone. She has been counseled multiple times on sleep hygiene which patient states she has been doing without significant improvement. She reports she has been told that she snores. She does not feel well rested during the day and only sleep for a few hours.  Based on her STOP-BANG score, patient is at intermediate risk for OSA. There was plan to refer her for sleep study back in 2022 however I do not see this referral in place and patient has never had sleep study.   Plan: -Referral for sleep study -Continue sleep hygiene

## 2023-02-04 NOTE — Progress Notes (Signed)
Internal Medicine Clinic Attending  Case discussed with Dr. Amponsah  At the time of the visit.  We reviewed the resident's history and exam and pertinent patient test results.  I agree with the assessment, diagnosis, and plan of care documented in the resident's note.  

## 2023-02-04 NOTE — Progress Notes (Signed)
CC: Blood pressure follow-up  HPI:  [name] is a 62 y.o. female with PMH as below presents to clinic to follow-up on her blood pressure. Please see problem based charting for evaluation, assessment and plan.  Past Medical History:  Diagnosis Date   Anemia 2009    Baseline hemoglobin at 11, secondary to iron deficiency   Arthritis    Breast mass 02/2008    Noted on mammogram March 11, 2008 3 cm simple cyst at 9 to 10:00 position of the right breast as well as multiple other smaller simple cyst, 2.6 cm mass at the 10:00 position of the right breast also representing complex cyst,   Deep vein thrombosis (DVT) (HCC)     history of multiple  DVTs in 1989, 1989, 2004, on chronic anticoagulation with Coumadin   Diverticulosis    noted colonoscopy 2010   Eczema    Fibroid uterus  2001    status post total abdominal hysterectomy, right and left salpingo-oophorectomy , done by Dr. 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 No acute distress. Cardiac: RRR. No murmurs, rubs or gallops. No LE  edema Respiratory: Lungs CTAB. No wheezing or crackles. Abdominal: Soft, symmetric and non tender. Normal BS. Extremities: Atraumatic. Full ROM. Palpable radial and DP pulses. Neuro: A&O x 3. Moves all extremities. Normal sensation to gross touch. Psych: Appropriate mood and affect.  Vitals:   02/04/23 0938  BP: 129/82  Pulse: 71  Temp: 98.3 F (36.8 C)  TempSrc: Oral  SpO2: 100%  Weight: 247 lb 11.2 oz (112.4 kg)  Height: 5\' 11"  (1.803 m)    Assessment & Plan:   Essential hypertension Patient with a history of well-controlled hypertension here for follow-up on her blood pressure. Patient states she  office last week when they checked her blood pressure, it was in the 170/100s.  Blood pressure was still high when she went back for her dental fitting.  Because of her history of TIA, she was very worried about her blood pressure and wanted to be evaluated today.  Her BP is well-controlled in clinic today with SBP in the 120s, similar to her previous readings.  She denies any headaches, dizziness, shortness of breath, vision changes or chest pain. She has been off antihypertensive medication since last year. She is interested in getting a blood pressure cuff to check her BP at home. Vitals:   02/04/23 0938  BP: 129/82   Plan: -Prescription for BP monitoring device to check BP at home -Continue lifestyle modifications with dietary changes and exercise  Class 1 obesity due to excess calories with  body mass index (BMI) of 34.0 to 34.9 in adult Patient has been tolerating maximum dose of Ozempic for the last few months. Her weight has been stable since last office visit two months ago. She had physical therapy after her TIA but reports that she has had difficulty getting herself motivated to go to the gym since the TIA. She has been watching her diet and decrease her intake of fatty and sugary foods but understands that she has to get back to exercising.  I discussed plan to refer her to the  supervised exercise program and she is agreeable to this. Body mass index is 34.55 kg/m. Filed Weights   02/04/23 0938  Weight: 247 lb 11.2 oz (112.4 kg)   Plan: -Continue Ozempic 2 mg weekly -Referral to P.R.E.P -Continue lifestyle modifications with dietary changes and exercise  At risk for obstructive sleep apnea Patient reports a history of insomnia over the last few years but has not improved with medications including melatonin and trazodone. She has been counseled multiple times on sleep hygiene which patient states she has been doing without significant improvement. She reports she has been told that she snores. She does not feel well rested during the day and only sleep for a few hours.  Based on her STOP-BANG score, patient is at intermediate risk for OSA. There was plan to refer her for sleep study back in 2022 however I do not see this referral in place and patient has never had sleep study.   Plan: -Referral for sleep study -Continue sleep hygiene   See Encounters Tab for problem based charting.  Patient discussed with Dr. [name], MD, MPH

## 2023-02-04 NOTE — Assessment & Plan Note (Addendum)
Patient with a history of well-controlled hypertension here for follow-up on her blood pressure. Patient states she  office last week when they checked her blood pressure, it was in the 170/100s.  Blood pressure was still high when she went back for her dental fitting.  Because of her history of TIA, she was very worried about her blood pressure and wanted to be evaluated today.  Her BP is well-controlled in clinic today with SBP in the 120s, similar to her previous readings.  She denies any headaches, dizziness, shortness of breath, vision changes or chest pain. She has been off antihypertensive medication since last year. She is interested in getting a blood pressure cuff to check her BP at home. Vitals:   02/04/23 0938  BP: 129/82   Plan: -Prescription for BP monitoring device to check BP at home -Continue lifestyle modifications with dietary changes and exercise

## 2023-02-04 NOTE — Patient Instructions (Signed)
Thank you, Ms.Izetta Dakin for allowing [place] to provide your care today. Today we discussed your blood pressure, insomnia, weight gain and cholesterol.    I have ordered a blood pressure cuff for you to pick up at your local pharmacy.  Check your blood pressure daily  I have place a referrals to our supervised exercise program and for you to get a sleep study to evaluate you for sleep apnea  My Chart Access: [email or web address]  Please follow-up in 3 months  Please make sure to arrive 15 minutes prior to your next appointment. If you arrive late, you may be asked to reschedule.    We look forward to seeing you next time. Please call our clinic at [phone] if you have any questions or concerns. The best time to call is Monday-Friday from 9am-4pm, but there is someone available 24/7. If after hours or the weekend, call the main hospital number and ask for the Internal Medicine Resident On-Call. If you need medication refills, please notify your pharmacy one week in advance and they will send [place] a request.   Thank you for letting us take part in your care. Wishing you the best!  Steffanie Rainwater, MD 02/04/2023, 10:23 AM IM Resident, PGY-3 [facility] 41:10

## 2023-02-04 NOTE — Addendum Note (Signed)
Addended bySharrell Ku on: 02/04/2023 05:20 PM   Modules accepted: Level of Service

## 2023-02-04 NOTE — Assessment & Plan Note (Signed)
>>  ASSESSMENT AND PLAN FOR CLASS 1 OBESITY DUE TO EXCESS CALORIES WITH BODY MASS INDEX (BMI) OF 34.0 TO 34.9 IN ADULT WRITTEN ON 02/04/2023  1:45 PM BY AMPONSAH, PROSPER M, MD  Patient has been tolerating maximum dose of Ozempic for the last few months. Her weight has been stable since last office visit two months ago. She had physical therapy after her TIA but reports that she has had difficulty getting herself motivated to go to the gym since the TIA. She has been watching her diet and decrease her intake of fatty and sugary foods but understands that she has to get back to exercising.  I discussed plan to refer her to the supervised exercise program and she is agreeable to this. Body mass index is 34.55 kg/m. Filed Weights   02/04/23 0938  Weight: 247 lb 11.2 oz (112.4 kg)   Plan: -Continue Ozempic 2 mg weekly -Referral to P.R.E.P -Continue lifestyle modifications with dietary changes and exercise

## 2023-02-07 ENCOUNTER — Telehealth: Payer: Self-pay

## 2023-02-07 NOTE — Progress Notes (Signed)
Internal Medicine Clinic Attending  Case, documentation, and findings reviewed. I agree with the assessment, diagnosis, and plan of care as outlined in the AWV note.

## 2023-02-07 NOTE — Telephone Encounter (Signed)
Call to pt reference PREP referral Explained program. Prefers morning classes Next [id number] MW class will start 03/18/23.  Agreeable to starting then.  Will contact prior to start of class to schedule intake/initial assessment and goal setting  Text her my contact info.

## 2023-02-14 ENCOUNTER — Ambulatory Visit (INDEPENDENT_AMBULATORY_CARE_PROVIDER_SITE_OTHER): Payer: 59 | Admitting: Licensed Clinical Social Worker

## 2023-02-14 DIAGNOSIS — F32A Depression, unspecified: Secondary | ICD-10-CM | POA: Diagnosis not present

## 2023-02-14 NOTE — BH Specialist Note (Signed)
Integrated Behavioral Health via Telemedicine Visit  02/14/2023 [name] [id number]  Number of Integrated Behavioral Health Clinician visits:Additional visit  Session Start time: 945   Session End time: 1004  Total time in minutes: 19   Referring Provider: Dickie [name] Patient/Family location: Home [name] Provider location: Office All persons participating in visit: Patient and [name] Types of Service: Individual psychotherapy and General Behavioral Integrated Care (BHI)   I connected with [name]  via  Telephone or Video Enabled Telemedicine Application  (Video is Caregility application) and verified that I am speaking with the correct person using two identifiers. Discussed confidentiality: Yes    I discussed the limitations of telemedicine and the availability of in person appointments.  Discussed there is a possibility of technology failure and discussed alternative modes of communication if that failure occurs.   I discussed that engaging in this telemedicine visit, they consent to the provision of behavioral healthcare.   Patient and/or legal guardian expressed understanding and consented to Telemedicine visit: Yes    Presenting Concerns: Patient and/or family reports the following symptoms/concerns: Emotional Eating Duration of problem: Years; Severity of problem: severe   Patient and/or Family's Strengths/Protective Factors: Social connections   Goals Addressed: Patient will:  Reduce symptoms of: compulsions    Progress towards Goals: Ongoing   Interventions: Interventions utilized:  Solution-Focused Strategies, Mindfulness or Relaxation Training, and Supportive Counseling Standardized Assessments completed: PHQ-SADS     09/11/2022    9:23 AM 08/06/2022    1:21 PM 06/06/2022   10:34 AM  PHQ-SADS Last 3 Score only  PHQ Adolescent Score 0 0 0    BHC unable to complete assessment at time of visit.     Assessment:    Patient advised deep  breathing exercising are not working at the moment.   Patient is still experiencing anxiety.   Patient and [facility] discussed triggers. Patient and [name] discussed patient using her Shitzu as emotional support during anxiety attacks. Patient agreed that "[name]" Her dog helps her and is a big help.  Patient also stated music is helpful. Patient and [facility] agreed for patient to utilize music and [name] to cope with anxiety attacks.   Patient was out in public and patient couldn't speak freely. Patient requested to end call and schedule in person visit for July 15 th at 1:30 pm.     Plan: Follow up with behavioral health clinician on : Patient will schedule next visit within 30 days.   I discussed the assessment and treatment plan with the patient and/or parent/guardian. They were provided an opportunity to ask questions and all were answered. They agreed with the plan and demonstrated an understanding of the instructions.   They were advised to call back or seek an in-person evaluation if the symptoms worsen or if the condition fails to improve as anticipated. [name], MSW, LCSW-A She/Her Behavioral Health Clinician [facility]  Internal Medicine [name] Direct Dial:[phone]  Fax [phone] Main Office Phone: [phone] [address] [place] [place], [place] [address] Website: [facility] Internal Medicine [facility]  [place], [place]  [place]

## 2023-02-14 NOTE — Addendum Note (Signed)
Addended by: Rich Reining on: 02/14/2023 10:08 AM   Modules accepted: Level of Service

## 2023-02-14 NOTE — BH Specialist Note (Deleted)
[facility] attempted patient. Call was unsuccessful. Unable to leave a VM due to VM not being set up.    Patient will need to reschedule appointment by calling Internal medicine center [phone].  [name], MSW, LCSW-A She/Her Behavioral Health Clinician [facility]  Internal Medicine Center Direct [phone]  Fax [phone] Main Office Phone: [phone] [address] [place] [place], [place] [address] Website: [facility] Internal Medicine [facility]  [place], [place]  [place]

## 2023-02-28 ENCOUNTER — Ambulatory Visit: Payer: 59 | Admitting: Neurology

## 2023-02-28 ENCOUNTER — Encounter: Payer: Self-pay | Admitting: Neurology

## 2023-03-04 ENCOUNTER — Ambulatory Visit (INDEPENDENT_AMBULATORY_CARE_PROVIDER_SITE_OTHER): Payer: 59 | Admitting: Licensed Clinical Social Worker

## 2023-03-04 DIAGNOSIS — F32A Depression, unspecified: Secondary | ICD-10-CM

## 2023-03-04 NOTE — BH Specialist Note (Signed)
Integrated Behavioral Health via Telemedicine Visit  03/04/2023 [name] [id number]  Number of Integrated Behavioral Health Clinician visits: 3- Third Visit  Session Start time: 1330   Session End time: 1430  Total time in minutes: 60   Referring Provider: Dickie La Patient/Family location: Home [facility] Provider location: Office All persons participating in visit: Patient and [facility] Types of Service: Individual psychotherapy and General Behavioral Integrated Care (BHI)   I connected with [name]  via  Telephone or Video Enabled Telemedicine Application  (Video is Caregility application) and verified that I am speaking with the correct person using two identifiers. Discussed confidentiality: Yes    I discussed the limitations of telemedicine and the availability of in person appointments.  Discussed there is a possibility of technology failure and discussed alternative modes of communication if that failure occurs.   I discussed that engaging in this telemedicine visit, they consent to the provision of behavioral healthcare.   Patient and/or legal guardian expressed understanding and consented to Telemedicine visit: Yes    Presenting Concerns: Patient and/or family reports the following symptoms/concerns: Patient states she is wanting to find a life of her own. Patient express lonliness and not having friends. 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   [facility] and patient wrote down activities patient enjoys. Patient agreed to completing an activity weekly. This week patient will go to the Movies to see "sound of hope". Patient excited about attending the movies.   [facility] referred patient to the [facility]. Patient excited about activities and meeting new people.   Patient discussed planning her 63th birthday. Patient wasn't able to attend her prom due to the death of her mother. [facility] and patient discussed having a prom  them titles " [employer]". Patient will work on planning her birthday.   Duration of problem: Years; Severity of problem: severe   Patient and/or Family's Strengths/Protective Factors: Social connections   Goals Addressed: Patient will:  Reduce symptoms of: compulsions    Progress towards Goals: Ongoing   Interventions: Interventions utilized:  Solution-Focused Strategies, Mindfulness or Relaxation Training, and Supportive Counseling Standardized Assessments completed: PHQ-SADS     09/11/2022    9:23 AM 08/06/2022    1:21 PM 06/06/2022   10:34 AM  PHQ-SADS Last 3 Score only  PHQ Adolescent Score 0 0 0    BHC unable to complete assessment at time of visit.     Assessment:  Patient experiencing depression and loneliness.         Plan: Follow up with behavioral health clinician on : August 12 at 1:30 pm In person   I discussed the assessment and treatment plan with the patient and/or parent/guardian. They were provided an opportunity to ask questions and all were answered. They agreed with the plan and demonstrated an understanding of the instructions.   They were advised to call back or seek an in-person evaluation if the symptoms worsen or if the condition fails to improve as anticipated. [name], MSW, LCSW-A She/Her Behavioral Health Clinician [facility]  Internal Medicine Center Direct [phone]  Fax [phone] Main Office Phone: [phone] [address] [place] [place], [place] [address] Website: [facility] Internal Medicine [facility]  [place], [place]  [place]

## 2023-03-08 ENCOUNTER — Telehealth: Payer: Self-pay

## 2023-03-08 NOTE — Telephone Encounter (Signed)
Call to pt to confirm PREP for 03/18/23. Unable to leave a message on vm (no mailbox set up) Sent text message requesting call back.

## 2023-03-11 ENCOUNTER — Other Ambulatory Visit: Payer: Self-pay | Admitting: Internal Medicine

## 2023-03-11 ENCOUNTER — Other Ambulatory Visit: Payer: Self-pay | Admitting: Student

## 2023-03-11 DIAGNOSIS — E782 Mixed hyperlipidemia: Secondary | ICD-10-CM

## 2023-03-13 NOTE — Progress Notes (Signed)
[facility] PREP Evaluation  Patient Details  Name: [name] MRN: [id number] Date of Birth: 1961/01/28 Age: 62 y.o. PCP: Chauncey Mann, DO  Vitals:   03/13/23 1100  BP: 118/76  Pulse: 73  SpO2: 99%  Weight: 243 lb 9.6 oz (110.5 kg)     Past Medical History:  Diagnosis Date   Anemia 2009    Baseline hemoglobin at 11, secondary to iron deficiency   Arthritis    Breast mass 02/2008    Noted on mammogram March 11, 2008 3 cm simple cyst at 9 to 10:00 position of the right breast as well as multiple other smaller simple cyst, 2.6 cm mass at the 10:00 position of the right breast also representing complex cyst,   Deep vein thrombosis (DVT) (HCC)     history of multiple  DVTs in 1989, 1989, 2004, on chronic anticoagulation with Coumadin   Diverticulosis    noted colonoscopy 2010   Eczema    Fibroid uterus  2001    status post total abdominal hysterectomy, right and left salpingo-oophorectomy , done by Dr. 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 Clearance Coots   BREAST LUMPECTOMY WITH RADIOACTIVE SEED LOCALIZATION Right 07/09/2017   Procedure: RIGHT BREAST LUMPECTOMY WITH RADIOACTIVE SEED LOCALIZATION;  Surgeon: Harriette Bouillon, MD;  Location: [facility];  Service: General;  Laterality: Right;   CHOLECYSTECTOMY     1997    MASTECTOMY, PARTIAL  03/2006    left partial mastectomy secondary to fibrocystic disease intraluminal fibroadenoma performed March 28, 2006 done by Dr. Zachery Dakins   Social History   Tobacco Use  Smoking Status Former   Types: Cigarettes  Smokeless Tobacco Never    Bonnye Fava 03/13/2023, 12:34 PM

## 2023-03-26 ENCOUNTER — Telehealth: Payer: Self-pay

## 2023-03-26 NOTE — Telephone Encounter (Signed)
Called to pt to check on her for PREP class. Has not been able to attend the 1030a class that started on 7/23 due to emergencies. Can start on 04/09/23 T/TH 1p-215p. Text sent as requested. She will let me know if things change

## 2023-04-01 ENCOUNTER — Ambulatory Visit (INDEPENDENT_AMBULATORY_CARE_PROVIDER_SITE_OTHER): Payer: 59 | Admitting: Licensed Clinical Social Worker

## 2023-04-01 DIAGNOSIS — F32A Depression, unspecified: Secondary | ICD-10-CM

## 2023-04-01 NOTE — BH Specialist Note (Signed)
Integrated Behavioral Health Follow Up In-Person Visit  MRN: [id number] Name: [name]  Number of Integrated Behavioral Health Clinician visits: Additional Visit  Session Start time: 1330   Session End time: 1430  Total time in minutes: 60   Types of Service: General Behavioral Integrated Care (BHI)  Interpretor:No. Interpretor Name and Language: N/A  Subjective: [name] is a 62 y.o. female  Patient was referred by PCP for Depression/ ANxiety. Patient reports the following symptoms/concerns: Patient has lost weight, but aspires to return to the gym. Patient states she will begin her membership on August 20th. Patient states she can feel a negative difference in her arthritis since not going to the gym.   Patient states she is wanting to find a life of her own. Patient express lonliness and not having friends. 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      [facility] referred patient to the [facility]. Patient excited about activities and meeting new people.    Patient discussed planning her 63th birthday. Patient wasn't able to attend her prom due to the death of her mother. [facility] and patient discussed having a prom them titles " [employer]". Patient will work on planning her birthday.  Duration of problem: Over 10 years; Severity of problem: moderate  Objective: Mood: NA and Affect: Appropriate Risk of harm to self or others: No plan to harm self or others  Life Context: Family and Social: Patient has 2 sons and a daughter. Patient has grandchildren. School/Work: n/a Self-Care: n/a Life Changes: n/a  Patient and/or Family's Strengths/Protective Factors: Sense of purpose  Goals Addressed: Patient will:  Reduce symptoms of: anxiety, depression, and obsessions   Increase knowledge and/or ability of: coping skills   Demonstrate ability to: Increase healthy adjustment to current life circumstances  Progress  towards Goals: Ongoing  Interventions: Interventions utilized:  Motivational Interviewing and Mindfulness or Relaxation Training Standardized Assessments completed: PHQ-SADS     02/04/2023   11:04 AM 02/04/2023    9:42 AM 12/06/2022   11:00 AM  PHQ-SADS Last 3 Score only  PHQ Adolescent Score 6 6 11      Patient and/or Family Response: Patient agrees to continue therapy.   Patient Centered Plan: Patient is on the following Treatment Plan(s): Continue therapy every two weeks. Assessment: Patient currently experiencing anxiety, depression and obsessions.   Patient may benefit from Ongoing therapy.  Plan: Follow up with behavioral health clinician on : within 30 days.    [name], MSW, LCSW-A She/Her Behavioral Health Clinician [facility]  Internal Medicine Center Direct [phone]  Fax [phone] Main Office Phone: [phone] [address] [place] [place], [place] [address] Website: [facility] Internal Medicine [facility]  [place], [place]  [place]

## 2023-04-18 NOTE — Progress Notes (Signed)
[facility] PREP Weekly Session  Patient Details  Name: [name] MRN: [id number] Date of Birth: October 07, 1960 Age: 62 y.o. PCP: Chauncey Mann, DO  There were no vitals filed for this visit.   [facility] Weekly seesion - 04/18/23 1500       [facility] "PREP" Location   [facility] "PREP" Location [facility]      Weekly Session   Topic Discussed Goal setting and welcome to the program   Scale of perceived exertion   Classes attended to date 2             Bonnye Fava 04/18/2023, 3:40 PM

## 2023-04-20 ENCOUNTER — Other Ambulatory Visit: Payer: Self-pay | Admitting: Student

## 2023-04-22 ENCOUNTER — Other Ambulatory Visit: Payer: Self-pay | Admitting: Internal Medicine

## 2023-04-24 ENCOUNTER — Ambulatory Visit (INDEPENDENT_AMBULATORY_CARE_PROVIDER_SITE_OTHER): Payer: 59 | Admitting: Licensed Clinical Social Worker

## 2023-04-24 DIAGNOSIS — F32A Depression, unspecified: Secondary | ICD-10-CM

## 2023-04-24 NOTE — Telephone Encounter (Signed)
Discontinued 11/22/22

## 2023-04-24 NOTE — BH Specialist Note (Signed)
Integrated Behavioral Health Follow Up In-Person Visit  MRN: [id number] Name: [name]  Number of Integrated Behavioral Health Clinician visits: Additional Visit  Session Start time: 1330   Session End time: 1430  Total time in minutes: 60   Types of Service: Individual psychotherapy and General Behavioral Integrated Care (BHI)  Interpretor:No. Interpretor Name and Language: N.A  Subjective: [name] is a 62 y.o. female  Patient was referred by [name]  . The patient presents with a desire to cultivate a more independent and fulfilling life, expressing feelings of being overly focused on others at the expense of her own well-being. She articulates a strong aspiration to lose weight and adopt a healthier lifestyle, indicating her intention to utilize the gym as a resource for this goal. The patient plans to engage a friend as an accountability partner, committing to schedule gym appointments and notify her friend upon completion of these sessions. With an upcoming birthday, the patient expresses a wish for a memorable celebration that enhances her enjoyment of life. In response, the Behavioral Health Clinician [facility]) has recommended that she reflect on her life to identify activities that excite her. The patient acknowledges her enjoyment of movies and agrees to prioritize self-care by incorporating regular movie outings into her routine. Additionally, [facility] has suggested implementing a reward system: for each gym visit, the patient will earn a trip to the movies, thereby reinforcing positive behavior while working towards her weight loss and lifestyle goals.  [facility] also recommended patient to balance her own needs with those of others through healthy boundaries, self-care, and self-love. Maintain your identity while nurturing relationships. With practice, you can give attention to loved ones while staying true to yourself.  Objective: Mood: Depressed and Affect:  Appropriate Risk of harm to self or others: No plan to harm self or others    Patient and/or Family's Strengths/Protective Factors: Sense of purpose  Goals Addressed: Patient will:  Reduce symptoms of: anxiety and depression   Increase knowledge and/or ability of: coping skills   Demonstrate ability to: Increase healthy adjustment to current life circumstances  Progress towards Goals: Ongoing  Interventions: Interventions utilized:  Motivational Interviewing and Mindfulness or Relaxation Training Standardized Assessments completed: PHQ-SADS  Patient and/or Family Response: Patient is optimistic and agrees to continue therapy   Assessment: Patient currently experiencing depression and anxiety.   Patient may benefit from CBT and motivational interviewing.  Plan: Follow up with behavioral health clinician on : within 30 days.  [name], MSW, LCSW-A She/Her Behavioral Health Clinician [facility]  Internal Medicine Center Direct Dial:[phone]  Fax [phone] Main Office Phone: [phone] [address] [place] [place], [place] [address] Website: [facility] Internal Medicine [facility]  [place], [place]  [place]

## 2023-04-30 NOTE — Progress Notes (Signed)
[facility] PREP Weekly Session  Patient Details  Name: [name] MRN: [id number] Date of Birth: 12-31-1960 Age: 62 y.o. PCP: Chauncey Mann, DO  Vitals:   04/30/23 1300  Weight: 240 lb (108.9 kg)     [facility] Weekly seesion - 04/30/23 1600       [facility] "PREP" Location   [facility] "PREP" Location [facility]      Weekly Session   Topic Discussed Healthy eating tips    Classes attended to date 4             Bonnye Fava 04/30/2023, 4:32 PM

## 2023-05-02 ENCOUNTER — Ambulatory Visit
Admission: EM | Admit: 2023-05-02 | Discharge: 2023-05-02 | Disposition: A | Discharge status: Home / Self Care | Payer: 59 | Attending: Internal Medicine | Admitting: Internal Medicine

## 2023-05-02 DIAGNOSIS — R051 Acute cough: Secondary | ICD-10-CM | POA: Insufficient documentation

## 2023-05-02 DIAGNOSIS — B349 Viral infection, unspecified: Secondary | ICD-10-CM | POA: Diagnosis not present

## 2023-05-02 DIAGNOSIS — Z1152 Encounter for screening for COVID-19: Secondary | ICD-10-CM | POA: Diagnosis not present

## 2023-05-02 MED ORDER — PROMETHAZINE-DM 6.25-15 MG/5ML PO SYRP
2.5000 mL | ORAL_SOLUTION | Freq: Four times a day (QID) | ORAL | 0 refills | Status: DC | PRN
Start: 2023-05-02 — End: 2023-09-04

## 2023-05-02 NOTE — Discharge Instructions (Signed)
The clinic will contact you with results of the COVID test done today if positive.  May take Promethazine DM as needed for cough.  Please note this medication can make you drowsy.  Do not drink alcohol or drive on this medication.  You may continue Tylenol or over-the-counter ibuprofen as needed.  Lots of rest and fluids.  Please follow-up with your PCP in 2 days for recheck.  Please go to the emergency room if you develop any worsening symptoms.  I hope you feel better soon!

## 2023-05-02 NOTE — ED Provider Notes (Signed)
[facility]    CSN: [id number] Arrival date & time: 05/02/23  1418      History   Chief Complaint Chief Complaint  Patient presents with   Chills   Cough    HPI [name] is a 62 y.o. female  presents for evaluation of URI symptoms for 1 days. Patient reports associated symptoms of cough, chills, headache, sore throat, body aches. Denies N/V/D, fever, ear pain, shortness of breath. Patient does not have a hx of asthma. Patient does not have a history of smoking.  Reports no sick contacts.  Pt has taken Tylenol OTC for symptoms.  Patient would like to get checked for COVID.  Pt has no other concerns at this time.    Cough Associated symptoms: headaches, myalgias and sore throat     Past Medical History:  Diagnosis Date   Anemia 2009    Baseline hemoglobin at 11, secondary to iron deficiency   Arthritis    Breast mass 02/2008    Noted on mammogram March 11, 2008 3 cm simple cyst at 9 to 10:00 position of the right breast as well as multiple other smaller simple cyst, 2.6 cm mass at the 10:00 position of the right breast also representing complex cyst,   Deep vein thrombosis (DVT) (HCC)     history of multiple  DVTs in 1989, 1989, 2004, on chronic anticoagulation with Coumadin   Diverticulosis    noted colonoscopy 2010   Eczema    Fibroid uterus  2001    status post total abdominal hysterectomy, right and left salpingo-oophorectomy , done by Dr. 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    her biopsy results of the left breast excisional biopsy of mass, phyllodes tumeor with physical borderline features, 2.6 cm, margins negative, fibrocystic change  including duct ectasia, fibrosis, apocrine metaplasia and focal calcification    Patient Active Problem List   Diagnosis Date Noted   At risk for obstructive sleep apnea 02/04/2023   Binge eating disorder 12/08/2022   Stroke-like symptoms 11/20/2022   Refusal of blood product 11/20/2022   Class 1 obesity due to excess calories with body mass index (BMI) of 34.0 to 34.9 in adult 11/20/2022   Tinnitus of right ear 06/06/2022   Hearing loss due to cerumen impaction, right 06/06/2022   Migraines 05/01/2022   Headache 04/10/2022   Prediabetes 01/05/2022   Back pain 09/04/2021   Limb cramps 03/30/2021   Shoulder impingement syndrome, right 02/04/2020   Depression 05/16/2018   Neuropathy 01/28/2018   Obesity, morbid (HCC) 09/28/2016   Dyslipidemia 01/27/2014   Bilateral knee pain 05/25/2013   Eczema 05/12/2013   Insomnia 05/12/2013   Diverticulosis 04/18/2012   Healthcare maintenance 02/07/2011   Rash 11/13/2007   Upper airway cough syndrome 12/03/2006   Essential hypertension 05/29/2006   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 Clearance Coots   BREAST LUMPECTOMY WITH RADIOACTIVE SEED LOCALIZATION Right 07/09/2017   Procedure: RIGHT BREAST LUMPECTOMY WITH RADIOACTIVE SEED LOCALIZATION;  Surgeon: Harriette Bouillon, MD;  Location: [facility];  Service: General;  Laterality: Right;   CHOLECYSTECTOMY     1997   MASTECTOMY, PARTIAL  03/2006    left partial mastectomy secondary to fibrocystic disease intraluminal fibroadenoma performed March 28, 2006 done by Dr. Zachery Dakins    OB History   No obstetric history on file.      Home Medications    Prior to Admission medications   Medication Sig Start Date End Date Taking? Authorizing Provider  promethazine-dextromethorphan (PROMETHAZINE-DM) 6.25-15 MG/5ML syrup Take 2.5 mLs by mouth 4 (four) times daily as needed for cough.  05/02/23  Yes Radford Pax, NP  albuterol (VENTOLIN HFA) 108 (90 Base) MCG/ACT inhaler INHALE 1-2 PUFFS BY MOUTH EVERY 6 HOURS AS NEEDED FOR WHEEZE OR SHORTNESS OF BREATH 04/23/23   Atway, Rayann N, DO  Blood Pressure Monitor DEVI Use to check blood pressure once daily 02/04/23   Steffanie Rainwater, MD  cyclobenzaprine (FLEXERIL) 5 MG tablet TAKE 1 TABLET BY MOUTH AT BEDTIME AS NEEDED FOR MUSCLE SPASMS. 01/15/23   Quincy Simmonds, MD  Multiple Vitamin (MULTIVITAMIN) capsule Take 1 capsule daily by mouth.    [provider]  rosuvastatin (CRESTOR) 20 MG tablet TAKE 1 TABLET BY MOUTH EVERY DAY 01/15/23   Quincy Simmonds, MD  Semaglutide, 1 MG/DOSE, (OZEMPIC, 1 MG/DOSE,) 4 MG/3ML SOPN INJECT 2 MG INTO THE SKIN ONCE A WEEK. 03/14/23   Chauncey Mann, DO    Family History Family History  Problem Relation Age of Onset   Gout Father    Stroke Other    Cancer Other    Clotting disorder Other     Social History Social History   Tobacco Use   Smoking status: Former    Types: Cigarettes   Smokeless tobacco: Never  Vaping Use   Vaping status: Never Used  Substance Use Topics   Alcohol use: Yes    Comment: rare   Drug use: No     Allergies   Dilaudid [hydromorphone hcl]   Review of Systems Review of Systems  HENT:  Positive for congestion and sore throat.   Respiratory:  Positive for cough.   Musculoskeletal:  Positive for myalgias.  Neurological:  Positive for headaches.     Physical Exam Triage Vital Signs ED Triage Vitals  Encounter Vitals Group     BP 05/02/23 1501 (!) 141/86     Systolic BP Percentile --      Diastolic BP Percentile --      Pulse Rate 05/02/23 1501 73     Resp 05/02/23 1501 18     Temp 05/02/23 1501 98.5 F (36.9 C)     Temp Source 05/02/23 1501 Oral     SpO2 05/02/23 1501 96 %     Weight --      Height --      Head Circumference --      Peak Flow --      Pain Score 05/02/23 1504 0     Pain Loc --      Pain Education --      Exclude from  Growth Chart --    No data found.  Updated Vital Signs BP (!) 141/86 (BP Location: Right Arm)   Pulse 73   Temp 98.5 F (36.9 C) (Oral)   Resp 18   SpO2 96%   Visual Acuity Right Eye Distance:   Left Eye Distance:   Bilateral Distance:    Right Eye Near:  Left Eye Near:    Bilateral Near:     Physical Exam Vitals and nursing note reviewed.  Constitutional:      General: She is not in acute distress.    Appearance: She is well-developed. She is not ill-appearing.  HENT:     Head: Normocephalic and atraumatic.     Right Ear: Tympanic membrane and ear canal normal.     Left Ear: Tympanic membrane and ear canal normal.     Nose: Congestion present.     Mouth/Throat:     Mouth: Mucous membranes are moist.     Pharynx: Oropharynx is clear. Uvula midline. Posterior oropharyngeal erythema present.     Tonsils: No tonsillar exudate or tonsillar abscesses.  Eyes:     Conjunctiva/sclera: Conjunctivae normal.     Pupils: Pupils are equal, round, and reactive to light.  Cardiovascular:     Rate and Rhythm: Normal rate and regular rhythm.     Heart sounds: Normal heart sounds.  Pulmonary:     Effort: Pulmonary effort is normal.     Breath sounds: Normal breath sounds.  Musculoskeletal:     Cervical back: Normal range of motion and neck supple.  Lymphadenopathy:     Cervical: No cervical adenopathy.  Skin:    General: Skin is warm and dry.  Neurological:     General: No focal deficit present.     Mental Status: She is alert and oriented to person, place, and time.  Psychiatric:        Mood and Affect: Mood normal.        Behavior: Behavior normal.      UC Treatments / Results  Labs (all labs ordered are listed, but only abnormal results are displayed) Labs Reviewed  SARS CORONAVIRUS 2 (TAT 6-24 HRS)    EKG   Radiology No results found.  Procedures Procedures (including critical care time)  Medications Ordered in UC Medications - No data to  display  Initial Impression / Assessment and Plan / UC Course  I have reviewed the triage vital signs and the nursing notes.  Pertinent labs & imaging results that were available during my care of the patient were reviewed by me and considered in my medical decision making (see chart for details).     Reviewed exam and symptoms with patient.  No red flags.  COVID PCR and will contact if positive.  Discussed viral illness and symptomatic treatment.  Promethazine DM as needed for cough.  Side effect profile reviewed.  PCP follow-up 2 days for recheck.  ER precautions reviewed and patient verbalized understanding. Final Clinical Impressions(s) / UC Diagnoses   Final diagnoses:  Acute cough  Viral illness     Discharge Instructions      The clinic will contact you with results of the COVID test done today if positive.  May take Promethazine DM as needed for cough.  Please note this medication can make you drowsy.  Do not drink alcohol or drive on this medication.  You may continue Tylenol or over-the-counter ibuprofen as needed.  Lots of rest and fluids.  Please follow-up with your PCP in 2 days for recheck.  Please go to the emergency room if you develop any worsening symptoms.  I hope you feel better soon!   ED Prescriptions     Medication Sig Dispense Auth. Provider   promethazine-dextromethorphan (PROMETHAZINE-DM) 6.25-15 MG/5ML syrup Take 2.5 mLs by mouth 4 (four) times daily as needed for cough. 118 mL Radford Pax, NP  PDMP not reviewed this encounter.   Radford Pax, NP 05/02/23 1536

## 2023-05-02 NOTE — ED Triage Notes (Signed)
Pt reports chills ad cough x 1  day.

## 2023-05-03 LAB — SARS CORONAVIRUS 2 (TAT 6-24 HRS): SARS Coronavirus 2: NEGATIVE

## 2023-05-09 ENCOUNTER — Ambulatory Visit: Payer: 59 | Admitting: Licensed Clinical Social Worker

## 2023-06-17 ENCOUNTER — Telehealth: Payer: Self-pay | Admitting: Student

## 2023-06-17 NOTE — Telephone Encounter (Signed)
There should be 1 more refill. I called [facility] - stated they are working on it. Pt called and informed.

## 2023-06-17 NOTE — Telephone Encounter (Signed)
Pt states she should be getting the 2mg  of the following medication refilled.   Semaglutide, 1 MG/DOSE, (OZEMPIC, 1 MG/DOSE,) 4 MG/3ML SOPN     [id number] - [place], [place] - [address]

## 2023-06-18 NOTE — Progress Notes (Signed)
[facility] PREP Weekly Session  Patient Details  Name: [name] MRN: [id number] Date of Birth: 06-10-1961 Age: 62 y.o. PCP: Kathleen Lime, MD  Vitals:   06/18/23 1501  Weight: 235 lb (106.6 kg)     [facility] Weekly seesion - 06/18/23 1500       [facility] "PREP" Location   [facility] "PREP" Location [facility]      Weekly Session   Topic Discussed Finding support    Minutes exercised this week 30 minutes   walking 3 min most days   Classes attended to date 8             Pam Jerral Bonito 06/18/2023, 3:03 PM

## 2023-07-10 ENCOUNTER — Other Ambulatory Visit: Payer: Self-pay | Admitting: Student

## 2023-07-10 NOTE — Telephone Encounter (Signed)
Next appt scheduled 11/21 with PCP. 

## 2023-07-11 ENCOUNTER — Ambulatory Visit: Payer: 59 | Admitting: Licensed Clinical Social Worker

## 2023-07-11 ENCOUNTER — Other Ambulatory Visit: Payer: Self-pay

## 2023-07-11 ENCOUNTER — Encounter: Payer: 59 | Admitting: Student

## 2023-07-11 MED ORDER — OZEMPIC (1 MG/DOSE) 4 MG/3ML ~~LOC~~ SOPN: 2.0000 mg | PEN_INJECTOR | SUBCUTANEOUS | 3 refills | Status: DC

## 2023-07-15 ENCOUNTER — Ambulatory Visit (INDEPENDENT_AMBULATORY_CARE_PROVIDER_SITE_OTHER): Payer: 59 | Admitting: Licensed Clinical Social Worker

## 2023-07-15 DIAGNOSIS — F50819 Binge eating disorder, unspecified: Secondary | ICD-10-CM

## 2023-07-15 DIAGNOSIS — F32A Depression, unspecified: Secondary | ICD-10-CM | POA: Diagnosis not present

## 2023-07-15 NOTE — BH Specialist Note (Signed)
Integrated Behavioral Health via Telemedicine Visit  07/15/2023 [facility]YKIR[facility]NT [id number]  Number of Integrated Behavioral Health Clinician visits: Additional Visit  Session Start time: 1523   Session End time: 1623  Total time in minutes: 60   Referring Provider: PCP Patient/Family location: Home [facility] Provider location: Office All persons participating in visit: Patient and [facility] Types of Service: Individual psychotherapy  I connected with Izetta Dakin  via  Telephone and verified that I am speaking with the correct person using two identifiers. Discussed confidentiality: Yes   I discussed the limitations of telemedicine and the availability of in person appointments.  Discussed there is a possibility of technology failure and discussed alternative modes of communication if that failure occurs.  I discussed that engaging in this telemedicine visit, they consent to the provision of behavioral healthcare and the services will be billed under their insurance.  Patient and/or legal guardian expressed understanding and consented to Telemedicine visit: Yes   Presenting Concerns: Patient and/or family reports the following symptoms/concerns: On July 15, 2023, the patient participated in a telehealth session via telephone with [name], LCSW-A. During this 60-minute session, the patient shared that they had recently gone to the movies and were actively trying to improve their mood and mental state. They reported a commitment to returning to the gym, where they have been exercising for an hour each day for the past two weeks. This effort reflects the patient's desire to break free from a persistent rut and engage in healthier habits. The patient expressed significant feelings of extreme depression that have intensified since the election of Garnet Koyanagi as president. This political event appears to have had a profound impact on their emotional well-being, contributing to feelings  of sadness and frustration. Throughout the session, Marena Chancy and the patient focused on processing these emotions, allowing the patient to articulate their thoughts and feelings about both personal and societal issues affecting their mental health. Interventions during the session included emotional processing, where Marena Chancy engaged in dialogue to help the patient explore their feelings related to recent events. Goal setting was also a key component of the discussion, as they talked about strategies for maintaining motivation in physical fitness as a means to combat depressive symptoms. Additionally, supportive counseling provided a safe space for the patient to express concerns about societal issues and their effects on mental health. The plan moving forward includes continuing weekly telehealth sessions to monitor emotional progress and reinforce coping strategies. Marena Chancy encouraged the patient to maintain participation in physical activities and social outings to enhance their mood. Furthermore, they will explore additional coping mechanisms for managing feelings related to political events and personal circumstances.   Patient and/or Family's Strengths/Protective Factors: Social connections  Goals Addressed: Patient will:  Reduce symptoms of: mood instability   Increase knowledge and/or ability of: coping skills   Demonstrate ability to: Increase healthy adjustment to current life circumstances  Progress towards Goals: Ongoing  Interventions: Interventions utilized:  CBT Cognitive Behavioral Therapy Standardized Assessments completed: PHQ-SADS     02/04/2023   11:04 AM 02/04/2023    9:42 AM 12/06/2022   11:00 AM  PHQ-SADS Last 3 Score only  PHQ Adolescent Score 6 6 11      Patient and/or Family Response: Patient agrees to services  Assessment: Patient currently experiencing Depression and emotional eating.   Patient may benefit from ongoing therapy.  Plan: Follow up with behavioral  health clinician on : within 30 days  I discussed the assessment and treatment plan with  the patient and/or parent/guardian. They were provided an opportunity to ask questions and all were answered. They agreed with the plan and demonstrated an understanding of the instructions.   They were advised to call back or seek an in-person evaluation if the symptoms worsen or if the condition fails to improve as anticipated.  [name], MSW, LCSW-A She/Her Behavioral Health Clinician [facility] Direct [phone]  Fax [phone] Main Office Phone: [phone] [address] [place] [place], [place] [address] Website: [facility]  [place], [place]  [place]

## 2023-07-22 ENCOUNTER — Telehealth: Payer: Self-pay

## 2023-07-22 MED ORDER — OZEMPIC (2 MG/DOSE) 8 MG/3ML ~~LOC~~ SOPN: 2.0000 mg | PEN_INJECTOR | SUBCUTANEOUS | 0 refills | Status: DC

## 2023-07-22 NOTE — Telephone Encounter (Signed)
Patient called she stated she received a rx for semaglutide 1 mg, patient stated she is supposed to be taking 2 mg. Please address.

## 2023-07-25 NOTE — Progress Notes (Signed)
PREP start: 04/16/23 ended 07/23/23 Attended 10 of 24 sessions, 3 educational sessions Did not return for final measurements and fit testing.

## 2023-07-29 ENCOUNTER — Ambulatory Visit (INDEPENDENT_AMBULATORY_CARE_PROVIDER_SITE_OTHER): Payer: 59 | Admitting: Internal Medicine

## 2023-07-29 VITALS — BP 145/91 | HR 70 | Temp 98.1°F | Ht 71.0 in | Wt 251.7 lb

## 2023-07-29 DIAGNOSIS — I10 Essential (primary) hypertension: Secondary | ICD-10-CM | POA: Diagnosis not present

## 2023-07-29 DIAGNOSIS — R7303 Prediabetes: Secondary | ICD-10-CM | POA: Diagnosis not present

## 2023-07-29 DIAGNOSIS — Z1231 Encounter for screening mammogram for malignant neoplasm of breast: Secondary | ICD-10-CM

## 2023-07-29 DIAGNOSIS — Z6835 Body mass index (BMI) 35.0-35.9, adult: Secondary | ICD-10-CM | POA: Diagnosis not present

## 2023-07-29 DIAGNOSIS — Z0000 Encounter for general adult medical examination without abnormal findings: Secondary | ICD-10-CM

## 2023-07-29 LAB — POCT GLYCOSYLATED HEMOGLOBIN (HGB A1C): Hemoglobin A1C: 5.5 % (ref 4.0–5.6)

## 2023-07-29 LAB — GLUCOSE, CAPILLARY: Glucose-Capillary: 77 mg/dL (ref 70–99)

## 2023-07-29 MED ORDER — OZEMPIC (2 MG/DOSE) 8 MG/3ML ~~LOC~~ SOPN: 2.0000 mg | PEN_INJECTOR | SUBCUTANEOUS | 1 refills | Status: DC

## 2023-07-29 NOTE — Assessment & Plan Note (Addendum)
-   Patient got flu shot 2 weeks ago - Order for screening mammo placed

## 2023-07-29 NOTE — Assessment & Plan Note (Signed)
The patient has been on Ozempic 2 mg/week for her prediabetes and for weight loss. She has gained 15 lbs since her last office visit and the patient is very upset by this. She states that she has been under a lot of stress and attributes her weight gain to this, and not being as strict with her diet anymore. I counseled the patient on lifestyle modifications, specifically advising that if she can go back to calorie counting and cut out 500 cals/day, she can lose about 1 lb/week. Will also continue Ozempic in the meantime.

## 2023-07-29 NOTE — Progress Notes (Signed)
CC: HTN and DM  HPI:  [name] is a 62 y.o. female living with a history stated below and presents today for a follow up of her HTN and prediabetes. Please see problem based assessment and plan for additional details.  Past Medical History:  Diagnosis Date   Anemia 2009    Baseline hemoglobin at 11, secondary to iron deficiency   Arthritis    Breast mass 02/2008    Noted on mammogram March 11, 2008 3 cm simple cyst at 9 to 10:00 position of the right breast as well as multiple other smaller simple cyst, 2.6 cm mass at the 10:00 position of the right breast also representing complex cyst,   Deep vein thrombosis (DVT) (HCC)     history of multiple  DVTs in 1989, 1989, 2004, on chronic anticoagulation with Coumadin   Diverticulosis    noted colonoscopy 2010   Eczema    Fibroid uterus  2001    status post total abdominal hysterectomy, right and left salpingo-oophorectomy , done by Dr. 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    her biopsy results of the left breast excisional biopsy of mass, phyllodes tumeor with physical borderline features, 2.6 cm, margins negative, fibrocystic change including duct ectasia, fibrosis, apocrine metaplasia and focal calcification    Current Outpatient Medications on File Prior to Visit  Medication Sig Dispense Refill   albuterol (VENTOLIN HFA) 108 (90 Base) MCG/ACT inhaler INHALE 1-2 PUFFS BY MOUTH EVERY 6 HOURS AS NEEDED FOR WHEEZE OR SHORTNESS OF BREATH 8.5 each 1   Blood Pressure Monitor DEVI Use to check blood pressure once daily 1 each 0   cyclobenzaprine (FLEXERIL) 5 MG tablet TAKE 1 TABLET BY MOUTH AT BEDTIME AS NEEDED FOR  MUSCLE SPASMS. 30 tablet 0   Multiple Vitamin (MULTIVITAMIN) capsule Take 1 capsule daily by mouth.     promethazine-dextromethorphan (PROMETHAZINE-DM) 6.25-15 MG/5ML syrup Take 2.5 mLs by mouth 4 (four) times daily as needed for cough. 118 mL 0   rosuvastatin (CRESTOR) 20 MG tablet TAKE 1 TABLET BY MOUTH EVERY DAY 90 tablet 3   No current facility-administered medications on file prior to visit.    Family History  Problem Relation Age of Onset   Gout Father    Stroke Other    Cancer Other    Clotting disorder Other     Social History   Socioeconomic History   Marital status: Married    Spouse name: Not on file   Number of children: Not on file   Years of education: Not on file   Highest education level: Not on file  Occupational History   Occupation: unemployed  Tobacco Use   Smoking status: Former    Types: Cigarettes   Smokeless tobacco: Never  Vaping Use   Vaping status: Never Used  Substance and Sexual Activity   Alcohol use: Yes    Comment: rare   Drug use: No   Sexual activity: Not on file  Other Topics Concern   Not on file  Social History Narrative   Not on file   Social  Determinants of Health   Financial Resource Strain: High Risk (02/04/2023)   Overall Financial Resource Strain (CARDIA)    Difficulty of Paying Living Expenses: Hard  Food Insecurity: Food Insecurity Present (02/04/2023)   Hunger Vital Sign    Worried About Running Out of Food in the Last Year: Often true    Ran Out of Food in the Last Year: Sometimes true  Transportation Needs: Unmet Transportation Needs (02/04/2023)   PRAPARE - Administrator, Civil Service (Medical): Yes    Lack of Transportation (Non-Medical): Yes  Physical Activity: Insufficiently Active (02/04/2023)   Exercise Vital Sign    Days of Exercise per Week: 2 days    Minutes of Exercise per Session: 30 min  Stress: Stress Concern Present (02/04/2023)   [employer] of Occupational Health - Occupational  Stress Questionnaire    Feeling of Stress : Very much  Social Connections: Moderately Integrated (02/04/2023)   Social Connection and Isolation Panel [NHANES]    Frequency of Communication with Friends and Family: More than three times a week    Frequency of Social Gatherings with Friends and Family: Never    Attends Religious Services: More than 4 times per year    Active Member of [employer] or Organizations: Yes    Attends Banker Meetings: 1 to 4 times per year    Marital Status: Divorced  Catering manager Violence: Not At Risk (02/04/2023)   Humiliation, Afraid, Rape, and Kick questionnaire    Fear of Current or Ex-Partner: No    Emotionally Abused: No    Physically Abused: No    Sexually Abused: No    Review of Systems: ROS negative except for what is noted on the assessment and plan.  Vitals:   07/29/23 0930 07/29/23 0951  BP: (!) 159/88 (!) 145/91  Pulse: 68 70  Temp: 98.1 F (36.7 C)   TempSrc: Oral   SpO2: 100%   Weight: 251 lb 11.2 oz (114.2 kg)   Height: 5\' 11"  (1.803 m)     Physical Exam: Constitutional: well-appearing, obese female Cardiovascular: regular rate and rhythm, no m/r/g Pulmonary/Chest: normal work of breathing on room air MSK: normal bulk and tone Neurological: alert & oriented x 3, no focal deficit Skin: warm and dry Psych: normal mood and behavior  Assessment & Plan:    Patient discussed with Dr. Sol Blazing  Essential hypertension BP had previously been well controlled off of antihypertensives, however, today it is elevated to 159/88 and 145/91 upon recheck. She denies any headaches, vision changes, chest pain, or SOB. I advised the patient to start an antihypertensive, such as an ARB, given her history of prediabetes, however, she declined and would like to see if weight loss can help to control her BP.  Plan: - Continue to monitor BP - Patient declined ARB therapy - Follow up for BP recheck in ~3 months  Prediabetes A1c improved  from 5.7% to 5.5% on ozempic 2 mg/week. See obesity A&P for further details.   Obesity, morbid (HCC) The patient has been on Ozempic 2 mg/week for her prediabetes and for weight loss. She has gained 15 lbs since her last office visit and the patient is very upset by this. She states that she has been under a lot of stress and attributes her weight gain to this, and not being as strict with her diet anymore. I counseled the patient on lifestyle modifications, specifically advising that if she can go back to calorie counting and cut out 500  cals/day, she can lose about 1 lb/week. Will also continue Ozempic in the meantime.  Healthcare maintenance - Patient got flu shot 2 weeks ago - Order for screening mammo placed   Oakley Kossman, D.O. [facility], PGY-3 Phone: [phone] Date 07/29/2023 Time 1:07 PM

## 2023-07-29 NOTE — Assessment & Plan Note (Signed)
A1c improved from 5.7% to 5.5% on ozempic 2 mg/week. See obesity A&P for further details.

## 2023-07-29 NOTE — Assessment & Plan Note (Addendum)
BP had previously been well controlled off of antihypertensives, however, today it is elevated to 159/88 and 145/91 upon recheck. She denies any headaches, vision changes, chest pain, or SOB. I advised the patient to start an antihypertensive, such as an ARB, given her history of prediabetes, however, she declined and would like to see if weight loss can help to control her BP.  Plan: - Continue to monitor BP - Patient declined ARB therapy - Follow up for BP recheck in ~3 months

## 2023-07-29 NOTE — Patient Instructions (Signed)
Thank you, Ms.Izetta Dakin for allowing [place] to provide your care today. Today we discussed:  Prediabetes/weight: Your pre-diabetes is under good control, although you have gained weight since your last visit Remember: cutting out 500 calories per day can result in losing 1 lb per week Call to schedule your mammogram I recommend you get back on a blood pressure medicine given how high your blood pressure is today  Call us if you are willing to start this  Check BP at home and keep a log of this  I have ordered the following labs for you:   Lab Orders         Glucose, capillary         POC Hbg A1C       Referrals ordered today:   Referral Orders  No referral(s) requested today     I have ordered the following medication/changed the following medications:   Stop the following medications: Medications Discontinued During This Encounter  Medication Reason   Semaglutide, 2 MG/DOSE, (OZEMPIC, 2 MG/DOSE,) 8 MG/3ML SOPN Reorder     Start the following medications: Meds ordered this encounter  Medications   Semaglutide, 2 MG/DOSE, (OZEMPIC, 2 MG/DOSE,) 8 MG/3ML SOPN    Sig: Inject 2 mg into the skin once a week.    Dispense:  9 mL    Refill:  1     Follow up: 2-3 months    Should you have any questions or concerns please call the internal medicine clinic at [phone].     Elza Rafter, D.O. [facility] Internal Medicine Center

## 2023-07-31 NOTE — Progress Notes (Signed)
Internal Medicine Clinic Attending ° °Case discussed with Dr. Atway  At the time of the visit.  We reviewed the resident’s history and exam and pertinent patient test results.  I agree with the assessment, diagnosis, and plan of care documented in the resident’s note.  °

## 2023-08-05 ENCOUNTER — Other Ambulatory Visit: Payer: Self-pay | Admitting: Student

## 2023-08-05 ENCOUNTER — Telehealth: Payer: Self-pay | Admitting: Student

## 2023-08-05 DIAGNOSIS — N6310 Unspecified lump in the right breast, unspecified quadrant: Secondary | ICD-10-CM

## 2023-08-05 DIAGNOSIS — Z1231 Encounter for screening mammogram for malignant neoplasm of breast: Secondary | ICD-10-CM

## 2023-08-05 NOTE — Telephone Encounter (Signed)
Rec'd a call from the pt. She is unable to sch her Yearly mammo.  Per [facility] the following order will need to be placed as the current order is not correct.  "07/29/23 - pt needs follow up diagnostic + ultrasound, vm not set up - LS"   Can this patient have a new order placed?

## 2023-08-11 ENCOUNTER — Other Ambulatory Visit: Payer: Self-pay | Admitting: Student

## 2023-08-20 ENCOUNTER — Other Ambulatory Visit: Payer: Self-pay | Admitting: Student

## 2023-08-20 DIAGNOSIS — Z1231 Encounter for screening mammogram for malignant neoplasm of breast: Secondary | ICD-10-CM

## 2023-08-20 DIAGNOSIS — N6310 Unspecified lump in the right breast, unspecified quadrant: Secondary | ICD-10-CM

## 2023-08-26 ENCOUNTER — Ambulatory Visit (INDEPENDENT_AMBULATORY_CARE_PROVIDER_SITE_OTHER): Payer: 59 | Admitting: Licensed Clinical Social Worker

## 2023-08-26 DIAGNOSIS — F411 Generalized anxiety disorder: Secondary | ICD-10-CM | POA: Diagnosis not present

## 2023-08-26 DIAGNOSIS — Z62820 Parent-biological child conflict: Secondary | ICD-10-CM

## 2023-08-26 DIAGNOSIS — F32A Depression, unspecified: Secondary | ICD-10-CM

## 2023-08-26 NOTE — BH Specialist Note (Signed)
 Integrated Behavioral Health Follow Up In-Person Visit  MRN: [id number] Name: [name]  Number of Integrated Behavioral Health Clinician visits: Additional Visit  Session Start time: 1336   Session End time: 1436  Total time in minutes: 60   Types of Service: Individual psychotherapy  Interpretor:No. Interpretor Name and Language: N/A  Subjective: [name] is a 63 y.o. female  Patient was referred by PCP for Eating disorder. Patient reports the following symptoms/concerns: LCSW-A and [facility] addressed the patient's concerns regarding anxiety, sleep issues, and conflicts with their live-in daughter. The patient reported experiencing anxiety and difficulty sleeping, which were exacerbated by the living situation. To help manage these issues, the [facility] and patient reviewed several coping skills for anxiety. These included deep breathing exercises, progressive muscle relaxation, and mindfulness techniques to stay present and reduce worry. They also discussed the importance of maintaining a healthy lifestyle, including regular exercise and proper sleep hygiene. For conflict resolution with the daughter, they explored effective communication strategies, setting boundaries, and practicing active listening. [facility] emphasized the value of structured problem-solving approaches to address specific issues that arise in their shared living space. Additionally, they touched on the benefits of journaling as a way to process emotions and identify anxiety triggers. The patient was encouraged to implement these strategies and report back on their effectiveness in future sessions.   Objective: Mood: NA and Affect: Appropriate Risk of harm to self or others: No plan to harm self or others   Patient and/or Family's Strengths/Protective Factors: Sense of purpose  Goals Addressed: Patient will:  Reduce symptoms of: depression    Progress towards  Goals: Ongoing  Interventions: Interventions utilized:  CBT Cognitive Behavioral Therapy Standardized Assessments completed: Not Needed  Patient and/or Family Response: Patient agreed to ongoing services  Patient Centered Plan: Patient is on the following Treatment Plan(s): OPT Assessment: Patient currently experiencing Relationship conflict and depression.   Patient may benefit from OPT.  Plan: Follow up with behavioral health clinician on : Within the next 60 days   Renda Pontes, MSW, LCSW-A She/Her Behavioral Health Clinician [facility]  Internal Medicine Center Direct Dial:[phone]  Fax [phone] Main Office Phone: [phone] [address] [place] [place], [place] [address] Website: [facility] Internal Medicine [facility]  [place], [place]  [facility]

## 2023-08-27 ENCOUNTER — Ambulatory Visit
Admission: RE | Admit: 2023-08-27 | Discharge: 2023-08-27 | Disposition: A | Discharge status: Home / Self Care | Payer: 59 | Source: Ambulatory Visit | Attending: Internal Medicine | Admitting: Internal Medicine

## 2023-08-27 DIAGNOSIS — N6310 Unspecified lump in the right breast, unspecified quadrant: Secondary | ICD-10-CM

## 2023-08-27 DIAGNOSIS — N6315 Unspecified lump in the right breast, overlapping quadrants: Secondary | ICD-10-CM | POA: Diagnosis not present

## 2023-08-27 DIAGNOSIS — Z1231 Encounter for screening mammogram for malignant neoplasm of breast: Secondary | ICD-10-CM

## 2023-09-04 ENCOUNTER — Encounter: Payer: Self-pay | Admitting: Student

## 2023-09-04 ENCOUNTER — Ambulatory Visit: Payer: 59 | Admitting: Student

## 2023-09-04 DIAGNOSIS — Z7985 Long-term (current) use of injectable non-insulin antidiabetic drugs: Secondary | ICD-10-CM | POA: Diagnosis not present

## 2023-09-04 DIAGNOSIS — Z6835 Body mass index (BMI) 35.0-35.9, adult: Secondary | ICD-10-CM | POA: Diagnosis not present

## 2023-09-04 MED ORDER — MOUNJARO 2.5 MG/0.5ML ~~LOC~~ SOAJ: 2.5000 mg | SUBCUTANEOUS | 0 refills | Status: DC

## 2023-09-04 NOTE — Assessment & Plan Note (Signed)
 Patient has been on Ozempic  2 mg/week for about two years for prediabetes and weight loss. She had initial nausea and abdominal discomfort but has no other side effects now. She states she has been taking it consistently and has had a recent weight increase, feels like she is hitting a wall with weight loss goal. About a month ago patient weighed 236 lb, today she weighs 249 lbs. Goal is 229 lbs. Current BMI 35.80 mg. Patient endorses balanced diet and weekly exercise. Patient interested in switching to Mounjaro. Discussed risks, benefits, and side effect profile and patient agreeable to moving forward with the switch. Last dose of Ozempic  2 mg was last Monday (1/06), skipped this week's dose as she wanted to be switched over to Mounjaro. Discussed starting at Mounjaro 2.5 mg injection weekly due to difference in mechanism of action. Patient will try to pick up medication today and will resume taking it every Monday. She understands she will need to come back to the clinic in 1 month to discuss how things are going and to determine if a medication adjustment/medication titration is appropriate at that time.  Plan - STOP Ozempic  2 mg weekly injections - START Mounjaro 2.5 mg weekly injection next Monday 1/20 - 1 month follow up in clinic for dose adjustment as needed

## 2023-09-04 NOTE — Progress Notes (Signed)
 Established Patient Office Visit  Subjective   Patient ID: [name], female    DOB: Apr 08, 1961  Age: 63 y.o. MRN: [id number]  Chief Complaint  Patient presents with   Follow-up    WANTING TO  CHANGE MEDICATION    Patient is a 63 y.o. with a past medical history stated below who presents today for medication management. Please see problem based assessment and plan for additional details.      Past Medical History:  Diagnosis Date   [name]    Baseline hemoglobin at 11, secondary to iron deficiency   Arthritis    Breast mass 07/[name]    Noted on mammogram July 23, [name] 3 cm simple cyst at 9 to 10:00 position of the right breast as well as multiple other smaller simple cyst, 2.6 cm mass at the 10:00 position of the right breast also representing complex cyst,   Deep vein thrombosis (DVT) (HCC)     history of multiple  DVTs in 1989, 1989, 2004, on chronic anticoagulation with Coumadin    Diverticulosis    noted colonoscopy 2010   Eczema    Fibroid uterus  2001    status post total abdominal hysterectomy, right and left salpingo-oophorectomy , done by Dr. April [name]   Heart murmur    as a child   Hypertension    PE (pulmonary embolism)     history of multiple PEs in 1984, 1989, 2004-  electronic data reviewed in [place] and EMR and I was not able to find single CT angiogram that was positive for pulmonary embolus nor was I able to find Doppler studies that were positive for DVTs   [name]  August 2007    her biopsy results of the left breast excisional biopsy of mass, Tammy tumeor with physical borderline features, 2.6 cm, margins negative, fibrocystic change including duct ectasia, fibrosis, apocrine metaplasia and focal calcification      Review of Systems  Gastrointestinal:  Negative for abdominal pain, diarrhea, nausea and vomiting.  Genitourinary:  Negative for dysuria.      Objective:     BP 130/77 (BP Location: Left Arm, Patient Position:  Sitting, Cuff Size: Large)   Pulse 71   Temp 98 F (36.7 C) (Oral)   Ht 5\' 10"  (1.778 m)   Wt 249 lb 8 oz (113.2 kg)   SpO2 100%   BMI 35.80 kg/m  BP Readings from Last 3 Encounters:  09/04/23 130/77  07/29/23 (!) 145/91  05/02/23 (!) 141/86   Wt Readings from Last 3 Encounters:  09/04/23 249 lb 8 oz (113.2 kg)  07/29/23 251 lb 11.2 oz (114.2 kg)  06/18/23 235 lb (106.6 kg)   Physical Exam HENT:     Head: Normocephalic and atraumatic.     Nose: Nose normal.     Mouth/Throat:     Mouth: Mucous membranes are moist.  Eyes:     Extraocular Movements: Extraocular movements intact.     Pupils: Pupils are equal, round, and reactive to light.  Cardiovascular:     Rate and Rhythm: Normal rate and regular rhythm.     Pulses: Normal pulses.  Pulmonary:     Effort: Pulmonary effort is normal.     Breath sounds: Normal breath sounds.  Abdominal:     Palpations: Abdomen is soft.     Tenderness: There is no abdominal tenderness.  Musculoskeletal:     Comments: Moving all limbs spontaneously  Skin:    General: Skin is warm and dry.  Neurological:     General: No focal deficit present.     Mental Status: She is alert.  Psychiatric:        Mood and Affect: Mood normal.        Behavior: Behavior normal.    No results found for any visits on 09/04/23.  Last hemoglobin A1c Lab Results  Component Value Date   HGBA1C 5.5 07/29/2023    The 10-year ASCVD risk score (Arnett DK, et al., 2019) is: 13.9%    Assessment & Plan:   Problem List Items Addressed This Visit     Obesity, morbid (HCC) - Primary   Patient has been on Ozempic  2 mg/week for about two years for prediabetes and weight loss. She had initial nausea and abdominal discomfort but has no other side effects now. She states she has been taking it consistently and has had a recent weight increase, feels like she is hitting a wall with weight loss goal. About a month ago patient weighed 236 lb, today she weighs 249 lbs.  Goal is 229 lbs. Current BMI 35.80 mg. Patient endorses balanced diet and weekly exercise. Patient interested in switching to Mounjaro. Discussed risks, benefits, and side effect profile and patient agreeable to moving forward with the switch. Last dose of Ozempic  2 mg was last Monday (1/06), skipped this week's dose as she wanted to be switched over to Mounjaro. Discussed starting at Mounjaro 2.5 mg injection weekly due to difference in mechanism of action. Patient will try to pick up medication today and will resume taking it every Monday. She understands she will need to come back to the clinic in 1 month to discuss how things are going and to determine if a medication adjustment/medication titration is appropriate at that time.  Plan - STOP Ozempic  2 mg weekly injections - START Mounjaro 2.5 mg weekly injection next Monday 1/20 - 1 month follow up in clinic for dose adjustment as needed      Relevant Medications   tirzepatide (MOUNJARO) 2.5 MG/0.5ML Pen   Patient discussed with Dr. Ancil Balzarine.   Return in about 4 weeks (around 10/02/2023) for Mounjaro medication check/dose adjustment.    Tammy Desir Arellano Zameza, MD

## 2023-09-04 NOTE — Patient Instructions (Addendum)
 Thank you, Ms.Marnee Sink for allowing us  to provide your care today. Today we discussed switching you from Ozempic  to Mounjaro.  I have ordered the following labs for you:  Lab Orders  No laboratory test(s) ordered today     Tests ordered today:  None  Referrals ordered today:   Referral Orders  No referral(s) requested today     I have ordered the following medication/changed the following medications:   Stop the following medications: Medications Discontinued During This Encounter  Medication Reason   promethazine -dextromethorphan (PROMETHAZINE -DM) 6.25-15 MG/5ML syrup Patient has not taken in last 30 days   Semaglutide , 2 MG/DOSE, (OZEMPIC , 2 MG/DOSE,) 8 MG/3ML SOPN Change in therapy     Start the following medications: Meds ordered this encounter  Medications   tirzepatide (MOUNJARO) 2.5 MG/0.5ML Pen    Sig: Inject 2.5 mg into the skin once a week.    Dispense:  2 mL    Refill:  0     Follow up: 1 month   Remember:   - You may start taking Mounjaro 2.5 mg injection weekly the day you get it from the pharmacy, or if Mondays are easier to remember start next Monday.  - STOP Ozempic  injections once you have Mounjaro confirmed by the pharmacy.  - We will see you in 1 month to help determine how things are going.   Should you have any questions or concerns please call the internal medicine clinic at [phone].    Ines Warf Arellano Zameza, MD PGY-1 Internal Medicine Teaching Progam [facility] Internal Medicine Center

## 2023-09-05 NOTE — Progress Notes (Signed)
Internal Medicine Clinic Attending  Case discussed with the resident at the time of the visit.  We reviewed the resident's history and exam and pertinent patient test results.  I agree with the assessment, diagnosis, and plan of care documented in the resident's note.  Morbid obesity with Bmi >35 associated with serious comorbidity of hypertension and prediabetes.

## 2023-09-05 NOTE — Addendum Note (Signed)
Addended by: Dickie La on: 09/05/2023 11:14 AM   Modules accepted: Level of Service

## 2023-09-19 ENCOUNTER — Ambulatory Visit: Payer: 59 | Admitting: Licensed Clinical Social Worker

## 2023-09-19 DIAGNOSIS — F439 Reaction to severe stress, unspecified: Secondary | ICD-10-CM | POA: Diagnosis not present
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
## 2023-10-03 ENCOUNTER — Ambulatory Visit: Payer: 59 | Admitting: Licensed Clinical Social Worker

## 2023-10-03 ENCOUNTER — Ambulatory Visit: Payer: 59 | Admitting: Student

## 2023-10-03 DIAGNOSIS — F411 Generalized anxiety disorder: Secondary | ICD-10-CM | POA: Diagnosis not present

## 2023-10-03 DIAGNOSIS — Z7985 Long-term (current) use of injectable non-insulin antidiabetic drugs: Secondary | ICD-10-CM | POA: Diagnosis not present

## 2023-10-03 DIAGNOSIS — F32A Depression, unspecified: Secondary | ICD-10-CM

## 2023-10-03 DIAGNOSIS — R7303 Prediabetes: Secondary | ICD-10-CM

## 2023-10-03 DIAGNOSIS — Z62820 Parent-biological child conflict: Secondary | ICD-10-CM

## 2023-10-03 DIAGNOSIS — Z6835 Body mass index (BMI) 35.0-35.9, adult: Secondary | ICD-10-CM | POA: Diagnosis not present

## 2023-10-03 MED ORDER — MOUNJARO 5 MG/0.5ML ~~LOC~~ SOAJ: 5.0000 mg | SUBCUTANEOUS | 0 refills | Status: DC

## 2023-10-03 NOTE — BH Specialist Note (Signed)
Integrated Behavioral Health Follow Up In-Person Visit  MRN: [id number] Name: [name]  Number of Integrated Behavioral Health Clinician visits: Additional Visit  Session Start time: 0845   Session End time: 0945  Total time in minutes: 60   Types of Service: Individual psychotherapy and Telephone visit  Interpretor:No. Interpretor Name and Language: N/A  Subjective: LETA [name] is a 63 y.o. female  Patient was referred by PCP for [facility].  Patient reports the following symptoms/concerns: LCSW-A, [facility] conducted a telehealth session via HIPAA-compliant telephone with the patient on Thursday, October 03, 2023. The session focused on several key areas of the patient's life and emotional experiences. The patient began by sharing a recent dream that featured symbolic elements, including an encounter with a snake and a black and grey tree of life. This led to a discussion about the potential meanings and personal significance of these dream symbols, allowing the patient to explore her subconscious thoughts and feelings. A notable shift in the patient's self-perception was evident as she expressed feeling deserving of celebrating her birthday, contrasting with previous feelings of it being "crazy" to have a birthday party. This change in attitude was explored, highlighting the patient's growing self-acceptance and value. The session also touched on the patient's reflections about motherhood. She shared thoughts and feelings about being a good mother, demonstrating a desire for self-improvement and a commitment to her role as a parent. The patient mentioned implementing a two-week "no spend" challenge. This decision was briefly discussed, considering its potential impact on her daily life and financial habits. Lastly, the patient brought up a conflict with her younger sister. While details were not extensively explored, this issue was acknowledged as a current stressor in the patient's  life. The session provided a supportive environment for the patient to express and explore various aspects of her life, from symbolic dreams to practical challenges and relationships. Future sessions may continue to build on these themes as the patient progresses in her therapeutic journey.   Objective: Mood: NA and Affect: Appropriate Risk of harm to self or others: No plan to harm self or others    Patient and/or Family's Strengths/Protective Factors: Social connections  Goals Addressed: Patient will:  Reduce symptoms of: anxiety and depression    Progress towards Goals: Ongoing  Interventions: Interventions utilized:  Mindfulness or Management consultant and CBT Cognitive Behavioral Therapy Standardized Assessments completed: Not Needed  Patient and/or Family Response: Patient is benefiting from services  Patient Centered Plan: Patient is on the following Treatment Plan(s): OPT Assessment: Patient currently experiencing Anxiety.   Patient may benefit from Ongoing therapy.  Plan: Follow up with behavioral health clinician on : 03/06  [name], MSW, LCSW-A She/Her Behavioral Health Clinician [facility]  Internal Medicine Center Direct Dial:[phone]  Fax [phone] Main Office Phone: [phone] [address] [place] [place], [place] [address] Website: [facility] Internal Medicine [facility]  [place], [place]  [place]

## 2023-10-03 NOTE — Assessment & Plan Note (Addendum)
This patient has a history of morbid obesity in the setting of prediabetes. Patient was initially initiated on Ozempic and titrated up to 2 mg weekly.  Reported to our office earlier this year to express concerns of abdominal discomfort and nausea probably from  Ozempic.  She was switched to [facility] 2.5 mg weekly on January 2025 and she is  presenting for further assessment of this medication and also to assess if she is tolerating it well.  Side effects of Mounjaro such as serious allergic reaction, kidney failure, severe stomach pain, pancreatitis and vision changes adequately discussed with the patient today.  Patient denies any of these symptoms at this time. Patient's weight before initiating Mounjaro was 249. She now weighs 248. Plan : I will increase the patient's Mounjaro dose to 5 mg weekly and encourage patient to let us know if she experiences any of  the side effect discussed in the office today  -Increase Mounjaro to 5 mg weekly -Follow-up in 4 to 5 weeks

## 2023-10-03 NOTE — Assessment & Plan Note (Signed)
Currently stable with  a recent Hgb A1C of 5.5 two months ago. Will refer to [facility] Nutrition & Diabetes education services for further education and management to continue keeping her A1c at goal.

## 2023-10-03 NOTE — Progress Notes (Addendum)
CC: Diabetes medication management  HPI:  [name] is a 63 y.o. female living with a history stated below and presents today for diabetes medication management and also discuss other chronic conditions.. Please see problem based assessment and plan for additional details.  Past Medical History:  Diagnosis Date   Anemia 2009    Baseline hemoglobin at 11, secondary to iron deficiency   Arthritis    Breast mass 02/2008    Noted on mammogram March 11, 2008 3 cm simple cyst at 9 to 10:00 position of the right breast as well as multiple other smaller simple cyst, 2.6 cm mass at the 10:00 position of the right breast also representing complex cyst,   Deep vein thrombosis (DVT) (HCC)     history of multiple  DVTs in 1989, 1989, 2004, on chronic anticoagulation with Coumadin   Diverticulosis    noted colonoscopy 2010   Eczema    Fibroid uterus  2001    status post total abdominal hysterectomy, right and left salpingo-oophorectomy , done by Dr. 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    her biopsy results of the left breast excisional biopsy of mass, phyllodes tumeor with physical borderline features, 2.6 cm, margins negative, fibrocystic change including duct ectasia, fibrosis, apocrine metaplasia and focal calcification    Current Outpatient Medications on File Prior to Visit  Medication Sig Dispense Refill   albuterol (VENTOLIN HFA) 108 (90 Base) MCG/ACT inhaler INHALE 1-2 PUFFS BY MOUTH EVERY 6 HOURS AS NEEDED FOR WHEEZE OR SHORTNESS OF BREATH 8.5 each 1   Blood Pressure Monitor DEVI Use to check blood pressure once daily 1 each 0   cyclobenzaprine (FLEXERIL) 5 MG  tablet TAKE 1 TABLET BY MOUTH AT BEDTIME AS NEEDED FOR MUSCLE SPASMS. 30 tablet 0   Multiple Vitamin (MULTIVITAMIN) capsule Take 1 capsule daily by mouth.     rosuvastatin (CRESTOR) 20 MG tablet TAKE 1 TABLET BY MOUTH EVERY DAY 90 tablet 3   No current facility-administered medications on file prior to visit.    Family History  Problem Relation Age of Onset   Gout Father    Stroke Other    Cancer Other    Clotting disorder Other     Social History   Socioeconomic History   Marital status: Married    Spouse name: Not on file   Number of children: Not on file   Years of education: Not on file   Highest education level: Not on file  Occupational History   Occupation: unemployed  Tobacco Use   Smoking status: Former    Types: Cigarettes   Smokeless tobacco: Never  Vaping Use   Vaping status: Never Used  Substance and Sexual Activity   Alcohol use: Yes    Comment: rare   Drug use: No   Sexual activity: Not on file  Other Topics Concern   Not on file  Social History Narrative   Not on file   Social Drivers of Health   Financial Resource Strain: High Risk (09/04/2023)   Overall Financial Resource Strain (CARDIA)    Difficulty  of Paying Living Expenses: Very hard  Food Insecurity: Food Insecurity Present (09/04/2023)   Hunger Vital Sign    Worried About Running Out of Food in the Last Year: Sometimes true    Ran Out of Food in the Last Year: Sometimes true  Transportation Needs: No Transportation Needs (09/04/2023)   PRAPARE - Administrator, Civil Service (Medical): No    Lack of Transportation (Non-Medical): No  Physical Activity: Insufficiently Active (09/04/2023)   Exercise Vital Sign    Days of Exercise per Week: 2 days    Minutes of Exercise per Session: 60 min  Stress: Stress Concern Present (09/04/2023)   [employer] of Occupational Health - Occupational Stress Questionnaire    Feeling of Stress : To some extent  Social Connections: Moderately  Integrated (09/04/2023)   Social Connection and Isolation Panel [NHANES]    Frequency of Communication with Friends and Family: More than three times a week    Frequency of Social Gatherings with Friends and Family: Never    Attends Religious Services: 1 to 4 times per year    Active Member of [employer] or Organizations: Yes    Attends Banker Meetings: 1 to 4 times per year    Marital Status: Divorced  Catering manager Violence: Not At Risk (09/04/2023)   Humiliation, Afraid, Rape, and Kick questionnaire    Fear of Current or Ex-Partner: No    Emotionally Abused: No    Physically Abused: No    Sexually Abused: No    Review of Systems: ROS negative except for what is noted on the assessment and plan.  Vitals:   10/03/23 0952 10/03/23 1000  BP: (!) 156/84 134/79  Pulse: 68 65  Temp: 98.1 F (36.7 C)   TempSrc: Oral   SpO2: 99%   Weight: 248 lb 12.8 oz (112.9 kg)   Height: 5\' 10"  (1.778 m)     Physical Exam: Constitutional: Obese appearing woman, sitting in chair, in no acute distress. Cardiovascular: regular rate and rhythm, no m/r/g Pulmonary/Chest: normal work of breathing on room air, lungs clear to auscultation bilaterally MSK: normal bulk and tone. Neurological: alert & oriented x 3, no focal deficit Skin: warm and dry Psych: normal mood and behavior  Assessment & Plan:   Obesity, morbid (HCC) This patient has a history of morbid obesity in the setting of prediabetes. Patient was initially initiated on Ozempic and titrated up to 2 mg weekly.  Reported to our office earlier this year to express concerns of abdominal discomfort and nausea probably from  Ozempic.  She was switched to [facility] 2.5 mg weekly on January 2025 and she is  presenting for further assessment of this medication and also to assess if she is tolerating it well.  Side effects of Mounjaro such as serious allergic reaction, kidney failure, severe stomach pain, pancreatitis and vision changes  adequately discussed with the patient today.  Patient denies any of these symptoms at this time. Patient's weight before initiating Mounjaro was 249. She now weighs 248. Plan : I will increase the patient's Mounjaro dose to 5 mg weekly and encourage patient to let us know if she experiences any of  the side effect discussed in the office today  -Increase Mounjaro to 5 mg weekly -Follow-up in 4 to 5 weeks  Prediabetes Currently stable with  a recent Hgb A1C of 5.5 two months ago. Will refer to [facility] Nutrition & Diabetes education services for further education and management to continue keeping  her A1c at goal.   Patient discussed with Dr. Joycelyn Das, M.D [facility] Internal Medicine Phone: [phone] Date 10/03/2023 Time 3:33 PM

## 2023-10-03 NOTE — Patient Instructions (Signed)
Thank you, [name] for allowing [place] to provide your care today. Today we discussed your diabetes medication management.  He was switched from Ozempic to [facility] a month ago and it seems like you are tolerating the medication very well.  I am increasing your Mounjaro dose from 2.5 to 5 mg for the next 4 weeks.  This let us know if you have any diarrhea, vomiting, stomach pain or any discomfort from this medication.  I have ordered the following labs for you:  Lab Orders  No laboratory test(s) ordered today     Tests ordered today:    Referrals ordered today:   Referral Orders  No referral(s) requested today     I have ordered the following medication/changed the following medications:   Stop the following medications: Medications Discontinued During This Encounter  Medication Reason   tirzepatide (MOUNJARO) 2.5 MG/0.5ML Pen Dose change     Start the following medications: Meds ordered this encounter  Medications   tirzepatide (MOUNJARO) 5 MG/0.5ML Pen    Sig: Inject 5 mg into the skin once a week.    Dispense:  2 mL    Refill:  0     Follow up:  one month     Remember:   Should you have any questions or concerns please call the internal medicine clinic at [phone].    Kathleen Lime, M.D [facility] Internal Medicine Center

## 2023-10-07 NOTE — Progress Notes (Signed)
 Internal Medicine Clinic Attending  Case discussed with the resident physician at the time of the visit.  We reviewed the patient's history, exam, and pertinent patient test results.  I agree with the assessment, diagnosis, and plan of care documented in the resident's note.

## 2023-10-22 ENCOUNTER — Other Ambulatory Visit: Payer: Self-pay | Admitting: Student

## 2023-10-31 ENCOUNTER — Ambulatory Visit (INDEPENDENT_AMBULATORY_CARE_PROVIDER_SITE_OTHER): Payer: 59 | Admitting: Student

## 2023-10-31 ENCOUNTER — Ambulatory Visit: Payer: 59 | Admitting: Licensed Clinical Social Worker

## 2023-10-31 VITALS — BP 130/71 | HR 72 | Ht 70.0 in | Wt 244.8 lb

## 2023-10-31 DIAGNOSIS — K5900 Constipation, unspecified: Secondary | ICD-10-CM | POA: Diagnosis not present

## 2023-10-31 DIAGNOSIS — F32A Depression, unspecified: Secondary | ICD-10-CM | POA: Diagnosis not present

## 2023-10-31 DIAGNOSIS — F411 Generalized anxiety disorder: Secondary | ICD-10-CM

## 2023-10-31 DIAGNOSIS — R7303 Prediabetes: Secondary | ICD-10-CM

## 2023-10-31 DIAGNOSIS — E785 Hyperlipidemia, unspecified: Secondary | ICD-10-CM

## 2023-10-31 DIAGNOSIS — Z6835 Body mass index (BMI) 35.0-35.9, adult: Secondary | ICD-10-CM

## 2023-10-31 DIAGNOSIS — K5903 Drug induced constipation: Secondary | ICD-10-CM

## 2023-10-31 MED ORDER — POLYETHYLENE GLYCOL 3350 17 G PO PACK: 17.0000 g | PACK | Freq: Every day | ORAL | 0 refills | Status: AC

## 2023-10-31 MED ORDER — MOUNJARO 7.5 MG/0.5ML ~~LOC~~ SOAJ: 7.5000 mg | SUBCUTANEOUS | 1 refills | Status: DC

## 2023-10-31 NOTE — BH Specialist Note (Signed)
 Integrated Behavioral Health Follow Up In-Person Visit  MRN: [id number] Name: [name]  Number of Integrated Behavioral Health Clinician visits: Additional Visit  Session Start time: 0930   Session End time: 1030  Total time in minutes: 60   Types of Service: Individual psychotherapy and General Behavioral Integrated Care (BHI)    Subjective: [name] is a 63 y.o. female = Patient was referred by PCP  for [facility]. Patient reports the following symptoms/concerns: Today, I, an LCSW-A, [facility], conducted an in-person session with the patient. During the session, the patient expressed both excitement and anxiety regarding her upcoming 63th birthday. She has demonstrated significant effort in planning and advocating for her wants and needs for the celebration, showcasing her ability to take initiative and assert herself effectively.  The patient acknowledged the challenges of interacting with different personalities and will continue to work on developing patience and understanding in these situations. Additionally, she recognized the need to minimize her focus on negative "what ifs" and instead apply strength-based practices. This approach will be complemented by her Ephriam Knuckles beliefs, which emphasize being slow to respond or become angry. By integrating these strategies, the patient aims to cultivate a more positive and resilient mindset as she navigates life's challenges.  The session concluded with a plan for the patient to continue practicing these skills and reflecting on her progress in future sessions.  Mood: Anxious and Affect: Appropriate Risk of harm to self or others: No plan to harm self or others   Patient and/or Family's Strengths/Protective Factors: Social connections  Goals Addressed: Patient will:  Reduce symptoms of: anxiety    Progress towards Goals: Ongoing  Interventions: Interventions utilized:  Motivational Interviewing and Mindfulness or  Relaxation Training Standardized Assessments completed: Not Needed  Patient and/or Family Response: Continued OPT  Patient Centered Plan: Patient is on the following Treatment Plan(s): OPT Assessment: Patient currently experiencing Anxiety.   Patient may benefit from OPT.  Plan: Follow up with behavioral health clinician on : Patient scheduled   [name], MSW, LCSW-A She/Her Behavioral Health Clinician [facility]  Internal Medicine Center Direct [phone]  Fax [phone] Main Office Phone: [phone] [address] [place] [place], [place] [address] Website: [facility] Internal Medicine [facility]  [place], [place]  [place]

## 2023-10-31 NOTE — Progress Notes (Signed)
 Subjective:  CC: Follow-up on weight loss and lifestyle modification  HPI:  [name] is a 63 y.o. person with a past medical history stated below and presents today for the stated chief complaint. Please see problem based assessment and plan for additional details.  Past Medical History:  Diagnosis Date   Anemia 2009    Baseline hemoglobin at 11, secondary to iron deficiency   Arthritis    Breast mass 02/2008    Noted on mammogram March 11, 2008 3 cm simple cyst at 9 to 10:00 position of the right breast as well as multiple other smaller simple cyst, 2.6 cm mass at the 10:00 position of the right breast also representing complex cyst,   Deep vein thrombosis (DVT) (HCC)     history of multiple  DVTs in 1989, 1989, 2004, on chronic anticoagulation with Coumadin   Diverticulosis    noted colonoscopy 2010   Eczema    Fibroid uterus  2001    status post total abdominal hysterectomy, right and left salpingo-oophorectomy , done by Dr. 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    her biopsy results of the left breast excisional biopsy of mass, phyllodes tumeor with physical borderline features, 2.6 cm, margins negative, fibrocystic change including duct ectasia, fibrosis, apocrine metaplasia and focal calcification    Current Outpatient Medications on File Prior to Visit  Medication Sig Dispense Refill   albuterol (VENTOLIN HFA) 108 (90 Base) MCG/ACT inhaler INHALE 1-2 PUFFS BY MOUTH EVERY 6 HOURS AS NEEDED FOR WHEEZE OR SHORTNESS OF BREATH 8.5 each 1   Blood Pressure Monitor DEVI Use to check blood pressure once daily 1 each 0   cyclobenzaprine (FLEXERIL) 5 MG  tablet TAKE 1 TABLET BY MOUTH AT BEDTIME AS NEEDED FOR MUSCLE SPASMS. 30 tablet 0   Multiple Vitamin (MULTIVITAMIN) capsule Take 1 capsule daily by mouth.     rosuvastatin (CRESTOR) 20 MG tablet TAKE 1 TABLET BY MOUTH EVERY DAY 90 tablet 3   No current facility-administered medications on file prior to visit.    Review of Systems: Please see assessment and plan for pertinent positives and negatives.  Objective:   Vitals:   10/31/23 0848  BP: 130/71  Pulse: 72  SpO2: 100%  Weight: 244 lb 12.8 oz (111 kg)  Height: 5\' 10"  (1.778 m)    Physical Exam: Constitutional: Well-appearing Cardiovascular: Regular rate and rhythm Pulmonary/Chest: lungs clear to auscultation bilaterally Abdominal: soft, non-tender, non-distended Extremities: No edema of the lower extremities bilaterally Psych: Pleasant affect Thought process is linear and is goal-directed.     Assessment & Plan:  Obesity, morbid (HCC) Which is appetite dose increased to 5 mg weekly at last visit.  Patient continues to do well with this, she has lost about 4 pounds since then.  Denies nausea, vomiting, diarrhea, abdominal pain.  She does have a bit of constipation but drinks prune juice which helps her go regularly.  With wrenches she goes about once a day, without maybe every 2 to 3 days.  She would be interested in continuing to dose increase her GLP-1  as well as continue to make lifestyle changes.  She is working hard in [employer] and states that her strength and balance have improved. Plan: Dose increase shows appetite to 7.5 mg weekly Return video visit 1 month to follow-up Discussion on diet, exercise, and lifestyle modifications MiraLAX for constipation    Patient discussed with Dr. Alver Sorrow MD [facility] Internal Medicine  PGY-1 Pager: [phone]  Phone: [phone] Date 10/31/2023  Time 9:25 AM

## 2023-10-31 NOTE — Patient Instructions (Addendum)
 Thank you, [name] for allowing [place] to provide your care today.   I have ordered the following medication/changed the following medications:   Stop the following medications: Medications Discontinued During This Encounter  Medication Reason   tirzepatide Greggory Keen) 5 MG/0.5ML Pen      Start the following medications: Meds ordered this encounter  Medications   polyethylene glycol (MIRALAX) 17 g packet    Sig: Take 17 g by mouth daily.    Dispense:  14 each    Refill:  0   tirzepatide (MOUNJARO) 7.5 MG/0.5ML Pen    Sig: Inject 7.5 mg into the skin once a week.    Dispense:  2 mL    Refill:  1     Follow up:  1 month  for video visit  Please remember:   Recommendations for weight loss and blood glucose control.   1) Cut out liquid sources of calories such as soda, juices, sports drinks, alcohol, sweet teas, and coffee with added sugars. These are "empty calories" meaning they bring your blood sugar up and add weight to your body without making you feel full.   2) Avoid food with added sugars. Some examples would be cakes, candies, and sauces (ie. ketchup and BBQ). You may be surprised how much sugar they sneak into processed foods.   3) Replace foods high in starches such as pasta, rice, potatoes, or breads with foods higher in protein such as meat.  Protein rich foods are more satiating - meaning they make you feel more full than carbohydrates. They also provide the body with important nutrients that we need. You can read nutritional labels to get more information regarding how much protein, carbohydrates, and fats foods contain.    We look forward to seeing you next time. Please call our clinic at [phone] if you have any questions or concerns. The best time to call is Monday-Friday from 9am-4pm, but there is someone available 24/7. If after hours or the weekend, call the main hospital number and ask for the Internal Medicine Resident On-Call. If you need  medication refills, please notify your pharmacy one week in advance and they will send [place] a request.   Thank you for trusting me with your care. Wishing you the best!  Lovie Macadamia MD [facility] Internal Medicine Center

## 2023-10-31 NOTE — Assessment & Plan Note (Signed)
 Which is appetite dose increased to 5 mg weekly at last visit.  Patient continues to do well with this, she has lost about 4 pounds since then.  Denies nausea, vomiting, diarrhea, abdominal pain.  She does have a bit of constipation but drinks prune juice which helps her go regularly.  With wrenches she goes about once a day, without maybe every 2 to 3 days.  She would be interested in continuing to dose increase her GLP-1 as well as continue to make lifestyle changes.  She is working hard in [employer] and states that her strength and balance have improved. Plan: Dose increase shows appetite to 7.5 mg weekly Return video visit 1 month to follow-up Discussion on diet, exercise, and lifestyle modifications MiraLAX for constipation

## 2023-11-04 NOTE — Progress Notes (Signed)
 Internal Medicine Clinic Attending  Case discussed with the resident at the time of the visit.  We reviewed the resident's history and exam and pertinent patient test results.  I agree with the assessment, diagnosis, and plan of care documented in the resident's note.

## 2023-11-14 ENCOUNTER — Ambulatory Visit (INDEPENDENT_AMBULATORY_CARE_PROVIDER_SITE_OTHER): Admitting: Licensed Clinical Social Worker

## 2023-11-14 DIAGNOSIS — F411 Generalized anxiety disorder: Secondary | ICD-10-CM

## 2023-11-14 NOTE — Patient Instructions (Signed)
 Visit Information  Thank you for taking time to visit with me today. Please don't hesitate to contact me if I can be of assistance to you.   Following are the goals we discussed today:   **Anticipatory anxiety** refers to excessive worry or fear about a future event or situation, often focusing on worst-case scenarios or perceived inability to cope. It is characterized by heightened nervousness, restlessness, and physical symptoms such as increased heart rate, muscle tension, or stomach discomfort. Unlike general anxiety, anticipatory anxiety is specifically tied to upcoming events, such as social gatherings, presentations, medical appointments, or other stress-inducing situations.  This type of anxiety is commonly associated with conditions like **generalized anxiety disorder (GAD)**, **social anxiety disorder**, or **panic disorder**, but it can also occur in individuals without a formal diagnosis. While anticipatory anxiety typically resolves after the event passes, it can become problematic if it occurs frequently or interferes significantly with daily functioning.  The patient requested information about anxiety medication and expressed interest in exploring options.[facility] provided support and briefly discussed anxiety medications, encouraging the patient to prepare specific questions for her primary care provider (PCP). Coping skills for managing anxiety were reviewed, including deep breathing exercises, grounding techniques (e.g., focusing on the five senses), journaling thoughts and emotions, engaging in physical activity, and practicing mindfulness to help regulate emotional responses.  https://positivepsychology.com/anxiety-coping-skills/  http://lyons.net/  ------Creative and Distraction Techniques Journaling: Write down thoughts and emotions to process feelings and identify trigger  -----Distraction Activities:  Solve puzzles, play games, or watch  comforting shows to shift focus away from anxiety  Positive Self-Talk Replace negative self-talk with affirmations such as "I can handle this" or "This is an opportunity to grow." This helps build confidence and reduce fear  Reminder of Appointments:  Primary Care Appointment: Scheduled with [name] on 04/03 at 1:15 PM.  Follow-Up with [facility]: Scheduled immediately after the PCP appointment on 04/03.

## 2023-11-14 NOTE — BH Specialist Note (Signed)
 Integrated Behavioral Health Follow Up In-Person Visit  MRN: [id number] Name: [name]  Number of Integrated Behavioral Health Clinician visits: Additional Visit  Session Start time: 0830   Session End time: 0930  Total time in minutes: 60   Types of Service: Individual psychotherapy   Subjective: [name] is a 63 y.o. female  Patient was referred by PCP for [facility]. Patient reports the following symptoms/concerns: The Behavioral Health Consultant [facility]) conducted a session with the patient today. During the session, the patient expressed experiencing heightened anxiety and reported engaging in emotional eating and binge eating behaviors. A Generalized Anxiety Disorder-7 (GAD-7) assessment was completed, yielding a score of 21, indicating severe anxiety. The patient requested medication to assist with anxiety management on an as-needed basis at a low dose.  The patient described her emotional state metaphorically, likening it to "someone throwing confetti up in the air and everything going around her," reflecting feelings of chaos and lack of control. She also verbalized experiencing uncontrollable binge eating and emotional eating, alongside a sense of "spiraling." Additionally, the patient reported being quick-tempered and responding to situations with limited emotional regulation.  The patient identified an upcoming birthday party as a significant trigger for her anxiety. She also noted multiple stressors within her family contributing to her current emotional state. The [facility] acknowledged the patient's concerns and briefly discussed anxiety medications, as the patient is unfamiliar with them. The [facility] recommended that the patient prepare specific questions for her primary care provider (PCP) regarding medication options during her scheduled visit.  The patient inquired about the possibility of being seen by her PCP today. The [facility] sent a message to the PCP, added front desk  scheduling details, and advised the patient to stop by the front desk for further assistance. The patient has been scheduled for an appointment with [name] on 04/03 at 1:15 PM and will follow up with the [facility] after that appointment.                           RESOURCES FOR ANXIETY!   [email or web address]  [email or web address].net/  -------Creative and Distraction Techniques Journaling: Write down thoughts and emotions to process feelings and identify trigger  --------Distraction Activities: Solve puzzles, play games, or watch comforting shows to shift focus away from anxiety  **Anticipatory anxiety** refers to excessive worry or fear about a future event or situation, often focusing on worst-case scenarios or perceived inability to cope. It is characterized by heightened nervousness, restlessness, and physical symptoms such as increased heart rate, muscle tension, or stomach discomfort. Unlike general anxiety, anticipatory anxiety is specifically tied to upcoming events, such as social gatherings, presentations, medical appointments, or other stress-inducing situations.  This type of anxiety is commonly associated with conditions like **generalized anxiety disorder (GAD)**, **social anxiety disorder**, or **panic disorder**, but it can also occur in individuals without a formal diagnosis. While anticipatory anxiety typically resolves after the event passes, it can become problematic if it occurs frequently or interferes significantly with daily functioning.    Duration of problem: more than a year; Severity of problem: severe  Objective: Mood: Anxious and Irritable and Affect: Appropriate Risk of harm to self or others: No plan to harm self or others  Life Context: Family and Social: Patient has a significant support system  Self-Care: Patient enjoys going to the gym and spending time with family and friends.  Life Changes:  Patient has ongoing issues with Emotional Eating/ Binge Eating resulting  in over eating and weight gain.   Patient and/or Family's Strengths/Protective Factors: Social and Emotional competence and Sense of purpose  Goals Addressed: Patient will:  Reduce symptoms of: anxiety    Progress towards Goals: Ongoing  Interventions: Interventions utilized:  CBT Cognitive Behavioral Therapy Standardized Assessments completed: GAD-7    11/14/2023    9:38 AM 09/04/2023   10:43 AM 05/05/2019   11:02 AM  GAD 7 : Generalized Anxiety Score  Nervous, Anxious, on Edge 3 0 0  Control/stop worrying 3 0 1  Worry too much - different things 3 1 1   Trouble relaxing 3 0 1  Restless 3 0 1  Easily annoyed or irritable 3 0 1  Afraid - awful might happen 3 0 0  Total GAD 7 Score 21 1 5   Anxiety Difficulty  Not difficult at all Somewhat difficult     Patient and/or Family Response: Patient agrees to starting medication management.   Patient Centered Plan: Patient is on the following Treatment Plan(s): Medication Management and OPT Assessment: Patient currently experiencing Severe Anxiety .   Patient may benefit from Medication management and OPT.  Plan: Follow up with behavioral health clinician on : Appointment with [name] on 04/03 at 1:15 PM and will follow up with the [facility] after that appointment  [name], MSW, LCSW-A She/Her Behavioral Health Clinician [facility]  Internal Medicine Center Direct Dial:[phone]  Fax [phone] Main Office Phone: [phone] [address] [place] [place], [place] [address] Website: [facility] Internal Medicine [facility]  [place], [place]  [facility]

## 2023-11-21 ENCOUNTER — Ambulatory Visit: Admitting: Student

## 2023-11-21 ENCOUNTER — Ambulatory Visit (INDEPENDENT_AMBULATORY_CARE_PROVIDER_SITE_OTHER): Admitting: Licensed Clinical Social Worker

## 2023-11-21 VITALS — BP 137/76 | HR 99 | Wt 246.9 lb

## 2023-11-21 DIAGNOSIS — R7303 Prediabetes: Secondary | ICD-10-CM

## 2023-11-21 DIAGNOSIS — Z6835 Body mass index (BMI) 35.0-35.9, adult: Secondary | ICD-10-CM

## 2023-11-21 DIAGNOSIS — F411 Generalized anxiety disorder: Secondary | ICD-10-CM

## 2023-11-21 DIAGNOSIS — E785 Hyperlipidemia, unspecified: Secondary | ICD-10-CM

## 2023-11-21 DIAGNOSIS — F419 Anxiety disorder, unspecified: Secondary | ICD-10-CM

## 2023-11-21 MED ORDER — TIRZEPATIDE 15 MG/0.5ML ~~LOC~~ SOAJ: 15.0000 mg | SUBCUTANEOUS | 3 refills | Status: DC

## 2023-11-21 MED ORDER — SERTRALINE HCL 25 MG PO TABS: 25.0000 mg | ORAL_TABLET | Freq: Every day | ORAL | 1 refills | Status: DC

## 2023-11-21 NOTE — Assessment & Plan Note (Signed)
 Last seen in the clinic on 11/21/23. At this time, her dosage of Mounjaro was increased to 7.5 mg/week. Patient continues to also control her weight with lifestyle modifications including strength and balance exercises in the gym as well as a well-balanced diet. She denies any symptoms. At this time, will increase to 15 mg/week. - Increase Mounjaro to 50 mg/week

## 2023-11-21 NOTE — BH Specialist Note (Signed)
 Integrated Behavioral Health Follow Up In-Person Visit  MRN: [id number] Name: [name]  Number of Integrated Behavioral Health Clinician visits: Additional Visit  Session Start time: 1350   Session End time: 1430  Total time in minutes: 40   Types of Service: Individual psychotherapy  Subjective: [name] is a 63 y.o. female  Patient was referred by PCP for [facility]. Patient reports the following symptoms/concerns: The Behavioral Health Consultant [facility]) conducted a session with the patient today. During the session, the patient expressed experiencing inability to regulate emotions. Session discussed patient emotional triggers and situations.  Patient and [facility] discussed:  Following are the goals we discussed today: Emotional regulation and ways to cope with family conflict. " NEVER WIN AN ARGUMENT".  [name]  Please watch and share with family.   [email or web address] [phone]  [email or web address]   Objective: Mood: Anxious and Irritable and Affect: Appropriate Risk of harm to self or others: No plan to harm self or others   Life Context: Family and Social: Patient has a significant support system   Self-Care: Patient enjoys going to the gym and spending time with family and friends.  Life Changes: Patient has ongoing issues with Emotional Eating/ Binge Eating resulting in over eating and weight gain.    Patient and/or Family's Strengths/Protective Factors: Social and Emotional competence and Sense of purpose   Goals Addressed: Patient will:  Reduce symptoms of: anxiety      Progress towards Goals: Ongoing   Interventions: Interventions utilized:  CBT Cognitive Behavioral Therapy     Patient and/or Family Response: Patient agrees to starting medication management.    Patient Centered Plan: Patient is on the following Treatment Plan(s): Medication Management and  OPT Assessment: Patient currently experiencing Severe Anxiety .    Patient may benefit from Medication management and OPT.   Plan: Follow up with behavioral health clinician on : [facility] on 04/10 for 30 min In-Person   [name], MSW, LCSW-A She/Her Behavioral Health Clinician [facility]  Internal Medicine Center Direct Dial:[phone]  Fax [phone] Main Office Phone: [phone] [address] [place], [place] [address] Website: [facility] Internal Medicine [facility]  [place], [place]  [place]

## 2023-11-21 NOTE — Assessment & Plan Note (Signed)
 Patient has history of generalized anxiety and she currently follows with Marena Chancy.  Patient notes improvement of her anxiety since meeting with Marena Chancy but still endorses racing thoughts at night.  PHQ-9 score of 12 today.  GAD-7 score of 10 today.  At this time, will start sertraline 25 mg daily.  Patient counseled that it may take 4 to 6 weeks for her to feel the clinical benefits of this medication.  She denies any history of mania or concerns for bipolar disorder.  Will follow-up with 4 weeks for possible up titration of her medication. - Begin sertraline 25 mg daily

## 2023-11-21 NOTE — Progress Notes (Signed)
 CC: Anxiety and weight management  HPI: [name] is a 63 y.o. female living with a history stated below and presents today for anxiety and weight management. Please see problem based assessment and plan for additional details.  Past Medical History:  Diagnosis Date   Anemia 2009    Baseline hemoglobin at 11, secondary to iron deficiency   Arthritis    Breast mass 02/2008    Noted on mammogram March 11, 2008 3 cm simple cyst at 9 to 10:00 position of the right breast as well as multiple other smaller simple cyst, 2.6 cm mass at the 10:00 position of the right breast also representing complex cyst,   Deep vein thrombosis (DVT) (HCC)     history of multiple  DVTs in 1989, 1989, 2004, on chronic anticoagulation with Coumadin   Diverticulosis    noted colonoscopy 2010   Eczema    Fibroid uterus  2001    status post total abdominal hysterectomy, right and left salpingo-oophorectomy , done by Dr. 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    her biopsy results of the left breast excisional biopsy of mass, phyllodes tumeor with physical borderline features, 2.6 cm, margins negative, fibrocystic change including duct ectasia, fibrosis, apocrine metaplasia and focal calcification    Current Outpatient Medications on File Prior to Visit  Medication Sig Dispense Refill   albuterol (VENTOLIN HFA) 108 (90 Base) MCG/ACT inhaler INHALE 1-2 PUFFS BY MOUTH EVERY 6 HOURS AS NEEDED FOR WHEEZE OR SHORTNESS OF BREATH 8.5 each 1   Blood Pressure Monitor DEVI Use to check blood pressure once daily 1 each 0   cyclobenzaprine (FLEXERIL) 5 MG tablet TAKE 1 TABLET BY MOUTH AT BEDTIME AS  NEEDED FOR MUSCLE SPASMS. 30 tablet 0   Multiple Vitamin (MULTIVITAMIN) capsule Take 1 capsule daily by mouth.     polyethylene glycol (MIRALAX) 17 g packet Take 17 g by mouth daily. 14 each 0   rosuvastatin (CRESTOR) 20 MG tablet TAKE 1 TABLET BY MOUTH EVERY DAY 90 tablet 3   No current facility-administered medications on file prior to visit.    Family History  Problem Relation Age of Onset   Gout Father    Stroke Other    Cancer Other    Clotting disorder Other     Social History   Socioeconomic History   Marital status: Married    Spouse name: Not on file   Number of children: Not on file   Years of education: Not on file   Highest education level: Not on file  Occupational History   Occupation: unemployed  Tobacco Use   Smoking status: Former    Types: Cigarettes   Smokeless tobacco: Never  Vaping Use   Vaping status: Never Used  Substance and Sexual Activity   Alcohol use: Yes    Comment: rare   Drug use: No   Sexual activity: Not on file  Other Topics Concern   Not on file  Social History Narrative   Not on file   Social Drivers of Health   Financial Resource Strain: High Risk (  09/04/2023)   Overall Financial Resource Strain (CARDIA)    Difficulty of Paying Living Expenses: Very hard  Food Insecurity: Food Insecurity Present (09/04/2023)   Hunger Vital Sign    Worried About Running Out of Food in the Last Year: Sometimes true    Ran Out of Food in the Last Year: Sometimes true  Transportation Needs: No Transportation Needs (09/04/2023)   PRAPARE - Administrator, Civil Service (Medical): No    Lack of Transportation (Non-Medical): No  Physical Activity: Insufficiently Active (09/04/2023)   Exercise Vital Sign    Days of Exercise per Week: 2 days    Minutes of Exercise per Session: 60 min  Stress: Stress Concern Present (09/04/2023)   [employer] of Occupational Health - Occupational Stress Questionnaire    Feeling of Stress : To some  extent  Social Connections: Moderately Integrated (09/04/2023)   Social Connection and Isolation Panel [NHANES]    Frequency of Communication with Friends and Family: More than three times a week    Frequency of Social Gatherings with Friends and Family: Never    Attends Religious Services: 1 to 4 times per year    Active Member of [employer] or Organizations: Yes    Attends Banker Meetings: 1 to 4 times per year    Marital Status: Divorced  Catering manager Violence: Not At Risk (09/04/2023)   Humiliation, Afraid, Rape, and Kick questionnaire    Fear of Current or Ex-Partner: No    Emotionally Abused: No    Physically Abused: No    Sexually Abused: No    Review of Systems: ROS negative except for what is noted on the assessment and plan.  Vitals:   11/21/23 1336  BP: 137/76  Pulse: 99  SpO2: 98%  Weight: 246 lb 14.4 oz (112 kg)   Physical Exam: Constitutional: well-appearing in no acute distress HENT: normocephalic atraumatic, mucous membranes moist Eyes: conjunctiva non-erythematous Neck: supple Cardiovascular: regular rate and rhythm, no m/r/g Pulmonary/Chest: normal work of breathing on room air, lungs clear to auscultation bilaterally Abdominal: soft, non-tender, non-distended MSK: normal bulk and tone Neurological: alert & oriented x 3, 5/5 strength in bilateral upper and lower extremities, normal gait Skin: warm and dry  Assessment & Plan:   Obesity, morbid (HCC) Last seen in the clinic on 11/21/23. At this time, her dosage of Mounjaro was increased to 7.5 mg/week. Patient continues to also control her weight with lifestyle modifications including strength and balance exercises in the gym as well as a well-balanced diet. She denies any symptoms. At this time, will increase to 15 mg/week. - Increase Mounjaro to 50 mg/week  Generalized anxiety disorder Patient has history of generalized anxiety and she currently follows with Marena Chancy.  Patient notes improvement  of her anxiety since meeting with Marena Chancy but still endorses racing thoughts at night.  PHQ-9 score of 12 today.  GAD-7 score of 10 today.  At this time, will start sertraline 25 mg daily.  Patient counseled that it may take 4 to 6 weeks for her to feel the clinical benefits of this medication.  She denies any history of mania or concerns for bipolar disorder.  Will follow-up with 4 weeks for possible up titration of her medication. - Begin sertraline 25 mg daily  Patient discussed with Dr. Mariea Stable, MD  [facility] Internal Medicine, PGY-1 Date 11/21/2023 Time 3:03 PM

## 2023-11-21 NOTE — Patient Instructions (Addendum)
 Visit Information  Thank you for taking time to visit with me today. Please don't hesitate to contact me if I can be of assistance to you.   Following are the goals we discussed today: Emotional regulation and ways to cope with family conflict. " NEVER WIN AN ARGUMENT".  [name]  Please watch and share with family.   [email or web address] [phone]  [email or web address]

## 2023-11-21 NOTE — Patient Instructions (Signed)
 Thank you so much for coming to the clinic today!   Please start taking your sertraline daily.   We will see you in 4 weeks.  If you have any questions please feel free to the call the clinic at anytime at [phone]. It was a pleasure seeing you!  Best, [name]

## 2023-11-22 NOTE — Progress Notes (Signed)
 Internal Medicine Clinic Attending  Case discussed with the resident at the time of the visit.  We reviewed the resident's history and exam and pertinent patient test results.  I agree with the assessment, diagnosis, and plan of care documented in the resident's note.

## 2023-11-28 ENCOUNTER — Ambulatory Visit (INDEPENDENT_AMBULATORY_CARE_PROVIDER_SITE_OTHER): Admitting: Licensed Clinical Social Worker

## 2023-11-28 DIAGNOSIS — F411 Generalized anxiety disorder: Secondary | ICD-10-CM | POA: Diagnosis not present

## 2023-11-28 NOTE — BH Specialist Note (Signed)
 Integrated Behavioral Health via Telemedicine Visit  11/28/2023 [name] [id number]  Number of Integrated Behavioral Health Clinician visits: Additional Visit  Session Start time: 1350   Session End time: 1430  Total time in minutes: 40   Referring Provider: PCP Patient/Family location: Home [facility] Provider location: Office All persons participating in visit: [facility] and Patient Types of Service: Individual psychotherapy  I connected with [name]  via  Telephone   and verified that I am speaking with the correct person using two identifiers. Discussed confidentiality: Yes   I discussed the limitations of telemedicine and the availability of in person appointments.  Discussed there is a possibility of technology failure and discussed alternative modes of communication if that failure occurs.  I discussed that engaging in this telemedicine visit, they consent to the provision of behavioral healthcare and the services will be billed under their insurance.  Patient and/or legal guardian expressed understanding and consented to Telemedicine visit: Yes   Presenting Concerns: Patient and/or family reports the following symptoms/concerns: Today's session was conducted via a HIPAA-compliant telehealth/ telephone platform . During the session, this therapist discussed effective communication strategies with the patient, focusing on managing emotional responses and fostering thoughtful interactions. The patient was guided through drafting a message to an individual with whom she recently experienced conflict. This exercise emphasized clarity, emotional regulation, and avoiding impulsive reactions. The patient demonstrated significant progress by delaying her response to this individual despite feeling upset, showing growth in her ability to manage anger and communicate constructively.  We also discussed the patient's upcoming birthday plans, incorporating strategies to ensure she  remains emotionally grounded during social interactions. This therapist encouraged the patient to continue practicing emotional regulation techniques in daily scenarios and to document her experiences for review in future sessions.  In addition, we identified a pattern of the patient interrupting others during conversations without allowing them to finish their thoughts. This behavior will be addressed in future sessions as part of improving interpersonal communication skills. The goal will be to help the patient develop patience and active listening techniques.  Overall, the patient exhibited tremendous growth in her ability to regulate emotions and communicate effectively. Continued focus on these areas will support her progress toward healthier relationships and improved self-awareness. Patient and/or Family's Strengths/Protective Factors: Social connections   Goals Addressed: Patient will:  Reduce symptoms of: mood instability   Increase knowledge and/or ability of: coping skills   Demonstrate ability to: Increase healthy adjustment to current life circumstances   Progress towards Goals: Ongoing   Interventions: Interventions utilized:  CBT Cognitive Behavioral Therapy       Patient and/or Family Response: Patient agrees to services   Assessment: Patient currently experiencing Depression and emotional eating.    Patient may benefit from ongoing therapy.   Plan: Follow up with behavioral health clinician on : 04/17 Telehealth at 8:45 am   I discussed the assessment and treatment plan with the patient and/or parent/guardian. They were provided an opportunity to ask questions and all were answered. They agreed with the plan and demonstrated an understanding of the instructions.   They were advised to call back or seek an in-person evaluation if the symptoms worsen or if the condition fails to improve as anticipated.   [name], MSW, LCSW-A She/Her Behavioral Health  Clinician [facility]  Internal Medicine Center Direct Dial:[phone]  Fax [phone] Main Office Phone: [phone] [address] [place] [place], [place] [address] Website: [facility] Internal Medicine Center  Primary Care  [place], [place]  [place]

## 2023-12-05 ENCOUNTER — Telehealth (INDEPENDENT_AMBULATORY_CARE_PROVIDER_SITE_OTHER): Payer: Self-pay | Admitting: Licensed Clinical Social Worker

## 2023-12-05 ENCOUNTER — Ambulatory Visit: Admitting: Licensed Clinical Social Worker

## 2023-12-05 DIAGNOSIS — F411 Generalized anxiety disorder: Secondary | ICD-10-CM

## 2023-12-05 NOTE — BH Specialist Note (Signed)
 Patient no-showed today's appointment; appointment was for Telephone visit at 8:45am  Behavioral Health Clinician [facility] from here on out)  attempted patient via Telephone .  [facility] contacted patient from telephone number [phone]. [facility] unable to leave  a  VM.   Patient will need to reschedule appointment by calling Internal medicine center [phone].  Amie Bald, MSW, LCSW-A She/Her Behavioral Health Clinician [facility]  Internal Medicine Center Direct Dial:[phone]  Fax [phone] Main Office Phone: [phone] [address] [place] [place], [place] [address] Website: [facility] Internal Medicine [facility]  [place], [place]  [place]

## 2023-12-05 NOTE — Telephone Encounter (Signed)
 [facility] attempted patient on today for our scheduled session at 8:30 am via telehealth/ telephone. Call unsuccessfull and patients VM isn't set up.   Amie Bald, MSW, LCSW-A She/Her Behavioral Health Clinician [facility]  Internal Medicine Center Direct Dial:[phone]  Fax [phone] Main Office Phone: [phone] [address] [place] [place], [place] [address] Website: [facility] Internal Medicine [facility]  [place], [place]  [place]

## 2023-12-11 ENCOUNTER — Ambulatory Visit: Admitting: Licensed Clinical Social Worker

## 2023-12-11 DIAGNOSIS — F411 Generalized anxiety disorder: Secondary | ICD-10-CM

## 2023-12-11 NOTE — BH Specialist Note (Signed)
 Integrated Behavioral Health via Telemedicine Visit  12/11/2023 [name] [id number]  Number of Integrated Behavioral Health Clinician visits: Additional Visit  Session Start time: 1350   Session End time: 1430  Total time in minutes: 40   Referring Provider: PCP Patient/Family location: Home [facility] Provider location: Office All persons participating in visit: [facility] and Patient Types of Service: Individual psychotherapy   I connected with [name]  via  Telephone   and verified that I am speaking with the correct person using two identifiers. Discussed confidentiality: Yes    I discussed the limitations of telemedicine and the availability of in person appointments.  Discussed there is a possibility of technology failure and discussed alternative modes of communication if that failure occurs.   I discussed that engaging in this telemedicine visit, they consent to the provision of behavioral healthcare and the services will be billed under their insurance.   Patient and/or legal guardian expressed understanding and consented to Telemedicine visit: Yes    Presenting Concerns: Patient and/or family reports the following symptoms/concerns: Today's session was conducted via a HIPAA-compliant telehealth/ telephone platform . During the session, this therapist discussed effective communication strategies with the patient, focusing on managing emotional responses and fostering thoughtful interactions.  Patient presented as overwhelmed and stress die to recent birthday party. Patient spent significant amount of session discussing her party.   Overall, the patient exhibited tremendous growth in her ability to regulate emotions and communicate effectively. Continued focus on these areas will support her progress toward healthier relationships and improved self-awareness. Patient and/or Family's Strengths/Protective Factors: Social connections   Goals Addressed: Patient will:   Reduce symptoms of: mood instability   Increase knowledge and/or ability of: coping skills   Demonstrate ability to: Increase healthy adjustment to current life circumstances   Progress towards Goals: Ongoing   Interventions: Interventions utilized:  CBT Cognitive Behavioral Therapy         Patient and/or Family Response: Patient agrees to services   Assessment: Patient currently experiencing Depression and emotional eating.    Patient may benefit from ongoing therapy.   Plan: Follow up with behavioral health clinician on : 05/01 Telehealth at 8:45 am   I discussed the assessment and treatment plan with the patient and/or parent/guardian. They were provided an opportunity to ask questions and all were answered. They agreed with the plan and demonstrated an understanding of the instructions.   They were advised to call back or seek an in-person evaluation if the symptoms worsen or if the condition fails to improve as anticipated.   Amie Bald, MSW, LCSW-A She/Her Behavioral Health Clinician [facility]  Internal Medicine Center Direct Dial:[phone]  Fax [phone] Main Office Phone: [phone] [address] [place], [place] [address] Website: [facility] Internal Medicine [facility]  [place], [place]  [place]

## 2023-12-17 ENCOUNTER — Other Ambulatory Visit: Payer: Self-pay | Admitting: Student

## 2023-12-17 DIAGNOSIS — F419 Anxiety disorder, unspecified: Secondary | ICD-10-CM

## 2023-12-19 ENCOUNTER — Ambulatory Visit: Admitting: Licensed Clinical Social Worker

## 2023-12-19 DIAGNOSIS — F32A Depression, unspecified: Secondary | ICD-10-CM

## 2023-12-19 DIAGNOSIS — F411 Generalized anxiety disorder: Secondary | ICD-10-CM

## 2023-12-19 NOTE — BH Specialist Note (Signed)
 Integrated Behavioral Health Follow Up In-Person Visit  MRN: [id number] Name: [name]  Number of Integrated Behavioral Health Clinician visits: Additional Visit  Session Start time: 0845   Session End time: 1000  Total time in minutes: 75   Types of Service: Individual psychotherapy   Subjective: [name] is a 63 y.o. female  Patient was referred by PCP for [facility]. Patient reports the following symptoms/concerns: Subjective: Patient presented for a scheduled behavioral health session. She reported continued difficulty with emotional regulation and identified feeling overwhelmed by emotional triggers. The patient shared a recent experience involving a personal event (a party), expressing disappointment and distress related to the outcome. She appeared emotionally impacted while recounting the event.  Objective: The patient was engaged and communicative throughout the session. Affect was congruent with content. No acute safety concerns noted at this time. Insight into emotional experiences appeared emerging.  Assessment: Patient continues to experience difficulty with emotional regulation, particularly in interpersonal and social contexts. Recent events have triggered emotional responses that patient is working to understand and manage more effectively. Themes of disappointment, self-perception, and desire for authenticity were discussed in depth.  Plan:  Continue to provide supportive therapy focused on emotional awareness and regulation.  Patient will reflect on how she wishes to be perceived by others, with the goal of expressing her authentic self rather than leading with anger.  Encourage further exploration of emotional triggers and coping strategies in future sessions.     Objective: Mood: Anxious and Irritable and Affect: Appropriate Risk of harm to self or others: No plan to harm self or others   Life Context: Family and Social: Patient has a  significant support system   Self-Care: Patient enjoys going to the gym and spending time with family and friends.  Life Changes: Patient has ongoing issues with Emotional Eating/ Binge Eating resulting in over eating and weight gain.    Patient and/or Family's Strengths/Protective Factors: Social and Emotional competence and Sense of purpose   Goals Addressed: Patient will:  Reduce symptoms of: anxiety      Progress towards Goals: Ongoing   Interventions: Interventions utilized:  CBT Cognitive Behavioral Therapy     Patient and/or Family Response: Patient agrees to starting medication management.    Patient Centered Plan: Patient is on the following Treatment Plan(s): Medication Management and OPT Assessment: Patient currently experiencing Severe Anxiety .    Patient may benefit from Medication management and OPT.   Plan: Follow up with behavioral health clinician on : [facility] on 05/13 In-Person   Amie Bald, MSW, LCSW-A She/Her Behavioral Health Clinician [facility]  Internal Medicine Center Direct Dial:[phone]  Fax [phone] Main Office Phone: [phone] [address] [place] [place], [place] [address] Website: [facility] Internal Medicine [facility]  [place], [place]  [place]

## 2023-12-31 ENCOUNTER — Other Ambulatory Visit: Payer: Self-pay

## 2023-12-31 ENCOUNTER — Ambulatory Visit (INDEPENDENT_AMBULATORY_CARE_PROVIDER_SITE_OTHER): Admitting: Licensed Clinical Social Worker

## 2023-12-31 ENCOUNTER — Ambulatory Visit: Admitting: Student

## 2023-12-31 ENCOUNTER — Encounter: Payer: Self-pay | Admitting: Student

## 2023-12-31 VITALS — BP 126/73 | HR 64 | Temp 98.1°F | Ht 70.0 in | Wt 240.6 lb

## 2023-12-31 DIAGNOSIS — L987 Excessive and redundant skin and subcutaneous tissue: Secondary | ICD-10-CM | POA: Diagnosis not present

## 2023-12-31 DIAGNOSIS — F32A Depression, unspecified: Secondary | ICD-10-CM

## 2023-12-31 DIAGNOSIS — G43909 Migraine, unspecified, not intractable, without status migrainosus: Secondary | ICD-10-CM

## 2023-12-31 DIAGNOSIS — G43001 Migraine without aura, not intractable, with status migrainosus: Secondary | ICD-10-CM

## 2023-12-31 NOTE — BH Specialist Note (Signed)
 Integrated Behavioral Health via Telemedicine Visit  12/31/2023 DYMPHNA GALESKI [id number]  Number of Integrated Behavioral Health Clinician visits: Additional Visit  Session Start time: 1430   Session End time: 1500 Total time in minutes: 30  Referring Provider: PCP Patient/Family location: Home [facility] Provider location: Office All persons participating in visit: [facility] and Patient Types of Service: Individual psychotherapy   I connected with Cassadi R Gural  via  Telephone   and verified that I am speaking with the correct person using two identifiers. Discussed confidentiality: Yes    I discussed the limitations of telemedicine and the availability of in person appointments.  Discussed there is a possibility of technology failure and discussed alternative modes of communication if that failure occurs.   I discussed that engaging in this telemedicine visit, they consent to the provision of behavioral healthcare and the services will be billed under their insurance.   Patient and/or legal guardian expressed understanding and consented to Telemedicine visit: Yes    Presenting Concerns: Patient and/or family reports the following symptoms/concerns: Today's session was conducted via a HIPAA-compliant telehealth/ telephone platform .Patient discussed feeling more balanced and now taking her anti depressant medication. Patient discussed new goals of going to the gym and discovering her " why". Patient and [facility] discussed ways to plan for the future and envisioning the next two years. Patient agreed to journal and start to learn her likes / dislikes and what excites her. Patient will reflect on herself and find ways to motivate herself as she does others.     Patient and/or Family's Strengths/Protective Factors: Social connections   Goals Addressed: Patient will:  Reduce symptoms of: mood instability   Increase knowledge and/or ability of: coping skills   Demonstrate ability to:  Increase healthy adjustment to current life circumstances   Progress towards Goals: Ongoing   Interventions: Interventions utilized:  CBT Cognitive Behavioral Therapy         Patient and/or Family Response: Patient agrees to services   Assessment: Patient currently experiencing Depression and emotional eating.    Patient may benefit from ongoing therapy.   Plan: Follow up with behavioral health clinician on : 06/05; In-person at 8:45 am   I discussed the assessment and treatment plan with the patient and/or parent/guardian. They were provided an opportunity to ask questions and all were answered. They agreed with the plan and demonstrated an understanding of the instructions.   They were advised to call back or seek an in-person evaluation if the symptoms worsen or if the condition fails to improve as anticipated.   Amie Bald, MSW, LCSW-A She/Her Behavioral Health Clinician [facility]  Internal Medicine Center Direct Dial:[phone]  Fax [phone] Main Office Phone: [phone] [address] [place] [place], [place] [address] Website: [facility] Internal Medicine [facility]  [place], [place]  [place]

## 2023-12-31 NOTE — Progress Notes (Signed)
 CC: Migraine headaches.  HPI:  [name] is a 63 y.o. female with a past medical history of hypertension, migraines, neuropathy who presents for acute visit for concerns of migraines.  Please see assessment and plan for full HPI.  Medications: Reactive airway disease: Albuterol  inhaler Constipation: MiraLAX  Hyperlipidemia: Crestor  20 mg daily Depression: Sertraline  25 mg daily Weight loss: Tirzepatide 15 mg weekly   Past Medical History:  Diagnosis Date   Anemia 2009    Baseline hemoglobin at 11, secondary to iron deficiency   Arthritis    Breast mass 02/2008    Noted on mammogram March 11, 2008 3 cm simple cyst at 9 to 10:00 position of the right breast as well as multiple other smaller simple cyst, 2.6 cm mass at the 10:00 position of the right breast also representing complex cyst,   Deep vein thrombosis (DVT) (HCC)     history of multiple  DVTs in 1989, 1989, 2004, on chronic anticoagulation with Coumadin    Diverticulosis    noted colonoscopy 2010   Eczema    Fibroid uterus 2001    status post total abdominal hysterectomy, right and left salpingo-oophorectomy , done by Dr. 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  Had been on chronic anticoagulation with Coumadin  until 06/2010. Since then the patient stopped taking it.  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    her  biopsy results of the left breast excisional biopsy of mass, phyllodes tumeor with physical borderline features, 2.6 cm, margins negative, fibrocystic change including duct ectasia, fibrosis, apocrine metaplasia and focal calcification     Current Outpatient Medications:    albuterol  (VENTOLIN  HFA) 108 (90 Base) MCG/ACT inhaler, INHALE 1-2 PUFFS BY MOUTH EVERY 6 HOURS AS NEEDED FOR WHEEZE OR SHORTNESS OF BREATH, Disp: 8.5 each, Rfl: 1   Blood Pressure Monitor DEVI, Use to check blood pressure once daily, Disp: 1 each, Rfl: 0   cyclobenzaprine  (FLEXERIL ) 5 MG tablet, TAKE 1 TABLET BY MOUTH AT BEDTIME AS NEEDED FOR MUSCLE SPASMS., Disp: 30 tablet, Rfl: 0   Multiple Vitamin (MULTIVITAMIN) capsule, Take 1 capsule daily by mouth., Disp: , Rfl:    polyethylene glycol (MIRALAX ) 17 g packet, Take 17 g by mouth daily., Disp: 14 each, Rfl: 0   rosuvastatin  (CRESTOR ) 20 MG tablet, TAKE 1 TABLET BY MOUTH EVERY DAY, Disp: 90 tablet, Rfl: 3   sertraline  (ZOLOFT ) 25 MG tablet, TAKE 1 TABLET (25 MG TOTAL) BY MOUTH DAILY., Disp: 30 tablet, Rfl: 0   tirzepatide (MOUNJARO) 15 MG/0.5ML Pen, Inject 15 mg into the skin once a week., Disp: 0.5 mL, Rfl: 3  Review of Systems:    Constitutional: Patient endorses headache.  Physical Exam:  Vitals:   12/31/23 0850  BP: 126/73  Pulse: 64  Temp: 98.1 F (36.7 C)  TempSrc: Oral  SpO2: 99%  Weight: 240 lb 9.6 oz (109.1 kg)  Height: 5\' 10"  (1.778 m)    General: Patient is sitting comfortably in the room  Eyes: Pupils equal and reactive to light.  EOMI Head: Normocephalic, atraumatic  Cardio: Regular rate and rhythm, no murmurs, rubs or gallops Pulmonary: Clear to ausculation bilaterally with no rales, rhonchi, and crackles   Assessment & Plan:   Migraines Patient has a past medical history of migraine headaches.  This was last addressed in 2023.  At that time she was started on sumatriptan .  She is no longer taking this medication.  Overall patient has been  dealing with migraines since she was 63 years old.  She has had intermittent periods where she has had no migraines.  Her recent period before this period of migraines was back in 2023.  Over the past 2 months she has developed migraines again.  She declines any recent stressors that may have triggered this.  She denies any triggers at all.  She describes them as throbbing headaches that get worse with lights.  She also reports rhinorrhea and lacrimation.  She states they last for hours and she has to go lie in a dark room.  Her headaches are positional.  They come on multiple times a day.  I think this is likely a migraine headache versus a cluster or frontal headache.  Does not seem like patient has a lesion that I am worried about.  No focal neurological deficits to think about stroke. She did have an MRI 1 year ago that was negative for any acute findings.  I did discuss treatment options with patient.  Patient does not want to have any medications.  She wants to see if there are any other options.  I stated that we do not do procedures here and can refer you to a specialist.  Patient is okay with not having any medications and will deal with this until she gets to a neurologist.  Plan: - Refer to neurology for potential Botox injections  Excess skin Patient has had weight loss with Mounjaro.  She now has excess skin that she would like removed.  Will refer to plastic surgery.  Plan: - Plastic surgery referral placed  Patient discussed with Dr. Sonia Durand, DO PGY-2 Internal Medicine Resident

## 2023-12-31 NOTE — Assessment & Plan Note (Addendum)
 Patient has a past medical history of migraine headaches.  This was last addressed in 2023.  At that time she was started on sumatriptan .  She is no longer taking this medication.  Overall patient has been dealing with migraines since she was 63 years old.  She has had intermittent periods where she has had no migraines.  Her recent period before this period of migraines was back in 2023.  Over the past 2 months she has developed migraines again.  She declines any recent stressors that may have triggered this.  She denies any triggers at all.  She describes them as throbbing headaches that get worse with lights.  She also reports rhinorrhea and lacrimation.  She states they last for hours and she has to go lie in a dark room.  Her headaches are positional.  They come on multiple times a day.  I think this is likely a migraine headache versus a cluster or frontal headache.  Does not seem like patient has a lesion that I am worried about.  No focal neurological deficits to think about stroke. She did have an MRI 1 year ago that was negative for any acute findings.  I did discuss treatment options with patient.  Patient does not want to have any medications.  She wants to see if there are any other options.  I stated that we do not do procedures here and can refer you to a specialist.  Patient is okay with not having any medications and will deal with this until she gets to a neurologist.  Plan: - Refer to neurology for potential Botox injections

## 2023-12-31 NOTE — Patient Instructions (Signed)
 [name],Thank you for allowing me to take part in your care today.  Here are your instructions.  1.  I have referred you for a neurologist.  Please await phone call to be scheduled.  Thank you, Dr. Lydia Sams  If you have any other questions please contact the internal medicine clinic at [phone] If it is after hours, please call the [place] at [phone] and then ask the person who picks up for the resident on call.

## 2023-12-31 NOTE — Assessment & Plan Note (Signed)
 Patient has had weight loss with Mounjaro.  She now has excess skin that she would like removed.  Will refer to plastic surgery.  Plan: - Plastic surgery referral placed

## 2024-01-07 NOTE — Progress Notes (Signed)
 Internal Medicine Clinic Attending  Case discussed with the resident at the time of the visit.  We reviewed the resident's history and exam and pertinent patient test results.  I agree with the assessment, diagnosis, and plan of care documented in the resident's note.

## 2024-01-12 ENCOUNTER — Other Ambulatory Visit: Payer: Self-pay | Admitting: Student

## 2024-01-12 DIAGNOSIS — F419 Anxiety disorder, unspecified: Secondary | ICD-10-CM

## 2024-01-14 NOTE — Telephone Encounter (Signed)
 Medication sent to pharmacy

## 2024-01-20 ENCOUNTER — Other Ambulatory Visit: Payer: Self-pay

## 2024-01-20 DIAGNOSIS — E782 Mixed hyperlipidemia: Secondary | ICD-10-CM

## 2024-01-20 MED ORDER — ROSUVASTATIN CALCIUM 20 MG PO TABS: 20.0000 mg | ORAL_TABLET | Freq: Every day | ORAL | 3 refills | Status: DC

## 2024-01-20 NOTE — Telephone Encounter (Signed)
 Medication sent to pharmacy

## 2024-01-23 ENCOUNTER — Ambulatory Visit (INDEPENDENT_AMBULATORY_CARE_PROVIDER_SITE_OTHER): Admitting: Licensed Clinical Social Worker

## 2024-01-23 DIAGNOSIS — F411 Generalized anxiety disorder: Secondary | ICD-10-CM

## 2024-01-23 NOTE — BH Specialist Note (Signed)
 Integrated Behavioral Health via Telemedicine Visit  01/23/2024 [name] [id number]  Number of Integrated Behavioral Health Clinician visits: Additional Visit  Session Start time: 0845   Session End time: 0945  Total time in minutes: 60    Referring Provider: PCP Patient/Family location: Home [facility] Provider location: Office All persons participating in visit: [facility] and Patient Types of Service: Telephone visit and Health & Behavioral Assessment/Intervention  I connected with Marnee Sink  via  Telephone  and verified that I am speaking with the correct person using two identifiers. Discussed confidentiality: Yes   I discussed the limitations of telemedicine and the availability of in person appointments.  Discussed there is a possibility of technology failure and discussed alternative modes of communication if that failure occurs.  I discussed that engaging in this telemedicine visit, they consent to the provision of behavioral healthcare and the services will be billed under their insurance.  Patient and/or legal guardian expressed understanding and consented to Telemedicine visit: Yes   Presenting Concerns: Patient and/or family reports the following symptoms/concerns: Patient participated in a scheduled behavioral health session via telehealth. Patient reported starting Zoloft  25 mg and described significant improvement in both sleep quality and overall mood. Patient initially expressed hesitation about starting the medication. [facility] normalized the patient's concerns and provided psychoeducation, using the metaphor: "Think of this like using glasses. It doesn't change who you are--it helps you see more clearly. Medication can help you think more clearly and respond more calmly," which the patient reported was comforting and validating.  [facility] reintroduced the STOP skill (Stop, Take a breath, Observe, Proceed) as a coping strategy for patient to practice between sessions to  support emotional regulation.  The majority of the session was spent discussing the Stages of Change model. Patient reflected on her sense of identity through the decades, sharing insights about who she was in her 54s, 48s, and now at age 63. Patient reported feeling somewhat disconnected from her current self and expressed uncertainty about progression in this stage of life. She identified with the Preparation stage and acknowledged a desire to move toward greater emotional alignment and intentional living.  Session concluded with the assignment for patient to journal about what still matters to her at this stage in life as a way to support deeper self-awareness and values clarification. Patient was engaged, thoughtful, and receptive throughout session. Follow-up scheduled.  Duration of problem: More than 6 months; Severity of problem: moderate  Patient and/or Family's Strengths/Protective Factors: Sense of purpose  Goals Addressed: Patient will:  Reduce symptoms of: mood instability   Increase knowledge and/or ability of: coping skills   Demonstrate ability to: Increase healthy adjustment to current life circumstances  Progress towards Goals: Ongoing    Interventions: Interventions utilized:  CBT Cognitive Behavioral Therapy Standardized Assessments completed: Not Needed    Patient and/or Family Response: Patient advised medication for anxiety and individual counseling is significantly decreasing her anxiety.   Clinical Assessment/Diagnosis  Generalized anxiety disorder    Assessment: Patient currently experiencing Anxiety and Stages of Change.   Patient may benefit from supportive therapy to explore identity development in later life stages, enhance emotional insight, and strengthen motivation aligned with the Preparation stage of change. Ongoing psychoeducation regarding medication management, along with structured journaling and skill reinforcement  Plan: Follow up with  behavioral health clinician on : 07/10; In-person at 8:45 am   I discussed the assessment and treatment plan with the patient and/or parent/guardian. They were provided an opportunity to  ask questions and all were answered. They agreed with the plan and demonstrated an understanding of the instructions.   They were advised to call back or seek an in-person evaluation if the symptoms worsen or if the condition fails to improve as anticipated.  Amie Bald, MSW, LCSW-A She/Her Behavioral Health Clinician [facility]  Internal Medicine Center Direct Dial:[phone]  Fax [phone] Main Office Phone: [phone] [address] [place] [place], [place] [address] Website: [facility] Internal Medicine [facility]  [place], [place]  [place]

## 2024-01-27 ENCOUNTER — Encounter: Payer: Self-pay | Admitting: Student

## 2024-01-31 ENCOUNTER — Telehealth: Payer: Self-pay | Admitting: *Deleted

## 2024-02-27 ENCOUNTER — Ambulatory Visit (INDEPENDENT_AMBULATORY_CARE_PROVIDER_SITE_OTHER): Admitting: Licensed Clinical Social Worker

## 2024-02-27 DIAGNOSIS — F411 Generalized anxiety disorder: Secondary | ICD-10-CM

## 2024-02-27 DIAGNOSIS — F32A Depression, unspecified: Secondary | ICD-10-CM

## 2024-02-27 NOTE — BH Specialist Note (Signed)
 Integrated Behavioral Health Follow Up In-Person Visit  MRN: [id number] Name: [name]  Number of Integrated Behavioral Health Clinician visits: Additional Visit  Session Start time: 0845   Session End time: 0945  Total time in minutes: 60    Types of Service: Individual psychotherapy and General Behavioral Integrated Care (BHI)  Interpretor:No. Interpretor Name and Language: N/A  Subjective: [name] is a 63 y.o. female   Patient was referred by PCP for Counseling. Patient reports the following symptoms/concerns:   S: Subjective Client participated in session via telehealth and appeared well-rested, focused, and emotionally present. Client shared that her sleep has improved and that the current dose of Zoloft  is helping stabilize her mood. She reported feeling "okay" with the dosage and did not express any current concerns related to medication. Client acknowledged feeling like she's "not where I should be," expressing a mix of motivation and self-reflection. She also recognized that certain media or news content can trigger emotional responses and stated she is learning to identify these patterns more quickly. Client reported ongoing commitment to physical activity, including preparing for a marathon in November, and expressed pride in establishing more control over personal habits.  Client noted emotional support from her son, stating, "I've given him that role." Therapist explored this dynamic further by asking, "Why have you given him the role?" Client responded, "90% he has our best interest." Therapist gently challenged and encouraged client to reflect on the meaning and boundaries of support within family relationships.  O: Objective Client presented with clear speech, stable mood, and goal-oriented focus. She appeared well-groomed and emotionally regulated throughout the session. Client responded insightfully to therapeutic prompts and demonstrated openness to  feedback and reflection. No SI, HI, AH, or VH reported. Client demonstrated improved sleep hygiene, medication adherence, and motivation for continued progress.  A: Assessment Client continues to make progress in managing anxiety and improving mood stability. Sleep has improved, and client appears to be tolerating Zoloft  well. She is demonstrating increased insight into emotional triggers and behavioral patterns. Physical activity and personal goals are positively contributing to emotional regulation and routine. The client continues to examine interpersonal boundaries and roles within her support system, particularly with her son. Motivation for change remains strong, and client is in the preparation/action stage across multiple areas of life.    P: Plan  Continue to monitor response to Zoloft  and emotional regulation.  Reinforce emotional boundary work and support systems within family roles.  Support client's preparation for upcoming marathon and physical wellness goals.  Discuss impact of media and social triggers, and support strategies for intentional exposure.  Review stages of change and support progress through sustained action.  Client assigned My Next 10 Years: Vision & Intentions Worksheet to explore long-term goals, values, and areas for continued growth.  Continue to build therapeutic momentum with focus on self-compassion, identity exploration, and future orientation.   Duration of problem: More than 12 months; Severity of problem: mild  Objective: Mood: Anxious and Depressed and Affect: Appropriate Risk of harm to self or others: No plan to harm self or others  Life Context: Family and Social: Client reports adequate support system. School/Work: Client is unemployed Self-Care: Client reports fitness and an upcoming marathon in November Life Changes: Client adjusting to stages of change  Patient and/or Family's Strengths/Protective Factors: Social  connections  Goals Addressed: Patient will:  Reduce symptoms of: anxiety and depression   Increase knowledge and/or ability of: coping skills   Demonstrate ability to: Increase  healthy adjustment to current life circumstances  Progress towards Goals: Ongoing  Interventions: Interventions utilized:  Motivational Interviewing, CBT Cognitive Behavioral Therapy, and Supportive Counseling Standardized Assessments completed: Not Needed      Patient and/or Family Response: Client agrees to treatment plan  Patient Centered Plan: Patient is on the following Treatment Plan(s): Continue to monitor response to Zoloft  and emotional regulation.  Reinforce emotional boundary work and support systems within family roles.  Support client's preparation for upcoming marathon and physical wellness goals.  Discuss impact of media and social triggers, and support strategies for intentional exposure.  Review stages of change and support progress through sustained action.  Client assigned My Next 10 Years: Vision & Intentions Worksheet to explore long-term goals, values, and areas for continued growth.  Continue to build therapeutic momentum with focus on self-compassion, identity exploration, and future orientation.    Clinical Assessment/Diagnosis  Generalized anxiety disorder  Depression, unspecified depression type    Assessment: Patient currently experiencing  Anxiety and Depression.   Patient may benefit from participated in [facility].  Plan: Follow up with behavioral health clinician on : 02/11/2024   Renda Pontes, MSW, LCSW-A She/Her Behavioral Health Clinician [facility]  Internal Medicine Center

## 2024-02-27 NOTE — BH Specialist Note (Deleted)
 Sleeping better. Doing well with Zoloft  and ok with dosage. Feeling like I'm not where I should beRecognizing triggers with news. Marathon in November nd working out .   Controlling habits  Son supports    [place] giving him that roll why have you given him the role? 90% he has our best interest. Why?   They look like.

## 2024-03-12 ENCOUNTER — Ambulatory Visit: Admitting: Licensed Clinical Social Worker

## 2024-03-12 DIAGNOSIS — F411 Generalized anxiety disorder: Secondary | ICD-10-CM

## 2024-03-12 NOTE — BH Specialist Note (Signed)
 Patient no-showed on today for 8:45 am appointment in-person at [facility] with Behavioral Health Counselor [facility]). As a courtesy, [facility] changed appointment to telephone/ telehealth and attempted patient. Call went straight to answering machine and VM was not set up.  Patient will need to contact agency back to reschedule appointment.   Renda Pontes, MSW, LCSW-A She/Her Behavioral Health Clinician [facility]  Internal Medicine Center

## 2024-03-23 ENCOUNTER — Other Ambulatory Visit: Payer: Self-pay

## 2024-03-23 MED ORDER — TIRZEPATIDE 15 MG/0.5ML ~~LOC~~ SOAJ
15.0000 mg | SUBCUTANEOUS | 3 refills | Status: DC
Start: 2024-03-23 — End: 2024-05-04

## 2024-03-23 NOTE — Telephone Encounter (Signed)
 Medication sent to pharmacy

## 2024-04-01 ENCOUNTER — Telehealth: Payer: Self-pay | Admitting: Dietician

## 2024-04-01 NOTE — Telephone Encounter (Signed)
Tried calling this patient. Their voicemail box is not set up. I was unable to leave a message  

## 2024-04-08 NOTE — Telephone Encounter (Signed)
 Tried calling this patient. Their voicemail box is not set up. I was unable to leave a message will mail her a letter.

## 2024-04-15 ENCOUNTER — Other Ambulatory Visit: Payer: Self-pay | Admitting: Internal Medicine

## 2024-04-15 ENCOUNTER — Telehealth: Payer: Self-pay | Admitting: *Deleted

## 2024-04-15 DIAGNOSIS — Z1231 Encounter for screening mammogram for malignant neoplasm of breast: Secondary | ICD-10-CM

## 2024-04-15 NOTE — Telephone Encounter (Signed)
 Mammogram appointment January 8.2026 @ 9:00 am [name] 8:45 am. Appointment mailed to patient attached with information regarding the $75.00 no show fee.

## 2024-04-21 ENCOUNTER — Institutional Professional Consult (permissible substitution): Admitting: Licensed Clinical Social Worker

## 2024-04-23 ENCOUNTER — Ambulatory Visit (INDEPENDENT_AMBULATORY_CARE_PROVIDER_SITE_OTHER): Admitting: Licensed Clinical Social Worker

## 2024-04-23 DIAGNOSIS — F411 Generalized anxiety disorder: Secondary | ICD-10-CM

## 2024-04-23 NOTE — Telephone Encounter (Unsigned)
 Copied from CRM #[id number]. Topic: General - Running Late >> Apr 23, 2024  3:04 PM Mercer PEDLAR wrote: Patient/patient representative is calling because they are running late for an appointment.

## 2024-05-04 ENCOUNTER — Other Ambulatory Visit: Payer: Self-pay

## 2024-05-04 DIAGNOSIS — E782 Mixed hyperlipidemia: Secondary | ICD-10-CM

## 2024-05-04 DIAGNOSIS — F419 Anxiety disorder, unspecified: Secondary | ICD-10-CM

## 2024-05-04 MED ORDER — TIRZEPATIDE 15 MG/0.5ML ~~LOC~~ SOAJ: 15.0000 mg | SUBCUTANEOUS | 3 refills | Status: DC

## 2024-05-04 MED ORDER — ROSUVASTATIN CALCIUM 20 MG PO TABS: 20.0000 mg | ORAL_TABLET | Freq: Every day | ORAL | 3 refills | Status: AC

## 2024-05-04 MED ORDER — SERTRALINE HCL 25 MG PO TABS: 25.0000 mg | ORAL_TABLET | Freq: Every day | ORAL | 1 refills | Status: DC

## 2024-05-11 ENCOUNTER — Telehealth: Payer: Self-pay | Admitting: Neurology

## 2024-05-11 NOTE — Telephone Encounter (Signed)
 MY[facility] conf

## 2024-05-12 ENCOUNTER — Encounter: Payer: Self-pay | Admitting: Neurology

## 2024-05-12 ENCOUNTER — Ambulatory Visit: Admitting: Neurology

## 2024-05-17 NOTE — BH Specialist Note (Signed)
 Integrated Behavioral Health Follow Up In-Person Visit  MRN: [id number] Name: [name]  Number of Integrated Behavioral Health Clinician visits: Additional Visit  Session Start time: 1500   Session End time: 1600  Total time in minutes: 60    Types of Service: Individual psychotherapy and General Behavioral Integrated Care (BHI)  Interpretor:No. Interpretor Name and Language: N/A  Subjective: [name] is a 63 y.o. female  Patient was referred by PCP for Counseling.  Patient reports the following symptoms/concerns:   Client participated in session in person and appeared well-rested, focused, and emotionally present. She shared that her sleep has improved and that the current dose of Zoloft  is helping stabilize her mood. Client reported feeling "okay" with the dosage and did not express any concerns related to medication at this time.  Client acknowledged feeling like she's "not where I should be," reflecting a mix of motivation and self-awareness. She identified that certain media or news content can be emotionally triggering and noted progress in recognizing these patterns more quickly, allowing her to set healthier boundaries. She expressed continued commitment to physical activity, including training for a marathon in November, and conveyed a sense of pride in having more control over her personal habits.   Objective: Mood: NA and Affect: Appropriate Risk of harm to self or others: No plan to harm self or others    Interventions: Interventions utilized:  CBT Cognitive Behavioral Therapy Standardized Assessments completed: Patient declined screening       Clinical Assessment/Diagnosis  Generalized anxiety disorder    Renda Pontes, MSW, LCSW-A She/Her Behavioral Health Clinician [facility]  Internal Medicine Center Direct Dial:[phone]  Fax [phone] Main Office Phone: [phone] [address] [place] [place], [place] [address] Website:  [facility] Internal Medicine [facility]  [place], [place]  [place]

## 2024-05-26 ENCOUNTER — Ambulatory Visit: Admitting: Licensed Clinical Social Worker

## 2024-05-26 DIAGNOSIS — F411 Generalized anxiety disorder: Secondary | ICD-10-CM

## 2024-05-26 NOTE — BH Specialist Note (Signed)
 Integrated Behavioral Health Follow Up In-Person Visit  MRN: [id number] Name: [name]  Number of Integrated Behavioral Health Clinician visits: Additional Visit  Session Start time: 0900   Session End time: 1000  Total time in minutes: 60    Types of Service: Individual psychotherapy and General Behavioral Integrated Care (BHI)  Interpretor:No. Interpretor Name and Language: N/A  Subjective: [name] is a 63 y.o. female  Patient was referred by aPCP for Counseling. Patient reports the following symptoms/concerns: Client is a 63 year old African American woman who attended today's session via HIPAA-compliant telehealth (audio only). Client's identity and date of birth were verified at the start of the session. She was engaged, present, and demonstrated continued insight into her emotional and behavioral patterns.  During the session, client discussed her experiences with aging and her ongoing effort to reduce judgmental thinking, both toward herself and others. Psychoeducation was provided regarding mindfulness and cognitive awareness, specifically introducing the technique of "catching the judgment thought." Client was receptive to the intervention and expressed willingness to practice this strategy between sessions.  Client continues to prioritize self-care, reporting regular physical activity at the gym and consistent quality time with family. These activities remain protective and supportive of her overall mental and emotional health. Client shows strong self-awareness and continues to make significant progress in therapy. She has demonstrated notable growth, particularly in her ability to reflect, implement coping strategies, and proactively engage in her own wellness.  Plan is to continue building on cognitive reframing techniques and support her ongoing self-care and emotional regulation practices. Client is motivated, engaged, and doing an excellent job in her  therapeutic work. Duration of problem: More than a year; Severity of problem: mild  Objective: Mood: Anxious and Affect: Appropriate Risk of harm to self or others: No plan to harm self or others    Patient and/or Family's Strengths/Protective Factors: Social connections  Goals Addressed: Patient will:  Reduce symptoms of: compulsions   Progress towards Goals: Ongoing  Interventions: Interventions utilized:  CBT Cognitive Behavioral Therapy Standardized Assessments completed: Patient declined screening      Patient and/or Family Response: Patient agrees to ongoing treatment and reports counseling being effective.  Patient Centered Plan: Patient is on the following Treatment Plan(s): Bi - weekly OPT  Clinical Assessment/Diagnosis  Generalized anxiety disorder    Assessment: Patient currently experiencing Anxiety.   Patient may benefit from Ongoing OPT.  Plan: Follow up with behavioral health clinician on : 06/11/24 In person at 8:45am  Renda Pontes, MSW, LCSW-A She/Her Behavioral Health Clinician [facility]  Internal Medicine Center

## 2024-05-30 ENCOUNTER — Other Ambulatory Visit: Payer: Self-pay | Admitting: Student

## 2024-06-09 ENCOUNTER — Ambulatory Visit: Admitting: Licensed Clinical Social Worker

## 2024-06-11 ENCOUNTER — Ambulatory Visit: Admitting: Licensed Clinical Social Worker

## 2024-06-25 ENCOUNTER — Ambulatory Visit (INDEPENDENT_AMBULATORY_CARE_PROVIDER_SITE_OTHER): Admitting: Licensed Clinical Social Worker

## 2024-06-25 DIAGNOSIS — F411 Generalized anxiety disorder: Secondary | ICD-10-CM | POA: Diagnosis not present

## 2024-07-02 NOTE — BH Specialist Note (Deleted)
 Integrated Behavioral Health Follow Up In-Person Visit  MRN: [id number] Name: CAMIYAH FRIBERG  Number of Integrated Behavioral Health Clinician visits: Additional Visit  Session Start time: 0845   Session End time: 0945  Total time in minutes: 60    Types of Service: {CHL AMB TYPE OF SERVICE:[phone]}  Interpretor:{yes wn:[id number]} Interpretor Name and Language: ***  Subjective: KELLSEY SANSONE is a 63 y.o. female accompanied by {Patient accompanied by:[phone]} Patient was referred by *** for ***. Patient reports the following symptoms/concerns: *** Duration of problem: ***; Severity of problem: {Mild/Moderate/Severe:20260}  Objective: Mood: {BHH MOOD:22306} and Affect: {BHH AFFECT:22307} Risk of harm to self or others: {CHL AMB BH Suicide Current Mental Status:21022748}  Life Context: Family and Social: *** School/Work: *** Self-Care: *** Life Changes: ***  Patient and/or Family's Strengths/Protective Factors: {CHL AMB BH PROTECTIVE [phone]}  Goals Addressed: Patient will:  Reduce symptoms of: {IBH Symptoms:21014056}   Increase knowledge and/or ability of: {IBH Patient Tools:21014057}   Demonstrate ability to: {IBH Goals:21014053}  Progress towards Goals: {CHL AMB BH PROGRESS TOWARDS GOALS:[phone]}  Interventions: Interventions utilized:  {IBH Interventions:21014054} Standardized Assessments completed: {IBH Screening Tools:21014051}      Patient and/or Family Response: ***  Patient Centered Plan: Patient is on the following Treatment Plan(s): ***  Clinical Assessment/Diagnosis  No diagnosis found.    Assessment: Patient currently experiencing ***.   Patient may benefit from ***.  Plan: Follow up with behavioral health clinician on : *** Behavioral recommendations: *** Referral(s): {IBH Referrals:21014055}  Renda Vella Pontes

## 2024-07-02 NOTE — BH Specialist Note (Addendum)
"     Integrated Behavioral Health via Telemedicine Visit  06/25/2024 [name] [id number]  Number of Integrated Behavioral Health Clinician visits: Additional Visit  Session Start time: 0845   Session End time: 0945  Total time in minutes: 60   Types of Service: Individual psychotherapy and General Behavioral Integrated Care (BHI)   Interpretor:No. Interpretor Name and Language: N/A   Subjective: [name] is a 63 y.o. female  Patient was referred by PCP for Counseling. Session was held via telephone only. Identity verified and patient agreed to telehealth services. Patient reported being at home and therapist was in office.   Patient reports the following symptoms/concerns: The client will continue to build on cognitive reframing and mindfulness strategies to reduce judgmental thinking. Short-term goals are: (1) Client will practice the catch the judgment thought mindfulness technique at least three times per week and document experiences to discuss in each session. (2) Client will maintain regular physical activity at the gym, with a minimum of two sessions weekly, as self-reported each follow-up. (3) Client will increase quality time with family, aiming for at least one family engagement activity per week, and use brief journaling to reflect on positive experiences and emotional responses. Plan is to support and reinforce cognitive techniques and self-care habits, with continued encouragement for proactive wellness and self-reflection.  Duration of problem: More than a year; Severity of problem: mild   Objective: Mood: Anxious and Affect: Appropriate Risk of harm to self or others: No plan to harm self or others       Patient and/or Family's Strengths/Protective Factors: Social connections   Goals Addressed: Patient will:  Reduce symptoms of: compulsions    Progress towards Goals: Ongoing   Interventions: Interventions utilized:  CBT Cognitive Behavioral  Therapy Standardized Assessments completed: Patient declined screening           Patient and/or Family Response: Patient agrees to ongoing treatment and reports counseling being effective.   Patient Centered Plan: Patient is on the following Treatment Plan(s): Bi - weekly OPT   Clinical Assessment/Diagnosis   Generalized anxiety disorder      Assessment: Patient currently experiencing Anxiety.    Patient may benefit from Ongoing OPT.   Plan: Follow up with behavioral health clinician on : Patient will contact agency to schedule follow up   [name], MSW, LCSW-A She/Her Behavioral Health Clinician [facility]  Internal Medicine Center "

## 2024-07-09 NOTE — Patient Instructions (Signed)
 [name]

## 2024-07-16 ENCOUNTER — Other Ambulatory Visit: Payer: Self-pay | Admitting: Student

## 2024-07-16 DIAGNOSIS — F419 Anxiety disorder, unspecified: Secondary | ICD-10-CM

## 2024-07-22 NOTE — Telephone Encounter (Signed)
 Spoke with the pt who is aware that no appts are ava with Renda and to call back in Jan.  Copied from CRM #[id number]. Topic: Appointments - Scheduling Inquiry for Clinic >> Jul 22, 2024  8:56 AM Graeme ORN wrote: Reason for CRM: Patient called to make appt with [facility] for this month. Let her know schedule not available. She would like a call once she is able to get scheduled. Thank You

## 2024-08-10 ENCOUNTER — Ambulatory Visit: Payer: Self-pay

## 2024-08-10 VITALS — BP 138/80 | HR 66 | Temp 98.0°F | Ht 70.0 in | Wt 228.8 lb

## 2024-08-10 DIAGNOSIS — G4700 Insomnia, unspecified: Secondary | ICD-10-CM

## 2024-08-10 DIAGNOSIS — Z79899 Other long term (current) drug therapy: Secondary | ICD-10-CM

## 2024-08-10 DIAGNOSIS — F5101 Primary insomnia: Secondary | ICD-10-CM

## 2024-08-10 DIAGNOSIS — E785 Hyperlipidemia, unspecified: Secondary | ICD-10-CM

## 2024-08-10 DIAGNOSIS — R5383 Other fatigue: Secondary | ICD-10-CM

## 2024-08-10 NOTE — Progress Notes (Signed)
 "  Patient name: [name] Date of birth: Jan 01, 1961 Date of visit: 08/12/2024  Type of visit: Acute Office Visit   Subjective   Chief concern:  Chief Complaint  Patient presents with   Fatigue    No energy for about a month, not resting well at night    [name] is a 63 y.o. female with a history of HTN, MDD/GAD, obesity, HLD  who presents to [facility] clinic for evaluation of fatigue.  Patient presents to clinic today due to concerns of increased daytime fatigue for the past month.  She states that she has not been sleeping well, and cannot recall the last time she had a good night of sleep.  When asked about her bedtime routine, she reports that she is typically in bed by about 7 PM, and will generally stay up on her phone or watch TV, and will fall asleep by around 10 PM.  She then will usually wake up around 11:30 PM, and might doze off again before waking up and staying awake between 2 to 5 AM, and then she may or may not fall back asleep early in the morning.  She reports that she has tried melatonin 10 mg in the past for her sleep, though this did not help.  She does have a history of MDD and GAD and sees Renda Pontes for CBT, as well as takes Zoloft  25 mg daily.  She reports that her symptoms are much improved from before prior to her taking Zoloft , though she does endorse feeling like her mind is sometimes racing at night. She has declined PHQ-9 and GAD-7 today.  She reports that she does not wish to increase her Zoloft .  ROS: Denies headaches, dizziness, fever, chills, runny nose, sore throat, vision changes, hearing changes, chest pain, shortness of breath, difficulty breathing, nausea, vomiting, abdominal pain. Denies increased urinary frequency, pain with urination, constipation or diarrhea. No recent falls.   Patient Active Problem List   Diagnosis Date Noted   Excess skin 12/31/2023   Generalized anxiety disorder 11/21/2023   Binge eating disorder 12/08/2022    Migraines 05/01/2022   Limb cramps 03/30/2021   Depression 05/16/2018   Neuropathy 01/28/2018   Obesity, morbid (HCC) 09/28/2016   Dyslipidemia 01/27/2014   Eczema 05/12/2013   Insomnia 05/12/2013   Diverticulosis 04/18/2012   Upper airway cough syndrome 12/03/2006   Essential hypertension 05/29/2006     Past Surgical History:  Procedure Laterality Date   ABDOMINAL HYSTERECTOMY  04/13/2010   done by Dr. 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 Lorriane     Current Outpatient Medications  Medication Instructions   albuterol  (VENTOLIN  HFA) 108 (90 Base) MCG/ACT inhaler INHALE 1-2 PUFFS BY MOUTH EVERY 6 HOURS AS NEEDED FOR WHEEZE OR SHORTNESS OF BREATH   Blood Pressure Monitor DEVI Use to check blood pressure once daily   cyclobenzaprine  (FLEXERIL ) 5 MG tablet TAKE 1 TABLET BY MOUTH AT BEDTIME AS NEEDED FOR MUSCLE SPASMS.   MOUNJARO  15 MG/0.5ML Pen INJECT THE CONTENTS OF ONE PEN  SUBCUTANEOUSLY WEEKLY AS  DIRECTED   Multiple Vitamin (MULTIVITAMIN) capsule 1 capsule,  Oral, Daily   polyethylene glycol (MIRALAX ) 17 g, Oral, Daily   rosuvastatin  (CRESTOR ) 20 mg, Oral, Daily   sertraline  (ZOLOFT ) 25 mg, Oral, Daily    Social History[1]    Objective  Today's Vitals   08/10/24 0943  BP: 138/80  Pulse: 66  Temp: 98 F (36.7 C)  TempSrc: Oral  SpO2: 100%  Weight: 228 lb 12.8 oz (103.8 kg)  Height: 5' 10 (1.778 m)  Body mass index is 32.83 kg/m.   Physical Exam:   Constitutional: well-appearing female sitting in exam chair, in no acute distress. Ambulates without use of  assistance device HEENT: normocephalic atraumatic, mucous membranes moist Eyes: conjunctiva non-erythematous Neck: supple Cardiovascular: regular rate and rhythm, bilateral radial pulses 2+, bilateral dorsal pedal pulses 2+, brisk capillary refill bilateral feet and hands  Pulmonary/Chest: normal work of breathing on room air, lungs clear to auscultation bilaterally Abdominal: soft, non-tender, non-distended MSK: normal bulk and tone. Neurological: alert & oriented x 3 Skin: warm and dry Psych: mood calm, behavior normal, thought content normal, judgement normal      The 10-year ASCVD risk score (Arnett DK, et al., 2019) is: 8.4%   Values used to calculate the score:     Age: 7 years     Clinically relevant sex: Female     Is Non-Hispanic African American: Yes     Diabetic: No     Tobacco smoker: No     Systolic Blood Pressure: 138 mmHg     Is BP treated: No     HDL Cholesterol: 61 mg/dL     Total Cholesterol: 180 mg/dL      Assessment & Plan  Problem List Items Addressed This Visit     Insomnia   Patient presents to clinic with concerns of insomnia.  She has been sleeping roughly 3 hours total at night, and cannot remember the last time she had a good 8 hours of sleep.  We discussed extensively working on good sleep hygiene, and recommendations including reduction and avoidance of screen time for at least 1 hour prior to sleep, avoidance of the TV before or during sleep, use of white noise as opposed to TV for background noise.  She has tried melatonin 10 mg in the past for sleep, though this did not help.  I do question if there is a component of her MDD/GAD, though she declines PHQ-9 and GAD-7 today, and has no desire to increase her Zoloft  though she does report feeling like her mind is racing and she has a lot on her mind when she does wake up.  I have asked that when she comes in 1 month that we try to complete this.  We discussed that there is no cure for insomnia, though  rewiring our relationship with sleep is the best option through CBT, and she is linked with Renda Pontes already.  We discussed that when she resumes her sessions with Renda that they discuss her insomnia. STOP BANG of 3 indicating high risk for OSA.  She was previously referred for a sleep study in the past, though did not have this completed and I think it is reasonable given her insomnia and obesity that we complete this.  Given her generalized daytime fatigue, it is also reasonable to assess for anemia, thyroid  function, iron studies and any vitamin deficiencies.  I like for her to follow-up in about 1 month for her routine annual follow-up and to address health maintenance care gaps, and at that time can check  again in about her sleep hygiene and any changes. - Referral for Sleep Study - f/u CBC - f/u iron studies - f/u TSH - f/u Vitamin B12 - f/u Vitamin D       Relevant Orders   Ambulatory referral to Sleep Studies   Dyslipidemia   Patient has a history of dyslipidemia, and has not had her cholesterol checked in over a year and a half.  Last LDL from 11/2022 was 92.  She was previously hospitalized with a concern for TIA, making her LDL goal < 70.  We will obtain lipid panel today and if still not at goal then may increase rosuvastatin  20 mg daily to 40 mg daily. - Continue Rosuvastatin  20 mg daily  - f/u lipid panel; if LDL not at goal < 70, discuss increasing to Rosuvastatin  40 mg daily       Relevant Orders   Lipid Profile (Completed)   Other Visit Diagnoses       Other fatigue    -  Primary   Relevant Orders   CBC with Diff (Completed)   TSH (Completed)   Vitamin D  (25 hydroxy) (Completed)   Vitamin B12 (Completed)   Iron, TIBC and Ferritin Panel (Completed)       Return in about 1 month (around 09/10/2024) for annual + insomnia check-in .  Patient discussed with Dr. Lovie, who also saw and evaluated the patient.  Doyal Miyamoto, MD [facility]  PGY-1 08/12/2024,  8:43 AM      [1]  Social History Tobacco Use   Smoking status: Former    Types: Cigarettes   Smokeless tobacco: Never  Vaping Use   Vaping status: Never Used  Substance Use Topics   Alcohol use: Yes    Comment: rare   Drug use: No   "

## 2024-08-10 NOTE — Patient Instructions (Signed)
 Thank you, [name] for allowing us  to provide your care today. Today we discussed the following:  - We discussed sleep hygiene recommendations: turning off screens and stimulating things like the TV at least an hour before attempting to go to sleep; try rain or meditation sounds for white noise, limiting light coming through the room - We will order a Sleep Study to be completed to make sure you don't have sleep apnea  - I will call or MyChart you with your lab results   I have ordered the following labs for you:  Lab Orders         CBC with Diff         TSH         Vitamin D  (25 hydroxy)         Vitamin B12         Iron, TIBC and Ferritin Panel         Lipid Profile       Referrals ordered today:   Referral Orders         Ambulatory referral to Sleep Studies       Follow up: 1 month    Should you have any questions or concerns please call the Internal Medicine Clinic at [phone].     Doyal Miyamoto, MD [facility]

## 2024-08-11 LAB — VITAMIN D 25 HYDROXY (VIT D DEFICIENCY, FRACTURES): Vit D, 25-Hydroxy: 24.4 ng/mL — ABNORMAL LOW (ref 30.0–100.0)

## 2024-08-11 LAB — CBC WITH DIFFERENTIAL/PLATELET
Basophils Absolute: 0 x10E3/uL (ref 0.0–0.2)
Basos: 0 %
EOS (ABSOLUTE): 0 x10E3/uL (ref 0.0–0.4)
Eos: 1 %
Hematocrit: 38.6 % (ref 34.0–46.6)
Hemoglobin: 12.7 g/dL (ref 11.1–15.9)
Immature Grans (Abs): 0 x10E3/uL (ref 0.0–0.1)
Immature Granulocytes: 0 %
Lymphocytes Absolute: 2.3 x10E3/uL (ref 0.7–3.1)
Lymphs: 51 %
MCH: 29.3 pg (ref 26.6–33.0)
MCHC: 32.9 g/dL (ref 31.5–35.7)
MCV: 89 fL (ref 79–97)
Monocytes Absolute: 0.3 x10E3/uL (ref 0.1–0.9)
Monocytes: 7 %
Neutrophils Absolute: 1.8 x10E3/uL (ref 1.4–7.0)
Neutrophils: 41 %
Platelets: 266 x10E3/uL (ref 150–450)
RBC: 4.33 x10E6/uL (ref 3.77–5.28)
RDW: 13.3 % (ref 11.7–15.4)
WBC: 4.5 x10E3/uL (ref 3.4–10.8)

## 2024-08-11 LAB — LIPID PANEL
Chol/HDL Ratio: 3 ratio (ref 0.0–4.4)
Cholesterol, Total: 180 mg/dL (ref 100–199)
HDL: 61 mg/dL
LDL Chol Calc (NIH): 108 mg/dL — ABNORMAL HIGH (ref 0–99)
Triglycerides: 58 mg/dL (ref 0–149)
VLDL Cholesterol Cal: 11 mg/dL (ref 5–40)

## 2024-08-11 LAB — VITAMIN B12: Vitamin B-12: 308 pg/mL (ref 232–1245)

## 2024-08-11 LAB — IRON,TIBC AND FERRITIN PANEL
Ferritin: 173 ng/mL — ABNORMAL HIGH (ref 15–150)
Iron Saturation: 27 % (ref 15–55)
Iron: 76 ug/dL (ref 27–139)
Total Iron Binding Capacity: 280 ug/dL (ref 250–450)
UIBC: 204 ug/dL (ref 118–369)

## 2024-08-11 LAB — TSH: TSH: 0.806 u[IU]/mL (ref 0.450–4.500)

## 2024-08-12 ENCOUNTER — Ambulatory Visit: Payer: Self-pay

## 2024-08-12 NOTE — Assessment & Plan Note (Addendum)
 Patient presents to clinic with concerns of insomnia.  She has been sleeping roughly 3 hours total at night, and cannot remember the last time she had a good 8 hours of sleep.  We discussed extensively working on good sleep hygiene, and recommendations including reduction and avoidance of screen time for at least 1 hour prior to sleep, avoidance of the TV before or during sleep, use of white noise as opposed to TV for background noise.  She has tried melatonin 10 mg in the past for sleep, though this did not help.  I do question if there is a component of her MDD/GAD, though she declines PHQ-9 and GAD-7 today, and has no desire to increase her Zoloft  though she does report feeling like her mind is racing and she has a lot on her mind when she does wake up.  I have asked that when she comes in 1 month that we try to complete this.  We discussed that there is no cure for insomnia, though rewiring our relationship with sleep is the best option through CBT, and she is linked with Renda Pontes already.  We discussed that when she resumes her sessions with Renda that they discuss her insomnia. STOP BANG of 3 indicating high risk for OSA.  She was previously referred for a sleep study in the past, though did not have this completed and I think it is reasonable given her insomnia and obesity that we complete this.  Given her generalized daytime fatigue, it is also reasonable to assess for anemia, thyroid  function, iron studies and any vitamin deficiencies.  I like for her to follow-up in about 1 month for her routine annual follow-up and to address health maintenance care gaps, and at that time can check again in about her sleep hygiene and any changes. - Referral for Sleep Study - f/u CBC - f/u iron studies - f/u TSH - f/u Vitamin B12 - f/u Vitamin D 

## 2024-08-12 NOTE — Assessment & Plan Note (Signed)
 Patient has a history of dyslipidemia, and has not had her cholesterol checked in over a year and a half.  Last LDL from 11/2022 was 92.  She was previously hospitalized with a concern for TIA, making her LDL goal < 70.  We will obtain lipid panel today and if still not at goal then may increase rosuvastatin  20 mg daily to 40 mg daily. - Continue Rosuvastatin  20 mg daily  - f/u lipid panel; if LDL not at goal < 70, discuss increasing to Rosuvastatin  40 mg daily

## 2024-08-17 NOTE — Addendum Note (Signed)
 Addended by: Kamara Allan L on: 08/17/2024 11:08 AM   Modules accepted: Level of Service

## 2024-08-17 NOTE — Progress Notes (Signed)
Internal Medicine Clinic Attending  I was physically present during the key portions of the resident provided service and participated in the medical decision making of patient's management care. I reviewed pertinent patient test results.  The assessment, diagnosis, and plan were formulated together and I agree with the documentation in the resident's note.  Mercie Eon, MD

## 2024-08-24 ENCOUNTER — Telehealth: Payer: Self-pay

## 2024-08-24 ENCOUNTER — Encounter: Payer: Self-pay | Admitting: *Deleted

## 2024-08-24 NOTE — Telephone Encounter (Unsigned)
 Copied from CRM [id number]. Topic: Clinical - Medication Refill >> Aug 24, 2024  9:42 AM Rosaria A wrote: Medication: MOUNJARO  15 MG/0.5ML Pen  Has the patient contacted their pharmacy? Yes Pharmacy states that they sent in the refill request last week.   This is the patient's preferred pharmacy:  [id number] [address], [place] - [address] [place] [address] Phone: [phone] Fax: [phone]    Is this the correct pharmacy for this prescription? Yes   Has the prescription been filled recently? No  Is the patient out of the medication? Yes. Patient missed her medication last week and need to take it today.   Has the patient been seen for an appointment in the last year OR does the patient have an upcoming appointment? Yes  Can we respond through MyChart? No. Patient would like a phone call.   Agent: Please be advised that Rx refills may take up to 3 business days. We ask that you follow-up with your pharmacy.

## 2024-08-24 NOTE — Telephone Encounter (Signed)
 Copied from CRM [id number]. Topic: Clinical - Lab/Test Results >> Aug 24, 2024  9:48 AM Rosaria A wrote: Reason for CRM: Provided patient her lab results. Patients has questions regarding her LDL. Patient also has questions on upping her medication dosage.

## 2024-08-24 NOTE — Telephone Encounter (Signed)
I will send her a MyChart message

## 2024-08-25 ENCOUNTER — Telehealth: Payer: Self-pay | Admitting: *Deleted

## 2024-08-25 NOTE — Telephone Encounter (Signed)
 Will forward to [employer].                        Copied from CRM [id number]. Topic: Clinical - Lab/Test Results >> Aug 25, 2024  9:00 AM Diannia H wrote: Reason for CRM: Patient called and we went over her labs with understanding to everything but she is wanting further explanation of  (Your lipid panel was slightly elevated with an LDL of 107.) Could you assist? Patients callback number is [phone]

## 2024-08-25 NOTE — Telephone Encounter (Signed)
 Discussed results with patient today.  She decided to make dietary changes and lifestyle modifications and wants to recheck her lipid panel in 3 months.  She did not want to increase her rosuvastatin .  We can recheck lipid panel in 3 months.  I answered all of her questions.

## 2024-08-27 ENCOUNTER — Ambulatory Visit

## 2024-09-01 NOTE — Progress Notes (Signed)
 [name]                                          MRN: [id number]   09/01/2024   The [facility] Quality Team Specialist reviewed this patient medical record for the purposes of chart review for care gap closure. The following were reviewed: chart review for care gap closure-glycemic status assessment.    [facility] Quality Team

## 2024-09-09 ENCOUNTER — Ambulatory Visit
Admission: RE | Admit: 2024-09-09 | Discharge: 2024-09-09 | Disposition: A | Source: Ambulatory Visit | Attending: Internal Medicine

## 2024-09-09 DIAGNOSIS — Z1231 Encounter for screening mammogram for malignant neoplasm of breast: Secondary | ICD-10-CM

## 2024-09-16 ENCOUNTER — Telehealth: Payer: Self-pay | Admitting: Student

## 2024-09-16 NOTE — Telephone Encounter (Signed)
 Message has been forwarded to Renda Pontes to follow up with patient.   Copied from CRM #[id number]. Topic: Appointments - Scheduling Inquiry for Clinic >> Sep 16, 2024  8:18 AM Alfonso ORN wrote: Reason for CRM: patient request to speak with [facility] regarding scheduling an appointment

## 2024-09-17 NOTE — Progress Notes (Signed)
 [name]                                          MRN: [id number]   09/17/2024   The [facility] Quality Team Specialist reviewed this patient medical record for the purposes of chart review for care gap closure. The following were reviewed: chart review for care gap closure-glycemic status assessment.    [facility] Quality Team

## 2024-09-29 ENCOUNTER — Ambulatory Visit: Payer: Self-pay | Admitting: Licensed Clinical Social Worker

## 2024-10-13 ENCOUNTER — Institutional Professional Consult (permissible substitution): Admitting: Neurology
